# Patient Record
Sex: Female | Born: 1945
Health system: Southern US, Community
[De-identification: ages and names within clinical notes are randomized; demographics above are authoritative.]

## PROBLEM LIST (undated history)

## (undated) DIAGNOSIS — I5042 Chronic combined systolic (congestive) and diastolic (congestive) heart failure: Secondary | ICD-10-CM

## (undated) DIAGNOSIS — G629 Polyneuropathy, unspecified: Secondary | ICD-10-CM

## (undated) DIAGNOSIS — Z8674 Personal history of sudden cardiac arrest: Secondary | ICD-10-CM

## (undated) DIAGNOSIS — Z95810 Presence of automatic (implantable) cardiac defibrillator: Secondary | ICD-10-CM

## (undated) DIAGNOSIS — I10 Essential (primary) hypertension: Secondary | ICD-10-CM

## (undated) DIAGNOSIS — E7800 Pure hypercholesterolemia, unspecified: Secondary | ICD-10-CM

## (undated) DIAGNOSIS — M1990 Unspecified osteoarthritis, unspecified site: Secondary | ICD-10-CM

## (undated) DIAGNOSIS — Q249 Congenital malformation of heart, unspecified: Secondary | ICD-10-CM

## (undated) HISTORY — DX: Chronic combined systolic (congestive) and diastolic (congestive) heart failure: I50.42

---

## 1999-06-16 ENCOUNTER — Encounter: Admission: RE | Admit: 1999-06-16 | Discharge: 1999-06-16 | Payer: Self-pay | Admitting: Obstetrics and Gynecology

## 1999-06-16 ENCOUNTER — Encounter: Payer: Self-pay | Admitting: Obstetrics and Gynecology

## 2000-04-05 HISTORY — PX: CARDIOVASCULAR STRESS TEST: SHX262

## 2000-04-14 ENCOUNTER — Ambulatory Visit (HOSPITAL_COMMUNITY): Admission: RE | Admit: 2000-04-14 | Discharge: 2000-04-14 | Payer: Self-pay | Admitting: Cardiovascular Disease

## 2000-04-14 HISTORY — PX: CARDIAC CATHETERIZATION: SHX172

## 2000-06-23 ENCOUNTER — Encounter: Admission: RE | Admit: 2000-06-23 | Discharge: 2000-06-23 | Payer: Self-pay | Admitting: Obstetrics and Gynecology

## 2000-06-23 ENCOUNTER — Encounter: Payer: Self-pay | Admitting: Obstetrics and Gynecology

## 2000-12-09 ENCOUNTER — Other Ambulatory Visit: Admission: RE | Admit: 2000-12-09 | Discharge: 2000-12-09 | Payer: Self-pay | Admitting: Obstetrics and Gynecology

## 2001-05-13 ENCOUNTER — Encounter: Payer: Self-pay | Admitting: Internal Medicine

## 2001-05-13 ENCOUNTER — Ambulatory Visit (HOSPITAL_COMMUNITY): Admission: RE | Admit: 2001-05-13 | Discharge: 2001-05-13 | Payer: Self-pay | Admitting: Internal Medicine

## 2001-07-15 ENCOUNTER — Encounter: Admission: RE | Admit: 2001-07-15 | Discharge: 2001-07-15 | Payer: Self-pay | Admitting: Gynecology

## 2001-07-15 ENCOUNTER — Encounter: Payer: Self-pay | Admitting: Gynecology

## 2002-02-08 ENCOUNTER — Other Ambulatory Visit: Admission: RE | Admit: 2002-02-08 | Discharge: 2002-02-08 | Payer: Self-pay | Admitting: Gynecology

## 2002-08-18 ENCOUNTER — Encounter: Payer: Self-pay | Admitting: Gynecology

## 2002-08-18 ENCOUNTER — Encounter: Admission: RE | Admit: 2002-08-18 | Discharge: 2002-08-18 | Payer: Self-pay | Admitting: Gynecology

## 2002-10-11 ENCOUNTER — Encounter: Payer: Self-pay | Admitting: Family Medicine

## 2002-10-11 ENCOUNTER — Ambulatory Visit (HOSPITAL_COMMUNITY): Admission: RE | Admit: 2002-10-11 | Discharge: 2002-10-11 | Payer: Self-pay | Admitting: Family Medicine

## 2003-02-21 ENCOUNTER — Other Ambulatory Visit: Admission: RE | Admit: 2003-02-21 | Discharge: 2003-02-21 | Payer: Self-pay | Admitting: Gynecology

## 2003-06-21 ENCOUNTER — Inpatient Hospital Stay (HOSPITAL_COMMUNITY): Admission: EM | Admit: 2003-06-21 | Discharge: 2003-06-28 | Payer: Self-pay | Admitting: Emergency Medicine

## 2003-06-22 ENCOUNTER — Encounter (INDEPENDENT_AMBULATORY_CARE_PROVIDER_SITE_OTHER): Payer: Self-pay | Admitting: *Deleted

## 2003-06-22 HISTORY — PX: CARDIAC CATHETERIZATION: SHX172

## 2003-12-06 ENCOUNTER — Encounter: Admission: RE | Admit: 2003-12-06 | Discharge: 2003-12-06 | Payer: Self-pay | Admitting: Gynecology

## 2004-01-20 DIAGNOSIS — Z8674 Personal history of sudden cardiac arrest: Secondary | ICD-10-CM

## 2004-01-20 HISTORY — DX: Personal history of sudden cardiac arrest: Z86.74

## 2004-02-25 ENCOUNTER — Other Ambulatory Visit: Admission: RE | Admit: 2004-02-25 | Discharge: 2004-02-25 | Payer: Self-pay | Admitting: Gynecology

## 2004-08-24 ENCOUNTER — Emergency Department (HOSPITAL_COMMUNITY): Admission: EM | Admit: 2004-08-24 | Discharge: 2004-08-24 | Payer: Self-pay | Admitting: Emergency Medicine

## 2005-01-15 ENCOUNTER — Encounter: Admission: RE | Admit: 2005-01-15 | Discharge: 2005-01-15 | Payer: Self-pay | Admitting: Gynecology

## 2005-03-17 ENCOUNTER — Other Ambulatory Visit: Admission: RE | Admit: 2005-03-17 | Discharge: 2005-03-17 | Payer: Self-pay | Admitting: Gynecology

## 2006-01-18 ENCOUNTER — Encounter: Admission: RE | Admit: 2006-01-18 | Discharge: 2006-01-18 | Payer: Self-pay | Admitting: Gynecology

## 2006-11-29 ENCOUNTER — Ambulatory Visit (HOSPITAL_COMMUNITY): Admission: RE | Admit: 2006-11-29 | Discharge: 2006-11-29 | Payer: Self-pay | Admitting: Family Medicine

## 2007-02-01 ENCOUNTER — Encounter: Admission: RE | Admit: 2007-02-01 | Discharge: 2007-02-01 | Payer: Self-pay | Admitting: Gynecology

## 2008-02-02 ENCOUNTER — Encounter: Admission: RE | Admit: 2008-02-02 | Discharge: 2008-02-02 | Payer: Self-pay | Admitting: Gynecology

## 2008-07-27 ENCOUNTER — Ambulatory Visit (HOSPITAL_COMMUNITY): Admission: RE | Admit: 2008-07-27 | Discharge: 2008-07-27 | Payer: Self-pay | Admitting: Family Medicine

## 2008-08-20 ENCOUNTER — Encounter: Admission: RE | Admit: 2008-08-20 | Discharge: 2008-08-20 | Payer: Self-pay | Admitting: Orthopedic Surgery

## 2009-02-05 ENCOUNTER — Encounter: Admission: RE | Admit: 2009-02-05 | Discharge: 2009-02-05 | Payer: Self-pay | Admitting: Family Medicine

## 2009-06-03 ENCOUNTER — Ambulatory Visit (HOSPITAL_COMMUNITY): Admission: RE | Admit: 2009-06-03 | Discharge: 2009-06-03 | Payer: Self-pay | Admitting: Family Medicine

## 2010-02-11 ENCOUNTER — Encounter
Admission: RE | Admit: 2010-02-11 | Discharge: 2010-02-11 | Payer: Self-pay | Source: Home / Self Care | Attending: Family Medicine | Admitting: Family Medicine

## 2010-06-06 NOTE — Cardiovascular Report (Signed)
NAMEROSENDA, GEFFRARD NO.:  [id number]   MEDICAL RECORD NO.:  [id number]                   PATIENT TYPE:  INP   LOCATION:  2927                                 FACILITY:  [facility]   PHYSICIAN:  Madaline Savage, M.D.             DATE OF BIRTH:  1945-08-13   DATE OF PROCEDURE:  06/22/2003  DATE OF DISCHARGE:                              CARDIAC CATHETERIZATION   PROCEDURES PERFORMED:  1. Right heart catheterization.  2. Retrograde left heart catheterization.  3. Left ventricular angiography.  4. Thermal dilution cardiac output determinations.  5. Left ventricular angiography.   INTERVENTIONS:  None.   ENTRY SITE:  Right femoral.   DYE USED:  Omnipaque.   8 FRENCH SHEATH:  Venous.   6 FRENCH SHEATH:  Arterial.   PATIENT IDENTIFICATION:  The patient is a 65 year old married African-  American female who was in her usual state of health up until June 21, 2003,  when the patient was at work, developed a witnessed cardiac arrest at work  and was found to be in ventricular fibrillation, was defibrillated twice by  the emergency medical services and transported to the emergency room where  an EKG showed sinus rhythm with sinus tach, left anterior fascicular block  and QS patterns in V1 through V3 without ST segment elevation.  Initial  enzymes were negative, potassium was 2.6, glucose was 232.  The patient is  known to have diabetes, was known to have normal coronary arteries by  cardiac catheterization in 2002, and was said to have an ejection fraction  of 40%.  She was admitted and on June 22, 2003, prior to being transported to  the cardiac cath lab the patient was in her usual state of health without  complaints of shortness of breath, chest pain or ankle edema.  Her potassium  has since normalized and is now 4.2, her creatinine is normal.  This  procedure was performed on an inpatient basis electively without  complications.   RESULTS:   Pressures:  1. Left ventricular pressure 160/15, end-diastolic pressure 25-28, central     aortic pressure 160/70, mean of 110, right atrial pressure mean of 13, A-     wave 17, V-wave 14.  2. Right ventricular pressure 70/11, end-diastolic pressure 15, pulmonary     artery pressure 70/25, mean of 45.  Pulmonary capillary wedge mean     pressure 31, A-wave 32, V-wave 45.  3. FICK cardiac output 4.5, FICK cardiac index 2.0, thermal dilution cardiac     output 4.7, thermal cardiac index 2.1.   ANGIOGRAPHY RESULTS:  The patient's left main coronary artery was short and  normal.   The LAD was the dominant vessel in the circulation, giving rise to a large  bifurcating septal perforator branch, two small to medium-sized diagonal  branches arising from the mid third of the vessel.  After the apical LAD  wraps around the apex, it  gives rise to a posterior descending branch and  is, therefore, the dominant vessel with circulation.   The circumflex gives rise to a bifurcating intermediate ramus branch which  has a 40% stenosis proximally, the circumflex is normal.   The right coronary artery is nondominant and shows no evidence of any  lesions.  The left ventricle is dilated, it is globally hypokinetic, the  ejection fraction is 15%.  There is little to no mitral regurgitation.   FINAL DIAGNOSES:  1. Interval development of severe nonischemic cardiomyopathy since last     cardiac catheterization 2002.  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  2. Trivial single vessel coronary artery disease with a 40% stenosis in the     proximal intermediate ramus as the only lesion.  3. Moderate pulmonary hypertension.  4. Low cardiac output.   PLAN:  Diuresis, digitalization, Ace and beta blockade.  We will obtain an  EP consult for possibility of Bi-V ICD placement.                                               Madaline Savage, M.D.    WHG/MEDQ  D:  06/22/2003  T:  06/23/2003   Job:  [id number]   cc:   Nicki Guadalajara, M.D.  [phone] [address], [address]  [place], [place] [address]  Fax: [phone]   Cardiac Cath Lab   Patrica Duel, M.D.  [address], [address]  [place]  [place] [address]  Fax: [phone]

## 2010-06-06 NOTE — Discharge Summary (Signed)
NAME:  Diana Campbell, Diana Campbell NO.:  [id number]   MEDICAL RECORD NO.:  [id number]                   PATIENT TYPE:  INP   LOCATION:  2910                                 FACILITY:  [facility]   PHYSICIAN:  Diana Campbell, M.D.                  DATE OF BIRTH:  [birth year]   DATE OF ADMISSION:  06/21/2003  DATE OF DISCHARGE:  06/28/2003                                 DISCHARGE SUMMARY   ADMISSION DIAGNOSES:  1. Status post ventricular fibrillation cardiac arrest with subsequent CPR     and defibrillation to normal sinus rhythm.  2. History of cardiac catheterization on April 14, 2000, revealing     essentially normal coronary arteries and normal left ventricular     function.  3. Diabetes mellitus for approximately 20 years.  4. Hypertension.  5. History of hysterectomy.  6. History of cholecystectomy.  7. Obesity.  8. Hypokalemia.   DISCHARGE DIAGNOSES:  1. Status post ventricular fibrillation cardiac arrest with subsequent CPR     and defibrillation to normal sinus rhythm.  2. History of cardiac catheterization on April 14, 2000, revealing     essentially normal coronary arteries and normal left ventricular     function.  3. Diabetes mellitus for approximately 20 years.  4. Hypertension.  5. History of hysterectomy.  6. History of cholecystectomy.  7. Obesity.  8. Hypokalemia.  9. Status post cardiac catheterization on June 22, 2003, by Dr. Lavonne Campbell.     This revealed nonobstructive coronary artery disease. Ejection fraction     was 15% with global hypokinesis.  There is moderate pulmonary     hypertension and low cardiac output.  10.      Status post implantable cardioverter-defibrillator implantation by     Dr. Lewayne Campbell on June 25, 2003.  11.      Urinary tract infection.  12.      Hyperlipidemia.   HISTORY OF PRESENT ILLNESS:  Diana Campbell is a 65 year old African-American  female who presented to [facility] ER on June 21, 2003, after having a  ventricular fibrillation cardiac arrest and subsequent resuscitation with  CPR and defibrillation back to normal sinus rhythm.   She has a history of cardiac catheterization on April 14, 2000, which  revealed normal coronary arteries and normal LV function.  She states she  has been diabetic for approximately 10 years and has been treated for  hypertension for approximately 10 years.  She does not smoke but does use  snuff.  She checks her blood sugars regularly at home and does write them  down but could not remember how they run upon questioning in the emergency  room.   At the time in the emergency room, she could not remember much about the  events of the day. She remembers that she went to work that morning. 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  She  remembers going to work that  morning and remembers taking a break at which time she had some crackers and  a diet Sprite.  She thinks that she then went back to work after her break  but actually could not remember what type of work she was doing or any  interactions that she had since her break time.  She has absolutely no  memory of any episode.  She has no idea what happened to her between the  time she took her break at work until the time EMS brought her in to the  emergency room.  She does not remember feeling ill, feeling any chest pain,  palpitations, lightheadedness, etc.  She absolutely has no recollection of  what happened.  However, per EMS report, they state that the initial blood  sugar level was 233.  They cannot give a full report of exactly who found  her down or how long they thought she may have been down before they found  her.  Nonetheless, she was found by some other personnel at the school and  was found to be pulseless.  Apparently some firemen responded initially, and  CPR was started.  They reported that after CPR was started, she developed  some agonal breathing.  She then required defibrillation x 3 with  conversion  to sinus rhythm.  Sometime around there is when EMS actually arrived.  They  did bring in telemetry strips from where she had ventricular fibrillation  prior to the CPR and defibrillation.   On exam at the time in the ER, her heart rate was 76, blood pressure 167/79.  Lungs were clear.  Heart was in regular rhythm without murmur, rub, or  gallop.  She had no edema.  She had good peripheral pulses, no carotid  bruits.  At that time, she was alert and oriented x 3 but was slightly  confused and very confused about the incident, the episodes of the day and  what had taken place.   Telemetry strips reviewed from 1425 shows ventricular fibrillation.  The EKG  in the emergency room was showing sinus tachycardia at 107 beats per minute,  nonspecific changes, left anterior fascicular block.   Labs at that time included a hemoglobin of 11.8, white count 12.5, platelets  329.  INR 1.0.  Creatinine 0.9, potassium low at 2.6.  Oxygen saturation  95%.  Initial cardiac enzymes negative.   At this point, she was seen and evaluated by Diana Campbell.  He planned  to replete potassium.  We did not know whether her arrhythmia could have  been secondary to electrolyte imbalance or if her potassium was secondary to  defibrillation.  Nevertheless, we would now replete potassium level.  We  would start a low-dose beta blocker.  We would obtain a head CT to rule out  any kind of bleed or hemorrhage.  (We did not know if she fell at the time  of her event.)  As there was no indication of any bleed on CT, we would  start IV heparin.  We planned for elective cardiac catheterization the  following day and EP evaluation for questionable ICD.   CT of the brain reportedly showed stable small left density.  There was  probable old left posterior small infarct.  Reviewed with radiology and  agreed that heparin was safe.  Therefore, heparin was initiated.  HOSPITAL COURSE:  On June 22, 2003, she  remains stable.  EKG showed sinus  rhythm with no  further arrhythmias.  At that time she was stable.  We  planned for cardiac catheterization later that day.   On June 22, 2003, she underwent cardiac catheterization by Dr. Lavonne Campbell.  See the dictated report for details.  She was found to have nonobstructive  CAD.  There was 40% stenosis in the ramus, and otherwise the rest of the  vessel showed no stenosis.  Her EF was 15% with global hypokinesis.  As  well, there was some moderate pulmonary hypertension.  She tolerated  procedure well without complications.  Dr. Elsie Lincoln planned for diuresis,  digoxin, ACE inhibitor, beta blocker.  We would plan for EP consult for  possible ICD.   On June 22, 2003, she was seen in EP evaluation.  It was reviewed that she  had a ventricular fibrillation arrest.  As well, she had a borderline QT and  nonischemic cardiomyopathy with EF approximately 15%, decreased from 60% in  2002.  As well, she had a narrow QRS.  It was also reviewed that there was  no family history of sudden cardiac death.  Because she had a narrow QRS,  this precludes biventricular implant.  Therefore, they planned to proceed  with ICD implantation which would be scheduled for the upcoming Monday.   Over the next couple of days, Ms. Freyre remained stable.  We adjusted  medications for cardiomyopathy.  She had no further arrhythmias and remained  stable.   On June 25, 2003, she underwent ICD implantation by Dr. Lewayne Campbell.  See  his dictated report for detail.  This is a Medtronic device.   Upon followup interrogation, the ICD appeared to be functioning  appropriately, and all values were within normal limits.  Followup chest x-  ray showed no pneumothorax, and device appeared to be in normal position.   Over the next day she remained stable with no significant problems.   On June 28, 2003.  She is felt to be stable for discharge home.  At this  point, she is without complaint.   Her temperature is 97.8, pulse 60s to 70s,  blood pressure 120s/50.  Oxygen saturation 97% on room air.  Magnesium level  is at 2.2.  Potassium 4, creatinine 0.9.  Other labs are stable.  She is  maintaining normal sinus rhythm with no further ventricular arrhythmias.  She has had some PVCs but no runs of NSVT.  At this point, she is thought to  be stable for discharge home.  She has also been seen by Dr. Lewayne Campbell  who deems her stable for discharge home as well.   CONSULTATIONS:  Electrophysiology consultation.  Initially seen by Duke Salvia, M.D. but then later by Doylene Canning. Ladona Ridgel, M.D.  they were consulting  regarding ventricular fibrillation arrest.   PROCEDURES:  1. Cardiac catheterization on June 22, 2003, by Dr. Lavonne Campbell.  This     revealed an obstructive coronary artery disease, EF 15%, with global     hypokinesis.  2. ICD implantation on June 25, 2003, by Dr. Lewayne Campbell.  This is a     Medtronic device.  LABORATORY AND X-RAY DATA:  Head CT on June 21, 2003, shows two small low-  density areas, one of which appears to be stable on the left insula area and  another which again appears to be old but is located in the left posterior  parietal area in the periphery of the grey matter.  No hemorrhage or masses  seen.  Chest x-ray on June 2 shows mild cardiomegaly, generalized peribronchial  thickening, slightly more large on the right than the left.   Chest x-ray June 3 shows poor inspiration with no significant change,  cardiomegaly, pulmonary vascular congestion, and mild chronic interstitial  lung disease.   Chest x-ray June 7 shows 1) AICD in appropriate position.  No evidence of  pneumothorax.  2) No active disease.   Laboratories on admission, white count 12.5, hemoglobin 11.8, hematocrit  36.0, platelets 329.  At that time, INR 1.0, PTT 27, normal.  In the ER at  that time, sodium 139, potassium 2.6 which was repleted, BUN 9, creatinine  0.9.  Cardiac  markers negative.  Followup potassium June 3 at 0024 is 4.4.  Cardiac panel on June 3 shows CK 306, MB 8.1, troponin 0.30.  Next set shows  CK 329, MB 8.1, troponin 0.27.  BNP level 839.0.  TSH normal at 2.2.  Urinalysis shows moderate leukocytes.  She subsequently was started on  ciprofloxacin.  Urine cultures at time of discharge had shown no growth thus  far.  At time of discharge home on June 9, labs showed CBC with white count  11.2, hemoglobin 12.3, hematocrit 37.5, platelets 296.  Sodium 139,  potassium 4.4, glucose 115, BUN 12, creatinine 0.9.  Magnesium 2.2.   DISCHARGE MEDICATIONS:  1. Coreg 3.125 mg twice a day.  2. Captopril 50 mg twice a day.  3. Digoxin 0.25 mg once a day.  4. Lipitor 20 mg once a day.  5. Aldactone 25 mg each morning.  6. Novolin 70/30 as before.  7. Cipro 100 mg twice a day for 3 days.   DISCHARGE INSTRUCTIONS:  1. She was given an ICD instruction sheet by EP, and this was reviewed with     her prior to her discharge.  2. She is to have blood in one week to check a BMET as she will be on     increased doses of ACE and Aldactone.  3. We did call and make an appointment for her to see Dr. Lewayne Campbell back     in the office, and I did write that appointment on her pink piece of     paper, but I did not get it copied over to my copy.  She did have an     appointment made, and she does have his phone number to contact him in     case she has any problems with the ICD, diet, or questions about the ICD.  4. She has an appointment to see Dr. Tresa Endo in the [place] office on     Thursday, June 30, at 2:30 p.m.      Mary B. Easley, P.A.-C.                   Diana Campbell, M.D.    MBE/MEDQ  D:  06/28/2003  T:  06/29/2003  Job:  [id number]   cc:   Doylene Canning. Ladona Ridgel, M.D.   Patrica Duel, M.D.  [address], [address]  [place]  [place] [address]  Fax: [phone]

## 2010-06-06 NOTE — Op Note (Signed)
NAME:  AVILYN, VIRTUE NO.:  [id number]   MEDICAL RECORD NO.:  [id number]                   PATIENT TYPE:  INP   LOCATION:  2927                                 FACILITY:  [facility]   PHYSICIAN:  Doylene Canning. Ladona Ridgel, M.D.               DATE OF BIRTH:  11-01-1945   DATE OF PROCEDURE:  06/25/2003  DATE OF DISCHARGE:                                 OPERATIVE REPORT   PROCEDURE PERFORMED:  Insertion of a single chamber pacemaker.   INDICATIONS FOR PROCEDURE:  Resuscitated VF arrest in the setting of a  nonischemic cardiomyopathy, ejection fraction 15%.   INTRODUCTION:  The patient is a 65 year old woman with a history of a normal  left ventricular function, normal coronary arteries back in March of 2002.  She has a history of diabetes and hypertension.  The patient was admitted to  the hospital after she developed a ventricular fibrillation arrest at work.  She was resuscitated and admitted and underwent evaluation.  The patient  underwent repeat catheterization demonstrating nonobstructive coronary  disease and an ejection fraction of 15% with minimal mitral regurgitation.  She is now referred for implantable cardioverter-defibrillator insertion.  Note that the patient's QRS duration is 129 msec but her heart failure is  presently class 1.   DESCRIPTION OF PROCEDURE:  After informed consent was obtained, the patient  was taken to the diagnostic electrophysiology laboratory in a fasting state.  After the usual preparation and draping, intravenous fentanyl and Midazolam  were given for sedation.  A total of 30 mL of lidocaine was infiltrated into  the left infraclavicular region. A 9 cm incision was carried out over this  region and electrocautery utilized to dissect down to the fascial plane.  The subclavian vein was punctured times one and the Medtronic model 6949 65  cm active fixation defibrillation lead serial number [id number] was advanced  into the right  ventricle.  Mapping was carried out in the right ventricle  and at the final site the R-waves measured 10 mV with a pacing threshold of  0.4 V at 0.5 msec and a pacing impedance of 666 ohms.  10 V pacing did not  stimulate the diaphragm.  With the defibrillation lead in satisfactory  position, it was secured to the subpectoralis fascia with a figure-of-eight  silk suture.  In addition, a sewing sleeve was secured with silk suture.  Electrocautery was utilized to fashion a subcutaneous pocket.  The Medtronic  [place] model O152772, serial [id number] was connected to the defibrillation  lead and placed back in the subcutaneous pocket.  The generator was secured  with a silk suture.  Kanamycin irrigation was utilized to irrigate the  incision and defibrillation threshold testing carried out.  VF was induced  with a T-wave shock.  Initially a 15 joule shock was delivered, which  successfully terminated ventricular fibrillation and restored sinus rhythm.  Five minutes was allowed to elapse  and a second defibrillation threshold  testing carried out.  Again ventricular fibrillation was induced with a T-  wave shock and an 8 joule shock was delivered, terminating ventricular  fibrillation and restoring sinus rhythm.  At this point additional Kanamycin  was utilized to irrigate the incision and the incision then closed with a  layer of 2-0 Vicryl, followed by a layer of 3-0 Vicryl followed by a layer  of 4-0 Vicryl.  Benzoin was painted on the skin.  Steri-Strips were applied.  Pressure dressing placed and the patient returned to her room in  satisfactory condition.   COMPLICATIONS:  There were no immediate procedure complications.   RESULTS:  This demonstrates successful insertion of a Medtronic single  chamber pacemaker in a patient with a spontaneous ventricular fibrillation  arrest with successful resuscitation in the setting of a nonischemic  cardiomyopathy.                                                Doylene Canning. Ladona Ridgel, M.D.    GWT/MEDQ  D:  06/25/2003  T:  06/25/2003  Job:  [id number]   cc:   Patrica Duel, M.D.  [address], [address]  [place]  [place] [address]  Fax: [phone]   Nicki Guadalajara, M.D.  [address]  [place], [place] [address]  Fax: [phone]   Nanetta Batty, M.D.  Fax: [phone]

## 2010-06-06 NOTE — Cardiovascular Report (Signed)
[place]  Patient:    Diana Campbell, Diana Campbell                     MRN: [id number] Proc. Date: 04/14/00 Adm. Date:  [id number] Disc. Date: [id number] Attending:  Virgina Evener CC:         Avie Arenas, M.D.   Cardiac Catheterization  INDICATIONS:  [name] is a 65 year old, black female, with a history of tobacco abuse and has been dipping snuff since less than age 49. She had recently noticed chest discomfort.  She has a history of significant hypertension as well as diabetes mellitus.  A Cardiolite myocardial perfusion study showed mild LV dilation with a suggestion of mild global LV dysfunction with an EF of 41%, and suggestion of borderline infralateral ischemia.  The patient is now referred for definitive cardiac catheterization.  PROCEDURES PERFORMED:  Left heart catheterization, cine coronary angiography, and distal aortography.  ANGIOGRAPHIC DATA:  Left main coronary artery:  The left main coronary artery was angiographically normal and bifurcated into an LAD and large circumflex system.  Left anterior descending:  The LAD was a large vessel that wrapped around the apex.  There appeared to be a normal variant in that the PDA and PLA system seemed to rise from the apical portion of the LAD and seemed to extend to the acute margin.  There was no significant obstruction.  Circumflex artery:  The circumflex coronary artery gave rise to a very high marginal vessel that had a ramus intermediate like distribution.  The circumflex and its marginal vessel was angiographically normal.  Right coronary artery:  The right coronary artery was a nondominant vessel. This vessel gave rise to a small anterior RV marginal branch and then seemed to end in an acute marginal branch.  Several views did not seem to demonstrate any occlusion of the RCA, but there were several branches off the anterior marginal which seemed to  supple the lateral wall.  As noted above, the PDA and PLA system seemed to arise from the LAD and left coronary system.  Biplane cinearteriography revealed a mild global hypocontractility.  There was a suggestion of subtle apical contractility, calculated ejection fraction, however, measured at 69% which seemed increased from the visual appearance which was more in the 50 to less than 55% range.  There was normal contractility seen on the LAO ventriculogram.  Because of the patients significant hypertensive history, distal aortography was performed.  This did not demonstrate any evidence for significant renovascular or aortoiliac disease.  IMPRESSION: 1. Low-normal left ventricular function. 2. Normal coronary arteries with a variant in that the posterior descending    artery and posterolateral artery system seemed to rise from the apical    portion of the left anterior descending. 3. Nondominant right coronary artery. DD:  04/14/00 TD:  04/15/00 Job: [id number]

## 2010-07-20 DIAGNOSIS — Z95810 Presence of automatic (implantable) cardiac defibrillator: Secondary | ICD-10-CM

## 2010-07-20 HISTORY — DX: Presence of automatic (implantable) cardiac defibrillator: Z95.810

## 2010-07-21 ENCOUNTER — Ambulatory Visit (HOSPITAL_COMMUNITY)
Admission: RE | Admit: 2010-07-21 | Discharge: 2010-07-22 | Disposition: A | Discharge status: Home / Self Care | Payer: Medicare Other | Source: Ambulatory Visit | Attending: Cardiovascular Disease | Admitting: Cardiovascular Disease

## 2010-07-21 ENCOUNTER — Ambulatory Visit (HOSPITAL_COMMUNITY)
Admission: RE | Admit: 2010-07-21 | Discharge: 2010-07-21 | Disposition: A | Discharge status: Home / Self Care | Payer: Medicare Other | Source: Ambulatory Visit | Attending: Cardiovascular Disease | Admitting: Cardiovascular Disease

## 2010-07-21 ENCOUNTER — Other Ambulatory Visit (HOSPITAL_COMMUNITY): Payer: Self-pay | Admitting: Cardiovascular Disease

## 2010-07-21 DIAGNOSIS — I428 Other cardiomyopathies: Secondary | ICD-10-CM | POA: Insufficient documentation

## 2010-07-21 DIAGNOSIS — I499 Cardiac arrhythmia, unspecified: Secondary | ICD-10-CM

## 2010-07-21 DIAGNOSIS — I2510 Atherosclerotic heart disease of native coronary artery without angina pectoris: Secondary | ICD-10-CM | POA: Insufficient documentation

## 2010-07-21 DIAGNOSIS — Y831 Surgical operation with implant of artificial internal device as the cause of abnormal reaction of the patient, or of later complication, without mention of misadventure at the time of the procedure: Secondary | ICD-10-CM | POA: Insufficient documentation

## 2010-07-21 DIAGNOSIS — Z8674 Personal history of sudden cardiac arrest: Secondary | ICD-10-CM | POA: Insufficient documentation

## 2010-07-21 DIAGNOSIS — E785 Hyperlipidemia, unspecified: Secondary | ICD-10-CM | POA: Insufficient documentation

## 2010-07-21 DIAGNOSIS — T82198A Other mechanical complication of other cardiac electronic device, initial encounter: Secondary | ICD-10-CM | POA: Insufficient documentation

## 2010-07-21 DIAGNOSIS — I10 Essential (primary) hypertension: Secondary | ICD-10-CM | POA: Insufficient documentation

## 2010-07-21 DIAGNOSIS — Z794 Long term (current) use of insulin: Secondary | ICD-10-CM | POA: Insufficient documentation

## 2010-07-21 DIAGNOSIS — E119 Type 2 diabetes mellitus without complications: Secondary | ICD-10-CM | POA: Insufficient documentation

## 2010-07-21 HISTORY — PX: CARDIAC DEFIBRILLATOR PLACEMENT: SHX171

## 2010-07-21 LAB — BASIC METABOLIC PANEL
BUN: 22 mg/dL (ref 6–23)
Calcium: 9.8 mg/dL (ref 8.4–10.5)
Creatinine, Ser: 1.11 mg/dL — ABNORMAL HIGH (ref 0.50–1.10)
GFR calc Af Amer: 60 mL/min — ABNORMAL LOW (ref >60)
GFR calc non Af Amer: 49 mL/min — ABNORMAL LOW (ref >60)

## 2010-07-21 LAB — CBC
MCHC: 32.9 g/dL (ref 30.0–36.0)
Platelets: 310 10*3/uL (ref 150–400)
RDW: 14.1 % (ref 11.5–15.5)

## 2010-07-21 LAB — HEMOGLOBIN A1C
Hgb A1c MFr Bld: 8.2 % — ABNORMAL HIGH (ref <5.7)
Mean Plasma Glucose: 189 mg/dL — ABNORMAL HIGH (ref <117)

## 2010-07-21 LAB — GLUCOSE, CAPILLARY
Glucose-Capillary: 238 mg/dL — ABNORMAL HIGH (ref 70–99)
Glucose-Capillary: 202 mg/dL — ABNORMAL HIGH (ref 70–99)

## 2010-07-21 LAB — SURGICAL PCR SCREEN: MRSA, PCR: NEGATIVE

## 2010-07-22 ENCOUNTER — Ambulatory Visit (HOSPITAL_COMMUNITY): Payer: Medicare Other

## 2010-07-22 LAB — GLUCOSE, CAPILLARY: Glucose-Capillary: 171 mg/dL — ABNORMAL HIGH (ref 70–99)

## 2010-07-26 NOTE — Discharge Summary (Signed)
  Diana Campbell, SENEGAL NO.:  [id number]  MEDICAL RECORD NO.:  [id number]  LOCATION:                                 FACILITY:  PHYSICIAN:  Thurmon Fair, MD     DATE OF BIRTH:  [birth year]  DATE OF ADMISSION:  07/21/2010 DATE OF DISCHARGE:  07/22/2010                              DISCHARGE SUMMARY   DISCHARGE DIAGNOSES: 1. ICD change this admission. 2. Ventricular fibrillation arrest in 2005 treated with an ICD with     Sprint Fidelis lead. 3. Nonischemic cardiomyopathy in 2005 with an ejection fraction of     less than 20%, subsequent echocardiogram March 2011 showing normal     left ventricular function. 4. Mild coronary disease in 2005 with a 40% circumflex stenosis. 5. Type 2 insulin-dependent diabetes. 6. Treated hypertension. 7. Treated dyslipidemia.  HOSPITAL COURSE:  The patient is a 65 year old female who was seen in the office by Dr. Royann Shivers after she called saying that her ICD was beeping.  Dr. Royann Shivers evaluated her in the office.  She had a VF arrest in 2005.  She was found to have a nonischemic cardiomyopathy at that time.  She had an ICD implanted then with a Sprint Fidelis class I advisory [phone] lead.  She has not had any ICD shocks.  She has had no recurrent syncope.  Followup ICD checks have been normal.  Dr. Royann Shivers felt we should go ahead and change her ICD out.  She was admitted electively July 21, 2010.  She did undergo generator change with a Medtronic device as well as a lead change.  The original Sprint Fidelis lead was capped. Postprocedure she did well.  She had a normal device function.  Chest x- ray showed no pneumothorax.  She does have a small hematoma at the ICD site.  She will see Dr. Royann Shivers in a week in the office in followup.  LABORATORY DATA:  Sodium 138, potassium 4.6, BUN 22, creatinine 1.11, hemoglobin A1c is 8.2.  TSH 1.77, INR 1.03, white count 11.2, hemoglobin 11.3, hematocrit 34.3, platelets 310,000.  Chest  x-ray shows mild cardiomegaly, no pneumothorax.  Telemetry is paced.  DISPOSITION:  The patient is discharged in stable condition.  She will follow up with Dr. Royann Shivers in 1 week for a site check.  Please refer to med rec for complete discharge medications.  She knows to contact us if her hematoma expands or if she develops any drainage from pacer site.     Abelino Derrick, P.A.   ______________________________ Thurmon Fair, MD    LKK/MEDQ  D:  07/22/2010  T:  07/22/2010  Job:  [id number]  cc:   Thurmon Fair, MD [facility]  Electronically Signed by Corine Shelter P.A. on 07/24/2010 12:01:29 PM Electronically Signed by Thurmon Fair M.D. on 07/26/2010 08:19:03 AM

## 2010-07-26 NOTE — Op Note (Signed)
Diana Campbell, Diana Campbell NO.:  [id number]  MEDICAL RECORD NO.:  [id number]  LOCATION:  6529                         FACILITY:  [facility]  PHYSICIAN:  Thurmon Fair, MD     DATE OF BIRTH:  03/13/1945  DATE OF PROCEDURE: DATE OF DISCHARGE:                              OPERATIVE REPORT   PROCEDURES PERFORMED: 1. Removal of old defibrillator generator. 2. Capping of chronic right ventricular defibrillator lead. 3. Implantation of new dual-chamber defibrillator system (new atrial     and ventricular leads and new generator).  Initial lead and     generator testing and defibrillation threshold testing. 4. Moderate sedation. 5. Fluoroscopy. 6. Left upper extremity venogram.  REASON FOR PROCEDURE: 1. History of previous cardiac arrest (initial implantation of     defibrillator was for secondary prevention). 2. Nonischemic cardiomyopathy. 3. Fractured chronic defibrillator lead (Medtronic Sprint Fidelis     lead, subject to recall).  Diana Campbell is a 65 year old woman who survived sudden cardiac arrest and subsequently had implantation of a single-chamber defibrillator. She has a Insurance account manager Fidelis lead, subject to recall.  Low impedance alerts alarmed and when the patient presented to the office the impedance was greater than 3000 ohms.  Tachycardia detection and therapies were turned off and the patient was referred for implantation of a new defibrillator system.  Diana Campbell is now older and has developed significant bradycardia partly due to necessary medications for treatment of cardiomyopathy and hypertension.  She now meets criteria for implantation of a dual-chamber.  PROCEDURE PERFORMED:  By Thurmon Fair, MD  ASSISTANT:  Derrick Ravel.  MEDICATIONS ADMINISTERED:  Ancef 1 g intravenously, Versed 5 mg intravenously, fentanyl 200 mcg intravenously, lidocaine 1% 30 mL locally, and Romazicon 0.5 mg intravenously.  DEVICE DETAILS:  The chronic  explanted generator was a Medtronic Maximo VR, serial number [id number], implanted June 25, 2003.  The chronic abandoned right ventricular lead is a Medtronic Sprint Fidelis  [id number], serial number [id number] implanted on the same date.  The new defibrillator generator is a Medtronic Protecta [id number], serial number [id number].  The new atrial lead is a Medtronic 5076 - 52 cm, serial number [id number].  The new defibrillator lead is a Oncologist DXAC - DSP, model number 6947 - 65 cm, serial number [id number].  COMPLICATIONS:  None.  The patient did require partial reversal of sedation following defibrillation threshold testing.  ESTIMATED BLOOD LOSS:  Less than 10 mL.  The risks and benefits of the procedure were discussed, the patient provided informed consent and was brought to the cardiac cath lab in the fasting state.  She was prepped and draped in usual sterile fashion and local anesthesia 1% lidocaine was administered to the left prepectoral area in the area of the previous scar.  A 6-cm horizontal incision was made parallel with the inferior border of the left clavicle and using limited electrocautery and mostly blunt dissection, the pocket was opened and the chronic generator was explanted.  The defibrillator leads were capped.  The chronic defibrillator lead tips were capped.  Under fluoroscopic guidance using two separate venipunctures and the modified Seldinger technique, two  separate J-tipped guidewires were placed in left subclavian vein.  These were then exchanged for two separate SafeSheaths 8-French and 7-French respectively.  Under fluoroscopic guidance, the ventricular lead was advanced to the level of the mid to apical right ventricular septum.  The active fixation helix was deployed.  There was moderate to prominent current of injury.  Satisfactory sensing and pacing parameters were recorded. There was no evidence of diaphragmatic  stimulation at maximum device output.  The SafeSheath was peeled away and the lead was secured in place using 2-0 silk.  The atrial lead was advanced to the level of the right atrial appendage and the active fixation helix was deployed.  Prominent current of injury was seen.  There was no evidence of diaphragmatic stimulation at maximum device output.  Sensing and pacing threshold were satisfactory.  The SafeSheath was peeled away.  The lead was secured in place using 2-0 silk.  The pocket was flushed with copious amounts of antibiotic solution and reinspected for hemostasis which was found to be excellent.  The new leads were connected to the new generator.  Appropriate ventricular pacing and atrial paced/ventricular sensed rhythm were noted.  The generator was then placed in the pocket with care being taken that the entire system assume a comfortable position without tension on the incision margins.  The chronic defibrillator capped leads as well as the new leads were coiled behind the generator.  The pocket was then closed in layers using two layers of 2-0 silk and cutaneous staples.  A sterile dressing was applied.  Defibrillation threshold testing was then performed.  Ventricular fibrillation was successfully induced with a single 1.2 joules shock-on- T procedure.  The device showed appropriate detection without any dropouts.  The charge time was only 1.8 seconds.  A 10-joule shock was delivered and converted the rhythm immediately to a normal sinus rhythm. The total duration of fibrillation was 5 seconds.  The emergent rhythm was actually atrial paced ventricular sensed.  A small amount of Romazicon was administered due to excessive sedation following defibrillation testing.  The patient was immediately alert, oriented and never did show significant hypoxia.  At the end of the procedure, the following electronic parameters were encountered:  Right atrium sensed P-waves 2.1  mV, impedance 513 ohms, threshold 1 volt at 0.4 milliseconds pulse width.  Right ventricle sensed R-waves 9.6 mV, impedance 589 ohms, threshold 0.75 volts at 0.4 milliseconds pulse width.  High-voltage impedance was 47 ohms for the distal coil and 56 ohms for the proximal coil.     Thurmon Fair, MD     MC/MEDQ  D:  07/21/2010  T:  07/22/2010  Job:  [id number]  cc:   [facility]  Electronically Signed by Thurmon Fair M.D. on 07/26/2010 08:18:52 AM

## 2011-01-15 ENCOUNTER — Other Ambulatory Visit: Payer: Self-pay | Admitting: Family Medicine

## 2011-01-15 DIAGNOSIS — Z1231 Encounter for screening mammogram for malignant neoplasm of breast: Secondary | ICD-10-CM

## 2011-02-18 ENCOUNTER — Ambulatory Visit
Admission: RE | Admit: 2011-02-18 | Discharge: 2011-02-18 | Disposition: A | Discharge status: Home / Self Care | Payer: Medicare Other | Source: Ambulatory Visit | Attending: Family Medicine | Admitting: Family Medicine

## 2011-02-18 DIAGNOSIS — Z1231 Encounter for screening mammogram for malignant neoplasm of breast: Secondary | ICD-10-CM

## 2011-03-03 ENCOUNTER — Other Ambulatory Visit (HOSPITAL_COMMUNITY): Payer: Self-pay | Admitting: Family Medicine

## 2011-03-03 DIAGNOSIS — Z139 Encounter for screening, unspecified: Secondary | ICD-10-CM

## 2011-03-10 ENCOUNTER — Ambulatory Visit (HOSPITAL_COMMUNITY)
Admission: RE | Admit: 2011-03-10 | Discharge: 2011-03-10 | Disposition: A | Discharge status: Home / Self Care | Payer: Medicare Other | Source: Ambulatory Visit | Attending: Family Medicine | Admitting: Family Medicine

## 2011-03-10 DIAGNOSIS — M899 Disorder of bone, unspecified: Secondary | ICD-10-CM | POA: Insufficient documentation

## 2011-03-10 DIAGNOSIS — Z780 Asymptomatic menopausal state: Secondary | ICD-10-CM | POA: Insufficient documentation

## 2011-03-10 DIAGNOSIS — Z139 Encounter for screening, unspecified: Secondary | ICD-10-CM

## 2011-03-10 DIAGNOSIS — Z13820 Encounter for screening for osteoporosis: Secondary | ICD-10-CM | POA: Insufficient documentation

## 2011-03-12 DIAGNOSIS — E119 Type 2 diabetes mellitus without complications: Secondary | ICD-10-CM | POA: Insufficient documentation

## 2011-03-19 NOTE — H&P (Signed)
  NTS SOAP Note  Vital Signs:  Vitals as of: 03/19/2011: Systolic 113: Diastolic 57: Heart Rate 71: Temp 96.53F: Height 21ft 7.5in: Weight 213Lbs 0 Ounces: OFC 0in: Respiratory Rate 0: O2 Saturation 0: Pain Level 0: BMI 33  BMI : 32.87 kg/m2  Subjective: This 66 Years 9 Months old Female presents forscreening TCS.  Denies GI complaints.  No family h/o colon cancer.  Review of Symptoms:  Constitutional:unremarkable Head:unremarkable Eyes:unremarkable sinus Cardiovascular:unremarkable Respiratory:unremarkable Gastrointestinal:unremarkable Genitourinary:unremarkable Musculoskeletal:unremarkable Skin:unremarkable Hematolgic/Lymphatic:unremarkable Allergic/Immunologic:unremarkable   Past Medical History:Reviewed   Past Medical History  Surgical History: defibrillator placement, hysterectomy, cholecystectomy Medical Problems:  Diabetes, High Blood pressure, High cholesterol Allergies: nkda Medications: selsun, baby ASA, captopril, lasix, atorvastatin, januvia, coreg   Social History:Reviewed   Social History  Preferred Language: English ([place]) Race:  Black or African American Ethnicity: Not Hispanic / Latino Age: 66 Years 3 Months Marital Status:  M   Smoking Status: Unknown if ever smoked  Family History:Reviewed   Family History  Is there a family history of:no family h/o colon cancer    Objective Information: General:Well appearing, well nourished in no distress. Heart:RRR, no murmur or gallop.  Normal S1, S2.  No S3, S4.  Lungs:CTA bilaterally, no wheezes, rhonchi, rales.  Breathing unlabored. Abdomen:Soft, NT/ND, no HSM, no masses. deferred to procedure  Assessment:Need for screening TCS  Diagnosis &amp; Procedure: DiagnosisCode: V76.51, ProcedureCode: 16109,    Plan:Scheduled for TCS on 03/30/11.   Patient Education:Alternative treatments to surgery were discussed with  patient (and family).Risks and benefits  of procedure were fully explained to the patient (and family) who gave informed consent. Patient/family questions were addressed.  Follow-up:Pending Surgery

## 2011-03-23 ENCOUNTER — Other Ambulatory Visit: Payer: Self-pay

## 2011-03-23 ENCOUNTER — Emergency Department (HOSPITAL_COMMUNITY): Payer: Medicare Other

## 2011-03-23 ENCOUNTER — Emergency Department (HOSPITAL_COMMUNITY)
Admission: EM | Admit: 2011-03-23 | Discharge: 2011-03-23 | Disposition: A | Discharge status: Home / Self Care | Payer: Medicare Other | Attending: Emergency Medicine | Admitting: Emergency Medicine

## 2011-03-23 ENCOUNTER — Encounter (HOSPITAL_COMMUNITY): Payer: Self-pay | Admitting: Emergency Medicine

## 2011-03-23 DIAGNOSIS — M545 Low back pain, unspecified: Secondary | ICD-10-CM | POA: Insufficient documentation

## 2011-03-23 DIAGNOSIS — N289 Disorder of kidney and ureter, unspecified: Secondary | ICD-10-CM

## 2011-03-23 DIAGNOSIS — S301XXA Contusion of abdominal wall, initial encounter: Secondary | ICD-10-CM | POA: Insufficient documentation

## 2011-03-23 DIAGNOSIS — Y92410 Unspecified street and highway as the place of occurrence of the external cause: Secondary | ICD-10-CM | POA: Insufficient documentation

## 2011-03-23 DIAGNOSIS — R42 Dizziness and giddiness: Secondary | ICD-10-CM | POA: Insufficient documentation

## 2011-03-23 DIAGNOSIS — I2510 Atherosclerotic heart disease of native coronary artery without angina pectoris: Secondary | ICD-10-CM | POA: Insufficient documentation

## 2011-03-23 DIAGNOSIS — R079 Chest pain, unspecified: Secondary | ICD-10-CM | POA: Insufficient documentation

## 2011-03-23 DIAGNOSIS — S39012A Strain of muscle, fascia and tendon of lower back, initial encounter: Secondary | ICD-10-CM

## 2011-03-23 DIAGNOSIS — S20219A Contusion of unspecified front wall of thorax, initial encounter: Secondary | ICD-10-CM | POA: Insufficient documentation

## 2011-03-23 DIAGNOSIS — I517 Cardiomegaly: Secondary | ICD-10-CM | POA: Insufficient documentation

## 2011-03-23 DIAGNOSIS — R109 Unspecified abdominal pain: Secondary | ICD-10-CM | POA: Insufficient documentation

## 2011-03-23 DIAGNOSIS — S339XXA Sprain of unspecified parts of lumbar spine and pelvis, initial encounter: Secondary | ICD-10-CM | POA: Insufficient documentation

## 2011-03-23 DIAGNOSIS — K449 Diaphragmatic hernia without obstruction or gangrene: Secondary | ICD-10-CM | POA: Insufficient documentation

## 2011-03-23 HISTORY — DX: Essential (primary) hypertension: I10

## 2011-03-23 HISTORY — DX: Pure hypercholesterolemia, unspecified: E78.00

## 2011-03-23 HISTORY — DX: Congenital malformation of heart, unspecified: Q24.9

## 2011-03-23 LAB — URINALYSIS, MICROSCOPIC ONLY
Bilirubin Urine: NEGATIVE
Glucose, UA: NEGATIVE mg/dL
Hgb urine dipstick: NEGATIVE
Ketones, ur: NEGATIVE mg/dL
Protein, ur: NEGATIVE mg/dL

## 2011-03-23 LAB — COMPREHENSIVE METABOLIC PANEL
ALT: 11 U/L (ref 0–35)
AST: 17 U/L (ref 0–37)
Alkaline Phosphatase: 87 U/L (ref 39–117)
CO2: 28 mEq/L (ref 19–32)
Calcium: 11.3 mg/dL — ABNORMAL HIGH (ref 8.4–10.5)
Potassium: 4.4 mEq/L (ref 3.5–5.1)
Sodium: 138 mEq/L (ref 135–145)
Total Protein: 8.3 g/dL (ref 6.0–8.3)

## 2011-03-23 LAB — CBC
HCT: 36.3 % (ref 36.0–46.0)
Hemoglobin: 11.5 g/dL — ABNORMAL LOW (ref 12.0–15.0)
MCH: 24.5 pg — ABNORMAL LOW (ref 26.0–34.0)
MCHC: 31.7 g/dL (ref 30.0–36.0)
RDW: 14.4 % (ref 11.5–15.5)

## 2011-03-23 LAB — POCT I-STAT, CHEM 8
Calcium, Ion: 1.32 mmol/L (ref 1.12–1.32)
Creatinine, Ser: 1.4 mg/dL — ABNORMAL HIGH (ref 0.50–1.10)
Glucose, Bld: 105 mg/dL — ABNORMAL HIGH (ref 70–99)
Hemoglobin: 12.9 g/dL (ref 12.0–15.0)
Potassium: 4.4 mEq/L (ref 3.5–5.1)
TCO2: 28 mmol/L (ref 0–100)

## 2011-03-23 LAB — PROTIME-INR
INR: 1.05 (ref 0.00–1.49)
Prothrombin Time: 13.9 seconds (ref 11.6–15.2)

## 2011-03-23 LAB — LACTIC ACID, PLASMA: Lactic Acid, Venous: 1 mmol/L (ref 0.5–2.2)

## 2011-03-23 MED ORDER — IOHEXOL 300 MG/ML  SOLN
80.0000 mL | Freq: Once | INTRAMUSCULAR | Status: AC | PRN
  Administered 2011-03-23: 80 mL via INTRAVENOUS

## 2011-03-23 MED ORDER — SODIUM CHLORIDE 0.9 % IV SOLN
Freq: Once | INTRAVENOUS | Status: AC
  Administered 2011-03-23: 15:00:00 via INTRAVENOUS

## 2011-03-23 MED ORDER — MORPHINE SULFATE 4 MG/ML IJ SOLN
4.0000 mg | Freq: Once | INTRAMUSCULAR | Status: AC
  Administered 2011-03-23: 4 mg via INTRAVENOUS
  Filled 2011-03-23: qty 1

## 2011-03-23 MED ORDER — ONDANSETRON HCL 4 MG/2ML IJ SOLN
4.0000 mg | Freq: Once | INTRAMUSCULAR | Status: AC
  Administered 2011-03-23: 4 mg via INTRAVENOUS
  Filled 2011-03-23: qty 2

## 2011-03-23 MED ORDER — OXYCODONE-ACETAMINOPHEN 5-325 MG PO TABS: 1.0000 | ORAL_TABLET | ORAL | Status: AC | PRN

## 2011-03-23 NOTE — ED Notes (Signed)
Portable chest xray performed. 
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
## 2011-03-23 NOTE — ED Notes (Signed)
Pt sitting up eating crackers and drinking, waiting for family to bring clothes.  No complaints voiced.
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
## 2011-03-23 NOTE — ED Notes (Signed)
Family (granddaughter) at beside. Family given emotional support.

## 2011-03-23 NOTE — ED Notes (Signed)
Pt resting with no complaints st's pain is the same but she feels better,  Pt remains alert and oriented x's 3, family at bedside.  Pt requesting something to drink explained importance of waiting for CT and x-ray results.  Pt verbalizes understanding.

## 2011-03-27 MED ORDER — SODIUM CHLORIDE 0.45 % IV SOLN
Freq: Once | INTRAVENOUS | Status: AC
  Administered 2011-03-30: 07:00:00 via INTRAVENOUS

## 2011-03-30 ENCOUNTER — Ambulatory Visit (HOSPITAL_COMMUNITY)
Admission: RE | Admit: 2011-03-30 | Discharge: 2011-03-30 | Disposition: A | Discharge status: Home / Self Care | Payer: Medicare Other | Source: Ambulatory Visit | Attending: General Surgery | Admitting: General Surgery

## 2011-03-30 ENCOUNTER — Encounter (HOSPITAL_COMMUNITY)
Admission: RE | Disposition: A | Discharge status: Home / Self Care | Payer: Self-pay | Source: Ambulatory Visit | Attending: General Surgery

## 2011-03-30 DIAGNOSIS — I10 Essential (primary) hypertension: Secondary | ICD-10-CM | POA: Insufficient documentation

## 2011-03-30 DIAGNOSIS — Z1211 Encounter for screening for malignant neoplasm of colon: Secondary | ICD-10-CM | POA: Insufficient documentation

## 2011-03-30 DIAGNOSIS — Z7982 Long term (current) use of aspirin: Secondary | ICD-10-CM | POA: Insufficient documentation

## 2011-03-30 DIAGNOSIS — E119 Type 2 diabetes mellitus without complications: Secondary | ICD-10-CM | POA: Insufficient documentation

## 2011-03-30 DIAGNOSIS — Z79899 Other long term (current) drug therapy: Secondary | ICD-10-CM | POA: Insufficient documentation

## 2011-03-30 DIAGNOSIS — E7800 Pure hypercholesterolemia, unspecified: Secondary | ICD-10-CM | POA: Insufficient documentation

## 2011-03-30 HISTORY — PX: COLONOSCOPY: SHX5424

## 2011-03-30 SURGERY — COLONOSCOPY
Anesthesia: Moderate Sedation

## 2011-03-30 MED ORDER — MIDAZOLAM HCL 5 MG/5ML IJ SOLN
INTRAMUSCULAR | Status: DC | PRN
  Administered 2011-03-30: 2 mg via INTRAVENOUS

## 2011-03-30 MED ORDER — MEPERIDINE HCL 25 MG/ML IJ SOLN
INTRAMUSCULAR | Status: DC | PRN
  Administered 2011-03-30: 50 mg via INTRAVENOUS

## 2011-03-30 MED ORDER — STERILE WATER FOR IRRIGATION IR SOLN
Status: DC | PRN
  Administered 2011-03-30: 08:00:00

## 2011-03-30 MED ORDER — MEPERIDINE HCL 100 MG/ML IJ SOLN
INTRAMUSCULAR | Status: AC
  Filled 2011-03-30: qty 2

## 2011-03-30 MED ORDER — MIDAZOLAM HCL 5 MG/5ML IJ SOLN
INTRAMUSCULAR | Status: AC
  Filled 2011-03-30: qty 10

## 2011-03-30 NOTE — Discharge Instructions (Signed)
Colonoscopy Care After Read the instructions outlined below and refer to this sheet in the next few weeks. These discharge instructions provide you with general information on caring for yourself after you leave the hospital. Your doctor may also give you specific instructions. While your treatment has been planned according to the most current medical practices available, unavoidable complications occasionally occur. If you have any problems or questions after discharge, call your doctor. HOME CARE INSTRUCTIONS ACTIVITY:  You may resume your regular activity, but move at a slower pace for the next 24 hours.   Take frequent rest periods for the next 24 hours.   Walking will help get rid of the air and reduce the bloated feeling in your belly (abdomen).   No driving for 24 hours (because of the medicine (anesthesia) used during the test).   You may shower.   Do not sign any important legal documents or operate any machinery for 24 hours (because of the anesthesia used during the test).  NUTRITION:  Drink plenty of fluids.   You may resume your normal diet as instructed by your doctor.   Begin with a light meal and progress to your normal diet. Heavy or fried foods are harder to digest and may make you feel sick to your stomach (nauseated).   Avoid alcoholic beverages for 24 hours or as instructed.  MEDICATIONS:  You may resume your normal medications unless your doctor tells you otherwise.  WHAT TO EXPECT TODAY:  Some feelings of bloating in the abdomen.   Passage of more gas than usual.   Spotting of blood in your stool or on the toilet paper.  IF YOU HAD POLYPS REMOVED DURING THE COLONOSCOPY:  No aspirin products for 7 days or as instructed.   No alcohol for 7 days or as instructed.   Eat a soft diet for the next 24 hours.  FINDING OUT THE RESULTS OF YOUR TEST Not all test results are available during your visit. If your test results are not back during the visit, make an  appointment with your caregiver to find out the results. Do not assume everything is normal if you have not heard from your caregiver or the medical facility. It is important for you to follow up on all of your test results.  SEEK IMMEDIATE MEDICAL CARE IF:  You have more than a spotting of blood in your stool.   Your belly is swollen (abdominal distention).   You are nauseated or vomiting.   You have a fever.   You have abdominal pain or discomfort that is severe or gets worse throughout the day.  Document Released: 08/20/2003 Document Revised: 12/25/2010 Document Reviewed: 08/18/2007 [employer] Patient Information 2012 [employer]. 

## 2011-03-30 NOTE — Interval H&P Note (Signed)
History and Physical Interval Note:  03/30/2011 7:24 AM  Diana Campbell  has presented today for surgery, with the diagnosis of Special screening for malignant neoplasms, colon   The various methods of treatment have been discussed with the patient and family. After consideration of risks, benefits and other options for treatment, the patient has consented to  Procedure(s) (LRB): COLONOSCOPY (N/A) as a surgical intervention .  The patients' history has been reviewed, patient examined, no change in status, stable for surgery.  I have reviewed the patients' chart and labs.  Questions were answered to the patient's satisfaction.     Franky Macho A

## 2011-03-30 NOTE — Op Note (Signed)
[facility] [address] [place], [place]  [address]  COLONOSCOPY PROCEDURE REPORT  PATIENT:  Diana Campbell, Diana Campbell  MR#:  [id number] BIRTHDATE:  1945-11-09, 65 yrs. old  GENDER:  female ENDOSCOPIST:  Franky Macho, MD REF. BY:  Assunta Found, M.D. PROCEDURE DATE:  03/30/2011 PROCEDURE:  Average-risk screening colonoscopy G0121 ASA CLASS:  Class II INDICATIONS:  Screening MEDICATIONS:   Versed 3 mg IV, demerol 50 mg IV  DESCRIPTION OF PROCEDURE:   After the risks benefits and alternatives of the procedure were thoroughly explained, informed consent was obtained.  Digital rectal exam was performed and revealed small external hemorrhoids.   The [id number] ([id number]) endoscope was introduced through the anus and advanced to the cecum, which was identified by both the appendix and ileocecal valve, without limitations.  The quality of the prep was adequate..  The instrument was then slowly withdrawn as the colon was fully examined.  FINDINGS:  A normal appearing cecum, ileocecal valve, and appendiceal orifice were identified. The ascending, hepatic flexure, transverse, splenic flexure, descending, sigmoid colon, and rectum appeared unremarkable.   Retroflexed views in the rectum revealed it was not tolerated by the patient.     The scope was then withdrawn from the cecum and the procedure completed. COMPLICATIONS:  None ENDOSCOPIC IMPRESSION: 1) Normal colon  RECOMMENDATIONS:  REPEAT EXAM:  In 10 year(s) for Colonoscopy.  ______________________________ Franky Macho, MD  CC:  Assunta Found, MD  n. Rosalie DoctorFranky Macho at 03/30/2011 07:56 AM  Ilda Mori, [phone]

## 2011-04-02 ENCOUNTER — Encounter (HOSPITAL_COMMUNITY): Payer: Self-pay | Admitting: General Surgery

## 2011-06-12 ENCOUNTER — Encounter (HOSPITAL_COMMUNITY): Payer: Self-pay

## 2011-06-12 ENCOUNTER — Emergency Department (HOSPITAL_COMMUNITY): Payer: Medicare Other

## 2011-06-12 ENCOUNTER — Emergency Department (HOSPITAL_COMMUNITY)
Admission: EM | Admit: 2011-06-12 | Discharge: 2011-06-12 | Disposition: A | Discharge status: Home / Self Care | Payer: Medicare Other | Attending: Emergency Medicine | Admitting: Emergency Medicine

## 2011-06-12 DIAGNOSIS — I10 Essential (primary) hypertension: Secondary | ICD-10-CM | POA: Insufficient documentation

## 2011-06-12 DIAGNOSIS — R42 Dizziness and giddiness: Secondary | ICD-10-CM | POA: Insufficient documentation

## 2011-06-12 DIAGNOSIS — Z95810 Presence of automatic (implantable) cardiac defibrillator: Secondary | ICD-10-CM | POA: Insufficient documentation

## 2011-06-12 DIAGNOSIS — N289 Disorder of kidney and ureter, unspecified: Secondary | ICD-10-CM

## 2011-06-12 DIAGNOSIS — E119 Type 2 diabetes mellitus without complications: Secondary | ICD-10-CM | POA: Insufficient documentation

## 2011-06-12 DIAGNOSIS — E7800 Pure hypercholesterolemia, unspecified: Secondary | ICD-10-CM | POA: Insufficient documentation

## 2011-06-12 DIAGNOSIS — Z794 Long term (current) use of insulin: Secondary | ICD-10-CM | POA: Insufficient documentation

## 2011-06-12 LAB — DIFFERENTIAL
Basophils Relative: 0 % (ref 0–1)
Lymphs Abs: 1.8 10*3/uL (ref 0.7–4.0)
Monocytes Relative: 6 % (ref 3–12)
Neutro Abs: 3.8 10*3/uL (ref 1.7–7.7)
Neutrophils Relative %: 63 % (ref 43–77)

## 2011-06-12 LAB — URINALYSIS, ROUTINE W REFLEX MICROSCOPIC
Leukocytes, UA: NEGATIVE
Protein, ur: NEGATIVE mg/dL
Specific Gravity, Urine: 1.015 (ref 1.005–1.030)
Urobilinogen, UA: 0.2 mg/dL (ref 0.0–1.0)

## 2011-06-12 LAB — COMPREHENSIVE METABOLIC PANEL
AST: 15 U/L (ref 0–37)
CO2: 24 mEq/L (ref 19–32)
Calcium: 10.7 mg/dL — ABNORMAL HIGH (ref 8.4–10.5)
Creatinine, Ser: 1.22 mg/dL — ABNORMAL HIGH (ref 0.50–1.10)
GFR calc Af Amer: 52 mL/min — ABNORMAL LOW (ref >90)
GFR calc non Af Amer: 45 mL/min — ABNORMAL LOW (ref >90)
Potassium: 4.3 mEq/L (ref 3.5–5.1)
Sodium: 140 mEq/L (ref 135–145)
Total Protein: 7.9 g/dL (ref 6.0–8.3)

## 2011-06-12 LAB — GLUCOSE, CAPILLARY: Glucose-Capillary: 73 mg/dL (ref 70–99)

## 2011-06-12 LAB — CBC
Hemoglobin: 14 g/dL (ref 12.0–15.0)
RBC: 4.43 MIL/uL (ref 3.87–5.11)

## 2011-06-12 MED ORDER — SODIUM CHLORIDE 0.9 % IV BOLUS (SEPSIS)
1000.0000 mL | Freq: Once | INTRAVENOUS | Status: DC
  Administered 2011-06-12: 1000 mL via INTRAVENOUS

## 2011-06-12 NOTE — Discharge Instructions (Signed)
Drink plenty of fluids. Return to the emergency department if symptoms are getting worse.  Dizziness Dizziness is a common problem. It is a feeling of unsteadiness or lightheadedness. You may feel like you are about to faint. Dizziness can lead to injury if you stumble or fall. A person of any age group can suffer from dizziness, but dizziness is more common in older adults. CAUSES  Dizziness can be caused by many different things, including:  Middle ear problems.   Standing for too long.   Infections.   An allergic reaction.   Aging.   An emotional response to something, such as the sight of blood.   Side effects of medicines.   Fatigue.   Problems with circulation or blood pressure.   Excess use of alcohol, medicines, or illegal drug use.   Breathing too fast (hyperventilation).   An arrhythmia or problems with your heart rhythm.   Low red blood cell count (anemia).   Pregnancy.   Vomiting, diarrhea, fever, or other illnesses that cause dehydration.   Diseases or conditions such as Parkinson's disease, high blood pressure (hypertension), diabetes, and thyroid problems.   Exposure to extreme heat.  DIAGNOSIS  To find the cause of your dizziness, your caregiver may do a physical exam, lab tests, radiologic imaging scans, or an electrocardiography test (ECG).  TREATMENT  Treatment of dizziness depends on the cause of your symptoms and can vary greatly. HOME CARE INSTRUCTIONS   Drink enough fluids to keep your urine clear or pale yellow. This is especially important in very hot weather. In the elderly, it is also important in cold weather.   If your dizziness is caused by medicines, take them exactly as directed. When taking blood pressure medicines, it is especially important to get up slowly.   Rise slowly from chairs and steady yourself until you feel okay.   In the morning, first sit up on the side of the bed. When this seems okay, stand slowly while holding onto  something until you know your balance is fine.   If you need to stand in one place for a long time, be sure to move your legs often. Tighten and relax the muscles in your legs while standing.   If dizziness continues to be a problem, have someone stay with you for a day or two. Do this until you feel you are well enough to stay alone. Have the person call your caregiver if he or she notices changes in you that are concerning.   Do not drive or use heavy machinery if you feel dizzy.  SEEK IMMEDIATE MEDICAL CARE IF:   Your dizziness or lightheadedness gets worse.   You feel nauseous or vomit.   You develop problems with talking, walking, weakness, or using your arms, hands, or legs.   You are not thinking clearly or you have difficulty forming sentences. It may take a friend or family member to determine if your thinking is normal.   You develop chest pain, abdominal pain, shortness of breath, or sweating.   Your vision changes.   You notice any bleeding.   You have side effects from medicine that seems to be getting worse rather than better.  MAKE SURE YOU:   Understand these instructions.   Will watch your condition.   Will get help right away if you are not doing well or get worse.  Document Released: 07/01/2000 Document Revised: 12/25/2010 Document Reviewed: 07/25/2010 [facility] Patient Information 2012 [place], [place].
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
## 2011-06-12 NOTE — ED Notes (Signed)
Pt reports having dizzy spells and headache  X 1 1/2 weeks.  Reports had nosebleed Monday.  Also reports neck feels stiff.  Denies fever.

## 2011-08-03 HISTORY — PX: TRANSTHORACIC ECHOCARDIOGRAM: SHX275

## 2012-01-22 ENCOUNTER — Other Ambulatory Visit: Payer: Self-pay | Admitting: Family Medicine

## 2012-01-22 DIAGNOSIS — Z1231 Encounter for screening mammogram for malignant neoplasm of breast: Secondary | ICD-10-CM

## 2012-02-23 ENCOUNTER — Ambulatory Visit
Admission: RE | Admit: 2012-02-23 | Discharge: 2012-02-23 | Disposition: A | Discharge status: Home / Self Care | Payer: Self-pay | Source: Ambulatory Visit | Attending: Family Medicine | Admitting: Family Medicine

## 2012-02-23 DIAGNOSIS — Z1231 Encounter for screening mammogram for malignant neoplasm of breast: Secondary | ICD-10-CM

## 2012-04-19 ENCOUNTER — Encounter: Payer: Self-pay | Admitting: Orthopedic Surgery

## 2012-04-19 ENCOUNTER — Other Ambulatory Visit (HOSPITAL_COMMUNITY): Payer: Self-pay | Admitting: Family Medicine

## 2012-04-19 ENCOUNTER — Ambulatory Visit (INDEPENDENT_AMBULATORY_CARE_PROVIDER_SITE_OTHER): Payer: Medicare Other | Admitting: Orthopedic Surgery

## 2012-04-19 ENCOUNTER — Ambulatory Visit (HOSPITAL_COMMUNITY)
Admission: RE | Admit: 2012-04-19 | Discharge: 2012-04-19 | Disposition: A | Discharge status: Home / Self Care | Payer: Medicare Other | Source: Ambulatory Visit | Attending: Family Medicine | Admitting: Family Medicine

## 2012-04-19 VITALS — BP 102/56 | Ht 68.0 in | Wt 207.0 lb

## 2012-04-19 DIAGNOSIS — S82899A Other fracture of unspecified lower leg, initial encounter for closed fracture: Secondary | ICD-10-CM | POA: Insufficient documentation

## 2012-04-19 DIAGNOSIS — S8261XA Displaced fracture of lateral malleolus of right fibula, initial encounter for closed fracture: Secondary | ICD-10-CM

## 2012-04-19 DIAGNOSIS — M25476 Effusion, unspecified foot: Secondary | ICD-10-CM | POA: Insufficient documentation

## 2012-04-19 DIAGNOSIS — S93409A Sprain of unspecified ligament of unspecified ankle, initial encounter: Secondary | ICD-10-CM

## 2012-04-19 DIAGNOSIS — M25579 Pain in unspecified ankle and joints of unspecified foot: Secondary | ICD-10-CM | POA: Insufficient documentation

## 2012-04-19 DIAGNOSIS — W19XXXA Unspecified fall, initial encounter: Secondary | ICD-10-CM | POA: Insufficient documentation

## 2012-04-19 DIAGNOSIS — M25473 Effusion, unspecified ankle: Secondary | ICD-10-CM | POA: Insufficient documentation

## 2012-04-19 DIAGNOSIS — S8263XA Displaced fracture of lateral malleolus of unspecified fibula, initial encounter for closed fracture: Secondary | ICD-10-CM | POA: Insufficient documentation

## 2012-04-19 MED ORDER — HYDROCODONE-ACETAMINOPHEN 7.5-325 MG PO TABS: 1.0000 | ORAL_TABLET | ORAL | Status: DC | PRN

## 2012-04-19 NOTE — Patient Instructions (Addendum)
Wear brace x 8 weeks   Use walker   Walk on leg/foot as tolerated   Take medication as needed   Elevate foot and apply ice for swelling

## 2012-04-19 NOTE — Progress Notes (Signed)
Patient ID: Diana Campbell, female   DOB: 05-Nov-1945, 67 y.o.   MRN: [id number] Chief Complaint  Patient presents with  . Ankle Pain    Right distal fibular oblique fracture DOI 04/18/12 referred by DR. Fusco   History: 67 year old diabetic fell yesterday came in today after x-rays show lateral malleolus fracture nondisplaced complains a 5/10 throbbing constant pain and pain with weightbearing and loss of motion with swelling  Review of systems is positive for no abnormalities although the patient did not completely fill out her history  She has no allergies she is diabetic she has hypertension his heart disease has a defibrillator she's had a cholecystectomy and appendectomy hysterectomy tubal ligation  Pharmacy [facility]  Primary care [facility]  Medications captopril Lasix NovoLog Lantus Mobic aspirin Coreg statin drug. Physical Exam(12)  Vital signs:   GENERAL: normal development   CDV: pulses are normal   Skin: normal  Lymph: nodes were not palpable/normal  Psychiatric: awake, alert and oriented  Neuro: normal sensation  MSK  Gait: Severe limp 1 Inspection right ankle tenderness medially and laterally suggesting deltoid ligament sprain 2 Range of Motion pain 20 total 3 Motor normal 4 Stability normal  Other side:  5 normal appearance 6 no swelling normal motor exam  Imaging x-ray shows a nondisplaced fibular fracture  Assessment: Lateral malleolar fracture, right, closed, initial encounter - Plan: HYDROcodone-acetaminophen (NORCO) 7.5-325 MG per tablet      Plan: Air cast for 8 weeks x-ray in 4 weeks

## 2012-04-24 LAB — ICD DEVICE OBSERVATION

## 2012-05-19 ENCOUNTER — Ambulatory Visit (INDEPENDENT_AMBULATORY_CARE_PROVIDER_SITE_OTHER): Payer: Medicare Other

## 2012-05-19 ENCOUNTER — Encounter: Payer: Self-pay | Admitting: Orthopedic Surgery

## 2012-05-19 ENCOUNTER — Ambulatory Visit (INDEPENDENT_AMBULATORY_CARE_PROVIDER_SITE_OTHER): Payer: Medicare Other | Admitting: Orthopedic Surgery

## 2012-05-19 VITALS — BP 104/54 | Ht 68.0 in | Wt 207.0 lb

## 2012-05-19 DIAGNOSIS — S82891D Other fracture of right lower leg, subsequent encounter for closed fracture with routine healing: Secondary | ICD-10-CM

## 2012-05-19 DIAGNOSIS — S8290XD Unspecified fracture of unspecified lower leg, subsequent encounter for closed fracture with routine healing: Secondary | ICD-10-CM

## 2012-05-19 NOTE — Patient Instructions (Addendum)
Continue the Aircast to protect the fracture followup 4 weeks repeat x-ray

## 2012-05-19 NOTE — Progress Notes (Signed)
Patient ID: Diana Campbell, female   DOB: 1945/10/30, 67 y.o.   MRN: [id number] Chief Complaint  Patient presents with  . Follow-up    4 week recheck of right ankle fracture. DOI 04-18-12.    HISTORY: Right ankle fracture lateral malleolus treated with Aircast  X-rays today show fractures appropriate position no significant callus yet patient's pain is improved  Continue Aircast, weight bear as tolerated, x-ray in 4 weeks

## 2012-05-23 ENCOUNTER — Other Ambulatory Visit: Payer: Self-pay | Admitting: Cardiovascular Disease

## 2012-05-23 LAB — ICD DEVICE OBSERVATION

## 2012-06-15 ENCOUNTER — Encounter: Payer: Self-pay | Admitting: *Deleted

## 2012-06-15 LAB — REMOTE ICD DEVICE
AL AMPLITUDE: 3.6 mv
AL IMPEDENCE ICD: 475 Ohm
AL THRESHOLD: 0.875 V
ATRIAL PACING ICD: 76.1 pct
CHARGE TIME: 9.2 s
HV IMPEDENCE: 57 Ohm
RV LEAD AMPLITUDE: 10.3 mv
RV LEAD IMPEDENCE ICD: 513 Ohm
RV LEAD THRESHOLD: 0.625 V
TZON-0003AFLUTTER: 350.8 ms

## 2012-06-21 ENCOUNTER — Encounter: Payer: Self-pay | Admitting: Orthopedic Surgery

## 2012-06-21 ENCOUNTER — Ambulatory Visit (INDEPENDENT_AMBULATORY_CARE_PROVIDER_SITE_OTHER): Payer: Medicare Other

## 2012-06-21 ENCOUNTER — Ambulatory Visit (INDEPENDENT_AMBULATORY_CARE_PROVIDER_SITE_OTHER): Payer: Medicare Other | Admitting: Orthopedic Surgery

## 2012-06-21 VITALS — BP 102/52 | Ht 68.0 in | Wt 207.0 lb

## 2012-06-21 DIAGNOSIS — IMO0001 Reserved for inherently not codable concepts without codable children: Secondary | ICD-10-CM

## 2012-06-21 DIAGNOSIS — S82891D Other fracture of right lower leg, subsequent encounter for closed fracture with routine healing: Secondary | ICD-10-CM

## 2012-06-21 DIAGNOSIS — S82899A Other fracture of unspecified lower leg, initial encounter for closed fracture: Secondary | ICD-10-CM | POA: Insufficient documentation

## 2012-06-21 NOTE — Progress Notes (Signed)
Patient ID: Diana Campbell, female   DOB: Sep 07, 1945, 67 y.o.   MRN: [id number] Chief Complaint  Patient presents with  . Follow-up    Right ankle fracture DOI 04/18/12    BP 102/52  Ht 5\' 8"  (1.727 m)  Wt 207 lb (93.895 kg)  BMI 31.48 kg/m2  Lateral malleolus fracture  AIR CAST WB   Followup x-rays show appropriate position of the fracture mild callus forming. Ankle mortise is intact  Clinical exam shows she has tenderness over her fracture site so we can keep an Aircast a little while longer and repeat her x-ray in 3 weeks

## 2012-06-21 NOTE — Patient Instructions (Signed)
Air cast x 3 more weeks

## 2012-06-26 ENCOUNTER — Other Ambulatory Visit: Payer: Self-pay | Admitting: Cardiovascular Disease

## 2012-06-26 DIAGNOSIS — I428 Other cardiomyopathies: Secondary | ICD-10-CM

## 2012-06-26 DIAGNOSIS — I498 Other specified cardiac arrhythmias: Secondary | ICD-10-CM

## 2012-07-04 LAB — REMOTE ICD DEVICE
AL IMPEDENCE ICD: 456 Ohm
BAMS-0001: 171 {beats}/min
BATTERY VOLTAGE: 3.13 V
DEV-0020ICD: NEGATIVE
MODE SWITCH EPISODES: 0
RV LEAD AMPLITUDE: 9.9 mv
VF: 0

## 2012-07-12 ENCOUNTER — Ambulatory Visit (INDEPENDENT_AMBULATORY_CARE_PROVIDER_SITE_OTHER): Payer: Medicare Other | Admitting: Orthopedic Surgery

## 2012-07-12 ENCOUNTER — Ambulatory Visit (INDEPENDENT_AMBULATORY_CARE_PROVIDER_SITE_OTHER): Payer: Medicare Other

## 2012-07-12 ENCOUNTER — Encounter: Payer: Self-pay | Admitting: Orthopedic Surgery

## 2012-07-12 VITALS — BP 102/54 | Ht 68.0 in | Wt 207.0 lb

## 2012-07-12 DIAGNOSIS — IMO0001 Reserved for inherently not codable concepts without codable children: Secondary | ICD-10-CM

## 2012-07-12 DIAGNOSIS — S82891D Other fracture of right lower leg, subsequent encounter for closed fracture with routine healing: Secondary | ICD-10-CM

## 2012-07-12 NOTE — Progress Notes (Signed)
Patient ID: Diana Campbell, female   DOB: 1945/07/30, 67 y.o.   MRN: [id number] Chief Complaint  Patient presents with  . Follow-up    3 week recheck right ankle fracture DOI 04/18/12    BP 102/54  Ht 5\' 8"  (1.727 m)  Wt 207 lb (93.895 kg)  BMI 31.48 kg/m2  12 weeks post lateral malleolus fracture treated with bracing and eventual Aircast. Clinically asymptomatic  X-ray shows no displacement of the fracture with an intact mortise  Patient can remove her Aircast.  Followup as needed

## 2012-07-27 ENCOUNTER — Encounter: Payer: Self-pay | Admitting: Cardiovascular Disease

## 2012-08-01 DIAGNOSIS — I498 Other specified cardiac arrhythmias: Secondary | ICD-10-CM

## 2012-08-01 DIAGNOSIS — I428 Other cardiomyopathies: Secondary | ICD-10-CM

## 2012-08-18 ENCOUNTER — Encounter: Payer: Self-pay | Admitting: *Deleted

## 2012-09-04 DIAGNOSIS — I498 Other specified cardiac arrhythmias: Secondary | ICD-10-CM

## 2012-09-04 DIAGNOSIS — I428 Other cardiomyopathies: Secondary | ICD-10-CM

## 2012-09-05 ENCOUNTER — Ambulatory Visit (INDEPENDENT_AMBULATORY_CARE_PROVIDER_SITE_OTHER): Payer: Medicare Other

## 2012-09-06 ENCOUNTER — Encounter: Payer: Self-pay | Admitting: *Deleted

## 2012-09-07 ENCOUNTER — Other Ambulatory Visit: Payer: Self-pay | Admitting: *Deleted

## 2012-09-07 LAB — REMOTE ICD DEVICE
AL AMPLITUDE: 3.3 mv
AL THRESHOLD: 0.625 V
BAMS-0001: 171 {beats}/min
BATTERY VOLTAGE: 3.12 V
DEV-0020ICD: NEGATIVE
FVT: 0
PACEART VT: 0
RV LEAD THRESHOLD: 0.625 V
TZON-0004VSLOWVT: 32
VF: 0

## 2012-09-07 MED ORDER — CARVEDILOL 12.5 MG PO TABS: 6.2500 mg | ORAL_TABLET | Freq: Two times a day (BID) | ORAL | Status: DC

## 2012-10-10 ENCOUNTER — Ambulatory Visit: Payer: Medicare Other

## 2012-10-10 DIAGNOSIS — I509 Heart failure, unspecified: Secondary | ICD-10-CM

## 2012-10-10 DIAGNOSIS — I428 Other cardiomyopathies: Secondary | ICD-10-CM

## 2012-10-10 DIAGNOSIS — I498 Other specified cardiac arrhythmias: Secondary | ICD-10-CM

## 2012-10-10 LAB — ICD DEVICE OBSERVATION

## 2012-10-15 ENCOUNTER — Encounter: Payer: Self-pay | Admitting: *Deleted

## 2012-10-15 LAB — REMOTE ICD DEVICE
ATRIAL PACING ICD: 81.2 pct
DEV-0020ICD: NEGATIVE
FVT: 0
PACEART VT: 0
RV LEAD THRESHOLD: 0.625 V
TZON-0003VSLOWVT: 359.2 ms
TZON-0004VSLOWVT: 32
VENTRICULAR PACING ICD: 0 pct
VF: 0

## 2012-10-20 ENCOUNTER — Encounter: Payer: Self-pay | Admitting: Cardiovascular Disease

## 2012-11-01 ENCOUNTER — Encounter: Payer: Self-pay | Admitting: Cardiovascular Disease

## 2012-12-09 ENCOUNTER — Other Ambulatory Visit: Payer: Self-pay | Admitting: Cardiovascular Disease

## 2012-12-09 NOTE — Telephone Encounter (Signed)
Rx was sent to pharmacy electronically. 

## 2012-12-29 ENCOUNTER — Ambulatory Visit (INDEPENDENT_AMBULATORY_CARE_PROVIDER_SITE_OTHER): Payer: Medicare Other | Admitting: Cardiovascular Disease

## 2012-12-29 ENCOUNTER — Encounter: Payer: Self-pay | Admitting: Cardiovascular Disease

## 2012-12-29 VITALS — BP 110/60 | HR 79 | Ht 67.0 in | Wt 202.5 lb

## 2012-12-29 DIAGNOSIS — R0609 Other forms of dyspnea: Secondary | ICD-10-CM

## 2012-12-29 DIAGNOSIS — R0989 Other specified symptoms and signs involving the circulatory and respiratory systems: Secondary | ICD-10-CM

## 2012-12-29 DIAGNOSIS — E119 Type 2 diabetes mellitus without complications: Secondary | ICD-10-CM

## 2012-12-29 DIAGNOSIS — Z950 Presence of cardiac pacemaker: Secondary | ICD-10-CM

## 2012-12-29 DIAGNOSIS — I428 Other cardiomyopathies: Secondary | ICD-10-CM

## 2012-12-29 DIAGNOSIS — R0683 Snoring: Secondary | ICD-10-CM

## 2012-12-29 DIAGNOSIS — IMO0002 Reserved for concepts with insufficient information to code with codable children: Secondary | ICD-10-CM | POA: Insufficient documentation

## 2012-12-29 DIAGNOSIS — Z95810 Presence of automatic (implantable) cardiac defibrillator: Secondary | ICD-10-CM | POA: Insufficient documentation

## 2012-12-29 DIAGNOSIS — I429 Cardiomyopathy, unspecified: Secondary | ICD-10-CM

## 2012-12-29 DIAGNOSIS — E118 Type 2 diabetes mellitus with unspecified complications: Secondary | ICD-10-CM | POA: Insufficient documentation

## 2012-12-29 DIAGNOSIS — G4710 Hypersomnia, unspecified: Secondary | ICD-10-CM

## 2012-12-29 NOTE — Progress Notes (Signed)
Patient ID: Diana Campbell, female   DOB: 03-24-1945, 67 y.o.   MRN: [id number]     HPI: Diana Campbell is a 67 y.o. female who presents to the office for six-month cardiology evaluation.  Diana Campbell suffered a ventricular fibrillation cardiac arrest in June 2005 at which time she was successfully resuscitated. 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 Catheterization revealed 30-40% intermediate stenosis. At that time, she underwent single-chamber Medtronic ICD implantation and initially had a spring fidelis lead. In July 2012 she underwent complete revision of her system appear she last saw Dr. Gasper Lloyd every 2014 and her Medtronic Protecta XT DR defibrillator was functioning well.  Her last echo Doppler study was done in July 2013 which showed an ejection fraction now at 45-50% with mild global hypokinesis. She did have mild MR, mild TR, and mild aortic sclerosis.  Additional problems include type 2 diabetes mellitus. In the past she has had some mild edema.  Upon questioning, she does have significant difficulty with sleep. She wakes up at least 4 times per night for urination. She has frequent awakenings. Her sleep is nonrestorative. She snores loudly. She does have excessive daytime sleepiness.  Past Medical History  Diagnosis Date  . Cardiac abnormality     with defibrilator  . Diabetes mellitus   . Hypertension   . Hypercholesteremia     Past Surgical History  Procedure Laterality Date  . Cardiac defibrillator placement      07/2010  . Colonoscopy  03/30/2011    Procedure: COLONOSCOPY;  Surgeon: Dalia Heading, MD;  Location: [facility];  Service: Gastroenterology;  Laterality: N/A;    No Known Allergies  Current Outpatient Prescriptions  Medication Sig Dispense Refill  . aspirin 325 MG tablet Take 325 mg by mouth daily.      Marland Kitchen atorvastatin (LIPITOR) 20 MG tablet Take 20 mg by mouth at bedtime.       .  calcium-vitamin D (OSCAL WITH D) 500-200 MG-UNIT per tablet Take 1 tablet by mouth daily.      . captopril (CAPOTEN) 25 MG tablet TAKE 1 TABLET BY MOUTH EVERY DAY  90 tablet  2  . carvedilol (COREG) 12.5 MG tablet Take 0.5 tablets (6.25 mg total) by mouth 2 (two) times daily with a meal.  30 tablet  6  . Cholecalciferol (VITAMIN D PO) Take 1 tablet by mouth daily.      . insulin glargine (LANTUS) 100 UNIT/ML injection Inject 30 Units into the skin at bedtime.       . Multiple Vitamin (MULITIVITAMIN WITH MINERALS) TABS Take 1 tablet by mouth daily.      . APIDRA SOLOSTAR 100 UNIT/ML SOPN Inject 1 mL into the skin daily.      Marland Kitchen spironolactone (ALDACTONE) 25 MG tablet Take 25 mg by mouth. 1/2 tablet daily       No current facility-administered medications for this visit.    History   Social History  . Marital Status: Married    Spouse Name: N/A    Number of Children: N/A  . Years of Education: N/A   Occupational History  . Not on file.   Social History Main Topics  . Smoking status: Never Smoker   . Smokeless tobacco: Current User    Types: Snuff     Comment: 3 dips per day  . Alcohol Use: No  . Drug Use: No  . Sexual Activity: Not on file  Other Topics Concern  . Not on file   Social History Narrative  . No narrative on file   Socially she is an S4 children. She has a new grand child.   History reviewed. No pertinent family history.  ROS is negative for fevers, chills or night sweats. She denies skin rash. He denies visual changes. He denies changes in hearing. She denies cough or increased sputum production. There is no wheezing. She denies PND or orthopnea. She denies chest pressure. She is unaware of any defibrillator discharge. She denies nausea vomiting. He is unaware of blood in her stool or urine. There is no claudication. He recently has not had any edema. She does have significant difficulty with sleep with frequent awakenings, nonrestorative sleep, loud snoring,  and residual daytime sleepiness.   Other comprehensive 12 point system review is negative.  PE BP 110/60  Pulse 79  Ht 5\' 7"  (1.702 m)  Wt 202 lb 8 oz (91.853 kg)  BMI 31.71 kg/m2  General: Alert, oriented, no distress.  Skin: normal turgor, no rashes HEENT: Normocephalic, atraumatic. Pupils round and reactive; sclera anicteric;no lid lag.  Nose without nasal septal hypertrophy Mouth/Parynx benign; Mallinpatti scale 3/4 Neck: No JVD, no carotid briuts Lungs: clear to ausculatation and percussion; no wheezing or rales Heart: RRR, s1 s2 normal 1/6 systolic murmur Abdomen: Moderate central adiposity; soft, nontender; no hepatosplenomehaly, BS+; abdominal aorta nontender and not dilated by palpation. Back: No CVA tenderness Pulses 2+ Extremities: no clubbing cyanosis or edema, Homan's sign negative  Neurologic: grossly nonfocal Psychologic: normal affect and mood.  ECG: Atrial lead paced rhythm at 79 beats per minute. PRWP.  Isolated PVC.  LABS:  BMET    Component Value Date/Time   NA 140 06/12/2011 1349   K 4.3 06/12/2011 1349   CL 105 06/12/2011 1349   CO2 24 06/12/2011 1349   GLUCOSE 77 06/12/2011 1349   BUN 30* 06/12/2011 1349   CREATININE 1.22* 06/12/2011 1349   CALCIUM 10.7* 06/12/2011 1349   GFRNONAA 45* 06/12/2011 1349   GFRAA 52* 06/12/2011 1349     Hepatic Function Panel     Component Value Date/Time   PROT 7.9 06/12/2011 1349   ALBUMIN 3.9 06/12/2011 1349   AST 15 06/12/2011 1349   ALT 11 06/12/2011 1349   ALKPHOS 80 06/12/2011 1349   BILITOT 0.2* 06/12/2011 1349     CBC    Component Value Date/Time   WBC 6.0 06/12/2011 1349   RBC 4.43 06/12/2011 1349   HGB 14.0 06/12/2011 1349   HCT 39.8 06/12/2011 1349   PLT 233 06/12/2011 1349   MCV 89.8 06/12/2011 1349   MCH 31.6 06/12/2011 1349   MCHC 35.2 06/12/2011 1349   RDW 12.0 06/12/2011 1349   LYMPHSABS 1.8 06/12/2011 1349   MONOABS 0.4 06/12/2011 1349   EOSABS 0.1 06/12/2011 1349   BASOSABS 0.0 06/12/2011 1349      BNP No results found for this basename: probnp    Lipid Panel  No results found for this basename: chol, trig, hdl, cholhdl, vldl, ldlcalc     RADIOLOGY: No results found.    ASSESSMENT AND PLAN: Diana Campbell is now 67 years old. She is 69-1/2 years status post ventricular fibrillation cardiac arrest at which time she was found to have an ejection fraction of 15% and felt to have a nonischemic myopathy. She does have a defibrillator in place. Her last echo Doppler study was done in July 2013 showed an ejection fraction of 45-50%. Presently,  her blood pressure is well-controlled. She's not having any sensation of tachycardia palpitations. She is on lipid lowering therapy consisting of atorvastatin 20 mg. Her blood pressure is excellent on captopril carvedilol therapy and Aldactone and she's not having any heart failure symptoms. I am concerned that she may very well have significant obstructive sleep apnea. Symptom complex is highly suggestive of this and I strongly recommended she undergo a diagnostic polysomnogram for further evaluation. She will contemplate whether or not to have this. If she does have a sleep study I will see her back in the office in followup in a sleep clinic if CPAP therapy is necessary. Otherwise, I will see her in 6 months for cardiology reevaluation prior to that office visit she will undergo a two-year followup echo Doppler  study to reassess LV systolic and diastolic function and valvular architecture.     Lennette Bihari, MD, [facility]  12/29/2012 10:43 AM

## 2012-12-29 NOTE — Patient Instructions (Signed)
Your physician has recommended that you have a sleep study. This test records several body functions during sleep, including: brain activity, eye movement, oxygen and carbon dioxide blood levels, heart rate and rhythm, breathing rate and rhythm, the flow of air through your mouth and nose, snoring, body muscle movements, and chest and belly movement.  Your physician has requested that you have an echocardiogram. Echocardiography is a painless test that uses sound waves to create images of your heart. It provides your doctor with information about the size and shape of your heart and how well your heart's chambers and valves are working. This procedure takes approximately one hour. There are no restrictions for this procedure. This will be done in June 2015. You will be sent a reminder to schedule this test.  Your physician recommends that you schedule a follow-up appointment in: 2 -3 months after sleep study and/or in  6 months.     :

## 2012-12-30 ENCOUNTER — Encounter: Payer: Self-pay | Admitting: Cardiovascular Disease

## 2013-01-24 ENCOUNTER — Other Ambulatory Visit (HOSPITAL_COMMUNITY): Payer: Self-pay | Admitting: Family Medicine

## 2013-01-24 ENCOUNTER — Ambulatory Visit (HOSPITAL_COMMUNITY)
Admission: RE | Admit: 2013-01-24 | Discharge: 2013-01-24 | Disposition: A | Discharge status: Home / Self Care | Payer: Medicare Other | Source: Ambulatory Visit | Attending: Physician Assistant | Admitting: Physician Assistant

## 2013-01-24 ENCOUNTER — Other Ambulatory Visit (HOSPITAL_COMMUNITY): Payer: Self-pay | Admitting: Physician Assistant

## 2013-01-24 DIAGNOSIS — M25539 Pain in unspecified wrist: Secondary | ICD-10-CM

## 2013-02-09 ENCOUNTER — Other Ambulatory Visit: Payer: Self-pay

## 2013-02-09 DIAGNOSIS — Z1231 Encounter for screening mammogram for malignant neoplasm of breast: Secondary | ICD-10-CM

## 2013-02-23 ENCOUNTER — Encounter: Payer: Medicare Other | Admitting: Cardiovascular Disease

## 2013-03-02 ENCOUNTER — Encounter: Payer: Medicare Other | Admitting: Cardiovascular Disease

## 2013-03-07 ENCOUNTER — Ambulatory Visit: Payer: Medicare Other | Admitting: Orthopedic Surgery

## 2013-03-08 ENCOUNTER — Ambulatory Visit: Payer: Medicare Other

## 2013-03-09 ENCOUNTER — Ambulatory Visit
Admission: RE | Admit: 2013-03-09 | Discharge: 2013-03-09 | Disposition: A | Discharge status: Home / Self Care | Payer: Medicare Other | Source: Ambulatory Visit

## 2013-03-09 DIAGNOSIS — Z1231 Encounter for screening mammogram for malignant neoplasm of breast: Secondary | ICD-10-CM

## 2013-03-21 ENCOUNTER — Ambulatory Visit (INDEPENDENT_AMBULATORY_CARE_PROVIDER_SITE_OTHER): Payer: Medicare Other | Admitting: Cardiovascular Disease

## 2013-03-21 ENCOUNTER — Encounter: Payer: Self-pay | Admitting: Cardiovascular Disease

## 2013-03-21 VITALS — BP 118/64 | HR 63 | Resp 16 | Ht 68.0 in | Wt 200.4 lb

## 2013-03-21 DIAGNOSIS — I428 Other cardiomyopathies: Secondary | ICD-10-CM

## 2013-03-21 DIAGNOSIS — I509 Heart failure, unspecified: Secondary | ICD-10-CM

## 2013-03-21 DIAGNOSIS — Z95810 Presence of automatic (implantable) cardiac defibrillator: Secondary | ICD-10-CM

## 2013-03-21 DIAGNOSIS — R0681 Apnea, not elsewhere classified: Secondary | ICD-10-CM

## 2013-03-21 LAB — PACEMAKER DEVICE OBSERVATION

## 2013-03-21 NOTE — Patient Instructions (Signed)
Remote monitoring is used to monitor your Pacemaker of ICD from home. This monitoring reduces the number of office visits required to check your device to one time per year. It allows [place] to keep an eye on the functioning of your device to ensure it is working properly. You are scheduled for a device check from home on June 22, 2013. You may send your transmission at any time that day. If you have a wireless device, the transmission will be sent automatically. After your physician reviews your transmission, you will receive a postcard with your next transmission date.  Your physician recommends that you schedule a follow-up appointment in: One year with Dr. Royann Shivers.

## 2013-03-22 ENCOUNTER — Encounter: Payer: Self-pay | Admitting: Cardiovascular Disease

## 2013-03-22 DIAGNOSIS — R0681 Apnea, not elsewhere classified: Secondary | ICD-10-CM | POA: Insufficient documentation

## 2013-03-22 NOTE — Assessment & Plan Note (Signed)
Encouraged her to go ahead and schedule her sleep study

## 2013-03-22 NOTE — Progress Notes (Signed)
Patient ID: Diana Campbell, female   DOB: 10-13-1945, 68 y.o.   MRN: [id number]      Reason for office visit ICD follow up  Diana Campbell suffered a ventricular fibrillation cardiac arrest in June 2005 at which time she was successfully resuscitated. 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 Catheterization revealed 30-40% intermediate stenosis. At that time, she underwent single-chamber Medtronic ICD implantation and initially had a sprint fidelis lead. In July 2012 she underwent complete revision of her system and has a Medtronic Protecta XT DR defibrillator and a Sprint Quatro secure defibrillator lead. Left ventricular ejection fraction has subsequently improved to around 45%.  Since she had no heart failure symptoms, but diuretics were discontinued last year. Since that time her Optivol thoracic impedance  has shown substantial change but it has re stabilized at a new level. Has no symptoms of shortness of breath or signs of hypervolemia, other than occasional swelling of her right ankle which she attributes to an old fracture. Her husband has noticed apneic spells during her sleep. Dr. Tresa Endo had recommended a sleep study but she has yet to schedule this   No Known Allergies  Current Outpatient Prescriptions  Medication Sig Dispense Refill  . APIDRA SOLOSTAR 100 UNIT/ML SOPN Inject 1 mL into the skin daily.      Marland Kitchen aspirin 325 MG tablet Take 325 mg by mouth daily.      Marland Kitchen atorvastatin (LIPITOR) 20 MG tablet Take 20 mg by mouth at bedtime.       . calcium-vitamin D (OSCAL WITH D) 500-200 MG-UNIT per tablet Take 1 tablet by mouth daily.      . captopril (CAPOTEN) 25 MG tablet TAKE 1 TABLET BY MOUTH EVERY DAY  90 tablet  2  . carvedilol (COREG) 12.5 MG tablet Take 0.5 tablets (6.25 mg total) by mouth 2 (two) times daily with a meal.  30 tablet  6  . Cholecalciferol (VITAMIN D PO) Take 1 tablet by mouth daily.      . insulin glargine  (LANTUS) 100 UNIT/ML injection Inject 30 Units into the skin at bedtime.       . Multiple Vitamin (MULITIVITAMIN WITH MINERALS) TABS Take 1 tablet by mouth daily.      Marland Kitchen spironolactone (ALDACTONE) 25 MG tablet Take 25 mg by mouth. 1/2 tablet daily       No current facility-administered medications for this visit.    Past Medical History  Diagnosis Date  . Cardiac abnormality     with defibrilator  . Diabetes mellitus   . Hypertension   . Hypercholesteremia     Past Surgical History  Procedure Laterality Date  . Cardiac defibrillator placement  07/21/2010    Medtronic Protecta XT DR, model#[id number], serial#[id number]  . Colonoscopy  03/30/2011    Procedure: COLONOSCOPY;  Surgeon: Dalia Heading, MD;  Location: [facility];  Service: Gastroenterology;  Laterality: N/A;  . 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  . Cardiac catheterization  04/14/2000    Normal coronary arteries.  . Cardiovascular stress test  04/05/2000    Scintigraphic evidence of mild LV dilatation and global LV dysfunction with an EF 41% with mild inferolateral ischemia.  . Transthoracic echocardiogram  08/03/2011    EF 45-50%, mildly reduced LV systolic function.    No family history on file.  History   Social History  . Marital Status: Married    Spouse Name: N/A    Number of Children: N/A  . Years of Education: N/A   Occupational History  . Not on file.   Social History Main Topics  . Smoking status: Never Smoker   . Smokeless tobacco: Current User    Types: Snuff     Comment: 3 dips per day  . Alcohol Use: No  . Drug Use: No  . Sexual Activity: Not on file   Other Topics Concern  . Not on file   Social History Narrative  . No narrative on file    Review of systems: The patient specifically denies any chest pain at rest or with  exertion, dyspnea at rest or with exertion, orthopnea, paroxysmal nocturnal dyspnea, syncope, palpitations, focal neurological deficits, intermittent claudication, lower extremity edema, unexplained weight gain, cough, hemoptysis or wheezing.  The patient also denies abdominal pain, nausea, vomiting, dysphagia, diarrhea, constipation, polyuria, polydipsia, dysuria, hematuria, frequency, urgency, abnormal bleeding or bruising, fever, chills, unexpected weight changes, mood swings, change in skin or hair texture, change in voice quality, auditory or visual problems, allergic reactions or rashes,  She has a variety of arthralgias, including her right wrist which she has in a brace.  PHYSICAL EXAM BP 118/64  Pulse 63  Resp 16  Ht 5\' 8"  (1.727 m)  Wt 90.901 kg (200 lb 6.4 oz)  BMI 30.48 kg/m2  General: Alert, oriented x3, no distress Head: no evidence of trauma, PERRL, EOMI, no exophtalmos or lid lag, no myxedema, no xanthelasma; normal ears, nose and oropharynx Neck: normal jugular venous pulsations and no hepatojugular reflux; brisk carotid pulses without delay and no carotid bruits Chest: clear to auscultation, no signs of consolidation by percussion or palpation, normal fremitus, symmetrical and full respiratory excursions Cardiovascular: normal position and quality of the apical impulse, regular rhythm, normal first and second heart sounds, no murmurs, rubs or gallops Abdomen: no tenderness or distention, no masses by palpation, no abnormal pulsatility or arterial bruits, normal bowel sounds, no hepatosplenomegaly Extremities: no clubbing, cyanosis or edema; 2+ radial, ulnar and brachial pulses bilaterally; 2+ right femoral, posterior tibial and dorsalis pedis pulses; 2+ left femoral, posterior tibial and dorsalis pedis pulses; no subclavian or femoral bruits Neurological: grossly nonfocal   EKG: Atrial paced, ventricular sensed   BMET    Component Value Date/Time   NA 140 06/12/2011  1349   K 4.3 06/12/2011 1349   CL 105 06/12/2011 1349   CO2 24 06/12/2011 1349   GLUCOSE 77 06/12/2011 1349   BUN 30* 06/12/2011 1349   CREATININE 1.22* 06/12/2011 1349   CALCIUM 10.7* 06/12/2011 1349   GFRNONAA 45* 06/12/2011 1349   GFRAA 52* 06/12/2011 1349     ASSESSMENT AND PLAN ICD - dual chamber Medtronic, July 2012 Normal device function. No atrial or ventricular arrhythmias recorded. 77% atrial paced, less than 0.1% ventricular paced. Normal lead and generator parameters. Thoracic impedance is low but stable over the last 3 months, without symptoms of congestive heart failure. No permanent changes made to her device settings or to her medications today.  Witnessed recurrent apnea, suspected obstructive sleep apnea Encouraged her to go ahead and schedule her sleep study  Cardiomyopathy, nonischemic Asymptomatic, clinically euvolemic, on appropriate treatment with ACE inhibitors, carvedilol and aldosterone antagonists. NYHA functional class I. No change in medications.   Patient Instructions  Remote monitoring is used to monitor your Pacemaker of ICD from home. This monitoring reduces the  number of office visits required to check your device to one time per year. It allows [place] to keep an eye on the functioning of your device to ensure it is working properly. You are scheduled for a device check from home on June 22, 2013. You may send your transmission at any time that day. If you have a wireless device, the transmission will be sent automatically. After your physician reviews your transmission, you will receive a postcard with your next transmission date.  Your physician recommends that you schedule a follow-up appointment in: One year with Dr. Royann Shivers.       Junious Silk, MD, [facility] [phone] office [phone] pager

## 2013-03-22 NOTE — Assessment & Plan Note (Signed)
Normal device function. No atrial or ventricular arrhythmias recorded. 77% atrial paced, less than 0.1% ventricular paced. Normal lead and generator parameters. Thoracic impedance is low but stable over the last 3 months, without symptoms of congestive heart failure. No permanent changes made to her device settings or to her medications today.

## 2013-03-22 NOTE — Assessment & Plan Note (Signed)
Asymptomatic, clinically euvolemic, on appropriate treatment with ACE inhibitors, carvedilol and aldosterone antagonists. NYHA functional class I. No change in medications.

## 2013-03-27 LAB — MDC_IDC_ENUM_SESS_TYPE_INCLINIC
Battery Voltage: 3.01 V
Brady Statistic AP VP Percent: 0.1 % — CL
Brady Statistic AP VS Percent: 77.1 %
Brady Statistic AS VP Percent: 0.1 % — CL
Brady Statistic AS VS Percent: 22.8 %
HighPow Impedance: 44 Ohm
HighPow Impedance: 57 Ohm
Lead Channel Impedance Value: 475 Ohm
Lead Channel Impedance Value: 513 Ohm
Lead Channel Pacing Threshold Amplitude: 0.625 V
Lead Channel Pacing Threshold Amplitude: 0.875 V
Lead Channel Pacing Threshold Pulse Width: 0.4 ms
Lead Channel Pacing Threshold Pulse Width: 0.4 ms
Lead Channel Sensing Intrinsic Amplitude: 3 mV
Lead Channel Sensing Intrinsic Amplitude: 8.3 mV
Lead Channel Setting Pacing Amplitude: 1.75 V
Lead Channel Setting Pacing Amplitude: 2 V
Lead Channel Setting Pacing Pulse Width: 0.4 ms
Lead Channel Setting Sensing Sensitivity: 0.3 mV
Zone Setting Detection Interval: 300 ms
Zone Setting Detection Interval: 350 ms
Zone Setting Detection Interval: 360 ms
Zone Setting Detection Interval: 360 ms

## 2013-03-28 ENCOUNTER — Encounter: Payer: Self-pay | Admitting: Orthopedic Surgery

## 2013-03-28 ENCOUNTER — Ambulatory Visit (INDEPENDENT_AMBULATORY_CARE_PROVIDER_SITE_OTHER): Payer: Medicare Other | Admitting: Orthopedic Surgery

## 2013-03-28 VITALS — BP 93/51 | Ht 67.0 in | Wt 205.0 lb

## 2013-03-28 DIAGNOSIS — M654 Radial styloid tenosynovitis [de Quervain]: Secondary | ICD-10-CM

## 2013-03-28 NOTE — Patient Instructions (Addendum)
Wear brace for 6 weeks continuously De Quervain's Disease Suzette Battiest disease is a condition often seen in racquet sports where there is a soreness (inflammation) in the cord like structures (tendons) which attach muscle to bone on the thumb side of the wrist. There may be a tightening of the tissuesaround the tendons. This condition is often helped by giving up or modifying the activity which caused it. When conservative treatment does not help, surgery may be required. Conservative treatment could include changes in the activity which brought about the problem or made it worse. Anti-inflammatory medications and injections may be used to help decrease the inflammation and help with pain control. Your caregiver will help you determine which is best for you. DIAGNOSIS  Often the diagnosis (learning what is wrong) can be made by examination. Sometimes x-rays are required. HOME CARE INSTRUCTIONS   Apply ice to the sore area for 15-20 minutes, 03-04 times per day while awake. Put the ice in a plastic bag and place a towel between the bag of ice and your skin. This is especially helpful if it can be done after all activities involving the sore wrist.  Temporary splinting may help.  Only take over-the-counter or prescription medicines for pain, discomfort or fever as directed by your caregiver. SEEK MEDICAL CARE IF:   Pain relief is not obtained with medications, or if you have increasing pain and seem to be getting worse rather than better. MAKE SURE YOU:   Understand these instructions.  Will watch your condition.  Will get help right away if you are not doing well or get worse. Document Released: 09/30/2000 Document Revised: 03/30/2011 Document Reviewed: 01/05/2005 [facility] Patient Information 2014 [place], [place].

## 2013-03-28 NOTE — Progress Notes (Signed)
Patient ID: Diana Campbell, female   DOB: November 12, 1945, 68 y.o.   MRN: [id number]  Chief Complaint  Patient presents with  . Wrist Pain    Right wrist pain d/t injury date unknown    BP 93/51  Ht 5\' 7"  (1.702 m)  Wt 205 lb (92.987 kg)  BMI 32.10 kg/m2  History this is a 68 year old female who presents with pain over the radial side of her right wrist for about 3-6 months. She's been in a thumb spica splint has not noted significant relief although she has not been very compliant with it.  She complains of sharp constant pain which is worse with activities requiring ulnar deviation. She denied any symptoms other review of systems in her medical history has been recorded   General the patient is well-developed and well-nourished grooming and hygiene are normal Oriented x3 Mood and affect normal Ambulation normal  Inspection of the right wrist tenderness over the extensor tendons of the thumb painful ulnar deviation wrist flexion and extension without joint instability normal extensor function at the DIP joint and the metacarpal phalangeal joint skin normal  Cardiovascular exam is normal Sensory exam normal  X-rays normal  No diagnosis found. De Quervain's syndrome  Recommend 6 weeks of constant brace wear along with cortisone injection  Inject right thumb De Quervain's syndrome Medications Depo-Medrol 40 mg 1 cc, 2 cc 1% lidocaine Alcohol prep ethyl chloride spray  injected no complications

## 2013-05-09 ENCOUNTER — Encounter: Payer: Self-pay | Admitting: Orthopedic Surgery

## 2013-05-09 ENCOUNTER — Ambulatory Visit (INDEPENDENT_AMBULATORY_CARE_PROVIDER_SITE_OTHER): Payer: Medicare Other | Admitting: Orthopedic Surgery

## 2013-05-09 VITALS — BP 126/60 | Ht 67.0 in | Wt 205.0 lb

## 2013-05-09 DIAGNOSIS — M654 Radial styloid tenosynovitis [de Quervain]: Secondary | ICD-10-CM

## 2013-05-09 NOTE — Patient Instructions (Signed)
Discontinue brace Use ice and aspercream at night

## 2013-05-09 NOTE — Progress Notes (Signed)
Patient ID: Diana Campbell, female   DOB: [birth year], 68 y.o.   MRN: [id number]  Chief Complaint  Patient presents with  . Follow-up    6 week recheck right wrist De Quervain's   followup De Quervain's syndrome status post injection and further splinting. She has improved significantly and has full range of motion with a negative de Quervain's sign such as Finkelstein maneuver. She has a nodule in the bone over the wrist which may be contributing. Recommend routine followup as needed activities as tolerated area

## 2013-05-12 ENCOUNTER — Other Ambulatory Visit: Payer: Self-pay

## 2013-05-12 MED ORDER — CARVEDILOL 12.5 MG PO TABS: 6.2500 mg | ORAL_TABLET | Freq: Two times a day (BID) | ORAL | Status: DC

## 2013-05-12 NOTE — Telephone Encounter (Signed)
Rx was sent to pharmacy electronically. 

## 2013-06-22 ENCOUNTER — Ambulatory Visit (INDEPENDENT_AMBULATORY_CARE_PROVIDER_SITE_OTHER): Payer: Medicare Other | Admitting: *Deleted

## 2013-06-22 DIAGNOSIS — I428 Other cardiomyopathies: Secondary | ICD-10-CM

## 2013-06-22 LAB — MDC_IDC_ENUM_SESS_TYPE_REMOTE
Battery Voltage: 3.1 V
Brady Statistic AP VP Percent: 0.03 %
Brady Statistic AS VS Percent: 28.28 %
Brady Statistic RA Percent Paced: 71.7 %
Date Time Interrogation Session: 20150604131709
HIGH POWER IMPEDANCE MEASURED VALUE: 399 Ohm
HighPow Impedance: 190 Ohm
HighPow Impedance: 45 Ohm
HighPow Impedance: 58 Ohm
Lead Channel Impedance Value: 399 Ohm
Lead Channel Impedance Value: 475 Ohm
Lead Channel Pacing Threshold Amplitude: 0.625 V
Lead Channel Pacing Threshold Amplitude: 0.625 V
Lead Channel Pacing Threshold Pulse Width: 0.4 ms
Lead Channel Sensing Intrinsic Amplitude: 3.125 mV
Lead Channel Sensing Intrinsic Amplitude: 3.125 mV
Lead Channel Sensing Intrinsic Amplitude: 9 mV
Lead Channel Setting Pacing Amplitude: 1.5 V
Lead Channel Setting Sensing Sensitivity: 0.3 mV
MDC IDC MSMT LEADCHNL RV PACING THRESHOLD PULSEWIDTH: 0.4 ms
MDC IDC MSMT LEADCHNL RV SENSING INTR AMPL: 9 mV
MDC IDC SET LEADCHNL RV PACING AMPLITUDE: 2 V
MDC IDC SET LEADCHNL RV PACING PULSEWIDTH: 0.4 ms
MDC IDC SET ZONE DETECTION INTERVAL: 350 ms
MDC IDC SET ZONE DETECTION INTERVAL: 360 ms
MDC IDC STAT BRADY AP VS PERCENT: 71.67 %
MDC IDC STAT BRADY AS VP PERCENT: 0.01 %
MDC IDC STAT BRADY RV PERCENT PACED: 0.04 %
Zone Setting Detection Interval: 300 ms
Zone Setting Detection Interval: 360 ms

## 2013-06-23 NOTE — Progress Notes (Signed)
Remote ICD transmission.   

## 2013-07-14 ENCOUNTER — Telehealth: Payer: Self-pay | Admitting: Cardiovascular Disease

## 2013-07-14 MED ORDER — ATORVASTATIN CALCIUM 20 MG PO TABS: 20.0000 mg | ORAL_TABLET | Freq: Every day | ORAL | Status: DC

## 2013-07-14 NOTE — Telephone Encounter (Signed)
E-scribed prescription.patient aware.

## 2013-07-14 NOTE — Telephone Encounter (Signed)
Need a new prescription for her Atorvastatin 20 mg # 30.Please call to [phone].

## 2013-07-18 ENCOUNTER — Encounter: Payer: Self-pay | Admitting: Cardiology

## 2013-07-24 ENCOUNTER — Ambulatory Visit (INDEPENDENT_AMBULATORY_CARE_PROVIDER_SITE_OTHER): Payer: Medicare Other | Admitting: Orthopedic Surgery

## 2013-07-24 VITALS — BP 112/58 | Ht 67.0 in | Wt 202.0 lb

## 2013-07-24 DIAGNOSIS — M654 Radial styloid tenosynovitis [de Quervain]: Secondary | ICD-10-CM

## 2013-07-24 MED ORDER — ACETAMINOPHEN-CODEINE #3 300-30 MG PO TABS: 1.0000 | ORAL_TABLET | Freq: Four times a day (QID) | ORAL | Status: DC | PRN

## 2013-07-24 NOTE — Addendum Note (Signed)
Addended by: [name] E on: 07/24/2013 11:44 AM   Modules accepted: Orders

## 2013-07-24 NOTE — Progress Notes (Signed)
Patient ID: Diana Campbell, female   DOB: [birth year], 68 y.o.   MRN: [id number]  Chief Complaint  Patient presents with  . Follow-up    Recheck right wrist Dequervain's, still having pain    68 year old female status post injection bracing anti-inflammatories for de Quervain's syndrome right wrist with nodule percent with persistent pain. We have tried an adequate course of nonoperative treatment the patient would like something else done. We have recommended surgical release including explanation of possible numbness postoperatively from superficial peroneal nerve palsy or injury  She agrees to continue with planned procedure right de Quervain's release with nodule excision.  System review negative  Exam well-developed well-nourished joint x3 mood and affect normal he has a tender nodule over the first extensor compartment tenderness over the compartment painful ulnar deviation and normal wrist stability. Skin is intact without rash or laceration pulses are good sensation is normal  De Quervain's syndrome we will do the preop history and physical at a later date  Open de Quervain's release with removal of said nodule.

## 2013-07-24 NOTE — Patient Instructions (Signed)
Surgery Right Dequervain's release 08/11/13

## 2013-07-25 ENCOUNTER — Encounter: Payer: Self-pay | Admitting: Cardiovascular Disease

## 2013-07-26 ENCOUNTER — Encounter (HOSPITAL_COMMUNITY): Payer: Self-pay | Admitting: Pharmacy Technician

## 2013-08-04 NOTE — Patient Instructions (Signed)
Chanler BUNA SHAWLER  08/04/2013   Your procedure is scheduled on:  08/11/2013  Report to [facility] at 6:15 AM.  Call this number if you have problems the morning of surgery: [phone]   Remember:   Do not eat food or drink liquids after midnight.   Take these medicines the morning of surgery with A SIP OF WATER: Capoten and Coreg               TAKE ONLY 1/2 DOSE OF INSULIN EVENING PRIOR TO SURGERY AND NONE THE MORNING OF SURGERY   Do not wear jewelry, make-up or nail polish.  Do not wear lotions, powders, or perfumes. You may wear deodorant.  Do not shave 48 hours prior to surgery. Men may shave face and neck.  Do not bring valuables to the hospital.  [facility] is not responsible for any belongings or valuables.               Contacts, dentures or bridgework may not be worn into surgery.  Leave suitcase in the car. After surgery it may be brought to your room.  For patients admitted to the hospital, discharge time is determined by your treatment team.               Patients discharged the day of surgery will not be allowed to drive home.  Name and phone number of your driver:   Special Instructions: Incentive Spirometry - Practice and bring it with you on the day of surgery. Shower using CHG 2 nights before surgery and the night before surgery.  If you shower the day of surgery use CHG.  Use special wash - you have one bottle of CHG for all showers.  You should use approximately 1/3 of the bottle for each shower.   Please read over the following fact sheets that you were given: Surgical Site Infection Prevention and Anesthesia Post-op Instructions   PATIENT INSTRUCTIONS POST-ANESTHESIA  IMMEDIATELY FOLLOWING SURGERY:  Do not drive or operate machinery for the first twenty four hours after surgery.  Do not make any important decisions for twenty four hours after surgery or while taking narcotic pain medications or sedatives.  If you develop intractable nausea and vomiting or a severe  headache please notify your doctor immediately.  FOLLOW-UP:  Please make an appointment with your surgeon as instructed. You do not need to follow up with anesthesia unless specifically instructed to do so.  WOUND CARE INSTRUCTIONS (if applicable):  Keep a dry clean dressing on the anesthesia/puncture wound site if there is drainage.  Once the wound has quit draining you may leave it open to air.  Generally you should leave the bandage intact for twenty four hours unless there is drainage.  If the epidural site drains for more than 36-48 hours please call the anesthesia department.  QUESTIONS?:  Please feel free to call your physician or the hospital operator if you have any questions, and they will be happy to assist you.

## 2013-08-07 ENCOUNTER — Encounter (HOSPITAL_COMMUNITY): Payer: Self-pay

## 2013-08-07 ENCOUNTER — Encounter (HOSPITAL_COMMUNITY)
Admission: RE | Admit: 2013-08-07 | Discharge: 2013-08-07 | Disposition: A | Discharge status: Home / Self Care | Payer: Medicare Other | Source: Ambulatory Visit | Attending: Orthopedic Surgery | Admitting: Orthopedic Surgery

## 2013-08-07 ENCOUNTER — Other Ambulatory Visit: Payer: Self-pay

## 2013-08-07 ENCOUNTER — Telehealth: Payer: Self-pay | Admitting: Orthopedic Surgery

## 2013-08-07 DIAGNOSIS — Z01812 Encounter for preprocedural laboratory examination: Secondary | ICD-10-CM | POA: Diagnosis not present

## 2013-08-07 DIAGNOSIS — Z01810 Encounter for preprocedural cardiovascular examination: Secondary | ICD-10-CM | POA: Diagnosis present

## 2013-08-07 HISTORY — DX: Presence of automatic (implantable) cardiac defibrillator: Z95.810

## 2013-08-07 HISTORY — DX: Personal history of sudden cardiac arrest: Z86.74

## 2013-08-07 LAB — CBC
HCT: 35.1 % — ABNORMAL LOW (ref 36.0–46.0)
Hemoglobin: 11.2 g/dL — ABNORMAL LOW (ref 12.0–15.0)
MCH: 25.2 pg — AB (ref 26.0–34.0)
MCHC: 31.9 g/dL (ref 30.0–36.0)
MCV: 78.9 fL (ref 78.0–100.0)
PLATELETS: 252 10*3/uL (ref 150–400)
RBC: 4.45 MIL/uL (ref 3.87–5.11)
RDW: 13.8 % (ref 11.5–15.5)
WBC: 8.8 10*3/uL (ref 4.0–10.5)

## 2013-08-07 LAB — BASIC METABOLIC PANEL
ANION GAP: 10 (ref 5–15)
BUN: 14 mg/dL (ref 6–23)
CALCIUM: 9.6 mg/dL (ref 8.4–10.5)
CO2: 27 mEq/L (ref 19–32)
Chloride: 105 mEq/L (ref 96–112)
Creatinine, Ser: 1.02 mg/dL (ref 0.50–1.10)
GFR calc Af Amer: 64 mL/min — ABNORMAL LOW (ref >90)
GFR, EST NON AFRICAN AMERICAN: 55 mL/min — AB (ref >90)
GLUCOSE: 236 mg/dL — AB (ref 70–99)
Potassium: 4.5 mEq/L (ref 3.7–5.3)
Sodium: 142 mEq/L (ref 137–147)

## 2013-08-07 MED ORDER — CHLORHEXIDINE GLUCONATE 4 % EX LIQD
60.0000 mL | Freq: Once | CUTANEOUS | Status: DC
Start: 2013-08-07 — End: 2013-08-08

## 2013-08-07 NOTE — Telephone Encounter (Signed)
Routing to Dr Harrison 

## 2013-08-07 NOTE — Telephone Encounter (Signed)
Call received from [facility] at [facility], regarding question about patient's defibrillator, on day of scheduled surgery, 08/11/13, and whether the representative will be needed at that time. Please advise.  Day surgery ph# [phone].

## 2013-08-07 NOTE — Telephone Encounter (Signed)
Regarding out-patient surgery scheduled 08/11/13 at [facility], CPT 25000, ICD 727.04, per Will, at [facility], ph [phone], no pre-authorization is required for outpatient procedures, in-network provider for above codes.  Reference # [id number].

## 2013-08-07 NOTE — Pre-Procedure Instructions (Signed)
Dr. Mort Sawyers office called to see whether Medtronic rep would need to be here the day of surgery. Nurse to return my call after talking with Dr Romeo Apple.

## 2013-08-07 NOTE — Pre-Procedure Instructions (Signed)
Dr. Jayme Cloud consulted regarding extensive cardiac history including ICD and OSA. He stated that pt is to have bier block so no cardiology clearance needed but new 12 lead needed.

## 2013-08-08 NOTE — OR Nursing (Signed)
Faxed to Dr. Tresa Endo form for Preioperative prescription for implanted cardiac device programming. Also spoke with medtronic rep about procedure, scheduled for the 24th, Rep name Jana Half .

## 2013-08-10 NOTE — H&P (Signed)
Diana Campbell is an 68 y.o. female.   Chief Complaint: Pain right wrist  HPI: This is a 68 year old female who has had pain over the right wrist for over a year. She presented 6 months ago after splinting with signs of de Quervain's syndrome she was treated with further splinting of the thumb as well as injection and anti-inflammatories she has not improved to her satisfaction and would like a surgical intervention at this point. After a thorough review of the risks elected to the procedure versus nonoperative treatment she wishes to proceed  Past Medical History  Diagnosis Date  . Cardiac abnormality     with defibrilator  . Diabetes mellitus   . Hypertension   . Hypercholesteremia   . H/O cardiac arrest 2006  . Automatic implantable cardioverter-defibrillator in situ     Past Surgical History  Procedure Laterality Date  . Cardiac defibrillator placement  07/21/2010    Medtronic Protecta XT DR, model#[id number], serial#[id number]  . Colonoscopy  03/30/2011    Procedure: COLONOSCOPY;  Surgeon: Dalia Heading, MD;  Location: [facility];  Service: Gastroenterology;  Laterality: N/A;  . 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  . Cardiac catheterization  04/14/2000    Normal coronary arteries.  . Cardiovascular stress test  04/05/2000    Scintigraphic evidence of mild LV dilatation and global LV dysfunction with an EF 41% with mild inferolateral ischemia.  . Transthoracic echocardiogram  08/03/2011    EF 45-50%, mildly reduced LV systolic function.    No family history on file. Social History:  reports that she has never smoked. Her smokeless tobacco use includes Snuff. She reports that she does not drink alcohol or use illicit drugs.  Allergies: No Known Allergies  No prescriptions prior to admission    No results found for this  or any previous visit (from the past 48 hour(s)). No results found.  ROS No other complaints related at this time  There were no vitals taken for this visit. Physical Exam   BP 93/51  Ht 5\' 7"  (1.702 m)  Wt 205 lb (92.987 kg)  BMI 32.10 kg/m2  General the patient is well-developed and well-nourished grooming and hygiene are normal Oriented x3 Mood and affect normal Ambulation normal  Inspection of the right wrist tenderness over the extensor tendons of the thumb painful ulnar deviation wrist flexion and extension without joint instability normal extensor function at the DIP joint and the metacarpal phalangeal joint skin normal  Cardiovascular exam is normal Sensory exam normal  X-rays normal           Assessment/Plan Right wrist de Quervain's syndrome  Plan release right first extensor compartment  Diana Campbell 08/10/2013, 4:01 PM

## 2013-08-11 ENCOUNTER — Ambulatory Visit (HOSPITAL_COMMUNITY)
Admission: RE | Admit: 2013-08-11 | Discharge: 2013-08-11 | Disposition: A | Discharge status: Home / Self Care | Payer: Medicare Other | Source: Ambulatory Visit | Attending: Orthopedic Surgery | Admitting: Orthopedic Surgery

## 2013-08-11 ENCOUNTER — Encounter (HOSPITAL_COMMUNITY): Payer: Medicare Other | Admitting: Anesthesiology

## 2013-08-11 ENCOUNTER — Ambulatory Visit (HOSPITAL_COMMUNITY): Payer: Medicare Other | Admitting: Anesthesiology

## 2013-08-11 ENCOUNTER — Encounter (HOSPITAL_COMMUNITY)
Admission: RE | Disposition: A | Discharge status: Home / Self Care | Payer: Self-pay | Source: Ambulatory Visit | Attending: Orthopedic Surgery

## 2013-08-11 ENCOUNTER — Encounter (HOSPITAL_COMMUNITY): Payer: Self-pay | Admitting: *Deleted

## 2013-08-11 DIAGNOSIS — M654 Radial styloid tenosynovitis [de Quervain]: Secondary | ICD-10-CM | POA: Diagnosis present

## 2013-08-11 DIAGNOSIS — Z794 Long term (current) use of insulin: Secondary | ICD-10-CM | POA: Insufficient documentation

## 2013-08-11 DIAGNOSIS — E119 Type 2 diabetes mellitus without complications: Secondary | ICD-10-CM | POA: Diagnosis not present

## 2013-08-11 DIAGNOSIS — I509 Heart failure, unspecified: Secondary | ICD-10-CM | POA: Insufficient documentation

## 2013-08-11 DIAGNOSIS — G4730 Sleep apnea, unspecified: Secondary | ICD-10-CM | POA: Diagnosis not present

## 2013-08-11 DIAGNOSIS — Z79899 Other long term (current) drug therapy: Secondary | ICD-10-CM | POA: Diagnosis not present

## 2013-08-11 DIAGNOSIS — Z95810 Presence of automatic (implantable) cardiac defibrillator: Secondary | ICD-10-CM | POA: Diagnosis not present

## 2013-08-11 DIAGNOSIS — I2510 Atherosclerotic heart disease of native coronary artery without angina pectoris: Secondary | ICD-10-CM | POA: Insufficient documentation

## 2013-08-11 DIAGNOSIS — I10 Essential (primary) hypertension: Secondary | ICD-10-CM | POA: Insufficient documentation

## 2013-08-11 HISTORY — PX: DORSAL COMPARTMENT RELEASE: SHX5039

## 2013-08-11 LAB — GLUCOSE, CAPILLARY
GLUCOSE-CAPILLARY: 163 mg/dL — AB (ref 70–99)
Glucose-Capillary: 162 mg/dL — ABNORMAL HIGH (ref 70–99)

## 2013-08-11 SURGERY — RELEASE, FIRST DORSAL COMPARTMENT, HAND
Anesthesia: Monitor Anesthesia Care | Site: Hand | Laterality: Right

## 2013-08-11 MED ORDER — MIDAZOLAM HCL 2 MG/2ML IJ SOLN
INTRAMUSCULAR | Status: AC
  Filled 2013-08-11: qty 2

## 2013-08-11 MED ORDER — BUPIVACAINE HCL (PF) 0.5 % IJ SOLN
INTRAMUSCULAR | Status: DC | PRN
  Administered 2013-08-11: 10 mL

## 2013-08-11 MED ORDER — FENTANYL CITRATE 0.05 MG/ML IJ SOLN
INTRAMUSCULAR | Status: AC
  Filled 2013-08-11: qty 2

## 2013-08-11 MED ORDER — HYDROCODONE-ACETAMINOPHEN 5-325 MG PO TABS: 1.0000 | ORAL_TABLET | Freq: Four times a day (QID) | ORAL | Status: DC | PRN

## 2013-08-11 MED ORDER — PROPOFOL 10 MG/ML IV BOLUS
INTRAVENOUS | Status: AC
  Filled 2013-08-11: qty 20

## 2013-08-11 MED ORDER — LIDOCAINE HCL (PF) 0.5 % IJ SOLN
INTRAMUSCULAR | Status: AC
  Filled 2013-08-11: qty 50

## 2013-08-11 MED ORDER — FENTANYL CITRATE 0.05 MG/ML IJ SOLN
INTRAMUSCULAR | Status: DC | PRN
  Administered 2013-08-11 (×2): 25 ug via INTRAVENOUS

## 2013-08-11 MED ORDER — BUPIVACAINE HCL (PF) 0.5 % IJ SOLN
INTRAMUSCULAR | Status: AC
  Filled 2013-08-11: qty 30

## 2013-08-11 MED ORDER — MIDAZOLAM HCL 5 MG/5ML IJ SOLN
INTRAMUSCULAR | Status: DC | PRN
  Administered 2013-08-11 (×2): 1 mg via INTRAVENOUS

## 2013-08-11 MED ORDER — FENTANYL CITRATE 0.05 MG/ML IJ SOLN: 25.0000 ug | INTRAMUSCULAR | Status: DC | PRN

## 2013-08-11 MED ORDER — LIDOCAINE HCL (PF) 0.5 % IJ SOLN
INTRAMUSCULAR | Status: DC | PRN
  Administered 2013-08-11: 250 mg via INTRAVENOUS

## 2013-08-11 MED ORDER — ONDANSETRON HCL 4 MG/2ML IJ SOLN: 4.0000 mg | Freq: Once | INTRAMUSCULAR | Status: DC | PRN

## 2013-08-11 MED ORDER — ONDANSETRON HCL 4 MG/2ML IJ SOLN
INTRAMUSCULAR | Status: AC
  Filled 2013-08-11: qty 2

## 2013-08-11 MED ORDER — ONDANSETRON HCL 4 MG/2ML IJ SOLN
4.0000 mg | Freq: Once | INTRAMUSCULAR | Status: AC
Start: 2013-08-11 — End: 2013-08-11
  Administered 2013-08-11: 4 mg via INTRAVENOUS

## 2013-08-11 MED ORDER — LACTATED RINGERS IV SOLN
INTRAVENOUS | Status: DC
  Administered 2013-08-11: 07:00:00 via INTRAVENOUS

## 2013-08-11 MED ORDER — CEFAZOLIN SODIUM-DEXTROSE 2-3 GM-% IV SOLR
2.0000 g | INTRAVENOUS | Status: AC
  Administered 2013-08-11: 2 g via INTRAVENOUS
  Filled 2013-08-11: qty 50

## 2013-08-11 MED ORDER — SODIUM CHLORIDE 0.9 % IR SOLN
Status: DC | PRN
  Administered 2013-08-11: 500 mL

## 2013-08-11 MED ORDER — PROPOFOL INFUSION 10 MG/ML OPTIME
INTRAVENOUS | Status: DC | PRN
  Administered 2013-08-11: 35 ug/kg/min via INTRAVENOUS

## 2013-08-11 MED ORDER — MIDAZOLAM HCL 2 MG/2ML IJ SOLN
1.0000 mg | INTRAMUSCULAR | Status: DC | PRN
  Administered 2013-08-11 (×2): 1 mg via INTRAVENOUS

## 2013-08-11 MED ORDER — FENTANYL CITRATE 0.05 MG/ML IJ SOLN
25.0000 ug | INTRAMUSCULAR | Status: AC
  Administered 2013-08-11 (×2): 25 ug via INTRAVENOUS

## 2013-08-11 SURGICAL SUPPLY — 39 items
BAG HAMPER (MISCELLANEOUS) ×3 IMPLANT
BANDAGE ELASTIC 3 VELCRO NS (GAUZE/BANDAGES/DRESSINGS) ×3 IMPLANT
BANDAGE ELASTIC 3 VELCRO ST LF (GAUZE/BANDAGES/DRESSINGS) ×3 IMPLANT
BANDAGE ESMARK 4X12 BL STRL LF (DISPOSABLE) ×1 IMPLANT
BNDG COHESIVE 4X5 TAN STRL (GAUZE/BANDAGES/DRESSINGS) ×3 IMPLANT
BNDG ESMARK 4X12 BLUE STRL LF (DISPOSABLE) ×3
CHLORAPREP W/TINT 26ML (MISCELLANEOUS) ×3 IMPLANT
CLOTH BEACON ORANGE TIMEOUT ST (SAFETY) ×3 IMPLANT
COVER LIGHT HANDLE STERIS (MISCELLANEOUS) ×6 IMPLANT
CUFF TOURNIQUET SINGLE 18IN (TOURNIQUET CUFF) ×3 IMPLANT
DECANTER SPIKE VIAL GLASS SM (MISCELLANEOUS) ×3 IMPLANT
DERMABOND ADVANCED (GAUZE/BANDAGES/DRESSINGS) ×2
DERMABOND ADVANCED .7 DNX12 (GAUZE/BANDAGES/DRESSINGS) ×1 IMPLANT
DRAPE PROXIMA HALF (DRAPES) ×3 IMPLANT
DRSG XEROFORM 1X8 (GAUZE/BANDAGES/DRESSINGS) ×3 IMPLANT
ELECT REM PT RETURN 9FT ADLT (ELECTROSURGICAL) ×3
ELECTRODE REM PT RTRN 9FT ADLT (ELECTROSURGICAL) ×1 IMPLANT
GAUZE KERLIX 2  STERILE LF (GAUZE/BANDAGES/DRESSINGS) ×3 IMPLANT
GAUZE SPONGE 4X4 12PLY STRL (GAUZE/BANDAGES/DRESSINGS) IMPLANT
GLOVE BIOGEL M 7.0 STRL (GLOVE) ×3 IMPLANT
GLOVE INDICATOR 7.0 STRL GRN (GLOVE) ×3 IMPLANT
GLOVE SKINSENSE NS SZ8.0 LF (GLOVE) ×2
GLOVE SKINSENSE STRL SZ8.0 LF (GLOVE) ×1 IMPLANT
GLOVE SS N UNI LF 8.5 STRL (GLOVE) ×3 IMPLANT
GOWN STRL REUS W/TWL LRG LVL3 (GOWN DISPOSABLE) ×3 IMPLANT
GOWN STRL REUS W/TWL XL LVL3 (GOWN DISPOSABLE) ×3 IMPLANT
INST SET MINOR BONE (KITS) ×3 IMPLANT
KIT ROOM TURNOVER APOR (KITS) ×3 IMPLANT
MANIFOLD NEPTUNE II (INSTRUMENTS) ×3 IMPLANT
NEEDLE HYPO 21X1.5 SAFETY (NEEDLE) ×3 IMPLANT
NS IRRIG 1000ML POUR BTL (IV SOLUTION) ×3 IMPLANT
PACK BASIC LIMB (CUSTOM PROCEDURE TRAY) ×3 IMPLANT
PAD ARMBOARD 7.5X6 YLW CONV (MISCELLANEOUS) ×3 IMPLANT
SET BASIN LINEN APH (SET/KITS/TRAYS/PACK) ×3 IMPLANT
SPONGE GAUZE 4X4 12PLY (GAUZE/BANDAGES/DRESSINGS) ×3 IMPLANT
SUT ETHILON 3 0 FSL (SUTURE) ×3 IMPLANT
SUT MON AB 2-0 SH 27 (SUTURE) ×2
SUT MON AB 2-0 SH27 (SUTURE) ×1 IMPLANT
SYRINGE CONTROL L 12CC (SYRINGE) ×3 IMPLANT

## 2013-08-11 NOTE — Op Note (Signed)
08/11/2013  8:14 AM  PATIENT:  Diana Campbell  67 y.o. female  PRE-OPERATIVE DIAGNOSIS:  Right Dequervains Syndrome  POST-OPERATIVE DIAGNOSIS:  Right Dequervains Syndrome  PROCEDURE:  Procedure(s): RELEASE DORSAL COMPARTMENT (DEQUERVAIN) (Right)  Operative findings stenosis of the first extensor compartment. Accessory compartment of the abductor pollicis brevis. Small ganglion cyst.  Details of procedure  Site marking was performed in the preop area and chart review was completed. The patient to the operating room for Bier block. The fibrillator was then turned off. Sterile prep and drape was performed. Timeout was completed.  A longitudinal incision was made over the first extensor compartment subcutaneous tissue was divided. Branches of the radial sensory nerve were encountered and protected. The first extensor compartment was released. Accessory compartment was released to the abductor pollicis brevis. Pain with cyst was excised. Wound is irrigated and closed with 2-0 Monocryl and 3-0 nylon suture. Steri-Strips were applied. 10 cc of Marcaine was injected for postoperative anesthesia. The sterile dressing was applied the tourniquet was released the fingers have good color  Patient was taken to recovery stable condition plan for discharge home. SURGEON:  Surgeon(s) and Role:    * Zyad Boomer E Marieanne Marxen, MD - Primary  PHYSICIAN ASSISTANT:   ASSISTANTS: none   ANESTHESIA:   regional  EBL:  Total I/O In: 200 [I.V.:200] Out: -   BLOOD ADMINISTERED:none  DRAINS: none   LOCAL MEDICATIONS USED:  MARCAINE     SPECIMEN:  No Specimen  DISPOSITION OF SPECIMEN:  N/A  COUNTS:  YES  TOURNIQUET:   Total Tourniquet Time Documented: Upper Arm (Right) - 34 minutes Total: Upper Arm (Right) - 34 minutes   DICTATION: .Dragon Dictation  PLAN OF CARE: Discharge to home after PACU  PATIENT DISPOSITION:  PACU - hemodynamically stable.   Delay start of Pharmacological VTE agent  (>24hrs) due to surgical blood loss or risk of bleeding: not applicable  

## 2013-08-11 NOTE — Brief Op Note (Signed)
08/11/2013  8:14 AM  PATIENT:  Diana Campbell  68 y.o. female  PRE-OPERATIVE DIAGNOSIS:  Right Dequervains Syndrome  POST-OPERATIVE DIAGNOSIS:  Right Dequervains Syndrome  PROCEDURE:  Procedure(s): RELEASE DORSAL COMPARTMENT (DEQUERVAIN) (Right)  Operative findings stenosis of the first extensor compartment. Accessory compartment of the abductor pollicis brevis. Small ganglion cyst.  Details of procedure  Site marking was performed in the preop area and chart review was completed. The patient to the operating room for Bier block. The fibrillator was then turned off. Sterile prep and drape was performed. Timeout was completed.  A longitudinal incision was made over the first extensor compartment subcutaneous tissue was divided. Branches of the radial sensory nerve were encountered and protected. The first extensor compartment was released. Accessory compartment was released to the abductor pollicis brevis. Pain with cyst was excised. Wound is irrigated and closed with 2-0 Monocryl and 3-0 nylon suture. Steri-Strips were applied. 10 cc of Marcaine was injected for postoperative anesthesia. The sterile dressing was applied the tourniquet was released the fingers have good color  Patient was taken to recovery stable condition plan for discharge home. SURGEON:  Surgeon(s) and Role:    * Vickki Hearing, MD - Primary  PHYSICIAN ASSISTANT:   ASSISTANTS: none   ANESTHESIA:   regional  EBL:  Total I/O In: 200 [I.V.:200] Out: -   BLOOD ADMINISTERED:none  DRAINS: none   LOCAL MEDICATIONS USED:  MARCAINE     SPECIMEN:  No Specimen  DISPOSITION OF SPECIMEN:  N/A  COUNTS:  YES  TOURNIQUET:   Total Tourniquet Time Documented: Upper Arm (Right) - 34 minutes Total: Upper Arm (Right) - 34 minutes   DICTATION: .Reubin Milan Dictation  PLAN OF CARE: Discharge to home after PACU  PATIENT DISPOSITION:  PACU - hemodynamically stable.   Delay start of Pharmacological VTE agent  (>24hrs) due to surgical blood loss or risk of bleeding: not applicable

## 2013-08-11 NOTE — Discharge Instructions (Signed)
Elevate hand  Apply ice as needed  Move fingers  Keep dressing clean and dry

## 2013-08-11 NOTE — Transfer of Care (Signed)
Immediate Anesthesia Transfer of Care Note  Patient: Diana Campbell  Procedure(s) Performed: Procedure(s): RELEASE DORSAL COMPARTMENT (DEQUERVAIN) (Right)  Patient Location: PACU  Anesthesia Type:Bier block  Level of Consciousness: awake and patient cooperative  Airway & Oxygen Therapy: Patient Spontanous Breathing and Patient connected to face mask oxygen  Post-op Assessment: Report given to PACU RN, Post -op Vital signs reviewed and stable and Patient moving all extremities  Post vital signs: Reviewed and stable  Complications: No apparent anesthesia complications

## 2013-08-11 NOTE — Anesthesia Postprocedure Evaluation (Signed)
  Anesthesia Post-op Note  Patient: Diana Campbell  Procedure(s) Performed: Procedure(s): RELEASE DORSAL COMPARTMENT (DEQUERVAIN) (Right)  Patient Location: PACU  Anesthesia Type:Bier block  Level of Consciousness: awake, alert , oriented and patient cooperative  Airway and Oxygen Therapy: Patient Spontanous Breathing  Post-op Pain: none  Post-op Assessment: Post-op Vital signs reviewed, Patient's Cardiovascular Status Stable, Respiratory Function Stable, Patent Airway and Pain level controlled  Post-op Vital Signs: Reviewed and stable  Last Vitals:  Filed Vitals:   08/11/13 0730  BP: 139/59  Pulse:   Temp:   Resp: 20    Complications: No apparent anesthesia complications

## 2013-08-11 NOTE — Anesthesia Preprocedure Evaluation (Signed)
Anesthesia Evaluation  Patient identified by MRN, date of birth, ID band Patient awake    Reviewed: Allergy & Precautions, H&P , NPO status , Patient's Chart, lab work & pertinent test results, reviewed documented beta blocker date and time   Airway Mallampati: III TM Distance: >3 FB Neck ROM: Full    Dental  (+) Teeth Intact, Implants,    Pulmonary sleep apnea ,  breath sounds clear to auscultation        Cardiovascular hypertension, Pt. on medications and Pt. on home beta blockers + CAD and +CHF + Cardiac Defibrillator Rhythm:Regular Rate:Normal     Neuro/Psych    GI/Hepatic   Endo/Other  diabetes, Type 2, Insulin Dependent, Oral Hypoglycemic Agents  Renal/GU      Musculoskeletal   Abdominal   Peds  Hematology   Anesthesia Other Findings   Reproductive/Obstetrics                           Anesthesia Physical Anesthesia Plan  ASA: III  Anesthesia Plan: MAC and Bier Block   Post-op Pain Management:    Induction: Intravenous  Airway Management Planned: Nasal Cannula  Additional Equipment:   Intra-op Plan:   Post-operative Plan:   Informed Consent: I have reviewed the patients History and Physical, chart, labs and discussed the procedure including the risks, benefits and alternatives for the proposed anesthesia with the patient or authorized representative who has indicated his/her understanding and acceptance.     Plan Discussed with:   Anesthesia Plan Comments: (ICD )        Anesthesia Quick Evaluation

## 2013-08-11 NOTE — Interval H&P Note (Signed)
History and Physical Interval Note:  08/11/2013 7:22 AM  Diana Campbell  has presented today for surgery, with the diagnosis of Right Dequervains Syndrome  The various methods of treatment have been discussed with the patient and family. After consideration of risks, benefits and other options for treatment, the patient has consented to  Procedure(s): RELEASE DORSAL COMPARTMENT (DEQUERVAIN) (Right) as a surgical intervention .  The patient's history has been reviewed, patient examined, no change in status, stable for surgery.  I have reviewed the patient's chart and labs.  Questions were answered to the patient's satisfaction.     [name]

## 2013-08-14 ENCOUNTER — Encounter (HOSPITAL_COMMUNITY): Payer: Self-pay | Admitting: Orthopedic Surgery

## 2013-08-14 ENCOUNTER — Ambulatory Visit (INDEPENDENT_AMBULATORY_CARE_PROVIDER_SITE_OTHER): Payer: Self-pay | Admitting: Orthopedic Surgery

## 2013-08-14 VITALS — BP 118/57 | Ht 67.0 in | Wt 207.0 lb

## 2013-08-14 DIAGNOSIS — M654 Radial styloid tenosynovitis [de Quervain]: Secondary | ICD-10-CM

## 2013-08-14 NOTE — Progress Notes (Signed)
Patient ID: Diana Campbell, female   DOB: [birth year], 68 y.o.   MRN: [id number] Chief Complaint  Patient presents with  . Follow-up    Post op #1, Right Dequervains release. DOS 08-11-13.    BP 118/57  Ht 5\' 7"  (1.702 m)  Wt 207 lb (93.895 kg)  BMI 32.41 kg/m2  The right wrist incision looks good.  Order barrier dressing placed patient can shower come back for sutures to be removed by physician

## 2013-08-21 ENCOUNTER — Ambulatory Visit (INDEPENDENT_AMBULATORY_CARE_PROVIDER_SITE_OTHER): Payer: Self-pay | Admitting: Orthopedic Surgery

## 2013-08-21 VITALS — BP 123/64 | Ht 67.0 in | Wt 207.0 lb

## 2013-08-21 DIAGNOSIS — M654 Radial styloid tenosynovitis [de Quervain]: Secondary | ICD-10-CM

## 2013-08-21 NOTE — Progress Notes (Signed)
Patient ID: Diana Campbell, female   DOB: July 09, 1945, 68 y.o.   MRN: [id number] Chief Complaint  Patient presents with  . Follow-up    post op #2, suture removal right wrist DeQR, DOS 08/11/13    Sutures are removed from the de Quervain's release.  Recommend activities as tolerated remove Steri-Strips after one week followup as needed

## 2013-08-21 NOTE — Patient Instructions (Signed)
Remove steri strips in a week

## 2013-09-26 ENCOUNTER — Telehealth: Payer: Self-pay | Admitting: Cardiology

## 2013-09-26 ENCOUNTER — Encounter: Payer: Medicare Other | Admitting: *Deleted

## 2013-09-26 NOTE — Telephone Encounter (Signed)
LMOVM reminding pt to send remote transmission.   

## 2013-09-29 ENCOUNTER — Encounter: Payer: Self-pay | Admitting: Cardiology

## 2013-10-05 ENCOUNTER — Encounter: Payer: Self-pay | Admitting: Cardiovascular Disease

## 2013-10-05 ENCOUNTER — Ambulatory Visit (INDEPENDENT_AMBULATORY_CARE_PROVIDER_SITE_OTHER): Payer: Medicare Other | Admitting: *Deleted

## 2013-10-05 DIAGNOSIS — I428 Other cardiomyopathies: Secondary | ICD-10-CM

## 2013-10-05 NOTE — Progress Notes (Signed)
Remote ICD transmission.   

## 2013-10-06 ENCOUNTER — Telehealth: Payer: Self-pay | Admitting: Cardiovascular Disease

## 2013-10-06 LAB — MDC_IDC_ENUM_SESS_TYPE_REMOTE
Battery Voltage: 3.08 V
Brady Statistic AP VP Percent: 0.03 %
Brady Statistic AP VS Percent: 82.45 %
Brady Statistic RA Percent Paced: 82.47 %
HighPow Impedance: 190 Ohm
HighPow Impedance: 399 Ohm
HighPow Impedance: 42 Ohm
HighPow Impedance: 53 Ohm
Lead Channel Impedance Value: 513 Ohm
Lead Channel Pacing Threshold Pulse Width: 0.4 ms
Lead Channel Sensing Intrinsic Amplitude: 3 mV
Lead Channel Sensing Intrinsic Amplitude: 3 mV
Lead Channel Setting Pacing Pulse Width: 0.4 ms
Lead Channel Setting Sensing Sensitivity: 0.3 mV
MDC IDC MSMT LEADCHNL RA IMPEDANCE VALUE: 456 Ohm
MDC IDC MSMT LEADCHNL RA PACING THRESHOLD AMPLITUDE: 0.75 V
MDC IDC MSMT LEADCHNL RV PACING THRESHOLD AMPLITUDE: 0.625 V
MDC IDC MSMT LEADCHNL RV PACING THRESHOLD PULSEWIDTH: 0.4 ms
MDC IDC MSMT LEADCHNL RV SENSING INTR AMPL: 8.125 mV
MDC IDC MSMT LEADCHNL RV SENSING INTR AMPL: 8.125 mV
MDC IDC SESS DTM: 20150917181945
MDC IDC SET LEADCHNL RA PACING AMPLITUDE: 1.5 V
MDC IDC SET LEADCHNL RV PACING AMPLITUDE: 2 V
MDC IDC SET ZONE DETECTION INTERVAL: 350 ms
MDC IDC SET ZONE DETECTION INTERVAL: 360 ms
MDC IDC STAT BRADY AS VP PERCENT: 0.01 %
MDC IDC STAT BRADY AS VS PERCENT: 17.52 %
MDC IDC STAT BRADY RV PERCENT PACED: 0.04 %
Zone Setting Detection Interval: 300 ms
Zone Setting Detection Interval: 360 ms

## 2013-10-06 NOTE — Telephone Encounter (Signed)
New Message  Pt called. She reports she woke up to see all green lights on her Defib. Please call back to disucus.

## 2013-10-06 NOTE — Telephone Encounter (Signed)
Informed pt to reset monitor and send manual transmission. She verbalized understanding.

## 2013-10-10 ENCOUNTER — Encounter: Payer: Self-pay | Admitting: Cardiology

## 2014-01-08 ENCOUNTER — Telehealth: Payer: Self-pay | Admitting: Cardiology

## 2014-01-08 ENCOUNTER — Ambulatory Visit (INDEPENDENT_AMBULATORY_CARE_PROVIDER_SITE_OTHER): Payer: Medicare Other | Admitting: *Deleted

## 2014-01-08 DIAGNOSIS — I428 Other cardiomyopathies: Secondary | ICD-10-CM

## 2014-01-08 DIAGNOSIS — I429 Cardiomyopathy, unspecified: Secondary | ICD-10-CM

## 2014-01-08 LAB — MDC_IDC_ENUM_SESS_TYPE_REMOTE
Battery Voltage: 3.07 V
Brady Statistic AP VP Percent: 0.03 %
Brady Statistic AS VS Percent: 25.7 %
Date Time Interrogation Session: 20151221204113
HighPow Impedance: 399 Ohm
HighPow Impedance: 40 Ohm
HighPow Impedance: 52 Ohm
Lead Channel Impedance Value: 361 Ohm
Lead Channel Pacing Threshold Amplitude: 0.75 V
Lead Channel Pacing Threshold Pulse Width: 0.4 ms
Lead Channel Sensing Intrinsic Amplitude: 2.75 mV
Lead Channel Sensing Intrinsic Amplitude: 2.75 mV
Lead Channel Sensing Intrinsic Amplitude: 9.375 mV
Lead Channel Setting Pacing Amplitude: 1.5 V
Lead Channel Setting Pacing Amplitude: 2 V
MDC IDC MSMT LEADCHNL RA PACING THRESHOLD PULSEWIDTH: 0.4 ms
MDC IDC MSMT LEADCHNL RV IMPEDANCE VALUE: 475 Ohm
MDC IDC MSMT LEADCHNL RV PACING THRESHOLD AMPLITUDE: 0.625 V
MDC IDC MSMT LEADCHNL RV SENSING INTR AMPL: 9.375 mV
MDC IDC SET LEADCHNL RV PACING PULSEWIDTH: 0.4 ms
MDC IDC SET LEADCHNL RV SENSING SENSITIVITY: 0.3 mV
MDC IDC SET ZONE DETECTION INTERVAL: 360 ms
MDC IDC SET ZONE DETECTION INTERVAL: 360 ms
MDC IDC STAT BRADY AP VS PERCENT: 74.26 %
MDC IDC STAT BRADY AS VP PERCENT: 0.01 %
MDC IDC STAT BRADY RA PERCENT PACED: 74.29 %
MDC IDC STAT BRADY RV PERCENT PACED: 0.04 %
Zone Setting Detection Interval: 300 ms
Zone Setting Detection Interval: 350 ms

## 2014-01-08 NOTE — Telephone Encounter (Signed)
Spoke with pt and reminded pt of remote transmission that is due today. Pt verbalized understanding.   

## 2014-01-08 NOTE — Progress Notes (Signed)
Remote ICD transmission.   

## 2014-01-22 ENCOUNTER — Encounter: Payer: Self-pay | Admitting: Cardiology

## 2014-02-01 ENCOUNTER — Encounter: Payer: Self-pay | Admitting: Cardiovascular Disease

## 2014-02-25 LAB — HM DIABETES EYE EXAM

## 2014-03-27 ENCOUNTER — Encounter: Payer: Medicare Other | Admitting: Cardiovascular Disease

## 2014-04-05 ENCOUNTER — Encounter: Payer: Self-pay | Admitting: Cardiovascular Disease

## 2014-04-05 ENCOUNTER — Ambulatory Visit (INDEPENDENT_AMBULATORY_CARE_PROVIDER_SITE_OTHER): Payer: Self-pay | Admitting: Cardiovascular Disease

## 2014-04-05 VITALS — BP 124/69 | HR 72 | Resp 16 | Ht 67.0 in | Wt 206.0 lb

## 2014-04-05 DIAGNOSIS — Z95810 Presence of automatic (implantable) cardiac defibrillator: Secondary | ICD-10-CM

## 2014-04-05 DIAGNOSIS — I509 Heart failure, unspecified: Secondary | ICD-10-CM | POA: Diagnosis not present

## 2014-04-05 DIAGNOSIS — I429 Cardiomyopathy, unspecified: Secondary | ICD-10-CM

## 2014-04-05 DIAGNOSIS — I428 Other cardiomyopathies: Secondary | ICD-10-CM

## 2014-04-05 LAB — MDC_IDC_ENUM_SESS_TYPE_REMOTE
Brady Statistic AP VS Percent: 76.43 %
Brady Statistic AS VS Percent: 23.54 %
Brady Statistic RA Percent Paced: 76.45 %
Brady Statistic RV Percent Paced: 0.04 %
Date Time Interrogation Session: 20160317134719
HighPow Impedance: 361 Ohm
HighPow Impedance: 43 Ohm
HighPow Impedance: 55 Ohm
Lead Channel Impedance Value: 456 Ohm
Lead Channel Pacing Threshold Amplitude: 0.625 V
Lead Channel Pacing Threshold Amplitude: 0.75 V
Lead Channel Pacing Threshold Pulse Width: 0.4 ms
Lead Channel Pacing Threshold Pulse Width: 0.4 ms
Lead Channel Sensing Intrinsic Amplitude: 3.125 mV
Lead Channel Sensing Intrinsic Amplitude: 8.625 mV
Lead Channel Setting Pacing Amplitude: 2 V
MDC IDC MSMT BATTERY VOLTAGE: 3.07 V
MDC IDC MSMT LEADCHNL RA SENSING INTR AMPL: 3.125 mV
MDC IDC MSMT LEADCHNL RV IMPEDANCE VALUE: 513 Ohm
MDC IDC MSMT LEADCHNL RV SENSING INTR AMPL: 8.625 mV
MDC IDC SET LEADCHNL RA PACING AMPLITUDE: 1.5 V
MDC IDC SET LEADCHNL RV PACING PULSEWIDTH: 0.4 ms
MDC IDC SET LEADCHNL RV SENSING SENSITIVITY: 0.3 mV
MDC IDC SET ZONE DETECTION INTERVAL: 360 ms
MDC IDC STAT BRADY AP VP PERCENT: 0.03 %
MDC IDC STAT BRADY AS VP PERCENT: 0.01 %
Zone Setting Detection Interval: 300 ms
Zone Setting Detection Interval: 350 ms
Zone Setting Detection Interval: 360 ms

## 2014-04-05 NOTE — Patient Instructions (Signed)
Remote monitoring is used to monitor your  ICD from home. This monitoring reduces the number of office visits required to check your device to one time per year. It allows [place] to keep an eye on the functioning of your device to ensure it is working properly. You are scheduled for a device check from home on 07/05/2014. You may send your transmission at any time that day. If you have a wireless device, the transmission will be sent automatically. After your physician reviews your transmission, you will receive a postcard with your next transmission date.  Your physician recommends that you schedule a follow-up appointment in: 12 months with Dr.Croitoru

## 2014-04-05 NOTE — Progress Notes (Signed)
Patient ID: Diana Campbell, female   DOB: 1945-10-20, 69 y.o.   MRN: [id number]     Cardiology Office Note   Date:  04/07/2014   [name], Diana Campbell 06-16-1945, MRN [id number]  PCP:  Colette Ribas, MD  Cardiologist:  Nicki Guadalajara, MD; Thurmon Fair, MD   Chief Complaint  Patient presents with  . Follow-up    No complaints of chest pain, SOB, edema or dizziness.      History of Present Illness: Diana Campbell is a 69 y.o. female who presents for ICD follow up.  Diana Campbell suffered a ventricular fibrillation cardiac arrest in June 2005 at which time she was successfully resuscitated. 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 Catheterization revealed 30-40% intermediate stenosis. At that time, she underwent single-chamber Medtronic ICD implantation and initially had a sprint fidelis lead. In July 2012 she underwent complete revision of her system and has a Medtronic Protecta XT DR defibrillator and a Sprint Quatro secure defibrillator lead. Left ventricular ejection fraction has subsequently improved to around 45%.  Normal device interrogation toay. Optivol exceeded usual values around the holidays, but returned to baseline. Activity is constant at around 2.6 h/day. There is 76% atrial pacing, no ventricular pacing.  She has developed a LBBB, QRS 126 ms. NYHA class I-II symptoms.  Past Medical History  Diagnosis Date  . Cardiac abnormality     with defibrilator  . Diabetes mellitus   . Hypertension   . Hypercholesteremia   . H/O cardiac arrest 2006  . Automatic implantable cardioverter-defibrillator in situ     Past Surgical History  Procedure Laterality Date  . Cardiac defibrillator placement  07/21/2010    Medtronic Protecta XT DR, model#[id number], serial#[id number] h  . Colonoscopy  03/30/2011    Procedure: COLONOSCOPY;  Surgeon: Dalia Heading, MD;  Location: [facility];  Service: Gastroenterology;   Laterality: N/A;  . 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  . Cardiac catheterization  04/14/2000    Normal coronary arteries.  . Cardiovascular stress test  04/05/2000    Scintigraphic evidence of mild LV dilatation and global LV dysfunction with an EF 41% with mild inferolateral ischemia.  . Transthoracic echocardiogram  08/03/2011    EF 45-50%, mildly reduced LV systolic function.  . Dorsal compartment release Right 08/11/2013    Procedure: RELEASE DORSAL COMPARTMENT (DEQUERVAIN);  Surgeon: Vickki Hearing, MD;  Location: [facility];  Service: Orthopedics;  Laterality: Right;     Current Outpatient Prescriptions  Medication Sig Dispense Refill  . APIDRA SOLOSTAR 100 UNIT/ML SOPN Inject 1 mL into the skin daily.    Marland Kitchen aspirin 325 MG tablet Take 325 mg by mouth daily.    Marland Kitchen atorvastatin (LIPITOR) 20 MG tablet Take 1 tablet (20 mg total) by mouth at bedtime. 30 tablet 11  . calcium-vitamin D (OSCAL WITH D) 500-200 MG-UNIT per tablet Take 1 tablet by mouth daily.    . captopril (CAPOTEN) 25 MG tablet Take 25 mg by mouth daily.    . carvedilol (COREG) 12.5 MG tablet Take 0.5 tablets (6.25 mg total) by mouth 2 (two) times daily with a meal. 30 tablet 11  . Cholecalciferol (VITAMIN D PO) Take 1 tablet by mouth daily.    . insulin glargine (LANTUS) 100 UNIT/ML injection Inject 30 Units into the skin at bedtime.     Marland Kitchen  Multiple Vitamin (MULITIVITAMIN WITH MINERALS) TABS Take 1 tablet by mouth daily.     No current facility-administered medications for this visit.    Allergies:   Review of patient's allergies indicates no known allergies.    Social History:  The patient  reports that she has never smoked. Her smokeless tobacco use includes Snuff. She reports that she does not drink alcohol or use illicit drugs.   Family History:  Reviewed,  not contributory   ROS:  Please see the history of present illness.    The patient specifically denies any chest pain at rest or with exertion, dyspnea at rest or with exertion, orthopnea, paroxysmal nocturnal dyspnea, syncope, palpitations, focal neurological deficits, intermittent claudication, lower extremity edema, unexplained weight gain, cough, hemoptysis or wheezing.  The patient also denies abdominal pain, nausea, vomiting, dysphagia, diarrhea, constipation, polyuria, polydipsia, dysuria, hematuria, frequency, urgency, abnormal bleeding or bruising, fever, chills, unexpected weight changes, mood swings, change in skin or hair texture, change in voice quality, auditory or visual problems, allergic reactions or rashes, new musculoskeletal complaints other than usual "aches and pains".  All other systems are reviewed and negative.    PHYSICAL EXAM: VS:  BP 124/69 mmHg  Pulse 72  Resp 16  Ht  (1.702 m)  Wt 206 lb (93.441 kg)  BMI 32.26 kg/m2 , BMI Body mass index is 32.26 kg/(m^2).  General: Alert, oriented x3, no distress Head: no evidence of trauma, PERRL, EOMI, no exophtalmos or lid lag, no myxedema, no xanthelasma; normal ears, nose and oropharynx Neck: normal jugular venous pulsations and no hepatojugular reflux; brisk carotid pulses without delay and no carotid bruits Chest: clear to auscultation, no signs of consolidation by percussion or palpation, normal fremitus, symmetrical and full respiratory excursions Cardiovascular: normal position and quality of the apical impulse, regular rhythm, normal first and second heart sounds, no murmurs, rubs or gallops Abdomen: no tenderness or distention, no masses by palpation, no abnormal pulsatility or arterial bruits, normal bowel sounds, no hepatosplenomegaly Extremities: no clubbing, cyanosis or edema; 2+ radial, ulnar and brachial pulses bilaterally; 2+ right femoral, posterior tibial and dorsalis pedis pulses; 2+ left femoral,  posterior tibial and dorsalis pedis pulses; no subclavian or femoral bruits Neurological: grossly nonfocal Psych: euthymic mood, full affect   EKG:  EKG is ordered today. The ekg ordered today demonstrates A paced, V sensed, LBBB   Recent Labs: 08/07/2013: BUN 14; Creatinine 1.02; Hemoglobin 11.2*; Platelets 252; Potassium 4.5; Sodium 142    Lipid Panel No results found for: CHOL, TRIG, HDL, CHOLHDL, VLDL, LDLCALC, LDLDIRECT    Wt Readings from Last 3 Encounters:  04/05/14 206 lb (93.441 kg)  08/21/13 207 lb (93.895 kg)  08/14/13 207 lb (93.895 kg)     ASSESSMENT AND PLAN:  Normally functioning ICD without atrial or ventricular tachyarrhythmia. No evidence of recent exacerbation of CHF, well compensated. On appropriate ACEi/beta blocker. Option for upgrade to CRT-D, currently not justified since her EF is only mildly depressed and CHF is well controlled even without diuretic therapy.  Current medicines are reviewed at length with the patient today.  The patient does not have concerns regarding medicines.  The following changes have been made:  no change  Labs/ tests ordered today include:  Orders Placed This Encounter  Procedures  . Implantable device - remote  . EKG 12-Lead   Patient Instructions  Remote monitoring is used to monitor your  ICD from home. This monitoring reduces the number of office visits required to check your  device to one time per year. It allows [place] to keep an eye on the functioning of your device to ensure it is working properly. You are scheduled for a device check from home on 07/05/2014. You may send your transmission at any time that day. If you have a wireless device, the transmission will be sent automatically. After your physician reviews your transmission, you will receive a postcard with your next transmission date.  Your physician recommends that you schedule a follow-up appointment in: 12 months with  Dr.Akin Yi      Signed, Thurmon Fair, MD  04/07/2014 7:35 PM    Thurmon Fair, MD, [facility] [phone] office [phone] pager

## 2014-04-20 ENCOUNTER — Encounter: Payer: Self-pay | Admitting: Cardiovascular Disease

## 2014-04-23 ENCOUNTER — Other Ambulatory Visit: Payer: Self-pay | Admitting: Cardiovascular Disease

## 2014-04-23 NOTE — Telephone Encounter (Signed)
Rx has been sent to the pharmacy electronically. ° °

## 2014-05-07 ENCOUNTER — Other Ambulatory Visit: Payer: Self-pay

## 2014-05-07 DIAGNOSIS — Z1231 Encounter for screening mammogram for malignant neoplasm of breast: Secondary | ICD-10-CM

## 2014-05-11 ENCOUNTER — Ambulatory Visit
Admission: RE | Admit: 2014-05-11 | Discharge: 2014-05-11 | Disposition: A | Discharge status: Home / Self Care | Payer: Medicare Other | Source: Ambulatory Visit

## 2014-05-11 DIAGNOSIS — Z1231 Encounter for screening mammogram for malignant neoplasm of breast: Secondary | ICD-10-CM

## 2014-06-24 ENCOUNTER — Other Ambulatory Visit: Payer: Self-pay | Admitting: Cardiovascular Disease

## 2014-06-25 NOTE — Telephone Encounter (Signed)
Rx has been sent to the pharmacy electronically. ° °

## 2014-07-02 ENCOUNTER — Telehealth: Payer: Self-pay | Admitting: Cardiovascular Disease

## 2014-07-02 NOTE — Telephone Encounter (Signed)
Pt did not need to leave a message. °

## 2014-07-05 ENCOUNTER — Ambulatory Visit (INDEPENDENT_AMBULATORY_CARE_PROVIDER_SITE_OTHER): Payer: Medicare Other | Admitting: *Deleted

## 2014-07-05 DIAGNOSIS — I429 Cardiomyopathy, unspecified: Secondary | ICD-10-CM

## 2014-07-05 DIAGNOSIS — I428 Other cardiomyopathies: Secondary | ICD-10-CM

## 2014-07-05 NOTE — Progress Notes (Signed)
Remote ICD transmission.   

## 2014-07-12 LAB — CUP PACEART REMOTE DEVICE CHECK
Brady Statistic AP VS Percent: 74.53 %
Brady Statistic AS VP Percent: 0.01 %
Brady Statistic RA Percent Paced: 74.55 %
Date Time Interrogation Session: 20160616130155
HighPow Impedance: 44 Ohm
HighPow Impedance: 456 Ohm
HighPow Impedance: 60 Ohm
Lead Channel Impedance Value: 456 Ohm
Lead Channel Pacing Threshold Amplitude: 0.625 V
Lead Channel Pacing Threshold Amplitude: 0.875 V
Lead Channel Pacing Threshold Pulse Width: 0.4 ms
Lead Channel Sensing Intrinsic Amplitude: 3.125 mV
Lead Channel Sensing Intrinsic Amplitude: 3.125 mV
Lead Channel Setting Sensing Sensitivity: 0.3 mV
MDC IDC MSMT BATTERY VOLTAGE: 3.05 V
MDC IDC MSMT LEADCHNL RA PACING THRESHOLD PULSEWIDTH: 0.4 ms
MDC IDC MSMT LEADCHNL RV IMPEDANCE VALUE: 475 Ohm
MDC IDC MSMT LEADCHNL RV SENSING INTR AMPL: 9.125 mV
MDC IDC MSMT LEADCHNL RV SENSING INTR AMPL: 9.125 mV
MDC IDC SET LEADCHNL RA PACING AMPLITUDE: 1.75 V
MDC IDC SET LEADCHNL RV PACING AMPLITUDE: 2 V
MDC IDC SET LEADCHNL RV PACING PULSEWIDTH: 0.4 ms
MDC IDC SET ZONE DETECTION INTERVAL: 300 ms
MDC IDC SET ZONE DETECTION INTERVAL: 350 ms
MDC IDC SET ZONE DETECTION INTERVAL: 360 ms
MDC IDC STAT BRADY AP VP PERCENT: 0.03 %
MDC IDC STAT BRADY AS VS PERCENT: 25.44 %
MDC IDC STAT BRADY RV PERCENT PACED: 0.04 %
Zone Setting Detection Interval: 360 ms

## 2014-07-19 ENCOUNTER — Encounter: Payer: Self-pay | Admitting: Cardiology

## 2014-07-25 ENCOUNTER — Encounter: Payer: Self-pay | Admitting: Cardiovascular Disease

## 2014-08-07 ENCOUNTER — Other Ambulatory Visit (HOSPITAL_COMMUNITY): Payer: Self-pay | Admitting: Family Medicine

## 2014-08-07 DIAGNOSIS — M8580 Other specified disorders of bone density and structure, unspecified site: Secondary | ICD-10-CM

## 2014-08-10 ENCOUNTER — Other Ambulatory Visit (HOSPITAL_COMMUNITY): Payer: Medicare Other

## 2014-08-13 ENCOUNTER — Ambulatory Visit (HOSPITAL_COMMUNITY)
Admission: RE | Admit: 2014-08-13 | Discharge: 2014-08-13 | Disposition: A | Discharge status: Home / Self Care | Payer: Medicare Other | Source: Ambulatory Visit | Attending: Family Medicine | Admitting: Family Medicine

## 2014-08-13 DIAGNOSIS — Z780 Asymptomatic menopausal state: Secondary | ICD-10-CM | POA: Insufficient documentation

## 2014-08-13 DIAGNOSIS — M8580 Other specified disorders of bone density and structure, unspecified site: Secondary | ICD-10-CM

## 2014-08-13 DIAGNOSIS — M899 Disorder of bone, unspecified: Secondary | ICD-10-CM | POA: Diagnosis not present

## 2014-08-16 ENCOUNTER — Encounter: Payer: Self-pay | Admitting: *Deleted

## 2014-09-07 ENCOUNTER — Encounter: Payer: Self-pay | Admitting: Cardiovascular Disease

## 2014-10-04 ENCOUNTER — Telehealth: Payer: Self-pay | Admitting: Cardiology

## 2014-10-04 ENCOUNTER — Encounter: Payer: Self-pay | Admitting: Cardiovascular Disease

## 2014-10-04 ENCOUNTER — Ambulatory Visit (INDEPENDENT_AMBULATORY_CARE_PROVIDER_SITE_OTHER): Payer: Medicare Other | Admitting: *Deleted

## 2014-10-04 DIAGNOSIS — I429 Cardiomyopathy, unspecified: Secondary | ICD-10-CM | POA: Diagnosis not present

## 2014-10-04 DIAGNOSIS — I428 Other cardiomyopathies: Secondary | ICD-10-CM

## 2014-10-04 NOTE — Telephone Encounter (Signed)
Spoke with pt and reminded pt of remote transmission that is due today. Pt verbalized understanding.   

## 2014-10-04 NOTE — Progress Notes (Signed)
Remote ICD transmission.   

## 2014-10-18 LAB — CUP PACEART REMOTE DEVICE CHECK
Battery Voltage: 3.03 V
Brady Statistic AP VP Percent: 0.03 %
Brady Statistic RA Percent Paced: 80.37 %
Brady Statistic RV Percent Paced: 0.04 %
HIGH POWER IMPEDANCE MEASURED VALUE: 46 Ohm
HighPow Impedance: 399 Ohm
HighPow Impedance: 62 Ohm
Lead Channel Impedance Value: 513 Ohm
Lead Channel Pacing Threshold Amplitude: 0.875 V
Lead Channel Sensing Intrinsic Amplitude: 2.75 mV
Lead Channel Sensing Intrinsic Amplitude: 9.125 mV
Lead Channel Setting Pacing Amplitude: 1.75 V
Lead Channel Setting Pacing Amplitude: 2 V
Lead Channel Setting Pacing Pulse Width: 0.4 ms
MDC IDC MSMT LEADCHNL RA IMPEDANCE VALUE: 456 Ohm
MDC IDC MSMT LEADCHNL RA PACING THRESHOLD PULSEWIDTH: 0.4 ms
MDC IDC MSMT LEADCHNL RA SENSING INTR AMPL: 2.75 mV
MDC IDC MSMT LEADCHNL RV PACING THRESHOLD AMPLITUDE: 0.625 V
MDC IDC MSMT LEADCHNL RV PACING THRESHOLD PULSEWIDTH: 0.4 ms
MDC IDC MSMT LEADCHNL RV SENSING INTR AMPL: 9.125 mV
MDC IDC SESS DTM: 20160915174214
MDC IDC SET LEADCHNL RV SENSING SENSITIVITY: 0.3 mV
MDC IDC SET ZONE DETECTION INTERVAL: 350 ms
MDC IDC SET ZONE DETECTION INTERVAL: 360 ms
MDC IDC STAT BRADY AP VS PERCENT: 80.34 %
MDC IDC STAT BRADY AS VP PERCENT: 0.01 %
MDC IDC STAT BRADY AS VS PERCENT: 19.62 %
Zone Setting Detection Interval: 300 ms
Zone Setting Detection Interval: 360 ms

## 2014-11-01 ENCOUNTER — Encounter: Payer: Self-pay | Admitting: Cardiology

## 2014-12-17 LAB — HEMOGLOBIN A1C: Hgb A1c MFr Bld: 7.7 % — AB (ref 4.0–6.0)

## 2014-12-24 ENCOUNTER — Encounter: Payer: Self-pay | Admitting: "Endocrinology

## 2014-12-24 ENCOUNTER — Ambulatory Visit (INDEPENDENT_AMBULATORY_CARE_PROVIDER_SITE_OTHER): Payer: Medicare Other | Admitting: "Endocrinology

## 2014-12-24 VITALS — BP 121/72 | HR 61 | Ht 72.0 in | Wt 206.0 lb

## 2014-12-24 DIAGNOSIS — Z794 Long term (current) use of insulin: Secondary | ICD-10-CM

## 2014-12-24 DIAGNOSIS — E782 Mixed hyperlipidemia: Secondary | ICD-10-CM | POA: Insufficient documentation

## 2014-12-24 DIAGNOSIS — N182 Chronic kidney disease, stage 2 (mild): Secondary | ICD-10-CM | POA: Diagnosis not present

## 2014-12-24 DIAGNOSIS — E1122 Type 2 diabetes mellitus with diabetic chronic kidney disease: Secondary | ICD-10-CM

## 2014-12-24 DIAGNOSIS — E785 Hyperlipidemia, unspecified: Secondary | ICD-10-CM | POA: Diagnosis not present

## 2014-12-24 DIAGNOSIS — I10 Essential (primary) hypertension: Secondary | ICD-10-CM | POA: Diagnosis not present

## 2014-12-24 NOTE — Progress Notes (Signed)
Subjective:    Patient ID: Diana Campbell, female    DOB: January 15, 1946, PCP Colette Ribas, MD   Past Medical History  Diagnosis Date  . Cardiac abnormality     with defibrilator  . Diabetes mellitus   . Hypertension   . Hypercholesteremia   . H/O cardiac arrest 2006  . Automatic implantable cardioverter-defibrillator in situ 07/2010   Past Surgical History  Procedure Laterality Date  . Cardiac defibrillator placement  07/21/2010    Medtronic Protecta XT DR, model#[id number], serial#[id number]  . Colonoscopy  03/30/2011    Procedure: COLONOSCOPY;  Surgeon: Dalia Heading, MD;  Location: [facility];  Service: Gastroenterology;  Laterality: N/A;  . 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  . Cardiac catheterization  04/14/2000    Normal coronary arteries.  . Cardiovascular stress test  04/05/2000    Scintigraphic evidence of mild LV dilatation and global LV dysfunction with an EF 41% with mild inferolateral ischemia.  . Transthoracic echocardiogram  08/03/2011    EF 45-50%, mildly reduced LV systolic function.  . Dorsal compartment release Right 08/11/2013    Procedure: RELEASE DORSAL COMPARTMENT (DEQUERVAIN);  Surgeon: Vickki Hearing, MD;  Location: [facility];  Service: Orthopedics;  Laterality: Right;   Social History   Social History  . Marital Status: Married    Spouse Name: N/A  . Number of Children: N/A  . Years of Education: N/A   Social History Main Topics  . Smoking status: Never Smoker   . Smokeless tobacco: Current User    Types: Snuff     Comment: 3 dips per day  . Alcohol Use: No  . Drug Use: No  . Sexual Activity: Not Asked   Other Topics Concern  . None   Social History Narrative   Outpatient Encounter Prescriptions as of 12/24/2014  Medication Sig  . APIDRA SOLOSTAR 100 UNIT/ML SOPN Inject  5-11 mLs into the skin 3 (three) times daily with meals.  Marland Kitchen aspirin 325 MG tablet Take 325 mg by mouth daily.  Marland Kitchen atorvastatin (LIPITOR) 20 MG tablet TAKE 1 TABLET AT BEDTIME  . calcium-vitamin D (OSCAL WITH D) 500-200 MG-UNIT per tablet Take 1 tablet by mouth daily.  . captopril (CAPOTEN) 25 MG tablet Take 25 mg by mouth daily.  . carvedilol (COREG) 12.5 MG tablet TAKE 1/2 TABLET BY MOUTH TWICE A DAY WITH A MEAL  . Cholecalciferol (VITAMIN D PO) Take 1 tablet by mouth daily.  . insulin glargine (LANTUS) 100 UNIT/ML injection Inject 30 Units into the skin at bedtime.  . Multiple Vitamin (MULITIVITAMIN WITH MINERALS) TABS Take 1 tablet by mouth daily.   No facility-administered encounter medications on file as of 12/24/2014.   ALLERGIES: No Known Allergies VACCINATION STATUS:  There is no immunization history on file for this patient.  Diabetes She presents for her follow-up diabetic visit. She has type 2 diabetes mellitus. Onset time: She was diagnosed at approximate age of 26 years. Her disease course has been stable. There are no hypoglycemic associated symptoms. Pertinent negatives for hypoglycemia include no confusion, headaches, pallor or seizures. There are no diabetic associated symptoms. Pertinent negatives for diabetes include no chest pain, no polydipsia, no polyphagia and no polyuria. There are no hypoglycemic complications. Symptoms are stable. Diabetic complications include nephropathy. Risk factors for coronary artery disease include diabetes mellitus, dyslipidemia, hypertension, sedentary lifestyle and tobacco  exposure. Current diabetic treatment includes intensive insulin program. She is compliant with treatment most of the time. Her weight is stable. She is following a generally unhealthy diet. She has not had a previous visit with a dietitian (She declined a referral to CDE.). Her breakfast blood glucose range is generally 180-200 mg/dl. Her lunch blood glucose range is generally  180-200 mg/dl. Her dinner blood glucose range is generally 180-200 mg/dl. Her highest blood glucose is 180-200 mg/dl. Her overall blood glucose range is 180-200 mg/dl. An ACE inhibitor/angiotensin II receptor blocker is being taken. Eye exam is current.  Hyperlipidemia This is a chronic problem. The current episode started more than 1 year ago. Pertinent negatives include no chest pain, myalgias or shortness of breath. Current antihyperlipidemic treatment includes statins. Risk factors for coronary artery disease include dyslipidemia, diabetes mellitus and a sedentary lifestyle.  Hypertension This is a chronic problem. The current episode started more than 1 year ago. The problem is controlled. Pertinent negatives include no chest pain, headaches, palpitations or shortness of breath. Past treatments include ACE inhibitors. Hypertensive end-organ damage includes kidney disease.     Review of Systems  Constitutional: Negative for unexpected weight change.  HENT: Negative for trouble swallowing and voice change.   Eyes: Negative for visual disturbance.  Respiratory: Negative for cough, shortness of breath and wheezing.   Cardiovascular: Negative for chest pain, palpitations and leg swelling.  Gastrointestinal: Negative for nausea, vomiting and diarrhea.  Endocrine: Negative for cold intolerance, heat intolerance, polydipsia, polyphagia and polyuria.  Musculoskeletal: Negative for myalgias and arthralgias.  Skin: Negative for color change, pallor, rash and wound.  Neurological: Negative for seizures and headaches.  Psychiatric/Behavioral: Negative for suicidal ideas and confusion.    Objective:    BP 121/72 mmHg  Pulse 61  Ht 6' (1.829 m)  Wt 206 lb (93.441 kg)  BMI 27.93 kg/m2  SpO2 96%  Wt Readings from Last 3 Encounters:  12/24/14 206 lb (93.441 kg)  04/05/14 206 lb (93.441 kg)  08/21/13 207 lb (93.895 kg)    Physical Exam  Constitutional: She is oriented to person, place, and  time. She appears well-developed.  HENT:  Head: Normocephalic and atraumatic.  Eyes: EOM are normal.  Neck: Normal range of motion. Neck supple. No tracheal deviation present. No thyromegaly present.  Cardiovascular: Normal rate and regular rhythm.   Pulmonary/Chest: Effort normal and breath sounds normal.  Abdominal: Soft. Bowel sounds are normal. There is no tenderness. There is no guarding.  Musculoskeletal: Normal range of motion. She exhibits no edema.  Neurological: She is alert and oriented to person, place, and time. She has normal reflexes. No cranial nerve deficit. Coordination normal.  Skin: Skin is warm and dry. No rash noted. No erythema. No pallor.  Psychiatric: She has a normal mood and affect. Judgment normal.    Results for orders placed or performed in visit on 12/24/14  Hemoglobin A1c  Result Value Ref Range   Hgb A1c MFr Bld 7.7 (A) 4.0 - 6.0 %   Complete Blood Count (Most recent): Lab Results  Component Value Date   WBC 8.8 08/07/2013   HGB 11.2* 08/07/2013   HCT 35.1* 08/07/2013   MCV 78.9 08/07/2013   PLT 252 08/07/2013   Chemistry (most recent): Lab Results  Component Value Date   NA 142 08/07/2013   K 4.5 08/07/2013   CL 105 08/07/2013   CO2 27 08/07/2013   BUN 14 08/07/2013   CREATININE 1.02 08/07/2013   Diabetic Labs (most recent):  Lab Results  Component Value Date   HGBA1C 7.7* 12/17/2014   HGBA1C 8.2* 07/21/2010   Lipid profile (most recent): No results found for: TRIG, CHOL       Assessment & Plan:   1. Type 2 diabetes mellitus with stage 2 chronic kidney disease, with long-term current use of insulin (HCC)  - her diabetes is  complicated by stable CK D and patient remains at a high risk for more acute and chronic complications of diabetes which include CAD, CVA, CKD, retinopathy, and neuropathy. These are all discussed in detail with the patient.  Patient came with improved and consistent glucose profile, and  recent A1c of 7.7 %.   Glucose logs and insulin administration records pertaining to this visit,  to be scanned into patient's records.  Recent labs reviewed.   - I have re-counseled the patient on diet management and weight loss  by adopting a carbohydrate restricted / protein rich  Diet.  - Suggestion is made for patient to avoid simple carbohydrates   from their diet including Cakes , Desserts, Ice Cream,  Soda (  diet and regular) , Sweet Tea , Candies,  Chips, Cookies, Artificial Sweeteners,   and "Sugar-free" Products .  This will help patient to have stable blood glucose profile and potentially avoid unintended  Weight gain.  - Patient is advised to stick to a routine mealtimes to eat 3 meals  a day and avoid unnecessary snacks ( to snack only to correct hypoglycemia).  - The patient  declined referral to a  CDE for individualized DM education.  - I have approached patient with the following individualized plan to manage diabetes and patient agrees.  - I will continue Lantus 30 units qhs, Apidra  5 units 3 times a day before meals plus correction associated with monitoring of BG AC and HS.  -Adjustment parameters for hypo and hyperglycemia were given in a written document to patient. -Patient is encouraged to call clinic for blood glucose levels less than 70 or above 300 mg /dl. -She could not afford Invokana.  - Patient specific target  for A1c; LDL, HDL, Triglycerides, and  Waist Circumference were discussed in detail.  2) BP/HTN: Controlled. Continue current medications including ACEI/ARB. 3) Lipids/HPL:  continue statins. 4)  Weight/Diet: CDE consult in progress, exercise, and carbohydrates information provided.  5) Chronic Care/Health Maintenance:  -Patient is on ACEI/ARB and Statin medications and encouraged to continue to follow up with Ophthalmology, Podiatrist at least yearly or according to recommendations, and advised to  stay away from smoking. I have recommended yearly flu vaccine and  pneumonia vaccination at least every 5 years; moderate intensity exercise for up to 150 minutes weekly; and  sleep for at least 7 hours a day.  - 25 minutes of time was spent on the care of this patient , 50% of which was applied for counseling on diabetes complications and their preventions.  - I advised patient to maintain close follow up with Colette Ribas, MD for primary care needs.  Patient is asked to bring meter and  blood glucose logs during their next visit.   Follow up plan: -Return in about 3 months (around 03/24/2015) for diabetes, high blood pressure, high cholesterol, follow up with pre-visit labs, meter, and logs.  Marquis Lunch, MD Phone: [phone]  Fax: [phone]   12/24/2014, 1:42 PM

## 2014-12-24 NOTE — Patient Instructions (Signed)

## 2014-12-25 ENCOUNTER — Other Ambulatory Visit: Payer: Self-pay | Admitting: "Endocrinology

## 2014-12-29 ENCOUNTER — Other Ambulatory Visit: Payer: Self-pay | Admitting: "Endocrinology

## 2015-01-07 ENCOUNTER — Telehealth: Payer: Self-pay | Admitting: Cardiovascular Disease

## 2015-01-07 ENCOUNTER — Telehealth: Payer: Self-pay | Admitting: Cardiology

## 2015-01-07 ENCOUNTER — Ambulatory Visit (INDEPENDENT_AMBULATORY_CARE_PROVIDER_SITE_OTHER): Payer: Medicare Other | Admitting: *Deleted

## 2015-01-07 DIAGNOSIS — I429 Cardiomyopathy, unspecified: Secondary | ICD-10-CM | POA: Diagnosis not present

## 2015-01-07 DIAGNOSIS — I428 Other cardiomyopathies: Secondary | ICD-10-CM

## 2015-01-07 NOTE — Telephone Encounter (Signed)
New MEssage  Pt requested to speak w/ RN concerning remote transmission- pt stated the monitor is blinking. Please call back and discuss.

## 2015-01-07 NOTE — Telephone Encounter (Signed)
Spoke with pt and reminded pt of remote transmission that is due today. Pt verbalized understanding.   

## 2015-01-07 NOTE — Telephone Encounter (Signed)
Instructed pt to call tech services to receive help trouble shooting home monitor. Pt verbalized understanding.  

## 2015-01-08 NOTE — Progress Notes (Signed)
Remote ICD transmission.   

## 2015-01-09 LAB — CUP PACEART REMOTE DEVICE CHECK
Battery Voltage: 3.02 V
Brady Statistic AP VP Percent: 0.02 %
Brady Statistic AS VP Percent: 0.01 %
Brady Statistic RA Percent Paced: 82.81 %
Brady Statistic RV Percent Paced: 0.03 %
Date Time Interrogation Session: 20161219221859
HIGH POWER IMPEDANCE MEASURED VALUE: 399 Ohm
HIGH POWER IMPEDANCE MEASURED VALUE: 56 Ohm
HighPow Impedance: 44 Ohm
Implantable Lead Implant Date: 20120702
Implantable Lead Location: 753859
Implantable Lead Location: 753860
Implantable Lead Model: 5076
Implantable Lead Model: 6947
Lead Channel Impedance Value: 456 Ohm
Lead Channel Pacing Threshold Amplitude: 0.625 V
Lead Channel Pacing Threshold Pulse Width: 0.4 ms
Lead Channel Pacing Threshold Pulse Width: 0.4 ms
Lead Channel Sensing Intrinsic Amplitude: 2.75 mV
Lead Channel Sensing Intrinsic Amplitude: 9.875 mV
Lead Channel Setting Pacing Amplitude: 2 V
Lead Channel Setting Pacing Pulse Width: 0.4 ms
Lead Channel Setting Sensing Sensitivity: 0.3 mV
MDC IDC LEAD IMPLANT DT: 20120702
MDC IDC MSMT LEADCHNL RA IMPEDANCE VALUE: 475 Ohm
MDC IDC MSMT LEADCHNL RA PACING THRESHOLD AMPLITUDE: 1 V
MDC IDC MSMT LEADCHNL RA SENSING INTR AMPL: 2.75 mV
MDC IDC MSMT LEADCHNL RV SENSING INTR AMPL: 9.875 mV
MDC IDC SET LEADCHNL RA PACING AMPLITUDE: 2 V
MDC IDC STAT BRADY AP VS PERCENT: 82.79 %
MDC IDC STAT BRADY AS VS PERCENT: 17.18 %

## 2015-01-10 ENCOUNTER — Encounter: Payer: Self-pay | Admitting: Cardiology

## 2015-03-14 ENCOUNTER — Other Ambulatory Visit: Payer: Self-pay | Admitting: "Endocrinology

## 2015-03-15 ENCOUNTER — Other Ambulatory Visit: Payer: Self-pay

## 2015-03-15 MED ORDER — INSULIN LISPRO 100 UNIT/ML (KWIKPEN): 5.0000 [IU] | PEN_INJECTOR | Freq: Three times a day (TID) | SUBCUTANEOUS | Status: DC

## 2015-03-18 ENCOUNTER — Other Ambulatory Visit: Payer: Self-pay | Admitting: "Endocrinology

## 2015-03-18 LAB — BASIC METABOLIC PANEL
BUN: 11 mg/dL (ref 7–25)
CALCIUM: 9.1 mg/dL (ref 8.6–10.4)
CO2: 27 mmol/L (ref 20–31)
CREATININE: 0.85 mg/dL (ref 0.60–0.93)
Chloride: 106 mmol/L (ref 98–110)
GLUCOSE: 219 mg/dL — AB (ref 65–99)
Potassium: 4.3 mmol/L (ref 3.5–5.3)
Sodium: 143 mmol/L (ref 135–146)

## 2015-03-19 LAB — HEMOGLOBIN A1C
Hgb A1c MFr Bld: 8.9 % — ABNORMAL HIGH (ref <5.7)
Mean Plasma Glucose: 209 mg/dL — ABNORMAL HIGH (ref <117)

## 2015-03-25 ENCOUNTER — Encounter: Payer: Self-pay | Admitting: "Endocrinology

## 2015-03-25 ENCOUNTER — Ambulatory Visit (INDEPENDENT_AMBULATORY_CARE_PROVIDER_SITE_OTHER): Payer: Medicare Other | Admitting: "Endocrinology

## 2015-03-25 VITALS — BP 125/68 | HR 77 | Ht 67.0 in | Wt 204.0 lb

## 2015-03-25 DIAGNOSIS — Z794 Long term (current) use of insulin: Secondary | ICD-10-CM

## 2015-03-25 DIAGNOSIS — E1122 Type 2 diabetes mellitus with diabetic chronic kidney disease: Secondary | ICD-10-CM

## 2015-03-25 DIAGNOSIS — E785 Hyperlipidemia, unspecified: Secondary | ICD-10-CM

## 2015-03-25 DIAGNOSIS — I10 Essential (primary) hypertension: Secondary | ICD-10-CM

## 2015-03-25 MED ORDER — INSULIN ASPART PROT & ASPART (70-30 MIX) 100 UNIT/ML PEN: 15.0000 [IU] | PEN_INJECTOR | Freq: Two times a day (BID) | SUBCUTANEOUS | Status: DC

## 2015-03-25 NOTE — Progress Notes (Signed)
Subjective:    Patient ID: Diana Campbell, female    DOB: May 03, 1945, PCP Colette Ribas, MD   Past Medical History  Diagnosis Date  . Cardiac abnormality     with defibrilator  . Diabetes mellitus   . Hypertension   . Hypercholesteremia   . H/O cardiac arrest 2006  . Automatic implantable cardioverter-defibrillator in situ 07/2010   Past Surgical History  Procedure Laterality Date  . Cardiac defibrillator placement  07/21/2010    Medtronic Protecta XT DR, model#[id number], serial#[id number]  . Colonoscopy  03/30/2011    Procedure: COLONOSCOPY;  Surgeon: Dalia Heading, MD;  Location: [facility];  Service: Gastroenterology;  Laterality: N/A;  . 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  . Cardiac catheterization  04/14/2000    Normal coronary arteries.  . Cardiovascular stress test  04/05/2000    Scintigraphic evidence of mild LV dilatation and global LV dysfunction with an EF 41% with mild inferolateral ischemia.  . Transthoracic echocardiogram  08/03/2011    EF 45-50%, mildly reduced LV systolic function.  . Dorsal compartment release Right 08/11/2013    Procedure: RELEASE DORSAL COMPARTMENT (DEQUERVAIN);  Surgeon: Vickki Hearing, MD;  Location: [facility];  Service: Orthopedics;  Laterality: Right;   Social History   Social History  . Marital Status: Married    Spouse Name: N/A  . Number of Children: N/A  . Years of Education: N/A   Social History Main Topics  . Smoking status: Never Smoker   . Smokeless tobacco: Current User    Types: Snuff     Comment: 3 dips per day  . Alcohol Use: No  . Drug Use: No  . Sexual Activity: Not Asked   Other Topics Concern  . None   Social History Narrative   Outpatient Encounter Prescriptions as of 03/25/2015  Medication Sig  . aspirin 325 MG tablet Take 325 mg by mouth  daily.  . calcium-vitamin D (OSCAL WITH D) 500-200 MG-UNIT per tablet Take 1 tablet by mouth daily.  . captopril (CAPOTEN) 25 MG tablet Take 25 mg by mouth daily.  . carvedilol (COREG) 12.5 MG tablet TAKE 1/2 TABLET BY MOUTH TWICE A DAY WITH A MEAL  . Cholecalciferol (VITAMIN D PO) Take 1 tablet by mouth daily.  . Multiple Vitamin (MULITIVITAMIN WITH MINERALS) TABS Take 1 tablet by mouth daily.  . [DISCONTINUED] insulin lispro (HUMALOG KWIKPEN) 100 UNIT/ML KiwkPen Inject 0.05-0.11 mLs (5-11 Units total) into the skin 3 (three) times daily.  Marland Kitchen ACCU-CHEK AVIVA PLUS test strip TEST BLOOD SUGAR 4 TIMES A DAY  . atorvastatin (LIPITOR) 20 MG tablet TAKE 1 TABLET AT BEDTIME  . insulin aspart protamine - aspart (NOVOLOG MIX 70/30 FLEXPEN) (70-30) 100 UNIT/ML FlexPen Inject 0.15 mLs (15 Units total) into the skin 2 (two) times daily with a meal.  . [DISCONTINUED] APIDRA SOLOSTAR 100 UNIT/ML Solostar Pen INJECT UP TO 11 UNITS SUBCUTANEOUSLY 3 TIMES A DAY BEFORE MEALS  . [DISCONTINUED] LANTUS SOLOSTAR 100 UNIT/ML Solostar Pen INJECT 30 UNITS SUBCUTANEOUSLY AT BEDTIME (Patient not taking: Reported on 03/25/2015)   No facility-administered encounter medications on file as of 03/25/2015.   ALLERGIES: No Known Allergies VACCINATION STATUS:  There is no immunization history on file for this patient.  Diabetes She presents for her follow-up diabetic visit. She has type 2 diabetes mellitus. Onset time: She was diagnosed at approximate  age of 50 years. Her disease course has been worsening. There are no hypoglycemic associated symptoms. Pertinent negatives for hypoglycemia include no confusion, headaches, pallor or seizures. There are no diabetic associated symptoms. Pertinent negatives for diabetes include no chest pain, no polydipsia, no polyphagia and no polyuria. There are no hypoglycemic complications. Symptoms are worsening. Diabetic complications include nephropathy. Risk factors for coronary artery disease  include diabetes mellitus, dyslipidemia, hypertension, sedentary lifestyle and tobacco exposure. Current diabetic treatment includes intensive insulin program (For unclear reasons, she stayed off of her Lantus.). She is compliant with treatment some of the time. Her weight is stable. She is following a generally unhealthy diet. She has not had a previous visit with a dietitian (She declined a referral to CDE.). Her breakfast blood glucose range is generally 180-200 mg/dl. Her lunch blood glucose range is generally 180-200 mg/dl. Her dinner blood glucose range is generally 180-200 mg/dl. Her highest blood glucose is 180-200 mg/dl. Her overall blood glucose range is 180-200 mg/dl. An ACE inhibitor/angiotensin II receptor blocker is being taken. Eye exam is current.  Hyperlipidemia This is a chronic problem. The current episode started more than 1 year ago. Pertinent negatives include no chest pain, myalgias or shortness of breath. Current antihyperlipidemic treatment includes statins. Risk factors for coronary artery disease include dyslipidemia, diabetes mellitus and a sedentary lifestyle.  Hypertension This is a chronic problem. The current episode started more than 1 year ago. The problem is controlled. Pertinent negatives include no chest pain, headaches, palpitations or shortness of breath. Past treatments include ACE inhibitors. Hypertensive end-organ damage includes kidney disease.     Review of Systems  Constitutional: Negative for unexpected weight change.  HENT: Negative for trouble swallowing and voice change.   Eyes: Negative for visual disturbance.  Respiratory: Negative for cough, shortness of breath and wheezing.   Cardiovascular: Negative for chest pain, palpitations and leg swelling.  Gastrointestinal: Negative for nausea, vomiting and diarrhea.  Endocrine: Negative for cold intolerance, heat intolerance, polydipsia, polyphagia and polyuria.  Musculoskeletal: Negative for myalgias and  arthralgias.  Skin: Negative for color change, pallor, rash and wound.  Neurological: Negative for seizures and headaches.  Psychiatric/Behavioral: Negative for suicidal ideas and confusion.    Objective:    BP 125/68 mmHg  Pulse 77  Ht 5\' 7"  (1.702 m)  Wt 204 lb (92.534 kg)  BMI 31.94 kg/m2  SpO2 97%  Wt Readings from Last 3 Encounters:  03/25/15 204 lb (92.534 kg)  12/24/14 206 lb (93.441 kg)  04/05/14 206 lb (93.441 kg)    Physical Exam  Constitutional: She is oriented to person, place, and time. She appears well-developed.  HENT:  Head: Normocephalic and atraumatic.  Eyes: EOM are normal.  Neck: Normal range of motion. Neck supple. No tracheal deviation present. No thyromegaly present.  Cardiovascular: Normal rate and regular rhythm.   Pulmonary/Chest: Effort normal and breath sounds normal.  Abdominal: Soft. Bowel sounds are normal. There is no tenderness. There is no guarding.  Musculoskeletal: Normal range of motion. She exhibits no edema.  Neurological: She is alert and oriented to person, place, and time. She has normal reflexes. No cranial nerve deficit. Coordination normal.  Skin: Skin is warm and dry. No rash noted. No erythema. No pallor.  Psychiatric: She has a normal mood and affect. Judgment normal.    Results for orders placed or performed in visit on 03/18/15  Basic metabolic panel  Result Value Ref Range   Sodium 143 135 - 146 mmol/L   Potassium  4.3 3.5 - 5.3 mmol/L   Chloride 106 98 - 110 mmol/L   CO2 27 20 - 31 mmol/L   Glucose, Bld 219 (H) 65 - 99 mg/dL   BUN 11 7 - 25 mg/dL   Creat 1.61 0.96 - 0.45 mg/dL   Calcium 9.1 8.6 - 40.9 mg/dL  Hemoglobin W1X  Result Value Ref Range   Hgb A1c MFr Bld 8.9 (H) <5.7 %   Mean Plasma Glucose 209 (H) <117 mg/dL  Diabetic Labs (most recent): Lab Results  Component Value Date   HGBA1C 8.9* 03/18/2015   HGBA1C 7.7* 12/17/2014   HGBA1C 8.2* 07/21/2010     Assessment & Plan:   1. Type 2 diabetes mellitus  with stage 2 chronic kidney disease, with long-term current use of insulin (HCC)  - her diabetes is  complicated by stable CK D and patient remains at a high risk for more acute and chronic complications of diabetes which include CAD, CVA, CKD, retinopathy, and neuropathy. These are all discussed in detail with the patient.  Patient came with improved and consistent glucose profile, and  recent A1c  8.9% worsening from  7.7 %.  Glucose logs and insulin administration records pertaining to this visit,  to be scanned into patient's records.  Recent labs reviewed.   - I have re-counseled the patient on diet management and weight loss  by adopting a carbohydrate restricted / protein rich  Diet.  - Suggestion is made for patient to avoid simple carbohydrates   from their diet including Cakes , Desserts, Ice Cream,  Soda (  diet and regular) , Sweet Tea , Candies,  Chips, Cookies, Artificial Sweeteners,   and "Sugar-free" Products .  This will help patient to have stable blood glucose profile and potentially avoid unintended  Weight gain.  - Patient is advised to stick to a routine mealtimes to eat 3 meals  a day and avoid unnecessary snacks ( to snack only to correct hypoglycemia).  - The patient  declined referral to a  CDE for individualized DM education.  - I have approached patient with the following individualized plan to manage diabetes and patient agrees.  - She does not want to resume Lantus. I discussed her other options. I will switch to NovoLog 70/3015 units with breakfast and 15 units with supper associated with associated with monitoring of BG AC and HS.  -I advised her to discontinue Humalog, and Lantus.  -Adjustment parameters for hypo and hyperglycemia were given in a written document to patient. -Patient is encouraged to call clinic for blood glucose levels less than 70 or above 300 mg /dl. -She could not afford Invokana.  - Patient specific target  for A1c; LDL, HDL,  Triglycerides, and  Waist Circumference were discussed in detail.  2) BP/HTN: Controlled. Continue current medications including ACEI/ARB. 3) Lipids/HPL:  continue statins. 4)  Weight/Diet: CDE consult in progress, exercise, and carbohydrates information provided.  5) Chronic Care/Health Maintenance:  -Patient is on ACEI/ARB and Statin medications and encouraged to continue to follow up with Ophthalmology, Podiatrist at least yearly or according to recommendations, and advised to  stay away from smoking. I have recommended yearly flu vaccine and pneumonia vaccination at least every 5 years; moderate intensity exercise for up to 150 minutes weekly; and  sleep for at least 7 hours a day.  - 25 minutes of time was spent on the care of this patient , 50% of which was applied for counseling on diabetes complications and their preventions.  -  I advised patient to maintain close follow up with Colette Ribas, MD for primary care needs.  Patient is asked to bring meter and  blood glucose logs during their next visit.   Follow up plan: -Return in about 2 weeks (around 04/08/2015) for diabetes, high blood pressure, high cholesterol, follow up with meter and logs- no labs.  Marquis Lunch, MD Phone: [phone]  Fax: [phone]   03/25/2015, 11:51 AM

## 2015-03-25 NOTE — Patient Instructions (Signed)

## 2015-03-26 ENCOUNTER — Other Ambulatory Visit: Payer: Self-pay

## 2015-03-26 MED ORDER — INSULIN LISPRO PROT & LISPRO (75-25 MIX) 100 UNIT/ML KWIKPEN: 15.0000 [IU] | PEN_INJECTOR | Freq: Two times a day (BID) | SUBCUTANEOUS | Status: DC

## 2015-03-29 ENCOUNTER — Encounter: Payer: Self-pay | Admitting: Cardiovascular Disease

## 2015-03-29 ENCOUNTER — Ambulatory Visit (INDEPENDENT_AMBULATORY_CARE_PROVIDER_SITE_OTHER): Payer: Medicare Other | Admitting: Cardiovascular Disease

## 2015-03-29 VITALS — BP 112/63 | HR 65 | Ht 68.0 in | Wt 201.9 lb

## 2015-03-29 DIAGNOSIS — E1122 Type 2 diabetes mellitus with diabetic chronic kidney disease: Secondary | ICD-10-CM

## 2015-03-29 DIAGNOSIS — I429 Cardiomyopathy, unspecified: Secondary | ICD-10-CM

## 2015-03-29 DIAGNOSIS — I509 Heart failure, unspecified: Secondary | ICD-10-CM

## 2015-03-29 DIAGNOSIS — I10 Essential (primary) hypertension: Secondary | ICD-10-CM

## 2015-03-29 DIAGNOSIS — Z95810 Presence of automatic (implantable) cardiac defibrillator: Secondary | ICD-10-CM

## 2015-03-29 DIAGNOSIS — N182 Chronic kidney disease, stage 2 (mild): Secondary | ICD-10-CM

## 2015-03-29 DIAGNOSIS — I428 Other cardiomyopathies: Secondary | ICD-10-CM

## 2015-03-29 DIAGNOSIS — Z794 Long term (current) use of insulin: Secondary | ICD-10-CM

## 2015-03-29 MED ORDER — FUROSEMIDE 40 MG PO TABS: 40.0000 mg | ORAL_TABLET | Freq: Every day | ORAL | Status: DC

## 2015-03-29 MED ORDER — POTASSIUM CHLORIDE ER 10 MEQ PO TBCR: 10.0000 meq | EXTENDED_RELEASE_TABLET | Freq: Every day | ORAL | Status: DC

## 2015-03-29 NOTE — Progress Notes (Signed)
Patient ID: Diana Campbell, female   DOB: Sep 13, 1945, 70 y.o.   MRN: [id number]    Cardiology Office Note    Date:  03/31/2015   ID:  Diana, Campbell [birth year], MRN [id number]  PCP:  Colette Ribas, MD  Cardiologist:  Nicki Guadalajara, M.D. Thurmon Fair, MD   Chief Complaint  Patient presents with  . Pacemaker Check    Annual visit  pt c/o right ankle swelling occasionally--fractured 3 years ago    History of Present Illness:  Diana Campbell is a 71 y.o. female a remote history of ventricular fibrillation arrest in June 2005, resuscitated, in the setting of nonischemic cardiomyopathy with left ventricular ejection fraction that has improved from 15% at diagnosis to around 45% by most recent evaluation. She has minor coronary atherosclerosis by previous angiography. She had a  Medtronic defibrillator/Sprint Fidelis lead,subsequently had a complete revision of her system with placement of a new right ventricular lead and dual-chamber generator in July 2012.  Additional problems include obstructive sleep apnea, diabetes mellitus on insulin, hypertension and hyperlipidemia.  Shee complains of feeling "tired" when she walks out to the trash cans to throw a garbage. I think she is describing dyspnea on exertion. She reports some swelling in her right ankle which she attributes to a previous fracture, but no persistent leg edema. I believe she has edema in both ankles. She denies fibrillator discharges, angina, palpitations, syncope, focal neurological events or intermittent claudication.  Related interrogation shows normal function, normal lead parameters, no ventricular tachycardia or episodes of atrial mode switch, atrial pacing occurs 80% of the time and there is no ventricular pacing. Generator voltage is 3.0 V, RRT 2.63 V. Activities fairly constant 2.2 hours per day with a favorable heart rate histogram distribution.  The most important finding is a fairly significant drop in  thoracic impedance with optivol exceeding the threshold since late January.  Her husband reports that she is very noncompliant with sodium restriction and add salt to all her foods and likes to eat fast food. He is also concerned that she is not active enough. She reports that her inactivity is due to a pinched nerve and hip pain  Past Medical History  Diagnosis Date  . Cardiac abnormality     with defibrilator  . Diabetes mellitus   . Hypertension   . Hypercholesteremia   . H/O cardiac arrest 2006  . Automatic implantable cardioverter-defibrillator in situ 07/2010    Past Surgical History  Procedure Laterality Date  . Cardiac defibrillator placement  07/21/2010    Medtronic Protecta XT DR, model#[id number], serial#[id number]  . Colonoscopy  03/30/2011    Procedure: COLONOSCOPY;  Surgeon: Dalia Heading, MD;  Location: [facility];  Service: Gastroenterology;  Laterality: N/A;  . 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  . Cardiac catheterization  04/14/2000    Normal coronary arteries.  . Cardiovascular stress test  04/05/2000    Scintigraphic evidence of mild LV dilatation and global LV dysfunction with an EF 41% with mild inferolateral ischemia.  . Transthoracic echocardiogram  08/03/2011    EF 45-50%, mildly reduced LV systolic function.  . Dorsal compartment release Right 08/11/2013    Procedure: RELEASE DORSAL COMPARTMENT (DEQUERVAIN);  Surgeon: Vickki Hearing, MD;  Location: [facility];  Service: Orthopedics;  Laterality: Right;    Outpatient Prescriptions Prior to Visit  Medication Sig Dispense  Refill  . ACCU-CHEK AVIVA PLUS test strip TEST BLOOD SUGAR 4 TIMES A DAY 150 each 5  . aspirin 325 MG tablet Take 325 mg by mouth daily.    Marland Kitchen atorvastatin (LIPITOR) 20 MG tablet TAKE 1 TABLET AT BEDTIME 30 tablet 9  . calcium-vitamin D  (OSCAL WITH D) 500-200 MG-UNIT per tablet Take 1 tablet by mouth daily.    . captopril (CAPOTEN) 25 MG tablet Take 25 mg by mouth daily.    . carvedilol (COREG) 12.5 MG tablet TAKE 1/2 TABLET BY MOUTH TWICE A DAY WITH A MEAL 30 tablet 11  . Cholecalciferol (VITAMIN D PO) Take 1 tablet by mouth daily.    . Insulin Lispro Prot & Lispro (HUMALOG MIX 75/25 KWIKPEN) (75-25) 100 UNIT/ML Kwikpen Inject 15 Units into the skin 2 (two) times daily. 15 mL 2  . Multiple Vitamin (MULITIVITAMIN WITH MINERALS) TABS Take 1 tablet by mouth daily.    . insulin aspart protamine - aspart (NOVOLOG MIX 70/30 FLEXPEN) (70-30) 100 UNIT/ML FlexPen Inject 0.15 mLs (15 Units total) into the skin 2 (two) times daily with a meal. 15 mL 2   No facility-administered medications prior to visit.     Allergies:   Review of patient's allergies indicates no known allergies.   Social History   Social History  . Marital Status: Married    Spouse Name: N/A  . Number of Children: N/A  . Years of Education: N/A   Social History Main Topics  . Smoking status: Never Smoker   . Smokeless tobacco: Current User    Types: Snuff     Comment: 3 dips per day  . Alcohol Use: No  . Drug Use: No  . Sexual Activity: Not Asked   Other Topics Concern  . None   Social History Narrative     ROS:   Please see the history of present illness.    ROS All other systems reviewed and are negative.   PHYSICAL EXAM:   VS:  BP 112/63 mmHg  Pulse 65  Ht  (1.727 m)  Wt 91.581 kg (201 lb 14.4 oz)  BMI 30.71 kg/m2   GEN: Well nourished, well developed, in no acute distress HEENT: normal Neck: 6-8 cm elevation of JVD, carotid bruits, or masses Cardiac: RRR; no murmurs, rubs, or gallops, 2+ bilateral ankle edema  Respiratory:  clear to auscultation bilaterally, normal work of breathing GI: soft, nontender, nondistended, + BS MS: no deformity or atrophy Skin: warm and dry, no rash Neuro:  Alert and Oriented x 3, Strength and  sensation are intact Psych: euthymic mood, full affect  Wt Readings from Last 3 Encounters:  03/29/15 91.581 kg (201 lb 14.4 oz)  03/25/15 92.534 kg (204 lb)  12/24/14 93.441 kg (206 lb)      Studies/Labs Reviewed:   EKG:  EKG is not ordered today  Recent Labs: 03/18/2015: BUN 11; Creat 0.85; Potassium 4.3; Sodium 143   Lipid Panel No results found for: CHOL, TRIG, HDL, CHOLHDL, VLDL, LDLCALC, LDLDIRECT   ASSESSMENT:    1. Cardiomyopathy, nonischemic (HCC)   2. Essential hypertension, benign   3. ICD - dual chamber Medtronic, July 2012   4. Type 2 diabetes mellitus with stage 2 chronic kidney disease, with long-term current use of insulin (HCC)      PLAN:  In order of problems listed above:   1.CMP/CHF: Appears to have mild decompensation which probably explains her fatigue/dyspnea and is confirmed by a decrease in thoracic impedance by  device measurement. Reviewed the signs and symptoms of heart failure decompensation. Discussed the fact that she is to be much more compliant with sodium restriction, daily weight monitoring. We discussed a way to adjust her diuretic dose based on her weight. Have selected an initial target weight of less than 195 pounds.  2. HTN: Excellent blood pressure control  3. ICD: Normal device function. Continue [facility] every 3 months and yearly office visits  4. CKD: Cautious adjustment of diuretics with monitoring of renal function due to the presence of diabetic nephropathy and chronic kidney disease    Medication Adjustments/Labs and Tests Ordered: Current medicines are reviewed at length with the patient today.  Concerns regarding medicines are outlined above.  Medication changes, Labs and Tests ordered today are listed in the Patient Instructions below. Patient Instructions  Your physician has recommended you make the following change in your medication:   START FUROSEMIDE 40 MG ONE A DAY IF YOUR WEIGHT IS GREATER THAN 195 LBS.  TAKE THE  POTASSIUM 10 MEQ DAILY ONLY WHEN YOU TAKE THE FUROSEMIDE   Your physician recommends that you schedule a follow-up appointment in: NEXT AVAILABLE WITH DR. Tresa Endo  Dr. Royann Shivers recommends that you schedule a follow-up appointment in: 3 MONTHS WITH PACEMAKER CHECK (MEDTRONIC-BLUE).     Your physician recommends that you weigh, daily, at the same time every day, and in the same amount of clothing. Please record your daily weights on the handout provided and bring it to your next appointment.  Daily Weight Record It is important to weigh yourself daily. Keep this daily weight chart near your scale. Weigh yourself each morning at the same time. Weigh yourself without shoes, and wear the same amount of clothing each day. Compare today's weight to yesterday's weight. Bring this form with you to your follow-up appointments. Call your health care provider if you have concerns about your weight, including rapid weight gain or rapid weight loss. Date: ________ Weight: ____________________ Date: ________ Weight: ____________________ Date: ________ Weight: ____________________ Date: ________ Weight: ____________________ Date: ________ Weight: ____________________ Date: ________ Weight: ____________________ Date: ________ Weight: ____________________ Date: ________ Weight: ____________________ Date: ________ Weight: ____________________ Date: ________ Weight: ____________________ Date: ________ Weight: ____________________ Date: ________ Weight: ____________________ Date: ________ Weight: ____________________ Date: ________ Weight: ____________________ Date: ________ Weight: ____________________ Date: ________ Weight: ____________________ Date: ________ Weight: ____________________ Date: ________ Weight: ____________________ Date: ________ Weight: ____________________ Date: ________ Weight: ____________________ Date: ________ Weight: ____________________ Date: ________ Weight: ____________________ Date:  ________ Weight: ____________________ Date: ________ Weight: ____________________ Date: ________ Weight: ____________________ Date: ________ Weight: ____________________ Date: ________ Weight: ____________________ Date: ________ Weight: ____________________ Date: ________ Weight: ____________________ Date: ________ Weight: ____________________ Date: ________ Weight: ____________________ Date: ________ Weight: ____________________ Date: ________ Weight: ____________________ Date: ________ Weight: ____________________ Date: ________ Weight: ____________________ Date: ________ Weight: ____________________ Date: ________ Weight: ____________________ Date: ________ Weight: ____________________ Date: ________ Weight: ____________________ Date: ________ Weight: ____________________ Date: ________ Weight: ____________________ Date: ________ Weight: ____________________ Date: ________ Weight: ____________________ Date: ________ Weight: ____________________ Date: ________ Weight: ____________________ Date: ________ Weight: ____________________ Date: ________ Weight: ____________________ Date: ________ Weight: ____________________ Date: ________ Weight: ____________________ Date: ________ Weight: ____________________   This information is not intended to replace advice given to you by your health care provider. Make sure you discuss any questions you have with your health care provider.   Document Released: 03/19/2006 Document Revised: 01/26/2014 Document Reviewed: 08/04/2013 Elsevier Interactive Patient Education 2016 [employer]    Low-Sodium Eating Plan Sodium raises blood pressure and causes water to be held in the  body. Getting less sodium from food will help lower your blood pressure, reduce any swelling, and protect your heart, liver, and kidneys. We get sodium by adding salt (sodium chloride) to food. Most of our sodium comes from canned, boxed, and frozen foods. Restaurant foods, fast foods,  and pizza are also very high in sodium. Even if you take medicine to lower your blood pressure or to reduce fluid in your body, getting less sodium from your food is important. WHAT IS MY PLAN? Most people should limit their sodium intake to 2,300 mg a day. Your health care provider recommends that you limit your sodium intake to ___2000_______ a day.  WHAT DO I NEED TO KNOW ABOUT THIS EATING PLAN? For the low-sodium eating plan, you will follow these general guidelines:  Choose foods with a % Daily Value for sodium of less than 5% (as listed on the food label).   Use salt-free seasonings or herbs instead of table salt or sea salt.   Check with your health care provider or pharmacist before using salt substitutes.   Eat fresh foods.  Eat more vegetables and fruits.  Limit canned vegetables. If you do use them, rinse them well to decrease the sodium.   Limit cheese to 1 oz (28 g) per day.   Eat lower-sodium products, often labeled as "lower sodium" or "no salt added."  Avoid foods that contain monosodium glutamate (MSG). MSG is sometimes added to [place] food and some canned foods.  Check food labels (Nutrition Facts labels) on foods to learn how much sodium is in one serving.  Eat more home-cooked food and less restaurant, buffet, and fast food.  When eating at a restaurant, ask that your food be prepared with less salt, or no salt if possible.  HOW DO I READ FOOD LABELS FOR SODIUM INFORMATION? The Nutrition Facts label lists the amount of sodium in one serving of the food. If you eat more than one serving, you must multiply the listed amount of sodium by the number of servings. Food labels may also identify foods as:  Sodium free--Less than 5 mg in a serving.  Very low sodium--35 mg or less in a serving.  Low sodium--140 mg or less in a serving.  Light in sodium--50% less sodium in a serving. For example, if a food that usually has 300 mg of sodium is changed to  become light in sodium, it will have 150 mg of sodium.  Reduced sodium--25% less sodium in a serving. For example, if a food that usually has 400 mg of sodium is changed to reduced sodium, it will have 300 mg of sodium. WHAT FOODS CAN I EAT? Grains Low-sodium cereals, including oats, puffed wheat and rice, and shredded wheat cereals. Low-sodium crackers. Unsalted rice and pasta. Lower-sodium bread.  Vegetables Frozen or fresh vegetables. Low-sodium or reduced-sodium canned vegetables. Low-sodium or reduced-sodium tomato sauce and paste. Low-sodium or reduced-sodium tomato and vegetable juices.  Fruits Fresh, frozen, and canned fruit. Fruit juice.  Meat and Other Protein Products Low-sodium canned tuna and salmon. Fresh or frozen meat, poultry, seafood, and fish. Lamb. Unsalted nuts. Dried beans, peas, and lentils without added salt. Unsalted canned beans. Homemade soups without salt. Eggs.  Dairy Milk. Soy milk. Ricotta cheese. Low-sodium or reduced-sodium cheeses. Yogurt.  Condiments Fresh and dried herbs and spices. Salt-free seasonings. Onion and garlic powders. Low-sodium varieties of mustard and ketchup. Fresh or refrigerated horseradish. Lemon juice.  Fats and Oils Reduced-sodium salad dressings. Unsalted butter.  Other Unsalted popcorn  and pretzels.  The items listed above may not be a complete list of recommended foods or beverages. Contact your dietitian for more options. WHAT FOODS ARE NOT RECOMMENDED? Grains Instant hot cereals. Bread stuffing, pancake, and biscuit mixes. Croutons. Seasoned rice or pasta mixes. Noodle soup cups. Boxed or frozen macaroni and cheese. Self-rising flour. Regular salted crackers. Vegetables Regular canned vegetables. Regular canned tomato sauce and paste. Regular tomato and vegetable juices. Frozen vegetables in sauces. Salted [place] fries. Olives. Rosita Fire. Relishes. Sauerkraut. Salsa. Meat and Other Protein Products Salted, canned,  smoked, spiced, or pickled meats, seafood, or fish. Bacon, ham, sausage, hot dogs, corned beef, chipped beef, and packaged luncheon meats. Salt pork. Jerky. Pickled herring. Anchovies, regular canned tuna, and sardines. Salted nuts. Dairy Processed cheese and cheese spreads. Cheese curds. Blue cheese and cottage cheese. Buttermilk.  Condiments Onion and garlic salt, seasoned salt, table salt, and sea salt. Canned and packaged gravies. Worcestershire sauce. Tartar sauce. Barbecue sauce. Teriyaki sauce. Soy sauce, including reduced sodium. Steak sauce. Fish sauce. Oyster sauce. Cocktail sauce. Horseradish that you find on the shelf. Regular ketchup and mustard. Meat flavorings and tenderizers. Bouillon cubes. Hot sauce. Tabasco sauce. Marinades. Taco seasonings. Relishes. Fats and Oils Regular salad dressings. Salted butter. Margarine. Ghee. Bacon fat.  Other Potato and tortilla chips. Corn chips and puffs. Salted popcorn and pretzels. Canned or dried soups. Pizza. Frozen entrees and pot pies.  The items listed above may not be a complete list of foods and beverages to avoid. Contact your dietitian for more information.   This information is not intended to replace advice given to you by your health care provider. Make sure you discuss any questions you have with your health care provider.   Document Released: 06/27/2001 Document Revised: 01/26/2014 Document Reviewed: 11/09/2012 Elsevier Interactive Patient Education [address]          Joie Bimler, MD  03/31/2015 1:37 PM    [facility] [address] [place], [place], [place]  [address] Phone: [phone]; Fax: [phone]

## 2015-03-29 NOTE — Patient Instructions (Addendum)
Your physician has recommended you make the following change in your medication:   START FUROSEMIDE 40 MG ONE A DAY IF YOUR WEIGHT IS GREATER THAN 195 LBS.  TAKE THE POTASSIUM 10 MEQ DAILY ONLY WHEN YOU TAKE THE FUROSEMIDE   Your physician recommends that you schedule a follow-up appointment in: NEXT AVAILABLE WITH DR. Tresa Endo  Dr. Royann Shivers recommends that you schedule a follow-up appointment in: 3 MONTHS WITH PACEMAKER CHECK (MEDTRONIC-BLUE).     Your physician recommends that you weigh, daily, at the same time every day, and in the same amount of clothing. Please record your daily weights on the handout provided and bring it to your next appointment.  Daily Weight Record It is important to weigh yourself daily. Keep this daily weight chart near your scale. Weigh yourself each morning at the same time. Weigh yourself without shoes, and wear the same amount of clothing each day. Compare today's weight to yesterday's weight. Bring this form with you to your follow-up appointments. Call your health care provider if you have concerns about your weight, including rapid weight gain or rapid weight loss. Date: ________ Weight: ____________________ Date: ________ Weight: ____________________ Date: ________ Weight: ____________________ Date: ________ Weight: ____________________ Date: ________ Weight: ____________________ Date: ________ Weight: ____________________ Date: ________ Weight: ____________________ Date: ________ Weight: ____________________ Date: ________ Weight: ____________________ Date: ________ Weight: ____________________ Date: ________ Weight: ____________________ Date: ________ Weight: ____________________ Date: ________ Weight: ____________________ Date: ________ Weight: ____________________ Date: ________ Weight: ____________________ Date: ________ Weight: ____________________ Date: ________ Weight: ____________________ Date: ________ Weight: ____________________ Date: ________  Weight: ____________________ Date: ________ Weight: ____________________ Date: ________ Weight: ____________________ Date: ________ Weight: ____________________ Date: ________ Weight: ____________________ Date: ________ Weight: ____________________ Date: ________ Weight: ____________________ Date: ________ Weight: ____________________ Date: ________ Weight: ____________________ Date: ________ Weight: ____________________ Date: ________ Weight: ____________________ Date: ________ Weight: ____________________ Date: ________ Weight: ____________________ Date: ________ Weight: ____________________ Date: ________ Weight: ____________________ Date: ________ Weight: ____________________ Date: ________ Weight: ____________________ Date: ________ Weight: ____________________ Date: ________ Weight: ____________________ Date: ________ Weight: ____________________ Date: ________ Weight: ____________________ Date: ________ Weight: ____________________ Date: ________ Weight: ____________________ Date: ________ Weight: ____________________ Date: ________ Weight: ____________________ Date: ________ Weight: ____________________ Date: ________ Weight: ____________________ Date: ________ Weight: ____________________ Date: ________ Weight: ____________________ Date: ________ Weight: ____________________ Date: ________ Weight: ____________________ Date: ________ Weight: ____________________   This information is not intended to replace advice given to you by your health care provider. Make sure you discuss any questions you have with your health care provider.   Document Released: 03/19/2006 Document Revised: 01/26/2014 Document Reviewed: 08/04/2013 Elsevier Interactive Patient Education 2016 Else[employer] Inc.    Low-Sodium Eating Plan Sodium raises blood pressure and causes water to be held in the body. Getting less sodium from food will help lower your blood pressure, reduce any swelling, and protect your heart,  liver, and kidneys. We get sodium by adding salt (sodium chloride) to food. Most of our sodium comes from canned, boxed, and frozen foods. Restaurant foods, fast foods, and pizza are also very high in sodium. Even if you take medicine to lower your blood pressure or to reduce fluid in your body, getting less sodium from your food is important. WHAT IS MY PLAN? Most people should limit their sodium intake to 2,300 mg a day. Your health care provider recommends that you limit your sodium intake to ___2000_______ a day.  WHAT DO I NEED TO KNOW ABOUT THIS EATING PLAN? For the low-sodium eating plan, you will follow these general guidelines:  Choose foods with a % Daily  Value for sodium of less than 5% (as listed on the food label).   Use salt-free seasonings or herbs instead of table salt or sea salt.   Check with your health care provider or pharmacist before using salt substitutes.   Eat fresh foods.  Eat more vegetables and fruits.  Limit canned vegetables. If you do use them, rinse them well to decrease the sodium.   Limit cheese to 1 oz (28 g) per day.   Eat lower-sodium products, often labeled as "lower sodium" or "no salt added."  Avoid foods that contain monosodium glutamate (MSG). MSG is sometimes added to [place] food and some canned foods.  Check food labels (Nutrition Facts labels) on foods to learn how much sodium is in one serving.  Eat more home-cooked food and less restaurant, buffet, and fast food.  When eating at a restaurant, ask that your food be prepared with less salt, or no salt if possible.  HOW DO I READ FOOD LABELS FOR SODIUM INFORMATION? The Nutrition Facts label lists the amount of sodium in one serving of the food. If you eat more than one serving, you must multiply the listed amount of sodium by the number of servings. Food labels may also identify foods as:  Sodium free--Less than 5 mg in a serving.  Very low sodium--35 mg or less in a  serving.  Low sodium--140 mg or less in a serving.  Light in sodium--50% less sodium in a serving. For example, if a food that usually has 300 mg of sodium is changed to become light in sodium, it will have 150 mg of sodium.  Reduced sodium--25% less sodium in a serving. For example, if a food that usually has 400 mg of sodium is changed to reduced sodium, it will have 300 mg of sodium. WHAT FOODS CAN I EAT? Grains Low-sodium cereals, including oats, puffed wheat and rice, and shredded wheat cereals. Low-sodium crackers. Unsalted rice and pasta. Lower-sodium bread.  Vegetables Frozen or fresh vegetables. Low-sodium or reduced-sodium canned vegetables. Low-sodium or reduced-sodium tomato sauce and paste. Low-sodium or reduced-sodium tomato and vegetable juices.  Fruits Fresh, frozen, and canned fruit. Fruit juice.  Meat and Other Protein Products Low-sodium canned tuna and salmon. Fresh or frozen meat, poultry, seafood, and fish. Lamb. Unsalted nuts. Dried beans, peas, and lentils without added salt. Unsalted canned beans. Homemade soups without salt. Eggs.  Dairy Milk. Soy milk. Ricotta cheese. Low-sodium or reduced-sodium cheeses. Yogurt.  Condiments Fresh and dried herbs and spices. Salt-free seasonings. Onion and garlic powders. Low-sodium varieties of mustard and ketchup. Fresh or refrigerated horseradish. Lemon juice.  Fats and Oils Reduced-sodium salad dressings. Unsalted butter.  Other Unsalted popcorn and pretzels.  The items listed above may not be a complete list of recommended foods or beverages. Contact your dietitian for more options. WHAT FOODS ARE NOT RECOMMENDED? Grains Instant hot cereals. Bread stuffing, pancake, and biscuit mixes. Croutons. Seasoned rice or pasta mixes. Noodle soup cups. Boxed or frozen macaroni and cheese. Self-rising flour. Regular salted crackers. Vegetables Regular canned vegetables. Regular canned tomato sauce and paste. Regular tomato  and vegetable juices. Frozen vegetables in sauces. Salted [place] fries. Olives. Rosita Fire. Relishes. Sauerkraut. Salsa. Meat and Other Protein Products Salted, canned, smoked, spiced, or pickled meats, seafood, or fish. Bacon, ham, sausage, hot dogs, corned beef, chipped beef, and packaged luncheon meats. Salt pork. Jerky. Pickled herring. Anchovies, regular canned tuna, and sardines. Salted nuts. Dairy Processed cheese and cheese spreads. Cheese curds. Blue cheese and cottage cheese.  Buttermilk.  Condiments Onion and garlic salt, seasoned salt, table salt, and sea salt. Canned and packaged gravies. Worcestershire sauce. Tartar sauce. Barbecue sauce. Teriyaki sauce. Soy sauce, including reduced sodium. Steak sauce. Fish sauce. Oyster sauce. Cocktail sauce. Horseradish that you find on the shelf. Regular ketchup and mustard. Meat flavorings and tenderizers. Bouillon cubes. Hot sauce. Tabasco sauce. Marinades. Taco seasonings. Relishes. Fats and Oils Regular salad dressings. Salted butter. Margarine. Ghee. Bacon fat.  Other Potato and tortilla chips. Corn chips and puffs. Salted popcorn and pretzels. Canned or dried soups. Pizza. Frozen entrees and pot pies.  The items listed above may not be a complete list of foods and beverages to avoid. Contact your dietitian for more information.   This information is not intended to replace advice given to you by your health care provider. Make sure you discuss any questions you have with your health care provider.   Document Released: 06/27/2001 Document Revised: 01/26/2014 Document Reviewed: 11/09/2012 Elsevier Interactive Patient Education [employer]

## 2015-04-04 ENCOUNTER — Ambulatory Visit (INDEPENDENT_AMBULATORY_CARE_PROVIDER_SITE_OTHER): Payer: Medicare Other | Admitting: Cardiovascular Disease

## 2015-04-04 ENCOUNTER — Encounter: Payer: Self-pay | Admitting: Cardiovascular Disease

## 2015-04-04 VITALS — BP 110/58 | HR 80 | Ht 68.0 in | Wt 198.0 lb

## 2015-04-04 DIAGNOSIS — Z794 Long term (current) use of insulin: Secondary | ICD-10-CM

## 2015-04-04 DIAGNOSIS — R0681 Apnea, not elsewhere classified: Secondary | ICD-10-CM | POA: Diagnosis not present

## 2015-04-04 DIAGNOSIS — E119 Type 2 diabetes mellitus without complications: Secondary | ICD-10-CM | POA: Diagnosis not present

## 2015-04-04 DIAGNOSIS — I10 Essential (primary) hypertension: Secondary | ICD-10-CM | POA: Diagnosis not present

## 2015-04-04 DIAGNOSIS — I428 Other cardiomyopathies: Secondary | ICD-10-CM

## 2015-04-04 DIAGNOSIS — E785 Hyperlipidemia, unspecified: Secondary | ICD-10-CM

## 2015-04-04 DIAGNOSIS — N182 Chronic kidney disease, stage 2 (mild): Secondary | ICD-10-CM

## 2015-04-04 DIAGNOSIS — Z95810 Presence of automatic (implantable) cardiac defibrillator: Secondary | ICD-10-CM

## 2015-04-04 DIAGNOSIS — E1122 Type 2 diabetes mellitus with diabetic chronic kidney disease: Secondary | ICD-10-CM

## 2015-04-04 DIAGNOSIS — I429 Cardiomyopathy, unspecified: Secondary | ICD-10-CM

## 2015-04-04 MED ORDER — ASPIRIN EC 81 MG PO TBEC: 81.0000 mg | DELAYED_RELEASE_TABLET | Freq: Every day | ORAL | Status: DC

## 2015-04-04 NOTE — Patient Instructions (Signed)
Your physician recommends that you return for lab work fasting.   Your physician wants you to follow-up in: 6 months or sooner if needed. You will receive a reminder letter in the mail two months in advance. If you don't receive a letter, please call our office to schedule the follow-up appointment.  If you need a refill on your cardiac medications before your next appointment, please call your pharmacy.   

## 2015-04-06 ENCOUNTER — Encounter: Payer: Self-pay | Admitting: Cardiovascular Disease

## 2015-04-06 NOTE — Progress Notes (Signed)
Patient ID: Diana Campbell, female   DOB: 11-01-1945, 70 y.o.   MRN: [id number]     HPI: Diana Campbell is a 70 y.o. female who presents to the office for a cardiology evaluation.  I have not seen her since December 2014.  [name] suffered a ventricular fibrillation cardiac arrest in June 2005 at which time she was successfully resuscitated. She was found to have a nonischemic cardiomyopathy;  initial ejection fraction 15%. Catheterization revealed 30-40% intermediate stenosis. At that time, she underwent single-chamber Medtronic ICD implantation and initially had a spring fidelis lead. In July 2012 she underwent complete revision of her system.  An echo Doppler study in July 2013 showed an ejection fraction now at 45-50% with mild global hypokinesis. She did have mild MR, mild TR, and mild aortic sclerosis.  Additional problems include type 2 diabetes mellitus on insulin, hypertension, hyperlipidemia, and probable untreated obstructive sleep apnea.  In the past she has had some mild edema.  She has been followed by Dr. Sallyanne Kuster for her defibrillator.  He had seen her one week ago.  Interrogation of her device revealed normal lead parameters without recurrent episodes of VT or episodes of atrial mode switch.  She was atrially pacing 80% of the time and there was no ventricular pacing.  She did have a drop and thoracic impedance with optimal exceeding the threshold since January 2017.  She admits to being not very compliant with reference to sodium restriction.  She is enrolled in [facility] every 3 months for ICD interrogation.  She denies chest pain.  She is unaware of any presyncope or syncope.  She admits to fatigue.  When I last saw her, I recommended a sleep evaluation for obstructive sleep apnea.  It appears she never had this done.   Past Medical History  Diagnosis Date  . Cardiac abnormality     with defibrilator  . Diabetes mellitus   . Hypertension   . Hypercholesteremia   .  H/O cardiac arrest 2006  . Automatic implantable cardioverter-defibrillator in situ 07/2010    Past Surgical History  Procedure Laterality Date  . Cardiac defibrillator placement  07/21/2010    Medtronic Protecta XT DR, model#[id number], serial#[id number]  . Colonoscopy  03/30/2011    Procedure: COLONOSCOPY;  Surgeon: Jamesetta So, MD;  Location: [facility];  Service: Gastroenterology;  Laterality: N/A;  . 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  . Cardiac catheterization  04/14/2000    Normal coronary arteries.  . Cardiovascular stress test  04/05/2000    Scintigraphic evidence of mild LV dilatation and global LV dysfunction with an EF 41% with mild inferolateral ischemia.  . Transthoracic echocardiogram  08/03/2011    EF 45-50%, mildly reduced LV systolic function.  . Dorsal compartment release Right 08/11/2013    Procedure: RELEASE DORSAL COMPARTMENT (DEQUERVAIN);  Surgeon: Carole Civil, MD;  Location: [facility];  Service: Orthopedics;  Laterality: Right;    No Known Allergies  Current Outpatient Prescriptions  Medication Sig Dispense Refill  . ACCU-CHEK AVIVA PLUS test strip TEST BLOOD SUGAR 4 TIMES A DAY 150 each 5  . atorvastatin (LIPITOR) 20 MG tablet TAKE 1 TABLET AT BEDTIME 30 tablet 9  . calcium-vitamin D (OSCAL WITH D) 500-200 MG-UNIT per tablet Take 1 tablet by mouth daily.    . captopril (CAPOTEN) 25 MG tablet Take 25 mg by mouth daily.    Marland Kitchen  carvedilol (COREG) 12.5 MG tablet TAKE 1/2 TABLET BY MOUTH TWICE A DAY WITH A MEAL 30 tablet 11  . Cholecalciferol (VITAMIN D PO) Take 1 tablet by mouth daily.    . furosemide (LASIX) 40 MG tablet Take 1 tablet (40 mg total) by mouth daily. IF YOUR WEIGHT IS GREATER THAN 195 LBS 30 tablet 1  . Insulin Lispro Prot & Lispro (HUMALOG MIX 75/25 KWIKPEN) (75-25) 100 UNIT/ML Kwikpen  Inject 15 Units into the skin 2 (two) times daily. 15 mL 2  . Multiple Vitamin (MULITIVITAMIN WITH MINERALS) TABS Take 1 tablet by mouth daily.    . potassium chloride (K-DUR) 10 MEQ tablet Take 1 tablet (10 mEq total) by mouth daily. TAKE ONLY WHEN YOU TAKE THE FUROSEMIDE 30 tablet 1  . aspirin EC 81 MG tablet Take 1 tablet (81 mg total) by mouth daily. 90 tablet 3   No current facility-administered medications for this visit.    Social History   Social History  . Marital Status: Married    Spouse Name: N/A  . Number of Children: N/A  . Years of Education: N/A   Occupational History  . Not on file.   Social History Main Topics  . Smoking status: Never Smoker   . Smokeless tobacco: Current User    Types: Snuff     Comment: 3 dips per day  . Alcohol Use: No  . Drug Use: No  . Sexual Activity: Not on file   Other Topics Concern  . Not on file   Social History Narrative   Socially she is an S4 children. She has a new grand child.   No family history on file.   ROS General: Negative; No fevers, chills, or night sweats; positive for fatigue HEENT: Negative; No changes in vision or hearing, sinus congestion, difficulty swallowing Pulmonary: Negative; No cough, wheezing, shortness of breath, hemoptysis Cardiovascular: Negative; No chest pain, presyncope, syncope, palpitations GI: Negative; No nausea, vomiting, diarrhea, or abdominal pain GU: Negative; No dysuria, hematuria, or difficulty voiding Musculoskeletal: Negative; no myalgias, joint pain, or weakness Hematologic/Oncology: Negative; no easy bruising, bleeding Endocrine: Positive for diabetes mellitus Neuro: Negative; no changes in balance, headaches Skin: Negative; No rashes or skin lesions Psychiatric: Negative; No behavioral problems, depression Sleep: Negative; probable untreated sleep apnea with history of snoring, fatigue, frequent awakenings.  She did not undergo her recommended sleep study. Other  comprehensive 14 point system review is negative.   PE BP 110/58 mmHg  Pulse 80  Ht 5' 8"  (1.727 m)  Wt 198 lb (89.812 kg)  BMI 30.11 kg/m2   Wt Readings from Last 3 Encounters:  04/04/15 198 lb (89.812 kg)  03/29/15 201 lb 14.4 oz (91.581 kg)  03/25/15 204 lb (92.534 kg)   General: Alert, oriented, no distress.  Skin: normal turgor, no rashes HEENT: Normocephalic, atraumatic. Pupils round and reactive; sclera anicteric;no lid lag.  Nose without nasal septal hypertrophy Mouth/Parynx benign; Mallinpatti scale 3/4 Neck: No JVD, no carotid briuts Lungs: clear to ausculatation and percussion; no wheezing or rales Chest wall: Nontender to palpation Heart: RRR, s1 s2 normal 1/6 systolic murmur; no diastolic murmur.  No rubs thrills or heaves. Abdomen: Moderate central adiposity; soft, nontender; no hepatosplenomehaly, BS+; abdominal aorta nontender and not dilated by palpation. Back: No CVA tenderness Pulses 2+ Extremities: no clubbing cyanosis or edema, Homan's sign negative  Neurologic: grossly nonfocal Psychologic: normal affect and mood.  ECG (independently read by me): Atrially paced rhythm at 61 bpm.  December 2014 ECG:  Atrial lead paced rhythm at 79 beats per minute. PRWP.  Isolated PVC.  LABS:  BMP Latest Ref Rng 03/18/2015 08/07/2013 06/12/2011  Glucose 65 - 99 mg/dL 219(H) 236(H) 77  BUN 7 - 25 mg/dL 11 14 30(H)  Creatinine 0.60 - 0.93 mg/dL 0.85 1.02 1.22(H)  Sodium 135 - 146 mmol/L 143 142 140  Potassium 3.5 - 5.3 mmol/L 4.3 4.5 4.3  Chloride 98 - 110 mmol/L 106 105 105  CO2 20 - 31 mmol/L 27 27 24   Calcium 8.6 - 10.4 mg/dL 9.1 9.6 10.7(H)   Hepatic Function Latest Ref Rng 06/12/2011 03/23/2011  Total Protein 6.0 - 8.3 g/dL 7.9 8.3  Albumin 3.5 - 5.2 g/dL 3.9 4.1  AST 0 - 37 U/L 15 17  ALT 0 - 35 U/L 11 11  Alk Phosphatase 39 - 117 U/L 80 87  Total Bilirubin 0.3 - 1.2 mg/dL 0.2(L) 0.3   CBC Latest Ref Rng 08/07/2013 06/12/2011 03/23/2011  WBC 4.0 - 10.5 K/uL 8.8  6.0 -  Hemoglobin 12.0 - 15.0 g/dL 11.2(L) 14.0 12.9  Hematocrit 36.0 - 46.0 % 35.1(L) 39.8 38.0  Platelets 150 - 400 K/uL 252 233 -   Lab Results  Component Value Date   MCV 78.9 08/07/2013   MCV 89.8 06/12/2011   MCV 77.4* 03/23/2011   Lab Results  Component Value Date   TSH 1.177 07/21/2010   Lab Results  Component Value Date   HGBA1C 8.9* 03/18/2015   Lipid Panel  No results found for: CHOL, TRIG, HDL, CHOLHDL, VLDL, LDLCALC, LDLDIRECT   RADIOLOGY: No results found.    ASSESSMENT AND PLAN: [name] is a 19 -year-old African-American female who is 12 years status post a ventricular fibrillation cardiac arrest at which time she was found to have an ejection fraction of 15% and felt to have a nonischemic cardiomyopathy. She has a Nutritional therapist XT DR defibrillator in place. Her last echo Doppler study was done in July 2013 showed an ejection fraction of 45-50%. Presently, her blood pressure is well controlled on a medical regimen consisting of carvedilol 6.25 mg twice a day, furosemide 40 mg daily, and she is taking captopril for ACE in addition.  She has not had recent laboratory.  She is on atorvastatin 20 mg for hyperlipidemia.  She has had issues with dietary compliance both with reference to her diabetes, as well as sodium intake.  I have recommended she reduce her aspirin from 325 mg to 81 mg daily.  Fasting laboratory will be obtained.  When I had seen her in 2014  I was concerned about potential obstructive sleep apnea.  She opted against having a sleep study done.  Her diabetes is now being followed by Dr. Dorris Fetch.  She had some laboratory with him and  him, and also Dr. Hilma Favors.  We discussed sodium restriction.  In the past, she also was recommended to have a follow-up echo Doppler data , but she never followed up with this as well.  I will contact her regarding her laboratory and if adjustments to her medical regimen are needed.  She is undergoing every 3 month  defibrillator checks with [facility].  I will see her in 6 months for cardiology reevaluation.  Time spent: 25 minutes   Troy Sine, MD, [facility]  04/06/2015 10:53 AM

## 2015-04-08 ENCOUNTER — Encounter: Payer: Self-pay | Admitting: "Endocrinology

## 2015-04-08 ENCOUNTER — Ambulatory Visit (INDEPENDENT_AMBULATORY_CARE_PROVIDER_SITE_OTHER): Payer: Medicare Other | Admitting: "Endocrinology

## 2015-04-08 VITALS — BP 108/57 | HR 74 | Ht 68.0 in | Wt 198.0 lb

## 2015-04-08 DIAGNOSIS — Z794 Long term (current) use of insulin: Secondary | ICD-10-CM

## 2015-04-08 DIAGNOSIS — E785 Hyperlipidemia, unspecified: Secondary | ICD-10-CM

## 2015-04-08 DIAGNOSIS — N182 Chronic kidney disease, stage 2 (mild): Secondary | ICD-10-CM | POA: Diagnosis not present

## 2015-04-08 DIAGNOSIS — I10 Essential (primary) hypertension: Secondary | ICD-10-CM | POA: Diagnosis not present

## 2015-04-08 DIAGNOSIS — E1122 Type 2 diabetes mellitus with diabetic chronic kidney disease: Secondary | ICD-10-CM

## 2015-04-08 NOTE — Patient Instructions (Signed)

## 2015-04-08 NOTE — Progress Notes (Signed)
Subjective:    Patient ID: Diana Campbell, female    DOB: 08-18-1945, PCP Colette Ribas, MD   Past Medical History  Diagnosis Date  . Cardiac abnormality     with defibrilator  . Diabetes mellitus   . Hypertension   . Hypercholesteremia   . H/O cardiac arrest 2006  . Automatic implantable cardioverter-defibrillator in situ 07/2010   Past Surgical History  Procedure Laterality Date  . Cardiac defibrillator placement  07/21/2010    Medtronic Protecta XT DR, model#[id number], serial#[id number]  . Colonoscopy  03/30/2011    Procedure: COLONOSCOPY;  Surgeon: Dalia Heading, MD;  Location: [facility];  Service: Gastroenterology;  Laterality: N/A;  . 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  . Cardiac catheterization  04/14/2000    Normal coronary arteries.  . Cardiovascular stress test  04/05/2000    Scintigraphic evidence of mild LV dilatation and global LV dysfunction with an EF 41% with mild inferolateral ischemia.  . Transthoracic echocardiogram  08/03/2011    EF 45-50%, mildly reduced LV systolic function.  . Dorsal compartment release Right 08/11/2013    Procedure: RELEASE DORSAL COMPARTMENT (DEQUERVAIN);  Surgeon: Vickki Hearing, MD;  Location: [facility];  Service: Orthopedics;  Laterality: Right;   Social History   Social History  . Marital Status: Married    Spouse Name: N/A  . Number of Children: N/A  . Years of Education: N/A   Social History Main Topics  . Smoking status: Never Smoker   . Smokeless tobacco: Current User    Types: Snuff     Comment: 3 dips per day  . Alcohol Use: No  . Drug Use: No  . Sexual Activity: Not Asked   Other Topics Concern  . None   Social History Narrative   Outpatient Encounter Prescriptions as of 04/08/2015  Medication Sig  . aspirin EC 81 MG tablet Take 1 tablet (81  mg total) by mouth daily.  Marland Kitchen atorvastatin (LIPITOR) 20 MG tablet TAKE 1 TABLET AT BEDTIME  . calcium-vitamin D (OSCAL WITH D) 500-200 MG-UNIT per tablet Take 1 tablet by mouth daily.  . captopril (CAPOTEN) 25 MG tablet Take 25 mg by mouth daily.  . carvedilol (COREG) 12.5 MG tablet TAKE 1/2 TABLET BY MOUTH TWICE A DAY WITH A MEAL  . Cholecalciferol (VITAMIN D PO) Take 1 tablet by mouth daily.  . furosemide (LASIX) 40 MG tablet Take 1 tablet (40 mg total) by mouth daily. IF YOUR WEIGHT IS GREATER THAN 195 LBS  . Insulin Lispro Prot & Lispro (HUMALOG MIX 75/25 KWIKPEN [place]) Inject 22 Units into the skin 2 (two) times daily before a meal.  . Multiple Vitamin (MULITIVITAMIN WITH MINERALS) TABS Take 1 tablet by mouth daily.  . potassium chloride (K-DUR) 10 MEQ tablet Take 1 tablet (10 mEq total) by mouth daily. TAKE ONLY WHEN YOU TAKE THE FUROSEMIDE  . [DISCONTINUED] Insulin Lispro Prot & Lispro (HUMALOG MIX 75/25 KWIKPEN) (75-25) 100 UNIT/ML Kwikpen Inject 15 Units into the skin 2 (two) times daily.  Marland Kitchen ACCU-CHEK AVIVA PLUS test strip TEST BLOOD SUGAR 4 TIMES A DAY   No facility-administered encounter medications on file as of 04/08/2015.   ALLERGIES: No Known Allergies VACCINATION STATUS:  There is no immunization history on file for this patient.  Diabetes She presents for her follow-up diabetic visit. She has type 2 diabetes mellitus.  Onset time: She was diagnosed at approximate age of 65 years. Her disease course has been worsening. There are no hypoglycemic associated symptoms. Pertinent negatives for hypoglycemia include no confusion, headaches, pallor or seizures. There are no diabetic associated symptoms. Pertinent negatives for diabetes include no chest pain, no polydipsia, no polyphagia and no polyuria. There are no hypoglycemic complications. Symptoms are worsening. Diabetic complications include nephropathy. Risk factors for coronary artery disease include diabetes mellitus, dyslipidemia,  hypertension, sedentary lifestyle and tobacco exposure. Current diabetic treatment includes intensive insulin program (For unclear reasons, she stayed off of her Lantus.). She is compliant with treatment some of the time. Her weight is stable. She is following a generally unhealthy diet. She has not had a previous visit with a dietitian (She declined a referral to CDE.). Her breakfast blood glucose range is generally >200 mg/dl. Her lunch blood glucose range is generally >200 mg/dl. Her dinner blood glucose range is generally >200 mg/dl. Her highest blood glucose is 180-200 mg/dl. Her overall blood glucose range is >200 mg/dl. An ACE inhibitor/angiotensin II receptor blocker is being taken. Eye exam is current.  Hyperlipidemia This is a chronic problem. The current episode started more than 1 year ago. Pertinent negatives include no chest pain, myalgias or shortness of breath. Current antihyperlipidemic treatment includes statins. Risk factors for coronary artery disease include dyslipidemia, diabetes mellitus and a sedentary lifestyle.  Hypertension This is a chronic problem. The current episode started more than 1 year ago. The problem is controlled. Pertinent negatives include no chest pain, headaches, palpitations or shortness of breath. Past treatments include ACE inhibitors. Hypertensive end-organ damage includes kidney disease.     Review of Systems  Constitutional: Negative for unexpected weight change.  HENT: Negative for trouble swallowing and voice change.   Eyes: Negative for visual disturbance.  Respiratory: Negative for cough, shortness of breath and wheezing.   Cardiovascular: Negative for chest pain, palpitations and leg swelling.  Gastrointestinal: Negative for nausea, vomiting and diarrhea.  Endocrine: Negative for cold intolerance, heat intolerance, polydipsia, polyphagia and polyuria.  Musculoskeletal: Negative for myalgias and arthralgias.  Skin: Negative for color change,  pallor, rash and wound.  Neurological: Negative for seizures and headaches.  Psychiatric/Behavioral: Negative for suicidal ideas and confusion.    Objective:    BP 108/57 mmHg  Pulse 74  Ht 5\' 8"  (1.727 m)  Wt 198 lb (89.812 kg)  BMI 30.11 kg/m2  SpO2 98%  Wt Readings from Last 3 Encounters:  04/08/15 198 lb (89.812 kg)  04/04/15 198 lb (89.812 kg)  03/29/15 201 lb 14.4 oz (91.581 kg)    Physical Exam  Constitutional: She is oriented to person, place, and time. She appears well-developed.  HENT:  Head: Normocephalic and atraumatic.  Eyes: EOM are normal.  Neck: Normal range of motion. Neck supple. No tracheal deviation present. No thyromegaly present.  Cardiovascular: Normal rate and regular rhythm.   Pulmonary/Chest: Effort normal and breath sounds normal.  Abdominal: Soft. Bowel sounds are normal. There is no tenderness. There is no guarding.  Musculoskeletal: Normal range of motion. She exhibits no edema.  Neurological: She is alert and oriented to person, place, and time. She has normal reflexes. No cranial nerve deficit. Coordination normal.  Skin: Skin is warm and dry. No rash noted. No erythema. No pallor.  Psychiatric: She has a normal mood and affect. Judgment normal.    Results for orders placed or performed in visit on 03/18/15  Basic metabolic panel  Result Value Ref Range  Sodium 143 135 - 146 mmol/L   Potassium 4.3 3.5 - 5.3 mmol/L   Chloride 106 98 - 110 mmol/L   CO2 27 20 - 31 mmol/L   Glucose, Bld 219 (H) 65 - 99 mg/dL   BUN 11 7 - 25 mg/dL   Creat 1.61 0.96 - 0.45 mg/dL   Calcium 9.1 8.6 - 40.9 mg/dL  Hemoglobin W1X  Result Value Ref Range   Hgb A1c MFr Bld 8.9 (H) <5.7 %   Mean Plasma Glucose 209 (H) <117 mg/dL  Diabetic Labs (most recent): Lab Results  Component Value Date   HGBA1C 8.9* 03/18/2015   HGBA1C 7.7* 12/17/2014   HGBA1C 8.2* 07/21/2010     Assessment & Plan:   1. Type 2 diabetes mellitus with stage 2 chronic kidney disease,  with long-term current use of insulin (HCC)  - her diabetes is  complicated by stable CK D and patient remains at a high risk for more acute and chronic complications of diabetes which include CAD, CVA, CKD, retinopathy, and neuropathy. These are all discussed in detail with the patient.  Patient came with improved and consistent glucose profile, and  recent A1c  8.9% worsening from  7.7 %.  Glucose logs and insulin administration records pertaining to this visit,  to be scanned into patient's records.  Recent labs reviewed.   - I have re-counseled the patient on diet management and weight loss  by adopting a carbohydrate restricted / protein rich  Diet.  - Suggestion is made for patient to avoid simple carbohydrates   from their diet including Cakes , Desserts, Ice Cream,  Soda (  diet and regular) , Sweet Tea , Candies,  Chips, Cookies, Artificial Sweeteners,   and "Sugar-free" Products .  This will help patient to have stable blood glucose profile and potentially avoid unintended  Weight gain.  - Patient is advised to stick to a routine mealtimes to eat 3 meals  a day and avoid unnecessary snacks ( to snack only to correct hypoglycemia).  - The patient  declined referral to a  CDE for individualized DM education.  - I have approached patient with the following individualized plan to manage diabetes and patient agrees.  - She does not want to resume Lantus. I discussed her other options. I will continue Humalog 75/25 22 units with breakfast and 22 units with supper associated with associated with monitoring of BG AC and HS.  -I advised her to discontinue Humalog, and Lantus.  -Adjustment parameters for hypo and hyperglycemia were given in a written document to patient. -Patient is encouraged to call clinic for blood glucose levels less than 70 or above 200 mg /dl. -She could not afford Invokana.  - Patient specific target  for A1c; LDL, HDL, Triglycerides, and  Waist Circumference were  discussed in detail.  2) BP/HTN: Controlled. Continue current medications including ACEI/ARB. 3) Lipids/HPL:  continue statins. 4)  Weight/Diet: CDE consult in progress, exercise, and carbohydrates information provided.  5) Chronic Care/Health Maintenance:  -Patient is on ACEI/ARB and Statin medications and encouraged to continue to follow up with Ophthalmology, Podiatrist at least yearly or according to recommendations, and advised to  stay away from smoking. I have recommended yearly flu vaccine and pneumonia vaccination at least every 5 years; moderate intensity exercise for up to 150 minutes weekly; and  sleep for at least 7 hours a day.  - 25 minutes of time was spent on the care of this patient , 50% of which was  applied for counseling on diabetes complications and their preventions.  - I advised patient to maintain close follow up with Colette Ribas, MD for primary care needs.  Patient is asked to bring meter and  blood glucose logs during their next visit.   Follow up plan: -Return in about 10 weeks (around 06/17/2015) for diabetes, high blood pressure, high cholesterol, follow up with pre-visit labs, meter, and logs.  Marquis Lunch, MD Phone: [phone]  Fax: [phone]   04/08/2015, 9:30 AM

## 2015-04-09 ENCOUNTER — Encounter: Payer: Medicare Other | Admitting: Cardiovascular Disease

## 2015-04-11 ENCOUNTER — Encounter: Payer: Medicare Other | Admitting: Cardiovascular Disease

## 2015-04-11 LAB — CUP PACEART INCLINIC DEVICE CHECK
Brady Statistic AS VP Percent: 0.01 %
Brady Statistic AS VS Percent: 19.51 %
Date Time Interrogation Session: 20170310151308
HIGH POWER IMPEDANCE MEASURED VALUE: 60 Ohm
HighPow Impedance: 418 Ohm
HighPow Impedance: 47 Ohm
Implantable Lead Implant Date: 20120702
Implantable Lead Location: 753859
Lead Channel Impedance Value: 475 Ohm
Lead Channel Pacing Threshold Amplitude: 0.625 V
Lead Channel Pacing Threshold Pulse Width: 0.4 ms
Lead Channel Sensing Intrinsic Amplitude: 3.375 mV
Lead Channel Setting Pacing Pulse Width: 0.4 ms
MDC IDC LEAD IMPLANT DT: 20120702
MDC IDC LEAD LOCATION: 753860
MDC IDC MSMT BATTERY VOLTAGE: 3 V
MDC IDC MSMT LEADCHNL RA PACING THRESHOLD AMPLITUDE: 0.875 V
MDC IDC MSMT LEADCHNL RA PACING THRESHOLD PULSEWIDTH: 0.4 ms
MDC IDC MSMT LEADCHNL RA SENSING INTR AMPL: 3.375 mV
MDC IDC MSMT LEADCHNL RV IMPEDANCE VALUE: 513 Ohm
MDC IDC MSMT LEADCHNL RV SENSING INTR AMPL: 9.875 mV
MDC IDC MSMT LEADCHNL RV SENSING INTR AMPL: 9.875 mV
MDC IDC SET LEADCHNL RA PACING AMPLITUDE: 2 V
MDC IDC SET LEADCHNL RV PACING AMPLITUDE: 2 V
MDC IDC SET LEADCHNL RV SENSING SENSITIVITY: 0.3 mV
MDC IDC STAT BRADY AP VP PERCENT: 0.03 %
MDC IDC STAT BRADY AP VS PERCENT: 80.45 %
MDC IDC STAT BRADY RA PERCENT PACED: 80.48 %
MDC IDC STAT BRADY RV PERCENT PACED: 0.04 %

## 2015-04-21 ENCOUNTER — Other Ambulatory Visit: Payer: Self-pay | Admitting: Cardiovascular Disease

## 2015-04-22 NOTE — Telephone Encounter (Signed)
Rx(s) sent to pharmacy electronically.  

## 2015-05-06 LAB — CBC
HCT: 34.7 % — ABNORMAL LOW (ref 35.0–45.0)
HEMOGLOBIN: 11 g/dL — AB (ref 11.7–15.5)
MCH: 25.3 pg — ABNORMAL LOW (ref 27.0–33.0)
MCHC: 31.7 g/dL — ABNORMAL LOW (ref 32.0–36.0)
MCV: 79.8 fL — AB (ref 80.0–100.0)
MPV: 8.9 fL (ref 7.5–12.5)
PLATELETS: 245 10*3/uL (ref 140–400)
RBC: 4.35 MIL/uL (ref 3.80–5.10)
RDW: 14.2 % (ref 11.0–15.0)
WBC: 8.5 10*3/uL (ref 3.8–10.8)

## 2015-05-07 LAB — LIPID PANEL
CHOLESTEROL: 129 mg/dL (ref 125–200)
HDL: 53 mg/dL (ref >=46)
LDL Cholesterol: 64 mg/dL (ref <130)
TRIGLYCERIDES: 59 mg/dL (ref <150)
Total CHOL/HDL Ratio: 2.4 Ratio (ref <=5.0)
VLDL: 12 mg/dL (ref <30)

## 2015-05-07 LAB — COMPREHENSIVE METABOLIC PANEL
ALT: 9 U/L (ref 6–29)
AST: 11 U/L (ref 10–35)
Albumin: 3.6 g/dL (ref 3.6–5.1)
Alkaline Phosphatase: 78 U/L (ref 33–130)
BUN: 13 mg/dL (ref 7–25)
CHLORIDE: 105 mmol/L (ref 98–110)
CO2: 30 mmol/L (ref 20–31)
Calcium: 8.9 mg/dL (ref 8.6–10.4)
Creat: 0.89 mg/dL (ref 0.60–0.93)
GLUCOSE: 232 mg/dL — AB (ref 65–99)
POTASSIUM: 4.1 mmol/L (ref 3.5–5.3)
SODIUM: 142 mmol/L (ref 135–146)
Total Bilirubin: 0.4 mg/dL (ref 0.2–1.2)
Total Protein: 6.1 g/dL (ref 6.1–8.1)

## 2015-05-07 LAB — TSH: TSH: 1.19 mIU/L

## 2015-05-20 ENCOUNTER — Encounter: Payer: Self-pay | Admitting: *Deleted

## 2015-06-11 ENCOUNTER — Other Ambulatory Visit: Payer: Self-pay | Admitting: "Endocrinology

## 2015-06-11 LAB — HEMOGLOBIN A1C
Hgb A1c MFr Bld: 10 % — ABNORMAL HIGH (ref <5.7)
Mean Plasma Glucose: 240 mg/dL

## 2015-06-11 LAB — BASIC METABOLIC PANEL
BUN: 12 mg/dL (ref 7–25)
CHLORIDE: 106 mmol/L (ref 98–110)
CO2: 27 mmol/L (ref 20–31)
Calcium: 9.4 mg/dL (ref 8.6–10.4)
Creat: 0.84 mg/dL (ref 0.60–0.93)
Glucose, Bld: 218 mg/dL — ABNORMAL HIGH (ref 65–99)
POTASSIUM: 4.3 mmol/L (ref 3.5–5.3)
SODIUM: 141 mmol/L (ref 135–146)

## 2015-06-20 ENCOUNTER — Ambulatory Visit: Payer: Medicare Other | Admitting: "Endocrinology

## 2015-06-20 ENCOUNTER — Ambulatory Visit (INDEPENDENT_AMBULATORY_CARE_PROVIDER_SITE_OTHER): Payer: Medicare Other | Admitting: "Endocrinology

## 2015-06-20 ENCOUNTER — Encounter: Payer: Self-pay | Admitting: "Endocrinology

## 2015-06-20 VITALS — BP 136/65 | HR 78 | Ht 68.0 in | Wt 194.0 lb

## 2015-06-20 DIAGNOSIS — E785 Hyperlipidemia, unspecified: Secondary | ICD-10-CM

## 2015-06-20 DIAGNOSIS — E1122 Type 2 diabetes mellitus with diabetic chronic kidney disease: Secondary | ICD-10-CM

## 2015-06-20 DIAGNOSIS — Z794 Long term (current) use of insulin: Secondary | ICD-10-CM

## 2015-06-20 DIAGNOSIS — I10 Essential (primary) hypertension: Secondary | ICD-10-CM | POA: Diagnosis not present

## 2015-06-20 DIAGNOSIS — N182 Chronic kidney disease, stage 2 (mild): Secondary | ICD-10-CM

## 2015-06-20 NOTE — Progress Notes (Signed)
Subjective:    Patient ID: Diana Campbell, female    DOB: 02/06/1945, PCP Colette Ribas, MD   Past Medical History  Diagnosis Date  . Cardiac abnormality     with defibrilator  . Diabetes mellitus   . Hypertension   . Hypercholesteremia   . H/O cardiac arrest 2006  . Automatic implantable cardioverter-defibrillator in situ 07/2010   Past Surgical History  Procedure Laterality Date  . Cardiac defibrillator placement  07/21/2010    Medtronic Protecta XT DR, model#[id number], serial#[id number]  . Colonoscopy  03/30/2011    Procedure: COLONOSCOPY;  Surgeon: Dalia Heading, MD;  Location: [facility];  Service: Gastroenterology;  Laterality: N/A;  . 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  . Cardiac catheterization  04/14/2000    Normal coronary arteries.  . Cardiovascular stress test  04/05/2000    Scintigraphic evidence of mild LV dilatation and global LV dysfunction with an EF 41% with mild inferolateral ischemia.  . Transthoracic echocardiogram  08/03/2011    EF 45-50%, mildly reduced LV systolic function.  . Dorsal compartment release Right 08/11/2013    Procedure: RELEASE DORSAL COMPARTMENT (DEQUERVAIN);  Surgeon: Vickki Hearing, MD;  Location: [facility];  Service: Orthopedics;  Laterality: Right;   Social History   Social History  . Marital Status: Married    Spouse Name: N/A  . Number of Children: N/A  . Years of Education: N/A   Social History Main Topics  . Smoking status: Never Smoker   . Smokeless tobacco: Current User    Types: Snuff     Comment: 3 dips per day  . Alcohol Use: No  . Drug Use: No  . Sexual Activity: Not Asked   Other Topics Concern  . None   Social History Narrative   Outpatient Encounter Prescriptions as of 06/20/2015  Medication Sig  . ACCU-CHEK AVIVA PLUS test strip TEST BLOOD  SUGAR 4 TIMES A DAY  . aspirin EC 81 MG tablet Take 1 tablet (81 mg total) by mouth daily.  Marland Kitchen atorvastatin (LIPITOR) 20 MG tablet Take 1 tablet (20 mg total) by mouth at bedtime.  . calcium-vitamin D (OSCAL WITH D) 500-200 MG-UNIT per tablet Take 1 tablet by mouth daily.  . captopril (CAPOTEN) 25 MG tablet Take 25 mg by mouth daily.  . carvedilol (COREG) 12.5 MG tablet Take 0.5 tablets (6.25 mg total) by mouth 2 (two) times daily with a meal.  . Cholecalciferol (VITAMIN D PO) Take 1 tablet by mouth daily.  . furosemide (LASIX) 40 MG tablet Take 1 tablet (40 mg total) by mouth daily. IF YOUR WEIGHT IS GREATER THAN 195 LBS  . Insulin Lispro Prot & Lispro (HUMALOG MIX 75/25 KWIKPEN [place]) Inject 26 Units into the skin 2 (two) times daily before a meal.  . Multiple Vitamin (MULITIVITAMIN WITH MINERALS) TABS Take 1 tablet by mouth daily.  . potassium chloride (K-DUR) 10 MEQ tablet Take 1 tablet (10 mEq total) by mouth daily. TAKE ONLY WHEN YOU TAKE THE FUROSEMIDE   No facility-administered encounter medications on file as of 06/20/2015.   ALLERGIES: No Known Allergies VACCINATION STATUS:  There is no immunization history on file for this patient.  Diabetes She presents for her follow-up diabetic visit. She has type 2 diabetes mellitus. Onset time: She was diagnosed at approximate age of 32 years. Her disease course has been worsening.  There are no hypoglycemic associated symptoms. Pertinent negatives for hypoglycemia include no confusion, headaches, pallor or seizures. There are no diabetic associated symptoms. Pertinent negatives for diabetes include no chest pain, no polydipsia, no polyphagia and no polyuria. There are no hypoglycemic complications. Symptoms are worsening. Diabetic complications include nephropathy. Risk factors for coronary artery disease include diabetes mellitus, dyslipidemia, hypertension, sedentary lifestyle and tobacco exposure. Current diabetic treatment includes intensive  insulin program (For unclear reasons, she stayed off of her Lantus.). She is compliant with treatment some of the time. Her weight is stable. She is following a generally unhealthy diet. She has not had a previous visit with a dietitian (She declined a referral to CDE.). Her breakfast blood glucose range is generally >200 mg/dl. Her lunch blood glucose range is generally >200 mg/dl. Her dinner blood glucose range is generally >200 mg/dl. Her overall blood glucose range is >200 mg/dl. An ACE inhibitor/angiotensin II receptor blocker is being taken. Eye exam is current.  Hyperlipidemia This is a chronic problem. The current episode started more than 1 year ago. Pertinent negatives include no chest pain, myalgias or shortness of breath. Current antihyperlipidemic treatment includes statins. Risk factors for coronary artery disease include dyslipidemia, diabetes mellitus and a sedentary lifestyle.  Hypertension This is a chronic problem. The current episode started more than 1 year ago. The problem is controlled. Pertinent negatives include no chest pain, headaches, palpitations or shortness of breath. Past treatments include ACE inhibitors. Hypertensive end-organ damage includes kidney disease.     Review of Systems  Constitutional: Negative for unexpected weight change.  HENT: Negative for trouble swallowing and voice change.   Eyes: Negative for visual disturbance.  Respiratory: Negative for cough, shortness of breath and wheezing.   Cardiovascular: Negative for chest pain, palpitations and leg swelling.  Gastrointestinal: Negative for nausea, vomiting and diarrhea.  Endocrine: Negative for cold intolerance, heat intolerance, polydipsia, polyphagia and polyuria.  Musculoskeletal: Negative for myalgias and arthralgias.  Skin: Negative for color change, pallor, rash and wound.  Neurological: Negative for seizures and headaches.  Psychiatric/Behavioral: Negative for suicidal ideas and confusion.     Objective:    BP 136/65 mmHg  Pulse 78  Ht  (1.727 m)  Wt 194 lb (87.998 kg)  BMI 29.50 kg/m2  SpO2 98%  Wt Readings from Last 3 Encounters:  06/20/15 194 lb (87.998 kg)  04/08/15 198 lb (89.812 kg)  04/04/15 198 lb (89.812 kg)    Physical Exam  Constitutional: She is oriented to person, place, and time. She appears well-developed.  HENT:  Head: Normocephalic and atraumatic.  Eyes: EOM are normal.  Neck: Normal range of motion. Neck supple. No tracheal deviation present. No thyromegaly present.  Cardiovascular: Normal rate and regular rhythm.   Pulmonary/Chest: Effort normal and breath sounds normal.  Abdominal: Soft. Bowel sounds are normal. There is no tenderness. There is no guarding.  Musculoskeletal: Normal range of motion. She exhibits no edema.  Neurological: She is alert and oriented to person, place, and time. She has normal reflexes. No cranial nerve deficit. Coordination normal.  Skin: Skin is warm and dry. No rash noted. No erythema. No pallor.  Psychiatric: She has a normal mood and affect. Judgment normal.    Results for orders placed or performed in visit on 06/11/15  Basic metabolic panel  Result Value Ref Range   Sodium 141 135 - 146 mmol/L   Potassium 4.3 3.5 - 5.3 mmol/L   Chloride 106 98 - 110 mmol/L   CO2 27  20 - 31 mmol/L   Glucose, Bld 218 (H) 65 - 99 mg/dL   BUN 12 7 - 25 mg/dL   Creat 4.97 5.30 - 0.51 mg/dL   Calcium 9.4 8.6 - 10.2 mg/dL  Hemoglobin T1Z  Result Value Ref Range   Hgb A1c MFr Bld 10.0 (H) <5.7 %   Mean Plasma Glucose 240 mg/dL  Diabetic Labs (most recent): Lab Results  Component Value Date   HGBA1C 10.0* 06/11/2015   HGBA1C 8.9* 03/18/2015   HGBA1C 7.7* 12/17/2014     Assessment & Plan:   1. Type 2 diabetes mellitus with stage 2 chronic kidney disease, with long-term current use of insulin (HCC)  - her diabetes is  complicated by stable CKD ( which is improving) and patient remains at a high risk for more acute  and chronic complications of diabetes which include CAD, CVA, CKD, retinopathy, and neuropathy. These are all discussed in detail with the patient.  Patient came with above target glucose profile, and  recent A1c 10% increasing from  8.9% . - Glucose logs and insulin administration records pertaining to this visit,  to be scanned into patient's records.  Recent labs reviewed.   - I have re-counseled the patient on diet management and weight loss  by adopting a carbohydrate restricted / protein rich  Diet.  - Suggestion is made for patient to avoid simple carbohydrates   from their diet including Cakes , Desserts, Ice Cream,  Soda (  diet and regular) , Sweet Tea , Candies,  Chips, Cookies, Artificial Sweeteners,   and "Sugar-free" Products .  This will help patient to have stable blood glucose profile and potentially avoid unintended  Weight gain.  - Patient is advised to stick to a routine mealtimes to eat 3 meals  a day and avoid unnecessary snacks ( to snack only to correct hypoglycemia).  - The patient  declined referral to a  CDE for individualized DM education.  - I have approached patient with the following individualized plan to manage diabetes and patient agrees.  - She does not want to resume Lantus/Levemir. - . I will increase Humalog 75/25  To 26 units with breakfast and 26 units with supper associated with associated with monitoring of BG AC and HS.   -Adjustment parameters for hypo and hyperglycemia were given in a written document to patient. -Patient is encouraged to call clinic for blood glucose levels less than 70 or above 200 mg /dl. -She could not afford Invokana.  - Patient specific target  for A1c; LDL, HDL, Triglycerides, and  Waist Circumference were discussed in detail.  2) BP/HTN: Controlled. Continue current medications including ACEI/ARB. 3) Lipids/HPL:  continue statins. 4)  Weight/Diet: CDE consult in progress, exercise, and carbohydrates information  provided.  5) Chronic Care/Health Maintenance:  -Patient is on ACEI/ARB and Statin medications and encouraged to continue to follow up with Ophthalmology, Podiatrist at least yearly or according to recommendations, and advised to  stay away from smoking. I have recommended yearly flu vaccine and pneumonia vaccination at least every 5 years; moderate intensity exercise for up to 150 minutes weekly; and  sleep for at least 7 hours a day.  - 25 minutes of time was spent on the care of this patient , 50% of which was applied for counseling on diabetes complications and their preventions.  - I advised patient to maintain close follow up with Colette Ribas, MD for primary care needs.  Patient is asked to bring meter and  blood glucose logs during their next visit.   Follow up plan: -Return in about 3 months (around 09/20/2015) for diabetes, high blood pressure, high cholesterol, follow up with pre-visit labs, meter, and logs.  Marquis Lunch, MD Phone: [phone]  Fax: [phone]   06/20/2015, 10:11 AM

## 2015-06-20 NOTE — Patient Instructions (Signed)

## 2015-06-25 ENCOUNTER — Other Ambulatory Visit: Payer: Self-pay | Admitting: "Endocrinology

## 2015-07-02 ENCOUNTER — Encounter: Payer: Medicare Other | Admitting: Cardiovascular Disease

## 2015-07-26 ENCOUNTER — Other Ambulatory Visit: Payer: Self-pay | Admitting: "Endocrinology

## 2015-08-14 ENCOUNTER — Other Ambulatory Visit: Payer: Self-pay | Admitting: Family Medicine

## 2015-08-14 DIAGNOSIS — Z1231 Encounter for screening mammogram for malignant neoplasm of breast: Secondary | ICD-10-CM

## 2015-08-26 ENCOUNTER — Ambulatory Visit
Admission: RE | Admit: 2015-08-26 | Discharge: 2015-08-26 | Disposition: A | Discharge status: Home / Self Care | Payer: Medicare Other | Source: Ambulatory Visit | Attending: Family Medicine | Admitting: Family Medicine

## 2015-08-26 DIAGNOSIS — Z1231 Encounter for screening mammogram for malignant neoplasm of breast: Secondary | ICD-10-CM

## 2015-09-03 ENCOUNTER — Ambulatory Visit (INDEPENDENT_AMBULATORY_CARE_PROVIDER_SITE_OTHER): Payer: Medicare Other | Admitting: Cardiovascular Disease

## 2015-09-03 ENCOUNTER — Encounter: Payer: Self-pay | Admitting: Cardiovascular Disease

## 2015-09-03 ENCOUNTER — Encounter (INDEPENDENT_AMBULATORY_CARE_PROVIDER_SITE_OTHER): Payer: Self-pay

## 2015-09-03 VITALS — BP 100/54 | HR 60 | Ht 68.0 in | Wt 195.0 lb

## 2015-09-03 DIAGNOSIS — E785 Hyperlipidemia, unspecified: Secondary | ICD-10-CM | POA: Diagnosis not present

## 2015-09-03 DIAGNOSIS — E1122 Type 2 diabetes mellitus with diabetic chronic kidney disease: Secondary | ICD-10-CM

## 2015-09-03 DIAGNOSIS — I480 Paroxysmal atrial fibrillation: Secondary | ICD-10-CM

## 2015-09-03 DIAGNOSIS — I10 Essential (primary) hypertension: Secondary | ICD-10-CM

## 2015-09-03 DIAGNOSIS — Z79899 Other long term (current) drug therapy: Secondary | ICD-10-CM | POA: Diagnosis not present

## 2015-09-03 DIAGNOSIS — I429 Cardiomyopathy, unspecified: Secondary | ICD-10-CM

## 2015-09-03 DIAGNOSIS — I5042 Chronic combined systolic (congestive) and diastolic (congestive) heart failure: Secondary | ICD-10-CM | POA: Diagnosis not present

## 2015-09-03 DIAGNOSIS — N182 Chronic kidney disease, stage 2 (mild): Secondary | ICD-10-CM

## 2015-09-03 DIAGNOSIS — Z95810 Presence of automatic (implantable) cardiac defibrillator: Secondary | ICD-10-CM

## 2015-09-03 DIAGNOSIS — Z794 Long term (current) use of insulin: Secondary | ICD-10-CM

## 2015-09-03 LAB — CUP PACEART INCLINIC DEVICE CHECK
Brady Statistic AS VS Percent: 15 %
Implantable Lead Implant Date: 20120702
Lead Channel Impedance Value: 456 Ohm
Lead Channel Impedance Value: 475 Ohm
Lead Channel Pacing Threshold Amplitude: 0.625 V
Lead Channel Pacing Threshold Amplitude: 0.875 V
Lead Channel Pacing Threshold Pulse Width: 0.4 ms
Lead Channel Pacing Threshold Pulse Width: 0.4 ms
Lead Channel Setting Pacing Amplitude: 2 V
Lead Channel Setting Sensing Sensitivity: 0.3 mV
MDC IDC LEAD IMPLANT DT: 20120702
MDC IDC LEAD LOCATION: 753859
MDC IDC LEAD LOCATION: 753860
MDC IDC MSMT BATTERY VOLTAGE: 2.95 V
MDC IDC MSMT LEADCHNL RA SENSING INTR AMPL: 2.8 mV
MDC IDC MSMT LEADCHNL RV SENSING INTR AMPL: 9.5 mV
MDC IDC SESS DTM: 20170815102415
MDC IDC SET LEADCHNL RV PACING AMPLITUDE: 2 V
MDC IDC SET LEADCHNL RV PACING PULSEWIDTH: 0.4 ms
MDC IDC STAT BRADY AP VP PERCENT: 0.1 % — AB
MDC IDC STAT BRADY AP VS PERCENT: 84.9 %
MDC IDC STAT BRADY AS VP PERCENT: 0.1 % — AB

## 2015-09-03 MED ORDER — LISINOPRIL 5 MG PO TABS: 5.0000 mg | ORAL_TABLET | Freq: Every day | ORAL | 11 refills | Status: DC

## 2015-09-03 NOTE — Progress Notes (Signed)
Patient ID: Diana Campbell, female   DOB: 03/12/1945, 70 y.o.   MRN: [id number]    Cardiology Office Note    Date:  09/04/2015   [name], DOB 06/08/1945, MRN [id number]  PCP:  Colette RibasGOLDING, JOHN CABOT, MD  Cardiologist:  Nicki Guadalajarahomas Kelly, M.D. Thurmon FairMihai Cameryn Schum, MD   Chief Complaint  Patient presents with  . Follow-up    pacer check, has pain in legs, no complaints      History of Present Illness:  Diana Campbell is a 70 y.o. female a remote history of ventricular fibrillation arrest in June 2005, resuscitated, in the setting of nonischemic cardiomyopathy with left ventricular ejection fraction that has improved from 15% at diagnosis to around 45% by most recent evaluation. She has minor coronary atherosclerosis by previous angiography. She had a  Medtronic defibrillator/Sprint Fidelis lead,subsequently had a complete revision of her system with placement of a new right ventricular lead and dual-chamber generator in July 2012.  Additional problems include obstructive sleep apnea, diabetes mellitus on insulin, hypertension and hyperlipidemia.  Today her major complaint is discomfort in the left shoulder. She really cannot lift her left arm above the horizontal level. The discomfort is clearly positional. She has to sometimes lift her left arm with her right arm. It sounds like a rotator cuff tear to me.  She denies exertional dyspnea and does not have chest pain. She has minimal ankle swelling. She denies defibrillator discharges, angina, palpitations, syncope, focal neurological events or intermittent claudication. It seems she has been more compliant with sodium restriction. She has only needed to boost her dose of furosemide twice in the last 6 months. Surprisingly, she is taking captopril as a once daily medication. Taking it more than once daily caused dizziness.  ICD interrogation shows normal function, normal lead parameters, no ventricular tachycardia. He did have a 45 second  episode of atrial mode switch which is convincingly atrial fibrillation. This has not been recorded in the past. Arial pacing occurs 85% of the time and there is no ventricular pacing. Generator voltage is 3.0 V, RRT 2.63 V. Activities fairly constant 2.4 hours per day with a favorable heart rate histogram distribution.  Her thoracic impedance has been fairly stable. No evidence of recent fluid overload.   Past Medical History:  Diagnosis Date  . Automatic implantable cardioverter-defibrillator in situ 07/2010  . Cardiac abnormality    with defibrilator  . Diabetes mellitus   . H/O cardiac arrest 2006  . Hypercholesteremia   . Hypertension     Past Surgical History:  Procedure Laterality Date  . 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  Marland Kitchen. CARDIAC CATHETERIZATION  04/14/2000   Normal coronary arteries.  Marland Kitchen. CARDIAC DEFIBRILLATOR PLACEMENT  07/21/2010   Medtronic Protecta XT DR, model#[id number], serial#[id number]  . CARDIOVASCULAR STRESS TEST  04/05/2000   Scintigraphic evidence of mild LV dilatation and global LV dysfunction with an EF 41% with mild inferolateral ischemia.  . COLONOSCOPY  03/30/2011   Procedure: COLONOSCOPY;  Surgeon: Dalia HeadingMark A Jenkins, MD;  Location: [facility];  Service: Gastroenterology;  Laterality: N/A;  . DORSAL COMPARTMENT RELEASE Right 08/11/2013   Procedure: RELEASE DORSAL COMPARTMENT (DEQUERVAIN);  Surgeon: Vickki HearingStanley E Harrison, MD;  Location: [facility];  Service: Orthopedics;  Laterality: Right;  . TRANSTHORACIC ECHOCARDIOGRAM  08/03/2011   EF 45-50%, mildly reduced LV systolic function.    Outpatient Medications  Prior to Visit  Medication Sig Dispense Refill  . ACCU-CHEK AVIVA PLUS test strip TEST BLOOD SUGAR 4 TIMES A DAY (DX:E11.65) 150 each 5  . aspirin EC 81 MG tablet Take 1 tablet (81 mg total) by mouth daily.  90 tablet 3  . calcium-vitamin D (OSCAL WITH D) 500-200 MG-UNIT per tablet Take 1 tablet by mouth daily.    . carvedilol (COREG) 12.5 MG tablet Take 0.5 tablets (6.25 mg total) by mouth 2 (two) times daily with a meal. 30 tablet 11  . Cholecalciferol (VITAMIN D PO) Take 1 tablet by mouth daily.    . Insulin Lispro Prot & Lispro (HUMALOG MIX 75/25 KWIKPEN) (75-25) 100 UNIT/ML Kwikpen Inject 26 Units into the skin 2 (two) times daily. 15 mL 2  . Multiple Vitamin (MULITIVITAMIN WITH MINERALS) TABS Take 1 tablet by mouth daily.    . captopril (CAPOTEN) 25 MG tablet Take 25 mg by mouth daily.    Marland Kitchen atorvastatin (LIPITOR) 20 MG tablet Take 1 tablet (20 mg total) by mouth at bedtime. (Patient not taking: Reported on 09/03/2015) 30 tablet 11  . furosemide (LASIX) 40 MG tablet Take 1 tablet (40 mg total) by mouth daily. IF YOUR WEIGHT IS GREATER THAN 195 LBS (Patient not taking: Reported on 09/03/2015) 30 tablet 1  . potassium chloride (K-DUR) 10 MEQ tablet Take 1 tablet (10 mEq total) by mouth daily. TAKE ONLY WHEN YOU TAKE THE FUROSEMIDE (Patient not taking: Reported on 09/03/2015) 30 tablet 1   No facility-administered medications prior to visit.      Allergies:   Review of patient's allergies indicates no known allergies.   Social History   Social History  . Marital status: Married    Spouse name: N/A  . Number of children: N/A  . Years of education: N/A   Social History Main Topics  . Smoking status: Never Smoker  . Smokeless tobacco: Current User    Types: Snuff     Comment: 3 dips per day  . Alcohol use No  . Drug use: No  . Sexual activity: Not Asked   Other Topics Concern  . None   Social History Narrative  . None     ROS:   Please see the history of present illness.    ROS All other systems reviewed and are negative.   PHYSICAL EXAM:   VS:  BP (!) 100/54 (BP Location: Left Arm, Patient Position: Sitting, Cuff Size: Normal)   Pulse 60   Ht 5\' 8"  (1.727 m)   Wt 195 lb  (88.5 kg)   BMI 29.65 kg/m    GEN: Well nourished, well developed, in no acute distress  HEENT: normal  Neck: 6-8 cm elevation of JVD, carotid bruits, or masses Cardiac: RRR; no murmurs, rubs, or gallops, trivial symmetrical bilateral ankle edema  Respiratory:  clear to auscultation bilaterally, normal work of breathing GI: soft, nontender, nondistended, + BS MS: no deformity or atrophy  Skin: warm and dry, no rash Neuro:  Alert and Oriented x 3, Strength and sensation are intact Psych: euthymic mood, full affect  Wt Readings from Last 3 Encounters:  09/03/15 195 lb (88.5 kg)  06/20/15 194 lb (88 kg)  04/08/15 198 lb (89.8 kg)      Studies/Labs Reviewed:   EKG:  EKG is not ordered today  Recent Labs: 05/06/2015: ALT 9; Hemoglobin 11.0; Platelets 245; TSH 1.19 06/11/2015: BUN 12; Creat 0.84; Potassium 4.3; Sodium 141   Lipid Panel    Component Value Date/Time  CHOL 129 05/06/2015 0837   TRIG 59 05/06/2015 0837   HDL 53 05/06/2015 0837   CHOLHDL 2.4 05/06/2015 0837   VLDL 12 05/06/2015 0837   LDLCALC 64 05/06/2015 0837     ASSESSMENT:    1. Chronic combined systolic and diastolic CHF (congestive heart failure) (HCC)   2. Essential hypertension, benign   3. ICD - dual chamber Medtronic, July 2012   4. Type 2 diabetes mellitus with stage 2 chronic kidney disease, with long-term current use of insulin (HCC)   5. Paroxysmal atrial fibrillation (HCC)   6. Hyperlipidemia   7. Medication management      PLAN:  In order of problems listed above:  1.CMP/CHF: Appears to have compensated heart failure without clinical evidence of hypervolemia and with normal/stable thoracic impedance by device measurement. Reviewed the signs and symptoms of heart failure decompensation. Again review the importance of sodium restriction, daily weight monitoring. We discussed a way to adjust her diuretic dose based on her weight. Currently at target "dry weight" of 195 pounds. 2. HTN:  Excellent blood pressure control. She does not have any symptoms of hypotension. I am confident that once daily captopril is not a good choice and was switched to a longer acting agent and a lower dose. Start lisinopril 5 mg daily instead. 3. ICD: Normal device function. Continue remote downloads every 3 months and yearly office visits 4. CKD: Cautious adjustment of diuretics with monitoring of renal function due to the presence of diabetic nephropathy and chronic kidney disease 5. AFib: The reported episode of paroxysmal atrial fibrillation is very brief and I do not believe anticoagulation is justified at this point. Will continue monitoring via her device. 6. HLP: Recheck lipid profile    Medication Adjustments/Labs and Tests Ordered: Current medicines are reviewed at length with the patient today.  Concerns regarding medicines are outlined above.  Medication changes, Labs and Tests ordered today are listed in the Patient Instructions below. Patient Instructions  Medication Instructions: Dr Royann Shivers has recommended making the following medication changes: 1. STOP Captopril 2. START Lisinopril 5 mg - take 1 tablet by mouth daily  Labwork: Your physician recommends that you return for lab work at your convenience - FASTING.  Testing/Procedures: 1. Remote Transmission - Remote monitoring is used to monitor your Pacemaker of ICD from home. This monitoring reduces the number of office visits required to check your device to one time per year. It allows [place] to keep an eye on the functioning of your device to ensure it is working properly. You are scheduled for a device check from home on Tuesday, November 14th, 2017. You may send your transmission at any time that day. If you have a wireless device, the transmission will be sent automatically. After your physician reviews your transmission, you will receive a postcard with your next transmission date.  Follow-up: Dr Royann Shivers recommends that you  schedule a follow-up appointment in 12 months with a device check. You will receive a reminder letter in the mail two months in advance. If you don't receive a letter, please call our office to schedule the follow-up appointment.  If you need a refill on your cardiac medications before your next appointment, please call your pharmacy.      Signed, Thurmon Fair, MD  09/04/2015 5:37 PM    [facility] [address] [place], [place], [place]  [address] Phone: [phone]; Fax: [phone]

## 2015-09-03 NOTE — Patient Instructions (Signed)
Medication Instructions: Dr Royann Shivers has recommended making the following medication changes: 1. STOP Captopril 2. START Lisinopril 5 mg - take 1 tablet by mouth daily  Labwork: Your physician recommends that you return for lab work at your convenience - FASTING.  Testing/Procedures: 1. Remote Transmission - Remote monitoring is used to monitor your Pacemaker of ICD from home. This monitoring reduces the number of office visits required to check your device to one time per year. It allows [place] to keep an eye on the functioning of your device to ensure it is working properly. You are scheduled for a device check from home on Tuesday, November 14th, 2017. You may send your transmission at any time that day. If you have a wireless device, the transmission will be sent automatically. After your physician reviews your transmission, you will receive a postcard with your next transmission date.  Follow-up: Dr Royann Shivers recommends that you schedule a follow-up appointment in 12 months with a device check. You will receive a reminder letter in the mail two months in advance. If you don't receive a letter, please call our office to schedule the follow-up appointment.  If you need a refill on your cardiac medications before your next appointment, please call your pharmacy.

## 2015-09-04 ENCOUNTER — Encounter: Payer: Self-pay | Admitting: Cardiovascular Disease

## 2015-09-04 DIAGNOSIS — I5042 Chronic combined systolic (congestive) and diastolic (congestive) heart failure: Secondary | ICD-10-CM

## 2015-09-04 DIAGNOSIS — I480 Paroxysmal atrial fibrillation: Secondary | ICD-10-CM

## 2015-09-04 HISTORY — DX: Chronic combined systolic (congestive) and diastolic (congestive) heart failure: I50.42

## 2015-09-06 ENCOUNTER — Encounter: Payer: Self-pay | Admitting: Cardiovascular Disease

## 2015-09-21 ENCOUNTER — Other Ambulatory Visit: Payer: Self-pay | Admitting: "Endocrinology

## 2015-09-26 ENCOUNTER — Other Ambulatory Visit: Payer: Self-pay | Admitting: "Endocrinology

## 2015-09-26 LAB — COMPLETE METABOLIC PANEL WITH GFR
ALBUMIN: 3.9 g/dL (ref 3.6–5.1)
ALK PHOS: 76 U/L (ref 33–130)
ALT: 10 U/L (ref 6–29)
AST: 14 U/L (ref 10–35)
BILIRUBIN TOTAL: 0.5 mg/dL (ref 0.2–1.2)
BUN: 14 mg/dL (ref 7–25)
CALCIUM: 9.5 mg/dL (ref 8.6–10.4)
CO2: 27 mmol/L (ref 20–31)
CREATININE: 0.83 mg/dL (ref 0.60–0.93)
Chloride: 107 mmol/L (ref 98–110)
GFR, Est African American: 82 mL/min (ref >=60)
GFR, Est Non African American: 71 mL/min (ref >=60)
GLUCOSE: 161 mg/dL — AB (ref 65–99)
Potassium: 4.3 mmol/L (ref 3.5–5.3)
SODIUM: 141 mmol/L (ref 135–146)
TOTAL PROTEIN: 6.8 g/dL (ref 6.1–8.1)

## 2015-09-27 ENCOUNTER — Telehealth: Payer: Self-pay | Admitting: Cardiovascular Disease

## 2015-09-27 LAB — COMPREHENSIVE METABOLIC PANEL
ALK PHOS: 70 U/L (ref 33–130)
ALT: 9 U/L (ref 6–29)
AST: 15 U/L (ref 10–35)
Albumin: 3.7 g/dL (ref 3.6–5.1)
BILIRUBIN TOTAL: 0.5 mg/dL (ref 0.2–1.2)
BUN: 14 mg/dL (ref 7–25)
CALCIUM: 9.6 mg/dL (ref 8.6–10.4)
CO2: 24 mmol/L (ref 20–31)
Chloride: 104 mmol/L (ref 98–110)
Creat: 0.93 mg/dL (ref 0.60–0.93)
GLUCOSE: 156 mg/dL — AB (ref 65–99)
Potassium: 4.3 mmol/L (ref 3.5–5.3)
Sodium: 140 mmol/L (ref 135–146)
TOTAL PROTEIN: 6.8 g/dL (ref 6.1–8.1)

## 2015-09-27 LAB — LIPID PANEL
CHOLESTEROL: 159 mg/dL (ref 125–200)
HDL: 68 mg/dL (ref >=46)
LDL Cholesterol: 77 mg/dL (ref <130)
Total CHOL/HDL Ratio: 2.3 Ratio (ref <=5.0)
Triglycerides: 71 mg/dL (ref <150)
VLDL: 14 mg/dL (ref <30)

## 2015-09-27 LAB — HEMOGLOBIN A1C
Hgb A1c MFr Bld: 8.4 % — ABNORMAL HIGH (ref <5.7)
Mean Plasma Glucose: 194 mg/dL

## 2015-09-27 NOTE — Telephone Encounter (Signed)
Called patient with results. Patient verbalized understanding. 

## 2015-09-27 NOTE — Telephone Encounter (Signed)
New Message  Pt call stating she received a from RN.Marland Kitchen Pt did not get a name, but states she completed labs. Please call back to discuss.

## 2015-09-27 NOTE — Telephone Encounter (Signed)
Routed to [facility]

## 2015-10-02 ENCOUNTER — Encounter: Payer: Self-pay | Admitting: "Endocrinology

## 2015-10-02 ENCOUNTER — Ambulatory Visit (INDEPENDENT_AMBULATORY_CARE_PROVIDER_SITE_OTHER): Payer: Medicare Other | Admitting: "Endocrinology

## 2015-10-02 VITALS — BP 120/60 | HR 68 | Resp 18 | Ht 68.0 in | Wt 195.2 lb

## 2015-10-02 DIAGNOSIS — I10 Essential (primary) hypertension: Secondary | ICD-10-CM | POA: Diagnosis not present

## 2015-10-02 DIAGNOSIS — Z794 Long term (current) use of insulin: Secondary | ICD-10-CM

## 2015-10-02 DIAGNOSIS — E1122 Type 2 diabetes mellitus with diabetic chronic kidney disease: Secondary | ICD-10-CM | POA: Diagnosis not present

## 2015-10-02 DIAGNOSIS — E785 Hyperlipidemia, unspecified: Secondary | ICD-10-CM | POA: Diagnosis not present

## 2015-10-02 DIAGNOSIS — N182 Chronic kidney disease, stage 2 (mild): Secondary | ICD-10-CM | POA: Diagnosis not present

## 2015-10-02 NOTE — Progress Notes (Signed)
Subjective:    Patient ID: Diana Campbell, female    DOB: 07/06/1945, PCP Colette RibasGOLDING, JOHN CABOT, MD   Past Medical History:  Diagnosis Date  . Automatic implantable cardioverter-defibrillator in situ 07/2010  . Cardiac abnormality    with defibrilator  . Chronic combined systolic and diastolic CHF (congestive heart failure) (HCC) 09/04/2015  . Diabetes mellitus   . H/O cardiac arrest 2006  . Hypercholesteremia   . Hypertension    Past Surgical History:  Procedure Laterality Date  . 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  Marland Kitchen. CARDIAC CATHETERIZATION  04/14/2000   Normal coronary arteries.  Marland Kitchen. CARDIAC DEFIBRILLATOR PLACEMENT  07/21/2010   Medtronic Protecta XT DR, model#[id number], serial#[id number]  . CARDIOVASCULAR STRESS TEST  04/05/2000   Scintigraphic evidence of mild LV dilatation and global LV dysfunction with an EF 41% with mild inferolateral ischemia.  . COLONOSCOPY  03/30/2011   Procedure: COLONOSCOPY;  Surgeon: Dalia HeadingMark A Jenkins, MD;  Location: [facility];  Service: Gastroenterology;  Laterality: N/A;  . DORSAL COMPARTMENT RELEASE Right 08/11/2013   Procedure: RELEASE DORSAL COMPARTMENT (DEQUERVAIN);  Surgeon: Vickki HearingStanley E Harrison, MD;  Location: [facility];  Service: Orthopedics;  Laterality: Right;  . TRANSTHORACIC ECHOCARDIOGRAM  08/03/2011   EF 45-50%, mildly reduced LV systolic function.   Social History   Social History  . Marital status: Married    Spouse name: N/A  . Number of children: N/A  . Years of education: N/A   Social History Main Topics  . Smoking status: Never Smoker  . Smokeless tobacco: Current User    Types: Snuff     Comment: 3 dips per day  . Alcohol use No  . Drug use: No  . Sexual activity: Not Asked   Other Topics Concern  . None   Social History Narrative  . None   Outpatient Encounter  Prescriptions as of 10/02/2015  Medication Sig  . ACCU-CHEK AVIVA PLUS test strip TEST BLOOD SUGAR 4 TIMES A DAY (DX:E11.65)  . aspirin EC 81 MG tablet Take 1 tablet (81 mg total) by mouth daily.  Marland Kitchen. atorvastatin (LIPITOR) 10 MG tablet Take 1 tablet by mouth daily.  . calcium-vitamin D (OSCAL WITH D) 500-200 MG-UNIT per tablet Take 1 tablet by mouth daily.  . carvedilol (COREG) 12.5 MG tablet Take 0.5 tablets (6.25 mg total) by mouth 2 (two) times daily with a meal.  . Cholecalciferol (VITAMIN D PO) Take 1 tablet by mouth daily.  . furosemide (LASIX) 40 MG tablet Take 40 mg by mouth. Take 1 tab by mouth daily if weight is over 195lbs.  . gabapentin (NEURONTIN) 100 MG capsule Take 100 mg by mouth 3 (three) times daily.  Marland Kitchen. HUMALOG MIX 75/25 KWIKPEN (75-25) 100 UNIT/ML Kwikpen INJECT 26 UNITS INTO THE SKIN 2 TIMES DAILY.  Marland Kitchen. lisinopril (PRINIVIL,ZESTRIL) 5 MG tablet Take 1 tablet (5 mg total) by mouth daily.  . Multiple Vitamin (MULITIVITAMIN WITH MINERALS) TABS Take 1 tablet by mouth daily.  . potassium chloride (K-DUR) 10 MEQ tablet Take 10 mEq by mouth. Take only when taking furosemide   No facility-administered encounter medications on file as of 10/02/2015.    ALLERGIES: No Known Allergies VACCINATION STATUS:  There is no immunization history on file for this patient.  Diabetes She presents for her follow-up diabetic visit. She has type 2 diabetes mellitus. Onset time: She was diagnosed at  approximate age of 47 years. Her disease course has been worsening. There are no hypoglycemic associated symptoms. Pertinent negatives for hypoglycemia include no confusion, headaches, pallor or seizures. There are no diabetic associated symptoms. Pertinent negatives for diabetes include no chest pain, no polydipsia, no polyphagia and no polyuria. There are no hypoglycemic complications. Symptoms are worsening. Diabetic complications include nephropathy. Risk factors for coronary artery disease include  diabetes mellitus, dyslipidemia, hypertension, sedentary lifestyle and tobacco exposure. Current diabetic treatment includes intensive insulin program (For unclear reasons, she stayed off of her Lantus.). She is compliant with treatment some of the time. Her weight is stable. She is following a generally unhealthy diet. She has not had a previous visit with a dietitian (She declined a referral to CDE.). Her breakfast blood glucose range is generally >200 mg/dl. Her lunch blood glucose range is generally >200 mg/dl. Her dinner blood glucose range is generally >200 mg/dl. Her overall blood glucose range is >200 mg/dl. An ACE inhibitor/angiotensin II receptor blocker is being taken. Eye exam is current.  Hyperlipidemia This is a chronic problem. The current episode started more than 1 year ago. Pertinent negatives include no chest pain, myalgias or shortness of breath. Current antihyperlipidemic treatment includes statins. Risk factors for coronary artery disease include dyslipidemia, diabetes mellitus and a sedentary lifestyle.  Hypertension This is a chronic problem. The current episode started more than 1 year ago. The problem is controlled. Pertinent negatives include no chest pain, headaches, palpitations or shortness of breath. Past treatments include ACE inhibitors. Hypertensive end-organ damage includes kidney disease.     Review of Systems  Constitutional: Negative for unexpected weight change.  HENT: Negative for trouble swallowing and voice change.   Eyes: Negative for visual disturbance.  Respiratory: Negative for cough, shortness of breath and wheezing.   Cardiovascular: Negative for chest pain, palpitations and leg swelling.  Gastrointestinal: Negative for nausea, vomiting and diarrhea.  Endocrine: Negative for cold intolerance, heat intolerance, polydipsia, polyphagia and polyuria.  Musculoskeletal: Negative for myalgias and arthralgias.  Skin: Negative for color change, pallor, rash and  wound.  Neurological: Negative for seizures and headaches.  Psychiatric/Behavioral: Negative for suicidal ideas and confusion.    Objective:    BP 120/60   Pulse 68   Resp 18   Ht 5\' 8"  (1.727 m)   Wt 195 lb 3.2 oz (88.5 kg)   SpO2 98%   BMI 29.68 kg/m   Wt Readings from Last 3 Encounters:  10/02/15 195 lb 3.2 oz (88.5 kg)  09/03/15 195 lb (88.5 kg)  06/20/15 194 lb (88 kg)    Physical Exam  Constitutional: She is oriented to person, place, and time. She appears well-developed.  HENT:  Head: Normocephalic and atraumatic.  Eyes: EOM are normal.  Neck: Normal range of motion. Neck supple. No tracheal deviation present. No thyromegaly present.  Cardiovascular: Normal rate and regular rhythm.   Pulmonary/Chest: Effort normal and breath sounds normal.  Abdominal: Soft. Bowel sounds are normal. There is no tenderness. There is no guarding.  Musculoskeletal: Normal range of motion. She exhibits no edema.  Neurological: She is alert and oriented to person, place, and time. She has normal reflexes. No cranial nerve deficit. Coordination normal.  Skin: Skin is warm and dry. No rash noted. No erythema. No pallor.  Psychiatric: She has a normal mood and affect. Judgment normal.    Results for orders placed or performed in visit on 09/26/15  COMPLETE METABOLIC PANEL WITH GFR  Result Value Ref Range  Sodium 141 135 - 146 mmol/L   Potassium 4.3 3.5 - 5.3 mmol/L   Chloride 107 98 - 110 mmol/L   CO2 27 20 - 31 mmol/L   Glucose, Bld 161 (H) 65 - 99 mg/dL   BUN 14 7 - 25 mg/dL   Creat 5.67 0.14 - 1.03 mg/dL   Total Bilirubin 0.5 0.2 - 1.2 mg/dL   Alkaline Phosphatase 76 33 - 130 U/L   AST 14 10 - 35 U/L   ALT 10 6 - 29 U/L   Total Protein 6.8 6.1 - 8.1 g/dL   Albumin 3.9 3.6 - 5.1 g/dL   Calcium 9.5 8.6 - 01.3 mg/dL   GFR, Est African American 82 >=60 mL/min   GFR, Est Non African American 71 >=60 mL/min  Hemoglobin A1c  Result Value Ref Range   Hgb A1c MFr Bld 8.4 (H) <5.7 %    Mean Plasma Glucose 194 mg/dL  Diabetic Labs (most recent): Lab Results  Component Value Date   HGBA1C CANCELED 09/26/2015   HGBA1C 8.4 (H) 09/26/2015   HGBA1C 10.0 (H) 06/11/2015     Assessment & Plan:   1. Type 2 diabetes mellitus with stage 2 chronic kidney disease, with long-term current use of insulin (HCC)  - her diabetes is  complicated by stable CKD ( which is improving) and patient remains at a high risk for more acute and chronic complications of diabetes which include CAD, CVA, CKD, retinopathy, and neuropathy. These are all discussed in detail with the patient.  Patient came with Improved glucose profile, and  recent A1c 8.4% improving from 10% . - Glucose logs and insulin administration records pertaining to this visit,  to be scanned into patient's records.  Recent labs reviewed.   - I have re-counseled the patient on diet management and weight loss  by adopting a carbohydrate restricted / protein rich  Diet.  - Suggestion is made for patient to avoid simple carbohydrates   from their diet including Cakes , Desserts, Ice Cream,  Soda (  diet and regular) , Sweet Tea , Candies,  Chips, Cookies, Artificial Sweeteners,   and "Sugar-free" Products .  This will help patient to have stable blood glucose profile and potentially avoid unintended  Weight gain.  - Patient is advised to stick to a routine mealtimes to eat 3 meals  a day and avoid unnecessary snacks ( to snack only to correct hypoglycemia).  - The patient  declined referral to a  CDE for individualized DM education.  - I have approached patient with the following individualized plan to manage diabetes and patient agrees.   -  I will continue Humalog 75/25   26 units with breakfast and 26 units with supper associated with associated with monitoring of BG AC and HS.   -Adjustment parameters for hypo and hyperglycemia were given in a written document to patient. -Patient is encouraged to call clinic for blood glucose  levels less than 70 or above 200 mg /dl. -She could not afford Invokana. -Her labs show her renal function is improving. She would have benefited from metformin however she reports intolerance to metformin.   - Patient specific target  for A1c; LDL, HDL, Triglycerides, and  Waist Circumference were discussed in detail.  2) BP/HTN: Controlled. Continue current medications including ACEI/ARB. 3) Lipids/HPL:  continue statins. 4)  Weight/Diet: CDE consult in progress, exercise, and carbohydrates information provided.  5) Chronic Care/Health Maintenance:  -Patient is on ACEI/ARB and Statin medications and encouraged to  continue to follow up with Ophthalmology, Podiatrist at least yearly or according to recommendations, and advised to  stay away from smoking. I have recommended yearly flu vaccine and pneumonia vaccination at least every 5 years; moderate intensity exercise for up to 150 minutes weekly; and  sleep for at least 7 hours a day.  - 25 minutes of time was spent on the care of this patient , 50% of which was applied for counseling on diabetes complications and their preventions.  - I advised patient to maintain close follow up with Colette Ribas, MD for primary care needs.  Patient is asked to bring meter and  blood glucose logs during their next visit.   Follow up plan: -Return in about 3 months (around 01/01/2016) for follow up with pre-visit labs, meter, and logs.  Marquis Lunch, MD Phone: [phone]  Fax: [phone]   10/02/2015, 8:39 AM

## 2015-10-02 NOTE — Patient Instructions (Signed)

## 2015-10-22 ENCOUNTER — Ambulatory Visit (INDEPENDENT_AMBULATORY_CARE_PROVIDER_SITE_OTHER): Payer: Medicare Other | Admitting: Orthopedic Surgery

## 2015-10-22 VITALS — BP 126/68 | HR 80 | Ht 67.0 in | Wt 192.0 lb

## 2015-10-22 DIAGNOSIS — M75101 Unspecified rotator cuff tear or rupture of right shoulder, not specified as traumatic: Secondary | ICD-10-CM | POA: Diagnosis not present

## 2015-10-22 DIAGNOSIS — G5601 Carpal tunnel syndrome, right upper limb: Secondary | ICD-10-CM

## 2015-10-22 NOTE — Patient Instructions (Signed)

## 2015-10-22 NOTE — Progress Notes (Signed)
Patient ID: Diana Campbell, female   DOB: Apr 03, 1945, 70 y.o.   MRN: [id number]  Chief Complaint  Patient presents with  . Hand Pain    Right hand pain, no injury.    HPI Diana Campbell is a 70 y.o. female.  Presents for evaluation of her right hand and right shoulder  Right hand started 1 month ago numbness and tingling thumb index long ring finger. She is on gabapentin for diabetic related neuropathy no improvement but no other treatment  2 weeks ago her right shoulder started hurting she lost motion no trauma chest painful forward elevation which is limited.  Denies neck pain  Review of Systems Review of Systems 1. Denies neck pain 2. Reports no fever   Past Medical History:  Diagnosis Date  . Automatic implantable cardioverter-defibrillator in situ 07/2010  . Cardiac abnormality    with defibrilator  . Chronic combined systolic and diastolic CHF (congestive heart failure) (HCC) 09/04/2015  . Diabetes mellitus   . H/O cardiac arrest 2006  . Hypercholesteremia   . Hypertension     Past Surgical History:  Procedure Laterality Date  . 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  Marland Kitchen CARDIAC CATHETERIZATION  04/14/2000   Normal coronary arteries.  Marland Kitchen CARDIAC DEFIBRILLATOR PLACEMENT  07/21/2010   Medtronic Protecta XT DR, model#[id number], serial#[id number]  . CARDIOVASCULAR STRESS TEST  04/05/2000   Scintigraphic evidence of mild LV dilatation and global LV dysfunction with an EF 41% with mild inferolateral ischemia.  . COLONOSCOPY  03/30/2011   Procedure: COLONOSCOPY;  Surgeon: Dalia Heading, MD;  Location: [facility];  Service: Gastroenterology;  Laterality: N/A;  . DORSAL COMPARTMENT RELEASE Right 08/11/2013   Procedure: RELEASE DORSAL COMPARTMENT (DEQUERVAIN);  Surgeon: Vickki Hearing, MD;  Location: [facility];  Service:  Orthopedics;  Laterality: Right;  . TRANSTHORACIC ECHOCARDIOGRAM  08/03/2011   EF 45-50%, mildly reduced LV systolic function.    Social History Social History  Substance Use Topics  . Smoking status: Never Smoker  . Smokeless tobacco: Current User    Types: Snuff     Comment: 3 dips per day  . Alcohol use No    No Known Allergies  Current Meds  Medication Sig  . atorvastatin (LIPITOR) 10 MG tablet Take 1 tablet by mouth daily.  . carvedilol (COREG) 12.5 MG tablet Take 0.5 tablets (6.25 mg total) by mouth 2 (two) times daily with a meal.  . gabapentin (NEURONTIN) 100 MG capsule Take 100 mg by mouth 3 (three) times daily.  Marland Kitchen lisinopril (PRINIVIL,ZESTRIL) 5 MG tablet Take 1 tablet (5 mg total) by mouth daily.  . potassium chloride (K-DUR) 10 MEQ tablet Take 10 mEq by mouth. Take only when taking furosemide      Physical Exam Physical Exam BP 126/68   Pulse 80   Ht 5\' 7"  (1.702 m)   Wt 192 lb (87.1 kg)   BMI 30.07 kg/m   Gen. appearance. The patient is well-developed and well-nourished, grooming and hygiene are normal. There are no gross congenital abnormalities  The patient is alert and oriented to person place and time  Mood and affect are normal  Ambulation No abnormalities  Examination reveals the following: On inspection we find right shoulder with painful active flexion only 90 passive range of motion is painful at 120 tenderness in the rotator interval. Stable in abduction external rotation. External rotation  and internal rotation strength grade 5 supraspinatus not tested because of motion deficit  She has sensory loss in the thumb index long and ring finger  Skin we find no rash ulceration or erythema  Impression vascular system shows no peripheral edema  Left shoulder full range of motion normal strength  Data Reviewed No additional data  Assessment    Carpal tunnel syndrome on the right  Rotator cuff syndrome on the right      Plan     Carpal tunnel splint continue gabapentin  Inject right shoulder  Return 6 weeks       Fuller CanadaStanley Rikayla Demmon 10/22/2015, 10:11 AM

## 2015-12-03 ENCOUNTER — Ambulatory Visit (INDEPENDENT_AMBULATORY_CARE_PROVIDER_SITE_OTHER): Payer: Medicare Other | Admitting: *Deleted

## 2015-12-03 DIAGNOSIS — I5042 Chronic combined systolic (congestive) and diastolic (congestive) heart failure: Secondary | ICD-10-CM

## 2015-12-03 DIAGNOSIS — I428 Other cardiomyopathies: Secondary | ICD-10-CM

## 2015-12-03 NOTE — Progress Notes (Signed)
Remote ICD transmission.   

## 2015-12-04 ENCOUNTER — Encounter: Payer: Self-pay | Admitting: Orthopedic Surgery

## 2015-12-04 ENCOUNTER — Ambulatory Visit (INDEPENDENT_AMBULATORY_CARE_PROVIDER_SITE_OTHER): Payer: Medicare Other | Admitting: Orthopedic Surgery

## 2015-12-04 DIAGNOSIS — G5601 Carpal tunnel syndrome, right upper limb: Secondary | ICD-10-CM | POA: Diagnosis not present

## 2015-12-04 DIAGNOSIS — M75121 Complete rotator cuff tear or rupture of right shoulder, not specified as traumatic: Secondary | ICD-10-CM

## 2015-12-04 NOTE — Progress Notes (Deleted)
Patient ID: Diana Campbell, female   DOB: 11/22/1945, 70 y.o.   MRN: [id number]  Chief Complaint  Patient presents with  . Follow-up    RT CARPAL TUNNEL SYNDROME, RT ROTATOR CUFF SYNDROME    HPI Diana Campbell is a 70 y.o. female.  ***  Review of Systems Review of Systems 1. *** 2. ***   Past Medical History:  Diagnosis Date  . Automatic implantable cardioverter-defibrillator in situ 07/2010  . Cardiac abnormality    with defibrilator  . Chronic combined systolic and diastolic CHF (congestive heart failure) (HCC) 09/04/2015  . Diabetes mellitus   . H/O cardiac arrest 2006  . Hypercholesteremia   . Hypertension     Past Surgical History:  Procedure Laterality Date  . 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  Marland Kitchen. CARDIAC CATHETERIZATION  04/14/2000   Normal coronary arteries.  Marland Kitchen. CARDIAC DEFIBRILLATOR PLACEMENT  07/21/2010   Medtronic Protecta XT DR, model#[id number], serial#[id number]  . CARDIOVASCULAR STRESS TEST  04/05/2000   Scintigraphic evidence of mild LV dilatation and global LV dysfunction with an EF 41% with mild inferolateral ischemia.  . COLONOSCOPY  03/30/2011   Procedure: COLONOSCOPY;  Surgeon: Dalia HeadingMark A Jenkins, MD;  Location: [facility];  Service: Gastroenterology;  Laterality: N/A;  . DORSAL COMPARTMENT RELEASE Right 08/11/2013   Procedure: RELEASE DORSAL COMPARTMENT (DEQUERVAIN);  Surgeon: Vickki HearingStanley E Emmagene Ortner, MD;  Location: [facility];  Service: Orthopedics;  Laterality: Right;  . TRANSTHORACIC ECHOCARDIOGRAM  08/03/2011   EF 45-50%, mildly reduced LV systolic function.    Social History Social History  Substance Use Topics  . Smoking status: Never Smoker  . Smokeless tobacco: Current User    Types: Snuff     Comment: 3 dips per day  . Alcohol use No    No Known Allergies  Current Meds  Medication Sig  .  ACCU-CHEK AVIVA PLUS test strip TEST BLOOD SUGAR 4 TIMES A DAY (DX:E11.65)  . aspirin EC 81 MG tablet Take 1 tablet (81 mg total) by mouth daily.  Marland Kitchen. atorvastatin (LIPITOR) 10 MG tablet Take 1 tablet by mouth daily.  . calcium-vitamin D (OSCAL WITH D) 500-200 MG-UNIT per tablet Take 1 tablet by mouth daily.  . carvedilol (COREG) 12.5 MG tablet Take 0.5 tablets (6.25 mg total) by mouth 2 (two) times daily with a meal.  . Cholecalciferol (VITAMIN D PO) Take 1 tablet by mouth daily.  . furosemide (LASIX) 40 MG tablet Take 40 mg by mouth. Take 1 tab by mouth daily if weight is over 195lbs.  . gabapentin (NEURONTIN) 100 MG capsule Take 100 mg by mouth 3 (three) times daily.  Marland Kitchen. HUMALOG MIX 75/25 KWIKPEN (75-25) 100 UNIT/ML Kwikpen INJECT 26 UNITS INTO THE SKIN 2 TIMES DAILY.  Marland Kitchen. lisinopril (PRINIVIL,ZESTRIL) 5 MG tablet Take 1 tablet (5 mg total) by mouth daily.  . Multiple Vitamin (MULITIVITAMIN WITH MINERALS) TABS Take 1 tablet by mouth daily.  . potassium chloride (K-DUR) 10 MEQ tablet Take 10 mEq by mouth. Take only when taking furosemide      Physical Exam Physical Exam There were no vitals taken for this visit.  Gen. appearance. The patient is well-developed and well-nourished, grooming and hygiene are normal. There are no gross congenital abnormalities  The patient is alert and oriented to person place and time  Mood and affect are normal  Ambulation ***  Examination reveals the following:  On inspection we find ***  With the range of motion of  ***  Stability tests were normal  ***  Strength tests revealed grade 5 motor strength  Skin we find no rash ulceration or erythema  Sensation remains intact  Impression vascular system shows no peripheral edema  Data Reviewed ***  Assessment    ***    Plan    ***       [name] 12/04/2015, 10:06 AM

## 2015-12-04 NOTE — Patient Instructions (Signed)
Shoulder arthrogram

## 2015-12-04 NOTE — Progress Notes (Signed)
Patient ID: Diana Campbell, female   DOB: May 05, 1945, 71 y.o.   MRN: [id number]  Chief Complaint  Patient presents with  . Follow-up    RT CARPAL TUNNEL SYNDROME, RT ROTATOR CUFF SYNDROME    HPI Diana Campbell is a 70 y.o. female.   HPI Follow-up for carpal tunnel syndrome right wrist which is still symptomatic despite treatment with gabapentin splinting  She is also continued to have right shoulder pain and weakness  She has a defibrillator so we will have to use a right shoulder arthrogram for diagnosis rotator cuff tear  Discussed further treatment on the carpal tunnel on the right and that will require release  Review of Systems Review of Systems No shortness of breath or chest pain reported at this time  Past Medical History:  Diagnosis Date  . Automatic implantable cardioverter-defibrillator in situ 07/2010  . Cardiac abnormality    with defibrilator  . Chronic combined systolic and diastolic CHF (congestive heart failure) (HCC) 09/04/2015  . Diabetes mellitus   . H/O cardiac arrest 2006  . Hypercholesteremia   . Hypertension     Examination There were no vitals taken for this visit.  Gen. appearance the patient's appearance is normal with normal grooming and  hygiene The patient is oriented to person place and time Mood and affect are normal   Ortho Exam Gait is remarkable for normal ambulation   Right shoulder is tender she has a Popeye deformity her range of motion is limited to 90 of flexion the shoulder is stable she has weakness in the rotator cuff  The skin is otherwise normal  Left shoulder full range of motion strength and stability are normal no Popeye deformity is noted and no weakness is noted. The shoulder remain stable  Right wrist tenderness over the carpal tunnel positive compression test decreased sensation median nerve distribution range of motion normal  Normal perfusion   Medical decision-making Diagnosis, Data, Plan  (risk)  Encounter Diagnoses  Name Primary?  . Carpal tunnel syndrome of right wrist Yes  . Complete tear of right rotator cuff      [name], MD 12/04/2015 12:09 PM

## 2015-12-04 NOTE — Addendum Note (Signed)
Addended by: Adella Hare B on: 12/04/2015 03:11 PM   Modules accepted: Orders, SmartSet

## 2015-12-10 ENCOUNTER — Ambulatory Visit (HOSPITAL_COMMUNITY)
Admission: RE | Admit: 2015-12-10 | Discharge: 2015-12-10 | Disposition: A | Discharge status: Home / Self Care | Payer: Medicare Other | Source: Ambulatory Visit | Attending: Orthopedic Surgery | Admitting: Orthopedic Surgery

## 2015-12-10 DIAGNOSIS — M75121 Complete rotator cuff tear or rupture of right shoulder, not specified as traumatic: Secondary | ICD-10-CM | POA: Insufficient documentation

## 2015-12-10 MED ORDER — POVIDONE-IODINE 10 % EX SOLN
CUTANEOUS | Status: AC
  Filled 2015-12-10: qty 15

## 2015-12-10 MED ORDER — LIDOCAINE-EPINEPHRINE (PF) 1 %-1:200000 IJ SOLN
INTRAMUSCULAR | Status: AC
  Filled 2015-12-10: qty 30

## 2015-12-10 MED ORDER — LIDOCAINE HCL (PF) 2 % IJ SOLN
INTRAMUSCULAR | Status: AC
  Filled 2015-12-10: qty 10

## 2015-12-10 MED ORDER — IOPAMIDOL (ISOVUE-300) INJECTION 61%
INTRAVENOUS | Status: AC
  Administered 2015-12-10: 18 mL
  Filled 2015-12-10: qty 50

## 2015-12-10 MED ORDER — IOPAMIDOL (ISOVUE-300) INJECTION 61%: 50.0000 mL | Freq: Once | INTRAVENOUS | Status: DC | PRN

## 2015-12-10 MED ORDER — LIDOCAINE HCL (PF) 1 % IJ SOLN
INTRAMUSCULAR | Status: AC
  Filled 2015-12-10: qty 5

## 2015-12-13 LAB — CUP PACEART REMOTE DEVICE CHECK
Battery Voltage: 2.92 V
Brady Statistic AP VP Percent: 0.03 %
Brady Statistic AS VS Percent: 11.55 %
Brady Statistic RA Percent Paced: 88.44 %
HighPow Impedance: 45 Ohm
HighPow Impedance: 475 Ohm
HighPow Impedance: 56 Ohm
Implantable Lead Implant Date: 20120702
Implantable Lead Implant Date: 20120702
Implantable Lead Location: 753860
Implantable Lead Model: 6947
Implantable Pulse Generator Implant Date: 20120702
Lead Channel Impedance Value: 418 Ohm
Lead Channel Impedance Value: 551 Ohm
Lead Channel Pacing Threshold Amplitude: 0.75 V
Lead Channel Pacing Threshold Pulse Width: 0.4 ms
Lead Channel Sensing Intrinsic Amplitude: 2.5 mV
Lead Channel Setting Pacing Amplitude: 1.75 V
MDC IDC LEAD LOCATION: 753859
MDC IDC MSMT LEADCHNL RA SENSING INTR AMPL: 2.5 mV
MDC IDC MSMT LEADCHNL RV PACING THRESHOLD AMPLITUDE: 0.625 V
MDC IDC MSMT LEADCHNL RV PACING THRESHOLD PULSEWIDTH: 0.4 ms
MDC IDC MSMT LEADCHNL RV SENSING INTR AMPL: 9.375 mV
MDC IDC MSMT LEADCHNL RV SENSING INTR AMPL: 9.375 mV
MDC IDC SESS DTM: 20171114140722
MDC IDC SET LEADCHNL RV PACING AMPLITUDE: 2 V
MDC IDC SET LEADCHNL RV PACING PULSEWIDTH: 0.4 ms
MDC IDC SET LEADCHNL RV SENSING SENSITIVITY: 0.3 mV
MDC IDC STAT BRADY AP VS PERCENT: 88.41 %
MDC IDC STAT BRADY AS VP PERCENT: 0.01 %
MDC IDC STAT BRADY RV PERCENT PACED: 0.04 %

## 2015-12-16 ENCOUNTER — Ambulatory Visit (INDEPENDENT_AMBULATORY_CARE_PROVIDER_SITE_OTHER): Payer: Medicare Other | Admitting: Orthopedic Surgery

## 2015-12-16 ENCOUNTER — Encounter: Payer: Self-pay | Admitting: Orthopedic Surgery

## 2015-12-16 DIAGNOSIS — M75101 Unspecified rotator cuff tear or rupture of right shoulder, not specified as traumatic: Secondary | ICD-10-CM | POA: Diagnosis not present

## 2015-12-16 DIAGNOSIS — G5601 Carpal tunnel syndrome, right upper limb: Secondary | ICD-10-CM

## 2015-12-16 NOTE — Progress Notes (Signed)
Patient ID: Diana Campbell, female   DOB: [birth year], 70 y.o.   MRN: [id number]  Chief Complaint  Patient presents with  . Follow-up    right shoulder arthrogram review    HPI Diana Campbell is a 70 y.o. female.   HPI  Right shoulder pain improved after injection  Review of Systems Review of Systems  Carpal tunnel symptoms numbness and tingling right hand Physical Exam There were no vitals taken for this visit.   Physical Exam Report of arthrogram FINDINGS: Patient performed active and passive range of motion to distribute contrast throughout RIGHT shoulder joint.   Diffuse osseous demineralization.   AC joint alignment normal.   Mild glenohumeral joint space narrowing.   Normal distribution of contrast throughout RIGHT shoulder joint.   Small focal area of articular cartilage thinning/irregularity at the RIGHT humeral head.   No extravasation of contrast material into subacromial/subdeltoid bursa to suggest rotator cuff tear.   IMPRESSION: Technically successful RIGHT shoulder arthrography.   No evidence of rotator cuff tear.   Osseous demineralization with mild degenerative changes of the RIGHT glenohumeral joint.     Electronically Signed   By: Ulyses Southward M.D.   On: 12/10/2015 12:45   After reviewing this report I agree that there is no rotator cuff tear  The patient will proceed with carpal tunnel release of the right hand

## 2015-12-20 ENCOUNTER — Other Ambulatory Visit: Payer: Self-pay | Admitting: "Endocrinology

## 2015-12-30 ENCOUNTER — Other Ambulatory Visit: Payer: Self-pay | Admitting: "Endocrinology

## 2015-12-30 LAB — COMPREHENSIVE METABOLIC PANEL
ALT: 9 U/L (ref 6–29)
AST: 14 U/L (ref 10–35)
Albumin: 3.7 g/dL (ref 3.6–5.1)
Alkaline Phosphatase: 66 U/L (ref 33–130)
BILIRUBIN TOTAL: 0.5 mg/dL (ref 0.2–1.2)
BUN: 13 mg/dL (ref 7–25)
CHLORIDE: 106 mmol/L (ref 98–110)
CO2: 30 mmol/L (ref 20–31)
CREATININE: 0.89 mg/dL (ref 0.60–0.93)
Calcium: 9.3 mg/dL (ref 8.6–10.4)
GLUCOSE: 180 mg/dL — AB (ref 65–99)
Potassium: 4.4 mmol/L (ref 3.5–5.3)
SODIUM: 141 mmol/L (ref 135–146)
Total Protein: 6.2 g/dL (ref 6.1–8.1)

## 2015-12-30 LAB — HEMOGLOBIN A1C
Hgb A1c MFr Bld: 8.5 % — ABNORMAL HIGH (ref <5.7)
Mean Plasma Glucose: 197 mg/dL

## 2016-01-06 ENCOUNTER — Encounter: Payer: Self-pay | Admitting: "Endocrinology

## 2016-01-06 ENCOUNTER — Ambulatory Visit (INDEPENDENT_AMBULATORY_CARE_PROVIDER_SITE_OTHER): Payer: Medicare Other | Admitting: "Endocrinology

## 2016-01-06 VITALS — BP 106/64 | HR 85 | Ht 67.0 in | Wt 194.0 lb

## 2016-01-06 DIAGNOSIS — Z794 Long term (current) use of insulin: Secondary | ICD-10-CM | POA: Diagnosis not present

## 2016-01-06 DIAGNOSIS — E782 Mixed hyperlipidemia: Secondary | ICD-10-CM

## 2016-01-06 DIAGNOSIS — N182 Chronic kidney disease, stage 2 (mild): Secondary | ICD-10-CM | POA: Diagnosis not present

## 2016-01-06 DIAGNOSIS — I10 Essential (primary) hypertension: Secondary | ICD-10-CM | POA: Diagnosis not present

## 2016-01-06 DIAGNOSIS — E1122 Type 2 diabetes mellitus with diabetic chronic kidney disease: Secondary | ICD-10-CM | POA: Diagnosis not present

## 2016-01-06 NOTE — Patient Instructions (Signed)

## 2016-01-06 NOTE — Progress Notes (Signed)
Subjective:    Patient ID: Diana Campbell, female    DOB: [birth year], PCP Colette Ribas, MD   Past Medical History:  Diagnosis Date  . Automatic implantable cardioverter-defibrillator in situ 07/2010  . Cardiac abnormality    with defibrilator  . Chronic combined systolic and diastolic CHF (congestive heart failure) (HCC) 09/04/2015  . Diabetes mellitus   . H/O cardiac arrest 2006  . Hypercholesteremia   . Hypertension    Past Surgical History:  Procedure Laterality Date  . 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  Marland Kitchen CARDIAC CATHETERIZATION  04/14/2000   Normal coronary arteries.  Marland Kitchen CARDIAC DEFIBRILLATOR PLACEMENT  07/21/2010   Medtronic Protecta XT DR, model#[id number], serial#[id number]  . CARDIOVASCULAR STRESS TEST  04/05/2000   Scintigraphic evidence of mild LV dilatation and global LV dysfunction with an EF 41% with mild inferolateral ischemia.  . COLONOSCOPY  03/30/2011   Procedure: COLONOSCOPY;  Surgeon: Dalia Heading, MD;  Location: [facility];  Service: Gastroenterology;  Laterality: N/A;  . DORSAL COMPARTMENT RELEASE Right 08/11/2013   Procedure: RELEASE DORSAL COMPARTMENT (DEQUERVAIN);  Surgeon: Vickki Hearing, MD;  Location: [facility];  Service: Orthopedics;  Laterality: Right;  . TRANSTHORACIC ECHOCARDIOGRAM  08/03/2011   EF 45-50%, mildly reduced LV systolic function.   Social History   Social History  . Marital status: Married    Spouse name: N/A  . Number of children: N/A  . Years of education: N/A   Social History Main Topics  . Smoking status: Never Smoker  . Smokeless tobacco: Current User    Types: Snuff     Comment: 3 dips per day  . Alcohol use No  . Drug use: No  . Sexual activity: Not Asked   Other Topics Concern  . None   Social History Narrative  . None   Outpatient Encounter  Prescriptions as of 01/06/2016  Medication Sig  . ACCU-CHEK AVIVA PLUS test strip TEST BLOOD SUGAR 4 TIMES A DAY (DX:E11.65)  . aspirin EC 81 MG tablet Take 1 tablet (81 mg total) by mouth daily.  Marland Kitchen atorvastatin (LIPITOR) 10 MG tablet Take 1 tablet by mouth daily.  . calcium-vitamin D (OSCAL WITH D) 500-200 MG-UNIT per tablet Take 1 tablet by mouth daily.  . carvedilol (COREG) 12.5 MG tablet Take 0.5 tablets (6.25 mg total) by mouth 2 (two) times daily with a meal.  . Cholecalciferol (VITAMIN D PO) Take 1 tablet by mouth daily.  . furosemide (LASIX) 40 MG tablet Take 40 mg by mouth. Take 1 tab by mouth daily if weight is over 195lbs.  . gabapentin (NEURONTIN) 100 MG capsule Take 100 mg by mouth 3 (three) times daily.  Marland Kitchen HUMALOG MIX 75/25 KWIKPEN (75-25) 100 UNIT/ML Kwikpen INJECT 26 UNITS INTO THE SKIN 2 TIMES DAILY.  Marland Kitchen lisinopril (PRINIVIL,ZESTRIL) 5 MG tablet Take 1 tablet (5 mg total) by mouth daily.  . Multiple Vitamin (MULITIVITAMIN WITH MINERALS) TABS Take 1 tablet by mouth daily.  . potassium chloride (K-DUR) 10 MEQ tablet Take 10 mEq by mouth. Take only when taking furosemide   No facility-administered encounter medications on file as of 01/06/2016.    ALLERGIES: No Known Allergies VACCINATION STATUS:  There is no immunization history on file for this patient.  Diabetes She presents for her follow-up diabetic visit. She has type 2 diabetes mellitus. Onset time: She was diagnosed at  approximate age of 67 years. Her disease course has been worsening. There are no hypoglycemic associated symptoms. Pertinent negatives for hypoglycemia include no confusion, headaches, pallor or seizures. There are no diabetic associated symptoms. Pertinent negatives for diabetes include no chest pain, no polydipsia, no polyphagia and no polyuria. There are no hypoglycemic complications. Symptoms are worsening. Diabetic complications include nephropathy. Risk factors for coronary artery disease include  diabetes mellitus, dyslipidemia, hypertension, sedentary lifestyle and tobacco exposure. Current diabetic treatment includes intensive insulin program (For unclear reasons, she stayed off of her Lantus.). She is compliant with treatment some of the time. Her weight is stable. She is following a generally unhealthy diet. She has not had a previous visit with a dietitian (She declined a referral to CDE.)  Her overall blood glucose range is 180-200 mg/dl. An ACE inhibitor/angiotensin II receptor blocker is being taken. Eye exam is current.  Hyperlipidemia This is a chronic problem. The current episode started more than 1 year ago. Pertinent negatives include no chest pain, myalgias or shortness of breath. Current antihyperlipidemic treatment includes statins. Risk factors for coronary artery disease include dyslipidemia, diabetes mellitus and a sedentary lifestyle.  Hypertension This is a chronic problem. The current episode started more than 1 year ago. The problem is controlled. Pertinent negatives include no chest pain, headaches, palpitations or shortness of breath. Past treatments include ACE inhibitors. Hypertensive end-organ damage includes kidney disease.     Review of Systems  Constitutional: Negative for unexpected weight change.  HENT: Negative for trouble swallowing and voice change.   Eyes: Negative for visual disturbance.  Respiratory: Negative for cough, shortness of breath and wheezing.   Cardiovascular: Negative for chest pain, palpitations and leg swelling.  Gastrointestinal: Negative for nausea, vomiting and diarrhea.  Endocrine: Negative for cold intolerance, heat intolerance, polydipsia, polyphagia and polyuria.  Musculoskeletal: Negative for myalgias and arthralgias.  Skin: Negative for color change, pallor, rash and wound.  Neurological: Negative for seizures and headaches.  Psychiatric/Behavioral: Negative for suicidal ideas and confusion.    Objective:    BP 106/64    Pulse 85   Ht 5\' 7"  (1.702 m)   Wt 194 lb (88 kg)   BMI 30.38 kg/m   Wt Readings from Last 3 Encounters:  01/06/16 194 lb (88 kg)  10/22/15 192 lb (87.1 kg)  10/02/15 195 lb 3.2 oz (88.5 kg)    Physical Exam  Constitutional: She is oriented to person, place, and time. She appears well-developed.  HENT:  Head: Normocephalic and atraumatic.  Eyes: EOM are normal.  Neck: Normal range of motion. Neck supple. No tracheal deviation present. No thyromegaly present.  Cardiovascular: Normal rate and regular rhythm.   Pulmonary/Chest: Effort normal and breath sounds normal.  Abdominal: Soft. Bowel sounds are normal. There is no tenderness. There is no guarding.  Musculoskeletal: Normal range of motion. She exhibits no edema.  Neurological: She is alert and oriented to person, place, and time. She has normal reflexes. No cranial nerve deficit. Coordination normal.  Skin: Skin is warm and dry. No rash noted. No erythema. No pallor.  Psychiatric: She has a normal mood and affect. Judgment normal.    Results for orders placed or performed in visit on 12/30/15  Comprehensive metabolic panel  Result Value Ref Range   Sodium 141 135 - 146 mmol/L   Potassium 4.4 3.5 - 5.3 mmol/L   Chloride 106 98 - 110 mmol/L   CO2 30 20 - 31 mmol/L   Glucose, Bld 180 (H) 65 -  99 mg/dL   BUN 13 7 - 25 mg/dL   Creat 4.090.89 8.110.60 - 9.140.93 mg/dL   Total Bilirubin 0.5 0.2 - 1.2 mg/dL   Alkaline Phosphatase 66 33 - 130 U/L   AST 14 10 - 35 U/L   ALT 9 6 - 29 U/L   Total Protein 6.2 6.1 - 8.1 g/dL   Albumin 3.7 3.6 - 5.1 g/dL   Calcium 9.3 8.6 - 78.210.4 mg/dL  Hemoglobin N5AA1c  Result Value Ref Range   Hgb A1c MFr Bld 8.5 (H) <5.7 %   Mean Plasma Glucose 197 mg/dL  Diabetic Labs (most recent): Lab Results  Component Value Date   HGBA1C 8.5 (H) 12/30/2015   HGBA1C CANCELED 09/26/2015   HGBA1C 8.4 (H) 09/26/2015     Assessment & Plan:   1. Type 2 diabetes mellitus with stage 2 chronic kidney disease, with  long-term current use of insulin (HCC)  - her diabetes is  complicated by stable CKD ( which is improving) and patient remains at a high risk for more acute and chronic complications of diabetes which include CAD, CVA, CKD, retinopathy, and neuropathy. These are all discussed in detail with the patient.  Patient came with above target glucose profile, and  recent A1c 8.5%  Remains stable from last visit, generally improving from 10% . - Glucose logs and insulin administration records pertaining to this visit,  to be scanned into patient's records.  Recent labs reviewed.   - I have re-counseled the patient on diet management and weight loss  by adopting a carbohydrate restricted / protein rich  Diet.  - Suggestion is made for patient to avoid simple carbohydrates   from their diet including Cakes , Desserts, Ice Cream,  Soda (  diet and regular) , Sweet Tea , Candies,  Chips, Cookies, Artificial Sweeteners,   and "Sugar-free" Products .  This will help patient to have stable blood glucose profile and potentially avoid unintended  Weight gain.  - Patient is advised to stick to a routine mealtimes to eat 3 meals  a day and avoid unnecessary snacks ( to snack only to correct hypoglycemia).  - The patient  declined referral to a  CDE for individualized DM education.  - I have approached patient with the following individualized plan to manage diabetes and patient agrees.   -  I will increase Humalog 75/25   30 units with breakfast and 26 units with supper associated with associated with monitoring of BG AC and HS.   -Adjustment parameters for hypo and hyperglycemia were given in a written document to patient. -Patient is encouraged to call clinic for blood glucose levels less than 70 or above 200 mg /dl. -She could not afford Invokana. -Her labs show her renal function is improving. She would have benefited from metformin however she reports intolerance to metformin.   - Patient specific target   for A1c; LDL, HDL, Triglycerides, and  Waist Circumference were discussed in detail.  2) BP/HTN: Controlled. Continue current medications including ACEI/ARB. 3) Lipids/HPL:  continue statins. 4)  Weight/Diet: CDE consult in progress, exercise, and carbohydrates information provided.  5) Chronic Care/Health Maintenance:  -Patient is on ACEI/ARB and Statin medications and encouraged to continue to follow up with Ophthalmology, Podiatrist at least yearly or according to recommendations, and advised to  stay away from smoking. I have recommended yearly flu vaccine and pneumonia vaccination at least every 5 years; moderate intensity exercise for up to 150 minutes weekly; and  sleep for  at least 7 hours a day.  - 25 minutes of time was spent on the care of this patient , 50% of which was applied for counseling on diabetes complications and their preventions.  - I advised patient to maintain close follow up with Colette Ribas, MD for primary care needs.  Patient is asked to bring meter and  blood glucose logs during their next visit.   Follow up plan: -Return in about 3 months (around 04/05/2016) for follow up with pre-visit labs, meter, and logs.  Marquis Lunch, MD Phone: [phone]  Fax: [phone]   01/06/2016, 9:20 AM

## 2016-01-16 ENCOUNTER — Encounter: Payer: Self-pay | Admitting: *Deleted

## 2016-01-16 NOTE — Progress Notes (Signed)
SURGICAL PRE AUTHORIZATION FOR CPT [phone] NOT REQUIRED PER [employer] RE[facility], 01/16/16 3:21PM

## 2016-01-16 NOTE — Patient Instructions (Signed)
Diana Campbell  01/16/2016     @PREFPERIOPPHARMACY @   Your procedure is scheduled on  01/23/2016   Report to [facility] at  840  A.M.  Call this number if you have problems the morning of surgery:  [phone]   Remember:  Do not eat food or drink liquids after midnight.  Take these medicines the morning of surgery with A SIP OF WATER  Coreg, lisinopril. Take 1/2 of your usual insulin dosage the night before your surgery. DO NOT take any medications for diabetes the morning of surgery.   Do not wear jewelry, make-up or nail polish.  Do not wear lotions, powders, or perfumes, or deoderant.  Do not shave 48 hours prior to surgery.  Men may shave face and neck.  Do not bring valuables to the hospital.  [facility] is not responsible for any belongings or valuables.  Contacts, dentures or bridgework may not be worn into surgery.  Leave your suitcase in the car.  After surgery it may be brought to your room.  For patients admitted to the hospital, discharge time will be determined by your treatment team.  Patients discharged the day of surgery will not be allowed to drive home.   Name and phone number of your driver:   family Special instructions:  none  Please read over the following fact sheets that you were given. Anesthesia Post-op Instructions and Care and Recovery After Surgery       Carpal Tunnel Release Carpal tunnel release is a surgical procedure to relieve numbness and pain in your hand that are caused by carpal tunnel syndrome. Your carpal tunnel is a narrow, hollow space in your wrist. It passes between your wrist bones and a band of connective tissue (transverse carpal ligament). The nerve that supplies most of your hand (median nerve) passes through this space, and so do the connections between your fingers and the muscles of your arm (tendons). Carpal tunnel syndrome makes this space swell and become narrow, and this causes pain and numbness. In  carpal tunnel release surgery, a surgeon cuts through the transverse carpal ligament to make more room in the carpal tunnel space. You may have this surgery if other types of treatment have not worked. Tell a health care provider about:  Any allergies you have.  All medicines you are taking, including vitamins, herbs, eye drops, creams, and over-the-counter medicines.  Any problems you or family members have had with anesthetic medicines.  Any blood disorders you have.  Any surgeries you have had.  Any medical conditions you have. What are the risks? Generally, this is a safe procedure. However, problems may occur, including:  Bleeding.  Infection.  Injury to the median nerve.  Need for additional surgery. What happens before the procedure?  Ask your health care provider about:  Changing or stopping your regular medicines. This is especially important if you are taking diabetes medicines or blood thinners.  Taking medicines such as aspirin and ibuprofen. These medicines can thin your blood. Do not take these medicines before your procedure if your health care provider instructs you not to.  Do not eat or drink anything after midnight on the night before the procedure or as directed by your health care provider.  Plan to have someone take you home after the procedure. What happens during the procedure?  An IV tube may be inserted into a vein.  You will be given one of the following:  A medicine that numbs the wrist area (local anesthetic). You may also be given a medicine to make you relax (sedative).  A medicine that makes you go to sleep (general anesthetic).  Your arm, hand, and wrist will be cleaned with a germ-killing solution (antiseptic).  Your surgeon will make a surgical cut (incision) over the palm side of your wrist. The surgeon will pull aside the skin of your wrist to expose the carpal tunnel space.  The surgeon will cut the transverse carpal  ligament.  The edges of the incision will be closed with stitches (sutures) or staples.  A bandage (dressing) will be placed over your wrist and wrapped around your hand and wrist. What happens after the procedure?  You may spend some time in a recovery area.  Your blood pressure, heart rate, breathing rate, and blood oxygen level will be monitored often until the medicines you were given have worn off.  You will likely have some pain. You will be given pain medicine.  You may need to wear a splint or a wrist brace over your dressing. This information is not intended to replace advice given to you by your health care provider. Make sure you discuss any questions you have with your health care provider. Document Released: 03/28/2003 Document Revised: 06/13/2015 Document Reviewed: 08/23/2013 Elsevier Interactive Patient Education  2017 [employer]. Carpal Tunnel Release, Care After Refer to this sheet in the next few weeks. These instructions provide you with information about caring for yourself after your procedure. Your health care provider may also give you more specific instructions. Your treatment has been planned according to current medical practices, but problems sometimes occur. Call your health care provider if you have any problems or questions after your procedure. What can I expect after the procedure? After your procedure, it is typical to have the following:  Pain.  Numbness.  Tingling.  Swelling.  Stiffness.  Bruising. Follow these instructions at home:  Take medicines only as directed by your health care provider.  There are many different ways to close and cover an incision, including stitches (sutures), skin glue, and adhesive strips. Follow your health care provider's instructions about:  Incision care.  Bandage (dressing) changes and removal.  Incision closure removal.  Wear a splint or a brace as directed by your surgeon. You may need to do this for  2-3 weeks.  Keep your hand raised (elevated) above the level of your heart while you are resting. Move your fingers often.  Avoid activities that cause hand pain.  Ask your surgeon when you can start to do all of your usual activities again, such as:  Driving.  Returning to work.  Bathing and swimming.  Keep all follow-up visits as directed by your health care provider. This is important. You may need physical therapy for several months to speed healing and regain movement. Contact a health care provider if:  You have drainage, redness, swelling, or pain at your incision site.  You have a fever.  You have chills.  Your pain medicine is not working.  Your symptoms do not go away after 2 months.  Your symptoms go away and then return. Get help right away if:  You have pain or numbness that is getting worse.  Your fingers change color.  You are not able to move your fingers. This information is not intended to replace advice given to you by your health care provider. Make sure you discuss any questions you have with your health care  provider. Document Released: 07/25/2004 Document Revised: 06/13/2015 Document Reviewed: 08/23/2013 Elsevier Interactive Patient Education  2017 Elsevier Inc. PATIENT INSTRUCTIONS POST-ANESTHESIA  IMMEDIATELY FOLLOWING SURGERY:  Do not drive or operate machinery for the first twenty four hours after surgery.  Do not make any important decisions for twenty four hours after surgery or while taking narcotic pain medications or sedatives.  If you develop intractable nausea and vomiting or a severe headache please notify your doctor immediately.  FOLLOW-UP:  Please make an appointment with your surgeon as instructed. You do not need to follow up with anesthesia unless specifically instructed to do so.  WOUND CARE INSTRUCTIONS (if applicable):  Keep a dry clean dressing on the anesthesia/puncture wound site if there is drainage.  Once the wound has  quit draining you may leave it open to air.  Generally you should leave the bandage intact for twenty four hours unless there is drainage.  If the epidural site drains for more than 36-48 hours please call the anesthesia department.  QUESTIONS?:  Please feel free to call your physician or the hospital operator if you have any questions, and they will be happy to assist you.

## 2016-01-21 ENCOUNTER — Encounter (HOSPITAL_COMMUNITY): Payer: Self-pay

## 2016-01-21 ENCOUNTER — Encounter (HOSPITAL_COMMUNITY)
Admission: RE | Admit: 2016-01-21 | Discharge: 2016-01-21 | Disposition: A | Discharge status: Home / Self Care | Payer: Medicare Other | Source: Ambulatory Visit | Attending: Orthopedic Surgery | Admitting: Orthopedic Surgery

## 2016-01-21 DIAGNOSIS — I10 Essential (primary) hypertension: Secondary | ICD-10-CM | POA: Diagnosis not present

## 2016-01-21 DIAGNOSIS — I2510 Atherosclerotic heart disease of native coronary artery without angina pectoris: Secondary | ICD-10-CM | POA: Diagnosis not present

## 2016-01-21 DIAGNOSIS — E119 Type 2 diabetes mellitus without complications: Secondary | ICD-10-CM | POA: Diagnosis not present

## 2016-01-21 DIAGNOSIS — G5601 Carpal tunnel syndrome, right upper limb: Secondary | ICD-10-CM | POA: Diagnosis not present

## 2016-01-21 DIAGNOSIS — G4730 Sleep apnea, unspecified: Secondary | ICD-10-CM | POA: Diagnosis not present

## 2016-01-21 DIAGNOSIS — Z794 Long term (current) use of insulin: Secondary | ICD-10-CM | POA: Diagnosis not present

## 2016-01-21 DIAGNOSIS — F17290 Nicotine dependence, other tobacco product, uncomplicated: Secondary | ICD-10-CM | POA: Diagnosis not present

## 2016-01-21 DIAGNOSIS — Z95810 Presence of automatic (implantable) cardiac defibrillator: Secondary | ICD-10-CM | POA: Diagnosis not present

## 2016-01-21 DIAGNOSIS — M25531 Pain in right wrist: Secondary | ICD-10-CM | POA: Diagnosis present

## 2016-01-21 HISTORY — DX: Polyneuropathy, unspecified: G62.9

## 2016-01-21 HISTORY — DX: Unspecified osteoarthritis, unspecified site: M19.90

## 2016-01-21 NOTE — Pre-Procedure Instructions (Signed)
Patient has defibilator. Contacted Cecile Sheerer, OR Specialty coordinator and she feels device needs to be turn off because cautery is used with this procedure at times. Called [facility] and spoke with Aram Beecham who states to fax form with attention to Dr Loney Hering and she will have him fill out form and return before patient's surgery on 01/23/2016. Called medtronic rep Leta Jungling and let her know date and time of procedure.

## 2016-01-21 NOTE — Progress Notes (Signed)
   01/21/16 1108  OBSTRUCTIVE SLEEP APNEA  Have you ever been diagnosed with sleep apnea through a sleep study? No  Do you snore loudly (loud enough to be heard through closed doors)?  1  Do you often feel tired, fatigued, or sleepy during the daytime (such as falling asleep during driving or talking to someone)? 0  Has anyone observed you stop breathing during your sleep? 1  Do you have, or are you being treated for high blood pressure? 1  BMI more than 35 kg/m2? 0  Age > 50 (1-yes) 1  Neck circumference greater than:Female 16 inches or larger, Female 17inches or larger? 0  Female Gender (Yes=1) 0  Obstructive Sleep Apnea Score 4  Score 5 or greater  Results sent to PCP

## 2016-01-22 NOTE — Pre-Procedure Instructions (Signed)
Patient called to update time of arrival to 0815 since we need to redraw labs. She verbalized understanding.

## 2016-01-22 NOTE — H&P (Signed)
Diana Campbell is an 71 y.o. female.   Chief Complaint: Right upper extremity pain paresthesias numbness and tingling HPI: This 71 year old female has been treated for carpal tunnel syndrome of the right upper extremity with appropriate splinting and gabapentin which did not relates her symptoms of numbness tingling pain and paresthesias especially at night and she presents for carpal tunnel release of the right wrist  Past Medical History:  Diagnosis Date  . Arthritis   . Automatic implantable cardioverter-defibrillator in situ 07/2010  . Cardiac abnormality    with defibrilator  . Chronic combined systolic and diastolic CHF (congestive heart failure) (HCC) 09/04/2015  . Diabetes mellitus   . H/O cardiac arrest 2006  . Hypercholesteremia   . Hypertension   . Neuropathy [facility])     Past Surgical History:  Procedure Laterality Date  . 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  Marland Kitchen CARDIAC CATHETERIZATION  04/14/2000   Normal coronary arteries.  Marland Kitchen CARDIAC DEFIBRILLATOR PLACEMENT  07/21/2010   Medtronic Protecta XT DR, model#D314DRG, serial#[id number]  . CARDIOVASCULAR STRESS TEST  04/05/2000   Scintigraphic evidence of mild LV dilatation and global LV dysfunction with an EF 41% with mild inferolateral ischemia.  . COLONOSCOPY  03/30/2011   Procedure: COLONOSCOPY;  Surgeon: Dalia Heading, MD;  Location: [facility];  Service: Gastroenterology;  Laterality: N/A;  . DORSAL COMPARTMENT RELEASE Right 08/11/2013   Procedure: RELEASE DORSAL COMPARTMENT (DEQUERVAIN);  Surgeon: Vickki Hearing, MD;  Location: [facility] ORS;  Service: Orthopedics;  Laterality: Right;  . TRANSTHORACIC ECHOCARDIOGRAM  08/03/2011   EF 45-50%, mildly reduced LV systolic function.    Family history did not contribute any information such as heart disease diabetes anesthesia  wrist   Social History:  reports that she has never smoked. Her smokeless tobacco use includes Snuff. She reports that she does not drink alcohol or use drugs.  Allergies: No Known Allergies  No prescriptions prior to admission.    No results found for this or any previous visit (from the past 48 hour(s)). No results found.  Review of Systems  Constitutional: Negative for fever.  HENT: Negative for hearing loss.   Eyes: Negative for blurred vision.  Respiratory: Negative for cough and shortness of breath.   Cardiovascular: Negative for chest pain.  Gastrointestinal: Negative for heartburn.  Genitourinary: Negative for dysuria.  Musculoskeletal: Negative for myalgias.  Skin: Negative for rash.  Neurological: Negative for dizziness.  Endo/Heme/Allergies: Does not bruise/bleed easily.  Psychiatric/Behavioral: Negative for depression.    There were no vitals taken for this visit. Physical Exam  Constitutional: She is oriented to person, place, and time. She appears well-nourished.  Eyes: Right eye exhibits no discharge. Left eye exhibits no discharge. No scleral icterus.  Neck: Neck supple. No JVD present. No tracheal deviation present.  Cardiovascular: Intact distal pulses.   Respiratory: Effort normal. No stridor.  GI: Soft. She exhibits no distension.  Neurological: She is alert and oriented to person, place, and time. She has normal reflexes. She exhibits normal muscle tone. Coordination normal.  Skin: Skin is warm and dry. No rash noted. No erythema. No pallor.  Psychiatric: She has a normal mood and affect. Her behavior is normal. Thought content normal.    Evaluation of ts tenderness over the carpal tunnel full range of motion of the hand and wrist. The hand and wrist are stable in all joints. She has  slight weakened grip strength when compared to the left. Skin is intact she has sensory loss in the median nerve distribution ulnar nerve normal radial nerve  normal  Provocative carpal tunnel testing reveals normal Tinel's buta positive compression test and wrist flexion test  Assessment/Plan Right carpal tunnel syndrome  Right carpal tunnel release  [name], MD 01/22/2016, 12:59 PM

## 2016-01-23 ENCOUNTER — Ambulatory Visit (HOSPITAL_COMMUNITY): Payer: Medicare Other | Admitting: Anesthesiology

## 2016-01-23 ENCOUNTER — Encounter (HOSPITAL_COMMUNITY)
Admission: RE | Disposition: A | Discharge status: Home / Self Care | Payer: Self-pay | Source: Ambulatory Visit | Attending: Orthopedic Surgery

## 2016-01-23 ENCOUNTER — Ambulatory Visit (HOSPITAL_COMMUNITY)
Admission: RE | Admit: 2016-01-23 | Discharge: 2016-01-23 | Disposition: A | Discharge status: Home / Self Care | Payer: Medicare Other | Source: Ambulatory Visit | Attending: Orthopedic Surgery | Admitting: Orthopedic Surgery

## 2016-01-23 DIAGNOSIS — Z794 Long term (current) use of insulin: Secondary | ICD-10-CM | POA: Insufficient documentation

## 2016-01-23 DIAGNOSIS — F17290 Nicotine dependence, other tobacco product, uncomplicated: Secondary | ICD-10-CM | POA: Insufficient documentation

## 2016-01-23 DIAGNOSIS — E119 Type 2 diabetes mellitus without complications: Secondary | ICD-10-CM | POA: Insufficient documentation

## 2016-01-23 DIAGNOSIS — G5601 Carpal tunnel syndrome, right upper limb: Secondary | ICD-10-CM | POA: Insufficient documentation

## 2016-01-23 DIAGNOSIS — I10 Essential (primary) hypertension: Secondary | ICD-10-CM | POA: Diagnosis not present

## 2016-01-23 DIAGNOSIS — Z95810 Presence of automatic (implantable) cardiac defibrillator: Secondary | ICD-10-CM | POA: Insufficient documentation

## 2016-01-23 DIAGNOSIS — G4730 Sleep apnea, unspecified: Secondary | ICD-10-CM | POA: Insufficient documentation

## 2016-01-23 DIAGNOSIS — I2510 Atherosclerotic heart disease of native coronary artery without angina pectoris: Secondary | ICD-10-CM | POA: Insufficient documentation

## 2016-01-23 HISTORY — PX: CARPAL TUNNEL RELEASE: SHX101

## 2016-01-23 LAB — CBC WITH DIFFERENTIAL/PLATELET
BASOS ABS: 0.1 10*3/uL (ref 0.0–0.1)
Basophils Relative: 1 %
EOS PCT: 2 %
Eosinophils Absolute: 0.2 10*3/uL (ref 0.0–0.7)
HCT: 38.6 % (ref 36.0–46.0)
Hemoglobin: 12.3 g/dL (ref 12.0–15.0)
LYMPHS PCT: 38 %
Lymphs Abs: 3.8 10*3/uL (ref 0.7–4.0)
MCH: 25.5 pg — AB (ref 26.0–34.0)
MCHC: 31.9 g/dL (ref 30.0–36.0)
MCV: 80.1 fL (ref 78.0–100.0)
MONO ABS: 0.9 10*3/uL (ref 0.1–1.0)
MONOS PCT: 8 %
Neutro Abs: 5.2 10*3/uL (ref 1.7–7.7)
Neutrophils Relative %: 51 %
Platelets: 261 10*3/uL (ref 150–400)
RBC: 4.82 MIL/uL (ref 3.87–5.11)
RDW: 14.6 % (ref 11.5–15.5)
WBC: 10.2 10*3/uL (ref 4.0–10.5)

## 2016-01-23 LAB — BASIC METABOLIC PANEL
ANION GAP: 6 (ref 5–15)
BUN: 19 mg/dL (ref 6–20)
CALCIUM: 9.5 mg/dL (ref 8.9–10.3)
CO2: 28 mmol/L (ref 22–32)
CREATININE: 0.87 mg/dL (ref 0.44–1.00)
Chloride: 105 mmol/L (ref 101–111)
GFR calc Af Amer: >60 mL/min (ref >60)
GLUCOSE: 158 mg/dL — AB (ref 65–99)
Potassium: 4 mmol/L (ref 3.5–5.1)
Sodium: 139 mmol/L (ref 135–145)

## 2016-01-23 LAB — GLUCOSE, CAPILLARY: Glucose-Capillary: 153 mg/dL — ABNORMAL HIGH (ref 65–99)

## 2016-01-23 SURGERY — CARPAL TUNNEL RELEASE
Anesthesia: Monitor Anesthesia Care | Site: Hand | Laterality: Right

## 2016-01-23 MED ORDER — MIDAZOLAM HCL 2 MG/2ML IJ SOLN
1.0000 mg | INTRAMUSCULAR | Status: DC | PRN
  Administered 2016-01-23: 2 mg via INTRAVENOUS
  Filled 2016-01-23: qty 2

## 2016-01-23 MED ORDER — MIDAZOLAM HCL 5 MG/5ML IJ SOLN
INTRAMUSCULAR | Status: DC | PRN
  Administered 2016-01-23: 1 mg via INTRAVENOUS

## 2016-01-23 MED ORDER — MIDAZOLAM HCL 2 MG/2ML IJ SOLN
INTRAMUSCULAR | Status: AC
  Filled 2016-01-23: qty 2

## 2016-01-23 MED ORDER — FENTANYL CITRATE (PF) 100 MCG/2ML IJ SOLN
INTRAMUSCULAR | Status: DC | PRN
  Administered 2016-01-23: 25 ug via INTRAVENOUS

## 2016-01-23 MED ORDER — FENTANYL CITRATE (PF) 100 MCG/2ML IJ SOLN: 25.0000 ug | INTRAMUSCULAR | Status: DC | PRN

## 2016-01-23 MED ORDER — PROPOFOL 500 MG/50ML IV EMUL
INTRAVENOUS | Status: DC | PRN
  Administered 2016-01-23: 25 ug/kg/min via INTRAVENOUS

## 2016-01-23 MED ORDER — CHLORHEXIDINE GLUCONATE 4 % EX LIQD: 60.0000 mL | Freq: Once | CUTANEOUS | Status: DC

## 2016-01-23 MED ORDER — BUPIVACAINE HCL (PF) 0.5 % IJ SOLN
INTRAMUSCULAR | Status: DC | PRN
  Administered 2016-01-23: 20 mL

## 2016-01-23 MED ORDER — ACETAMINOPHEN-CODEINE #3 300-30 MG PO TABS: 1.0000 | ORAL_TABLET | Freq: Four times a day (QID) | ORAL | 0 refills | Status: DC | PRN

## 2016-01-23 MED ORDER — PROPOFOL 10 MG/ML IV BOLUS
INTRAVENOUS | Status: AC
  Filled 2016-01-23: qty 40

## 2016-01-23 MED ORDER — LIDOCAINE HCL (PF) 0.5 % IJ SOLN
INTRAMUSCULAR | Status: DC | PRN
  Administered 2016-01-23: 250 mg via INTRAVENOUS

## 2016-01-23 MED ORDER — FENTANYL CITRATE (PF) 100 MCG/2ML IJ SOLN
INTRAMUSCULAR | Status: AC
  Filled 2016-01-23: qty 2

## 2016-01-23 MED ORDER — BUPIVACAINE HCL (PF) 0.5 % IJ SOLN
INTRAMUSCULAR | Status: AC
  Filled 2016-01-23: qty 30

## 2016-01-23 MED ORDER — 0.9 % SODIUM CHLORIDE (POUR BTL) OPTIME
TOPICAL | Status: DC | PRN
  Administered 2016-01-23: 1000 mL

## 2016-01-23 MED ORDER — LACTATED RINGERS IV SOLN
INTRAVENOUS | Status: DC
  Administered 2016-01-23: 1000 mL via INTRAVENOUS

## 2016-01-23 MED ORDER — FENTANYL CITRATE (PF) 100 MCG/2ML IJ SOLN
25.0000 ug | INTRAMUSCULAR | Status: DC | PRN
  Administered 2016-01-23: 25 ug via INTRAVENOUS
  Filled 2016-01-23: qty 2

## 2016-01-23 MED ORDER — CEFAZOLIN SODIUM-DEXTROSE 2-4 GM/100ML-% IV SOLN
2.0000 g | INTRAVENOUS | Status: AC
  Administered 2016-01-23: 2 g via INTRAVENOUS
  Filled 2016-01-23: qty 100

## 2016-01-23 SURGICAL SUPPLY — 37 items
BAG HAMPER (MISCELLANEOUS) ×3 IMPLANT
BANDAGE ELASTIC 3 LF NS (GAUZE/BANDAGES/DRESSINGS) ×3 IMPLANT
BANDAGE ESMARK 4X12 BL STRL LF (DISPOSABLE) ×1 IMPLANT
BLADE SURG 15 STRL LF DISP TIS (BLADE) ×1 IMPLANT
BLADE SURG 15 STRL SS (BLADE) ×2
BNDG COHESIVE 4X5 TAN STRL (GAUZE/BANDAGES/DRESSINGS) ×3 IMPLANT
BNDG ESMARK 4X12 BLUE STRL LF (DISPOSABLE) ×3
BNDG GAUZE ELAST 4 BULKY (GAUZE/BANDAGES/DRESSINGS) ×3 IMPLANT
CHLORAPREP W/TINT 26ML (MISCELLANEOUS) ×3 IMPLANT
CLOTH BEACON ORANGE TIMEOUT ST (SAFETY) ×3 IMPLANT
COVER LIGHT HANDLE STERIS (MISCELLANEOUS) ×6 IMPLANT
CUFF TOURNIQUET SINGLE 18IN (TOURNIQUET CUFF) ×3 IMPLANT
DECANTER SPIKE VIAL GLASS SM (MISCELLANEOUS) ×3 IMPLANT
DRAPE PROXIMA HALF (DRAPES) ×3 IMPLANT
DRSG XEROFORM 1X8 (GAUZE/BANDAGES/DRESSINGS) ×3 IMPLANT
ELECT NEEDLE TIP 2.8 STRL (NEEDLE) ×3 IMPLANT
ELECT REM PT RETURN 9FT ADLT (ELECTROSURGICAL) ×3
ELECTRODE REM PT RTRN 9FT ADLT (ELECTROSURGICAL) ×1 IMPLANT
GAUZE SPONGE 4X4 12PLY STRL (GAUZE/BANDAGES/DRESSINGS) ×3 IMPLANT
GLOVE BIOGEL PI IND STRL 7.0 (GLOVE) ×1 IMPLANT
GLOVE BIOGEL PI INDICATOR 7.0 (GLOVE) ×2
GLOVE SKINSENSE NS SZ8.0 LF (GLOVE) ×2
GLOVE SKINSENSE STRL SZ8.0 LF (GLOVE) ×1 IMPLANT
GLOVE SS N UNI LF 8.5 STRL (GLOVE) ×3 IMPLANT
GOWN STRL REUS W/ TWL LRG LVL3 (GOWN DISPOSABLE) ×1 IMPLANT
GOWN STRL REUS W/TWL LRG LVL3 (GOWN DISPOSABLE) ×2
GOWN STRL REUS W/TWL XL LVL3 (GOWN DISPOSABLE) ×3 IMPLANT
HAND ALUMI XLG (SOFTGOODS) ×3 IMPLANT
KIT ROOM TURNOVER APOR (KITS) ×3 IMPLANT
MANIFOLD NEPTUNE II (INSTRUMENTS) ×3 IMPLANT
NEEDLE HYPO 21X1.5 SAFETY (NEEDLE) ×3 IMPLANT
NS IRRIG 1000ML POUR BTL (IV SOLUTION) ×3 IMPLANT
PACK BASIC LIMB (CUSTOM PROCEDURE TRAY) ×3 IMPLANT
PAD ARMBOARD 7.5X6 YLW CONV (MISCELLANEOUS) ×3 IMPLANT
SET BASIN LINEN APH (SET/KITS/TRAYS/PACK) ×3 IMPLANT
SUT ETHILON 3 0 FSL (SUTURE) ×3 IMPLANT
SYR CONTROL 10ML LL (SYRINGE) ×3 IMPLANT

## 2016-01-23 NOTE — Discharge Instructions (Signed)
Carpal Tunnel Release, Care After Refer to this sheet in the next few weeks. These instructions provide you with information about caring for yourself after your procedure. Your health care provider may also give you more specific instructions. Your treatment has been planned according to current medical practices, but problems sometimes occur. Call your health care provider if you have any problems or questions after your procedure. What can I expect after the procedure? After your procedure, it is typical to have the following:  Pain.  Numbness.  Tingling.  Swelling.  Stiffness.  Bruising. Follow these instructions at home:  Take medicines only as directed by your health care provider.  There are many different ways to close and cover an incision, including stitches (sutures), skin glue, and adhesive strips. Follow your health care provider's instructions about:  Incision care.  Bandage (dressing) changes and removal.  Incision closure removal.  Wear a splint or a brace as directed by your surgeon. You may need to do this for 2-3 weeks.  Keep your hand raised (elevated) above the level of your heart while you are resting. Move your fingers often.  Avoid activities that cause hand pain.  Ask your surgeon when you can start to do all of your usual activities again, such as:  Driving.  Returning to work.  Bathing and swimming.  Keep all follow-up visits as directed by your health care provider. This is important. You may need physical therapy for several months to speed healing and regain movement. Contact a health care provider if:  You have drainage, redness, swelling, or pain at your incision site.  You have a fever.  You have chills.  Your pain medicine is not working.  Your symptoms do not go away after 2 months.  Your symptoms go away and then return. Get help right away if:  You have pain or numbness that is getting worse.  Your fingers change  color.  You are not able to move your fingers. This information is not intended to replace advice given to you by your health care provider. Make sure you discuss any questions you have with your health care provider. Document Released: 07/25/2004 Document Revised: 06/13/2015 Document Reviewed: 08/23/2013 Elsevier Interactive Patient Education  2017 Elsevier Inc.  Anesthesia, Adult, Care After These instructions provide you with information about caring for yourself after your procedure. Your health care provider may also give you more specific instructions. Your treatment has been planned according to current medical practices, but problems sometimes occur. Call your health care provider if you have any problems or questions after your procedure. What can I expect after the procedure? After the procedure, it is common to have:  Vomiting.  A sore throat.  Mental slowness. It is common to feel:  Nauseous.  Cold or shivery.  Sleepy.  Tired.  Sore or achy, even in parts of your body where you did not have surgery. Follow these instructions at home: For at least 24 hours after the procedure:  Do not:  Participate in activities where you could fall or become injured.  Drive.  Use heavy machinery.  Drink alcohol.  Take sleeping pills or medicines that cause drowsiness.  Make important decisions or sign legal documents.  Take care of children on your own.  Rest. Eating and drinking  If you vomit, drink water, juice, or soup when you can drink without vomiting.  Drink enough fluid to keep your urine clear or pale yellow.  Make sure you have little or no nausea  before eating solid foods.  Follow the diet recommended by your health care provider. General instructions  Have a responsible adult stay with you until you are awake and alert.  Return to your normal activities as told by your health care provider. Ask your health care provider what activities are safe for  you.  Take over-the-counter and prescription medicines only as told by your health care provider.  If you smoke, do not smoke without supervision.  Keep all follow-up visits as told by your health care provider. This is important. Contact a health care provider if:  You continue to have nausea or vomiting at home, and medicines are not helpful.  You cannot drink fluids or start eating again.  You cannot urinate after 8-12 hours.  You develop a skin rash.  You have fever.  You have increasing redness at the site of your procedure. Get help right away if:  You have difficulty breathing.  You have chest pain.  You have unexpected bleeding.  You feel that you are having a life-threatening or urgent problem. This information is not intended to replace advice given to you by your health care provider. Make sure you discuss any questions you have with your health care provider. Document Released: 04/13/2000 Document Revised: 06/10/2015 Document Reviewed: 12/20/2014 Elsevier Interactive Patient Education  2017 [employer].

## 2016-01-23 NOTE — Brief Op Note (Signed)
01/23/2016  8:18 AM  PATIENT:  Diana Campbell  71 y.o. female  PRE-OPERATIVE DIAGNOSIS:  right carpal tunnel syndrome  POST-OPERATIVE DIAGNOSIS:  right carpal tunnel syndrome  PROCEDURE:  Procedure(s) with comments: CARPAL TUNNEL RELEASE (Right) - pt knows to arrive at 6:15  SURGEON:  Surgeon(s) and Role:    * Vickki Hearing, MD - Primary  PHYSICIAN ASSISTANT:   ASSISTANTS: none   ANESTHESIA:   regional  EBL:  Total I/O In: 400 [I.V.:400] Out: 0   BLOOD ADMINISTERED:none  DRAINS: none   LOCAL MEDICATIONS USED:  LIDOCAINE  and Amount: 20 ml  SPECIMEN:  No Specimen  DISPOSITION OF SPECIMEN:  N/A  COUNTS:  YES  TOURNIQUET:  * Missing tourniquet times found for documented tourniquets in log:  [id number] *  DICTATION: .Dragon Dictation  PLAN OF CARE: Discharge to home after PACU  PATIENT DISPOSITION:  PACU - hemodynamically stable.   Delay start of Pharmacological VTE agent (>24hrs) due to surgical blood loss or risk of bleeding: not applicable

## 2016-01-23 NOTE — Anesthesia Procedure Notes (Signed)
Anesthesia Regional Block:  Bier block (IV Regional)  Pre-Anesthetic Checklist: ,, timeout performed, Correct Patient, Correct Site, Correct Laterality, Correct Procedure,, site marked, surgical consent, pre-op evaluation, at surgeon's request  Laterality: Right     Needles:  Injection technique: Single-shot  Needle Type: Other      Needle Gauge: 20 and 20 G    Additional Needles: Bier block (IV Regional)  Nerve Stimulator or Paresthesia:   Additional Responses:  Pulse checked post tourniquet inflation. IV NSL discontinued post injection. Narrative:  Start time: 01/23/2016 7:47 AM  Performed by: Personally

## 2016-01-23 NOTE — Transfer of Care (Signed)
Immediate Anesthesia Transfer of Care Note  Patient: Diana Campbell  Procedure(s) Performed: Procedure(s) with comments: CARPAL TUNNEL RELEASE (Right) - pt knows to arrive at 6:15  Patient Location: PACU  Anesthesia Type:Bier block  Level of Consciousness: awake and pateint uncooperative  Airway & Oxygen Therapy: Patient Spontanous Breathing and Patient connected to face mask oxygen  Post-op Assessment: Report given to RN, Post -op Vital signs reviewed and stable and Patient moving all extremities  Post vital signs: Reviewed and stable  Last Vitals:  Vitals:   01/23/16 0715 01/23/16 0730  BP: (!) 162/72 (!) 177/75  Resp: 16 20  Temp:      Last Pain:  Vitals:   01/23/16 0652  TempSrc: Oral      Patients Stated Pain Goal: 8 (01/23/16 7989)  Complications: No apparent anesthesia complications

## 2016-01-23 NOTE — Anesthesia Preprocedure Evaluation (Signed)
Anesthesia Evaluation  Patient identified by MRN, date of birth, ID band Patient awake    Reviewed: Allergy & Precautions, H&P , NPO status , Patient's Chart, lab work & pertinent test results, reviewed documented beta blocker date and time   Airway Mallampati: III  TM Distance: >3 FB Neck ROM: Full    Dental  (+) Teeth Intact, Implants,    Pulmonary sleep apnea ,    breath sounds clear to auscultation       Cardiovascular hypertension, Pt. on medications and Pt. on home beta blockers + CAD and +CHF  + Cardiac Defibrillator  Rhythm:Regular Rate:Normal     Neuro/Psych    GI/Hepatic neg GERD  ,  Endo/Other  diabetes, Type 2, Insulin Dependent, Oral Hypoglycemic Agents  Renal/GU      Musculoskeletal   Abdominal   Peds  Hematology   Anesthesia Other Findings   Reproductive/Obstetrics                             Anesthesia Physical Anesthesia Plan  ASA: III  Anesthesia Plan: MAC and Bier Block   Post-op Pain Management:    Induction: Intravenous  Airway Management Planned: Nasal Cannula  Additional Equipment:   Intra-op Plan:   Post-operative Plan:   Informed Consent: I have reviewed the patients History and Physical, chart, labs and discussed the procedure including the risks, benefits and alternatives for the proposed anesthesia with the patient or authorized representative who has indicated his/her understanding and acceptance.     Plan Discussed with:   Anesthesia Plan Comments: (ICD )        Anesthesia Quick Evaluation

## 2016-01-23 NOTE — Anesthesia Postprocedure Evaluation (Signed)
Anesthesia Post Note  Patient: Diana Campbell  Procedure(s) Performed: Procedure(s) (LRB): CARPAL TUNNEL RELEASE (Right)  Patient location during evaluation: PACU Anesthesia Type: Regional Level of consciousness: awake, oriented and patient cooperative Pain management: pain level controlled Vital Signs Assessment: post-procedure vital signs reviewed and stable Respiratory status: spontaneous breathing, nonlabored ventilation and respiratory function stable Cardiovascular status: blood pressure returned to baseline Postop Assessment: no signs of nausea or vomiting Anesthetic complications: no     Last Vitals:  Vitals:   01/23/16 0730 01/23/16 0824  BP: (!) 177/75 (!) 161/80  Pulse:  61  Resp: 20 12  Temp:  36.6 C    Last Pain:  Vitals:   01/23/16 0652  TempSrc: Oral                 Audyn Dimercurio J

## 2016-01-23 NOTE — Op Note (Signed)
01/23/2016 8:19 AM [name], MD  Carpal tunnel release right wrist  Preop diagnosis carpal tunnel syndrome right wrist   postop diagnosis same  Procedure open carpal tunnel release right wrist  Surgeon Romeo Apple  Anesthesia regional Bier block  Operative findings compression of the right median nerveoccupied lesions  Indications failure of conservative treatment to relieve pain and paresthesias and numbness and tingling of the right hand.  The patient was identified in the preop area we confirm the surgical site marked as right wrist. Chart update completed. Patient taken to surgery. She had 2 g of Ancef. After establishing a Bier block her arm was prepped with ChloraPrep.  Timeout executed completed and confirmed site.  A straight incision was made over the carpal tunnel in line with the radial border of the ring finger. Blunt dissection was carried out to find the distal aspect of the carpal tunnel. A blunted judgment was passed beneath the carpal tunnel. Sharp incision was then used to release the transverse carpal ligament. The contents of the carpal tunnel were inspected. The median nerve was compressed with slight discoloration.  The wound was irrigated and then closed with 3-0 nylon suture. We injected 10 mL of plain Marcaine on the radial side of the incision  A sterile bandage was applied and the tourniquet was released the color of the hand and capillary refill were normal  The patient was taken to the recovery room in stable condition  [phone]

## 2016-01-24 ENCOUNTER — Encounter (HOSPITAL_COMMUNITY): Payer: Self-pay | Admitting: Orthopedic Surgery

## 2016-01-27 ENCOUNTER — Ambulatory Visit: Payer: Medicare Other | Admitting: Orthopedic Surgery

## 2016-01-28 ENCOUNTER — Encounter: Payer: Self-pay | Admitting: Orthopedic Surgery

## 2016-01-28 ENCOUNTER — Ambulatory Visit (INDEPENDENT_AMBULATORY_CARE_PROVIDER_SITE_OTHER): Payer: Self-pay | Admitting: Orthopedic Surgery

## 2016-01-28 DIAGNOSIS — Z9889 Other specified postprocedural states: Secondary | ICD-10-CM

## 2016-01-28 DIAGNOSIS — Z4889 Encounter for other specified surgical aftercare: Secondary | ICD-10-CM

## 2016-01-28 NOTE — Progress Notes (Signed)
Nakyla is status post RIGHT  carpal tunnel release   S:   Chief Complaint  Patient presents with  . Follow-up    POST OP 1, RT CTR, DOS 01/23/16   Doing well.   The patient complains of  No septic and pain but still has some nasal tingling at the tips of the fingers  Current pain medication: Not needed  The incision is clean dry and intact with no drainage The dressing was changed.  Patient to return for suture removal ~ POD 12-14

## 2016-01-31 NOTE — Addendum Note (Signed)
Encounter addended by: Novella Olive on: 01/31/2016 12:59 PM<BR>    Actions taken: Imaging Exam ended

## 2016-02-04 NOTE — Addendum Note (Signed)
Encounter addended by: Novella Olive on: 02/04/2016 11:45 AM<BR>    Actions taken: Imaging Exam ended, Charge Capture section accepted

## 2016-02-05 ENCOUNTER — Ambulatory Visit: Payer: Medicare Other | Admitting: Orthopedic Surgery

## 2016-02-07 ENCOUNTER — Encounter: Payer: Self-pay | Admitting: Orthopedic Surgery

## 2016-02-07 ENCOUNTER — Ambulatory Visit (INDEPENDENT_AMBULATORY_CARE_PROVIDER_SITE_OTHER): Payer: Medicare Other | Admitting: Orthopedic Surgery

## 2016-02-07 DIAGNOSIS — G5601 Carpal tunnel syndrome, right upper limb: Secondary | ICD-10-CM

## 2016-02-07 DIAGNOSIS — Z4889 Encounter for other specified surgical aftercare: Secondary | ICD-10-CM

## 2016-02-07 NOTE — Progress Notes (Signed)
Patient ID: Diana Campbell, female   DOB: 1945-04-03, 71 y.o.   MRN: [id number]  Follow up visit/ POST OP   Chief Complaint  Patient presents with  . Follow-up    POST OP, RT CARPAL TUNNEL RELEASE, DOS 01/23/16    Suture line is clean dry and intact without erythema patient reports less numbness and tingling and pain in the right hand   Encounter Diagnoses  Name Primary?  Marland Kitchen Aftercare following surgery Yes  . Carpal tunnel syndrome of right wrist     Recheck 4 weeks  9:20 AM [name], MD 02/07/2016

## 2016-03-03 ENCOUNTER — Ambulatory Visit (INDEPENDENT_AMBULATORY_CARE_PROVIDER_SITE_OTHER): Payer: Medicare Other | Admitting: *Deleted

## 2016-03-03 DIAGNOSIS — I428 Other cardiomyopathies: Secondary | ICD-10-CM

## 2016-03-03 NOTE — Progress Notes (Signed)
Remote ICD transmission.   

## 2016-03-04 ENCOUNTER — Encounter: Payer: Self-pay | Admitting: Cardiology

## 2016-03-05 LAB — CUP PACEART REMOTE DEVICE CHECK
Brady Statistic RA Percent Paced: 87.87 %
Date Time Interrogation Session: 20180213141318
HIGH POWER IMPEDANCE MEASURED VALUE: 361 Ohm
HighPow Impedance: 41 Ohm
HighPow Impedance: 47 Ohm
Implantable Lead Location: 753859
Implantable Lead Location: 753860
Implantable Lead Model: 5076
Lead Channel Impedance Value: 475 Ohm
Lead Channel Pacing Threshold Amplitude: 0.625 V
Lead Channel Pacing Threshold Amplitude: 0.75 V
Lead Channel Sensing Intrinsic Amplitude: 2.625 mV
Lead Channel Setting Pacing Amplitude: 2 V
Lead Channel Setting Pacing Pulse Width: 0.4 ms
MDC IDC LEAD IMPLANT DT: 20120702
MDC IDC LEAD IMPLANT DT: 20120702
MDC IDC MSMT BATTERY VOLTAGE: 2.85 V
MDC IDC MSMT LEADCHNL RA IMPEDANCE VALUE: 456 Ohm
MDC IDC MSMT LEADCHNL RA PACING THRESHOLD PULSEWIDTH: 0.4 ms
MDC IDC MSMT LEADCHNL RA SENSING INTR AMPL: 2.625 mV
MDC IDC MSMT LEADCHNL RV PACING THRESHOLD PULSEWIDTH: 0.4 ms
MDC IDC MSMT LEADCHNL RV SENSING INTR AMPL: 9.625 mV
MDC IDC MSMT LEADCHNL RV SENSING INTR AMPL: 9.625 mV
MDC IDC PG IMPLANT DT: 20120702
MDC IDC SET LEADCHNL RA PACING AMPLITUDE: 1.5 V
MDC IDC SET LEADCHNL RV SENSING SENSITIVITY: 0.3 mV
MDC IDC STAT BRADY AP VP PERCENT: 0.03 %
MDC IDC STAT BRADY AP VS PERCENT: 88.32 %
MDC IDC STAT BRADY AS VP PERCENT: 0.01 %
MDC IDC STAT BRADY AS VS PERCENT: 11.64 %
MDC IDC STAT BRADY RV PERCENT PACED: 0.04 %

## 2016-03-06 ENCOUNTER — Encounter: Payer: Self-pay | Admitting: Orthopedic Surgery

## 2016-03-06 ENCOUNTER — Ambulatory Visit (INDEPENDENT_AMBULATORY_CARE_PROVIDER_SITE_OTHER): Payer: Medicare Other | Admitting: Orthopedic Surgery

## 2016-03-06 DIAGNOSIS — G5601 Carpal tunnel syndrome, right upper limb: Secondary | ICD-10-CM

## 2016-03-06 DIAGNOSIS — Z4889 Encounter for other specified surgical aftercare: Secondary | ICD-10-CM

## 2016-03-06 NOTE — Progress Notes (Signed)
Patient ID: Diana Campbell, female   DOB: [birth year], 71 y.o.   MRN: [id number]  Follow up visit/ postop visit  Chief Complaint  Patient presents with  . Follow-up    POST OP RT CARPAL TUNNEL RELEASE, DOS 01/23/16    She has a little numbness at the fingertips and some stiffness but overall most of her symptoms have resolved she's not having any night pain she's regained her range of motion she is discharged   Encounter Diagnoses  Name Primary?  Marland Kitchen Aftercare following surgery Yes  . Carpal tunnel syndrome of right wrist       9:22 AM [name], MD 03/06/2016

## 2016-03-15 ENCOUNTER — Other Ambulatory Visit: Payer: Self-pay | Admitting: "Endocrinology

## 2016-03-16 ENCOUNTER — Other Ambulatory Visit: Payer: Self-pay | Admitting: "Endocrinology

## 2016-04-07 ENCOUNTER — Ambulatory Visit: Payer: Medicare Other | Admitting: "Endocrinology

## 2016-04-14 ENCOUNTER — Other Ambulatory Visit: Payer: Self-pay | Admitting: "Endocrinology

## 2016-04-14 LAB — MICROALBUMIN / CREATININE URINE RATIO
Creatinine, Urine: 112 mg/dL (ref 20–320)
Microalb Creat Ratio: 3 mcg/mg creat (ref <30)
Microalb, Ur: 0.3 mg/dL

## 2016-04-14 LAB — COMPREHENSIVE METABOLIC PANEL
ALBUMIN: 3.5 g/dL — AB (ref 3.6–5.1)
ALT: 11 U/L (ref 6–29)
AST: 13 U/L (ref 10–35)
Alkaline Phosphatase: 67 U/L (ref 33–130)
BUN: 14 mg/dL (ref 7–25)
CHLORIDE: 104 mmol/L (ref 98–110)
CO2: 27 mmol/L (ref 20–31)
Calcium: 9.2 mg/dL (ref 8.6–10.4)
Creat: 0.94 mg/dL — ABNORMAL HIGH (ref 0.60–0.93)
Glucose, Bld: 266 mg/dL — ABNORMAL HIGH (ref 65–99)
POTASSIUM: 4.2 mmol/L (ref 3.5–5.3)
Sodium: 139 mmol/L (ref 135–146)
Total Bilirubin: 0.4 mg/dL (ref 0.2–1.2)
Total Protein: 6.2 g/dL (ref 6.1–8.1)

## 2016-04-15 ENCOUNTER — Other Ambulatory Visit: Payer: Self-pay | Admitting: Cardiovascular Disease

## 2016-04-15 LAB — HEMOGLOBIN A1C
HEMOGLOBIN A1C: 9 % — AB (ref <5.7)
MEAN PLASMA GLUCOSE: 212 mg/dL

## 2016-04-15 NOTE — Telephone Encounter (Signed)
Rx(s) sent to pharmacy electronically.  

## 2016-04-21 ENCOUNTER — Encounter: Payer: Self-pay | Admitting: "Endocrinology

## 2016-04-21 ENCOUNTER — Ambulatory Visit (INDEPENDENT_AMBULATORY_CARE_PROVIDER_SITE_OTHER): Payer: Medicare Other | Admitting: "Endocrinology

## 2016-04-21 VITALS — BP 128/74 | HR 83 | Ht 67.0 in | Wt 194.0 lb

## 2016-04-21 DIAGNOSIS — E1165 Type 2 diabetes mellitus with hyperglycemia: Secondary | ICD-10-CM | POA: Diagnosis not present

## 2016-04-21 DIAGNOSIS — I10 Essential (primary) hypertension: Secondary | ICD-10-CM

## 2016-04-21 DIAGNOSIS — E782 Mixed hyperlipidemia: Secondary | ICD-10-CM

## 2016-04-21 DIAGNOSIS — Z794 Long term (current) use of insulin: Secondary | ICD-10-CM | POA: Diagnosis not present

## 2016-04-21 DIAGNOSIS — IMO0002 Reserved for concepts with insufficient information to code with codable children: Secondary | ICD-10-CM

## 2016-04-21 DIAGNOSIS — E118 Type 2 diabetes mellitus with unspecified complications: Secondary | ICD-10-CM

## 2016-04-21 MED ORDER — INSULIN LISPRO PROT & LISPRO (75-25 MIX) 100 UNIT/ML KWIKPEN: PEN_INJECTOR | SUBCUTANEOUS | 2 refills | Status: DC

## 2016-04-21 NOTE — Progress Notes (Signed)
Subjective:    Patient ID: Diana Campbell, female    DOB: August 17, 1945, PCP Colette Ribas, MD   Past Medical History:  Diagnosis Date  . Arthritis   . Automatic implantable cardioverter-defibrillator in situ 07/2010  . Cardiac abnormality    with defibrilator  . Chronic combined systolic and diastolic CHF (congestive heart failure) (HCC) 09/04/2015  . Diabetes mellitus   . H/O cardiac arrest 2006  . Hypercholesteremia   . Hypertension   . Neuropathy [facility])    Past Surgical History:  Procedure Laterality Date  . 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  Marland Kitchen CARDIAC CATHETERIZATION  04/14/2000   Normal coronary arteries.  Marland Kitchen CARDIAC DEFIBRILLATOR PLACEMENT  07/21/2010   Medtronic Protecta XT DR, model#D314DRG, serial#[id number]  . CARDIOVASCULAR STRESS TEST  04/05/2000   Scintigraphic evidence of mild LV dilatation and global LV dysfunction with an EF 41% with mild inferolateral ischemia.  Marland Kitchen CARPAL TUNNEL RELEASE Right 01/23/2016   Procedure: CARPAL TUNNEL RELEASE;  Surgeon: Vickki Hearing, MD;  Location: [facility];  Service: Orthopedics;  Laterality: Right;  pt knows to arrive at 6:15  . COLONOSCOPY  03/30/2011   Procedure: COLONOSCOPY;  Surgeon: Dalia Heading, MD;  Location: [facility];  Service: Gastroenterology;  Laterality: N/A;  . DORSAL COMPARTMENT RELEASE Right 08/11/2013   Procedure: RELEASE DORSAL COMPARTMENT (DEQUERVAIN);  Surgeon: Vickki Hearing, MD;  Location: [facility];  Service: Orthopedics;  Laterality: Right;  . TRANSTHORACIC ECHOCARDIOGRAM  08/03/2011   EF 45-50%, mildly reduced LV systolic function.   Social History   Social History  . Marital status: Married    Spouse name: N/A  . Number of children: N/A  . Years of education: N/A   Social History Main Topics  . Smoking status: Never Smoker  .  Smokeless tobacco: Current User    Types: Snuff     Comment: 3 dips per day  . Alcohol use No  . Drug use: No  . Sexual activity: Yes    Birth control/ protection: None   Other Topics Concern  . None   Social History Narrative  . None   Outpatient Encounter Prescriptions as of 04/21/2016  Medication Sig  . ACCU-CHEK AVIVA PLUS test strip TEST BLOOD SUGAR 4 TIMES A DAY (DX:E11.65)  . acetaminophen (TYLENOL) 325 MG tablet Take 650 mg by mouth at bedtime as needed (pain).  Marland Kitchen acetaminophen-codeine (TYLENOL #3) 300-30 MG tablet Take 1 tablet by mouth every 6 (six) hours as needed for moderate pain.  Marland Kitchen aspirin 325 MG EC tablet Take 325 mg by mouth daily.  Marland Kitchen atorvastatin (LIPITOR) 10 MG tablet Take 10 mg by mouth at bedtime.   . calcium-vitamin D (OSCAL WITH D) 500-200 MG-UNIT per tablet Take 1 tablet by mouth 2 (two) times daily.   . carvedilol (COREG) 12.5 MG tablet TAKE 0.5 TABLETS (6.25 MG TOTAL) BY MOUTH 2 (TWO) TIMES DAILY WITH A MEAL.  Marland Kitchen Cholecalciferol (VITAMIN D PO) Take 1 tablet by mouth daily.  Marland Kitchen gabapentin (NEURONTIN) 100 MG capsule Take 100 mg by mouth 2 (two) times daily as needed (pain).   . Insulin Lispro Prot & Lispro (HUMALOG MIX 75/25 KWIKPEN) (75-25) 100 UNIT/ML Kwikpen 35 units with breakfast and  30 units with supper  . lisinopril (PRINIVIL,ZESTRIL) 5 MG tablet Take 1 tablet (5 mg total) by mouth daily.  . [DISCONTINUED] Insulin  Lispro Prot & Lispro (HUMALOG MIX 75/25 KWIKPEN) (75-25) 100 UNIT/ML Kwikpen 30 units in the a.m. & 26 units at night   No facility-administered encounter medications on file as of 04/21/2016.    ALLERGIES: No Known Allergies VACCINATION STATUS:  There is no immunization history on file for this patient.  Diabetes She presents for her follow-up diabetic visit. She has type 2 diabetes mellitus. Onset time: She was diagnosed at approximate age of 15 years. Her disease course has been worsening. There are no hypoglycemic associated symptoms.  Pertinent negatives for hypoglycemia include no confusion, headaches, pallor or seizures. There are no diabetic associated symptoms. Pertinent negatives for diabetes include no chest pain, no polydipsia, no polyphagia and no polyuria. There are no hypoglycemic complications. Symptoms are worsening. Diabetic complications include nephropathy. Risk factors for coronary artery disease include diabetes mellitus, dyslipidemia, hypertension, sedentary lifestyle and tobacco exposure. Current diabetic treatment includes intensive insulin program (For unclear reasons, she stayed off of her Lantus.). She is compliant with treatment some of the time. Her weight is stable. She is following a generally unhealthy diet. She has not had a previous visit with a dietitian (She declined a referral to CDE.)  Her overall blood glucose range is 180-200 mg/dl. An ACE inhibitor/angiotensin II receptor blocker is being taken. Eye exam is current.  Hyperlipidemia This is a chronic problem. The current episode started more than 1 year ago. Pertinent negatives include no chest pain, myalgias or shortness of breath. Current antihyperlipidemic treatment includes statins. Risk factors for coronary artery disease include dyslipidemia, diabetes mellitus and a sedentary lifestyle.  Hypertension This is a chronic problem. The current episode started more than 1 year ago. The problem is controlled. Pertinent negatives include no chest pain, headaches, palpitations or shortness of breath. Past treatments include ACE inhibitors. Hypertensive end-organ damage includes kidney disease.     Review of Systems  Constitutional: Negative for unexpected weight change.  HENT: Negative for trouble swallowing and voice change.   Eyes: Negative for visual disturbance.  Respiratory: Negative for cough, shortness of breath and wheezing.   Cardiovascular: Negative for chest pain, palpitations and leg swelling.  Gastrointestinal: Negative for nausea,  vomiting and diarrhea.  Endocrine: Negative for cold intolerance, heat intolerance, polydipsia, polyphagia and polyuria.  Musculoskeletal: Negative for myalgias and arthralgias.  Skin: Negative for color change, pallor, rash and wound.  Neurological: Negative for seizures and headaches.  Psychiatric/Behavioral: Negative for suicidal ideas and confusion.    Objective:    BP 128/74   Pulse 83   Ht 5\' 7"  (1.702 m)   Wt 194 lb (88 kg)   BMI 30.38 kg/m   Wt Readings from Last 3 Encounters:  04/21/16 194 lb (88 kg)  01/21/16 196 lb (88.9 kg)  01/06/16 194 lb (88 kg)    Physical Exam  Constitutional: She is oriented to person, place, and time. She appears well-developed.  HENT:  Head: Normocephalic and atraumatic.  Eyes: EOM are normal.  Neck: Normal range of motion. Neck supple. No tracheal deviation present. No thyromegaly present.  Cardiovascular: Normal rate and regular rhythm.   Pulmonary/Chest: Effort normal and breath sounds normal.  Abdominal: Soft. Bowel sounds are normal. There is no tenderness. There is no guarding.  Musculoskeletal: Normal range of motion. She exhibits no edema.  Neurological: She is alert and oriented to person, place, and time. She has normal reflexes. No cranial nerve deficit. Coordination normal.  Skin: Skin is warm and dry. No rash noted. No erythema. No pallor.  Psychiatric: She has a normal mood and affect. Judgment normal.    Results for orders placed or performed in visit on 04/14/16  Microalbumin / creatinine urine ratio  Result Value Ref Range   Creatinine, Urine 112 20 - 320 mg/dL   Microalb, Ur 0.3 Not estab mg/dL   Microalb Creat Ratio 3 <30 mcg/mg creat  Comprehensive metabolic panel  Result Value Ref Range   Sodium 139 135 - 146 mmol/L   Potassium 4.2 3.5 - 5.3 mmol/L   Chloride 104 98 - 110 mmol/L   CO2 27 20 - 31 mmol/L   Glucose, Bld 266 (H) 65 - 99 mg/dL   BUN 14 7 - 25 mg/dL   Creat 6.04 (H) 5.40 - 0.93 mg/dL   Total  Bilirubin 0.4 0.2 - 1.2 mg/dL   Alkaline Phosphatase 67 33 - 130 U/L   AST 13 10 - 35 U/L   ALT 11 6 - 29 U/L   Total Protein 6.2 6.1 - 8.1 g/dL   Albumin 3.5 (L) 3.6 - 5.1 g/dL   Calcium 9.2 8.6 - 98.1 mg/dL  Hemoglobin X9J  Result Value Ref Range   Hgb A1c MFr Bld 9.0 (H) <5.7 %   Mean Plasma Glucose 212 mg/dL  Diabetic Labs (most recent): Lab Results  Component Value Date   HGBA1C 9.0 (H) 04/14/2016   HGBA1C 8.5 (H) 12/30/2015   HGBA1C CANCELED 09/26/2015     Assessment & Plan:   1. Type 2 diabetes mellitus with long-term current use of insulin (HCC)  - her diabetes is  complicated by stable CKD ( which is improving) and patient remains at a high risk for more acute and chronic complications of diabetes which include CAD, CVA, CKD, retinopathy, and neuropathy. These are all discussed in detail with the patient.  Patient came with above target glucose profile, and  recent A1c of 9% increasing from 8.5%.  - Glucose logs and insulin administration records pertaining to this visit,  to be scanned into patient's records.  Recent labs reviewed.   - I have re-counseled the patient on diet management and weight loss  by adopting a carbohydrate restricted / protein rich  Diet.  - Suggestion is made for patient to avoid simple carbohydrates   from their diet including Cakes , Desserts, Ice Cream,  Soda (  diet and regular) , Sweet Tea , Candies,  Chips, Cookies, Artificial Sweeteners,   and "Sugar-free" Products .  This will help patient to have stable blood glucose profile and potentially avoid unintended  Weight gain.  - Patient is advised to stick to a routine mealtimes to eat 3 meals  a day and avoid unnecessary snacks ( to snack only to correct hypoglycemia).  - The patient  declined referral to a  CDE for individualized DM education.  - I have approached patient with the following individualized plan to manage diabetes and patient agrees.   - Patient claims to have "low  readings" although the lowest blood glucose reading she had was 83. She was to avoid lowering blood glucose to below 100. However she will require larger dose of insulin to control diabetes. She agrees to increase slowly,   I will increase Humalog 75/25 to  35 units with breakfast and 30 units with supper associated with associated with monitoring of BG AC and HS.   -Adjustment parameters for hypo and hyperglycemia were given in a written document to patient. -Patient is encouraged to call clinic for blood glucose levels less than  70 or above 200 mg /dl. -She could not afford Invokana. -Her labs show her renal function is improving. She would have benefited from metformin however she reports intolerance to metformin.   - Patient specific target  for A1c; LDL, HDL, Triglycerides, and  Waist Circumference were discussed in detail.  2) BP/HTN: Controlled. Continue current medications including ACEI/ARB. 3) Lipids/HPL:  continue statins. 4)  Weight/Diet: CDE consult in progress, exercise, and carbohydrates information provided.  5) Chronic Care/Health Maintenance:  -Patient is on ACEI/ARB and Statin medications and encouraged to continue to follow up with Ophthalmology, Podiatrist at least yearly or according to recommendations, and advised to  stay away from smoking. I have recommended yearly flu vaccine and pneumonia vaccination at least every 5 years; moderate intensity exercise for up to 150 minutes weekly; and  sleep for at least 7 hours a day.  - 25 minutes of time was spent on the care of this patient , 50% of which was applied for counseling on diabetes complications and their preventions.  - I advised patient to maintain close follow up with Colette Ribas, MD for primary care needs.  Patient is asked to bring meter and  blood glucose logs during their next visit.   Follow up plan: -Return in about 3 months (around 07/21/2016) for follow up with pre-visit labs, meter, and  logs.  Marquis Lunch, MD Phone: [phone]  Fax: [phone]   04/21/2016, 8:46 AM

## 2016-04-21 NOTE — Patient Instructions (Signed)

## 2016-06-02 ENCOUNTER — Ambulatory Visit (INDEPENDENT_AMBULATORY_CARE_PROVIDER_SITE_OTHER): Payer: Medicare Other | Admitting: *Deleted

## 2016-06-02 ENCOUNTER — Telehealth: Payer: Self-pay | Admitting: Cardiology

## 2016-06-02 DIAGNOSIS — I428 Other cardiomyopathies: Secondary | ICD-10-CM | POA: Diagnosis not present

## 2016-06-02 DIAGNOSIS — I5042 Chronic combined systolic (congestive) and diastolic (congestive) heart failure: Secondary | ICD-10-CM

## 2016-06-02 NOTE — Telephone Encounter (Signed)
Spoke with pt and reminded pt of remote transmission that is due today. Pt verbalized understanding.   

## 2016-06-02 NOTE — Progress Notes (Signed)
Remote ICD transmission.   

## 2016-06-03 ENCOUNTER — Encounter: Payer: Self-pay | Admitting: Cardiology

## 2016-06-03 LAB — CUP PACEART REMOTE DEVICE CHECK
Brady Statistic AP VS Percent: 86.75 %
Brady Statistic AS VP Percent: 0.01 %
Brady Statistic RA Percent Paced: 86.29 %
Date Time Interrogation Session: 20180515202409
HIGH POWER IMPEDANCE MEASURED VALUE: 399 Ohm
HighPow Impedance: 41 Ohm
HighPow Impedance: 56 Ohm
Implantable Lead Implant Date: 20120702
Implantable Lead Location: 753860
Implantable Lead Model: 6947
Lead Channel Impedance Value: 475 Ohm
Lead Channel Pacing Threshold Amplitude: 0.75 V
Lead Channel Pacing Threshold Amplitude: 0.75 V
Lead Channel Pacing Threshold Pulse Width: 0.4 ms
Lead Channel Sensing Intrinsic Amplitude: 2.5 mV
Lead Channel Sensing Intrinsic Amplitude: 2.5 mV
Lead Channel Setting Pacing Pulse Width: 0.4 ms
MDC IDC LEAD IMPLANT DT: 20120702
MDC IDC LEAD LOCATION: 753859
MDC IDC MSMT BATTERY VOLTAGE: 2.8 V
MDC IDC MSMT LEADCHNL RA IMPEDANCE VALUE: 475 Ohm
MDC IDC MSMT LEADCHNL RA PACING THRESHOLD PULSEWIDTH: 0.4 ms
MDC IDC MSMT LEADCHNL RV SENSING INTR AMPL: 9.25 mV
MDC IDC MSMT LEADCHNL RV SENSING INTR AMPL: 9.25 mV
MDC IDC PG IMPLANT DT: 20120702
MDC IDC SET LEADCHNL RA PACING AMPLITUDE: 1.5 V
MDC IDC SET LEADCHNL RV PACING AMPLITUDE: 2 V
MDC IDC SET LEADCHNL RV SENSING SENSITIVITY: 0.3 mV
MDC IDC STAT BRADY AP VP PERCENT: 0.03 %
MDC IDC STAT BRADY AS VS PERCENT: 13.22 %
MDC IDC STAT BRADY RV PERCENT PACED: 0.03 %

## 2016-06-03 NOTE — Progress Notes (Signed)
Letter  

## 2016-06-10 ENCOUNTER — Other Ambulatory Visit: Payer: Self-pay | Admitting: "Endocrinology

## 2016-07-15 ENCOUNTER — Other Ambulatory Visit: Payer: Self-pay | Admitting: Family Medicine

## 2016-07-15 DIAGNOSIS — Z1231 Encounter for screening mammogram for malignant neoplasm of breast: Secondary | ICD-10-CM

## 2016-07-28 ENCOUNTER — Other Ambulatory Visit: Payer: Self-pay | Admitting: "Endocrinology

## 2016-07-29 LAB — HEMOGLOBIN A1C
Hgb A1c MFr Bld: 9.4 % — ABNORMAL HIGH (ref <5.7)
Mean Plasma Glucose: 223 mg/dL

## 2016-07-29 LAB — COMPREHENSIVE METABOLIC PANEL
ALT: 9 U/L (ref 6–29)
AST: 11 U/L (ref 10–35)
Albumin: 3.8 g/dL (ref 3.6–5.1)
Alkaline Phosphatase: 73 U/L (ref 33–130)
BILIRUBIN TOTAL: 0.4 mg/dL (ref 0.2–1.2)
BUN: 16 mg/dL (ref 7–25)
CALCIUM: 9.4 mg/dL (ref 8.6–10.4)
CO2: 23 mmol/L (ref 20–31)
CREATININE: 1.03 mg/dL — AB (ref 0.60–0.93)
Chloride: 104 mmol/L (ref 98–110)
GLUCOSE: 193 mg/dL — AB (ref 65–99)
Potassium: 4.6 mmol/L (ref 3.5–5.3)
Sodium: 141 mmol/L (ref 135–146)
Total Protein: 6.6 g/dL (ref 6.1–8.1)

## 2016-07-31 ENCOUNTER — Other Ambulatory Visit: Payer: Self-pay | Admitting: "Endocrinology

## 2016-08-04 ENCOUNTER — Encounter: Payer: Self-pay | Admitting: "Endocrinology

## 2016-08-04 ENCOUNTER — Ambulatory Visit (INDEPENDENT_AMBULATORY_CARE_PROVIDER_SITE_OTHER): Payer: Medicare Other | Admitting: "Endocrinology

## 2016-08-04 VITALS — BP 117/54 | HR 83 | Ht 67.0 in | Wt 196.0 lb

## 2016-08-04 DIAGNOSIS — I10 Essential (primary) hypertension: Secondary | ICD-10-CM | POA: Diagnosis not present

## 2016-08-04 DIAGNOSIS — E782 Mixed hyperlipidemia: Secondary | ICD-10-CM

## 2016-08-04 DIAGNOSIS — Z794 Long term (current) use of insulin: Secondary | ICD-10-CM

## 2016-08-04 DIAGNOSIS — IMO0002 Reserved for concepts with insufficient information to code with codable children: Secondary | ICD-10-CM

## 2016-08-04 DIAGNOSIS — E1165 Type 2 diabetes mellitus with hyperglycemia: Secondary | ICD-10-CM | POA: Diagnosis not present

## 2016-08-04 DIAGNOSIS — E118 Type 2 diabetes mellitus with unspecified complications: Secondary | ICD-10-CM

## 2016-08-04 MED ORDER — INSULIN LISPRO PROT & LISPRO (75-25 MIX) 100 UNIT/ML KWIKPEN: PEN_INJECTOR | SUBCUTANEOUS | 2 refills | Status: DC

## 2016-08-04 NOTE — Progress Notes (Signed)
Subjective:    Patient ID: Diana Campbell, female    DOB: [birth year], PCP Assunta Found, MD   Past Medical History:  Diagnosis Date  . Arthritis   . Automatic implantable cardioverter-defibrillator in situ 07/2010  . Cardiac abnormality    with defibrilator  . Chronic combined systolic and diastolic CHF (congestive heart failure) (HCC) 09/04/2015  . Diabetes mellitus   . H/O cardiac arrest 2006  . Hypercholesteremia   . Hypertension   . Neuropathy    Past Surgical History:  Procedure Laterality Date  . 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  Marland Kitchen CARDIAC CATHETERIZATION  04/14/2000   Normal coronary arteries.  Marland Kitchen CARDIAC DEFIBRILLATOR PLACEMENT  07/21/2010   Medtronic Protecta XT DR, model#[id number], serial#[id number]  . CARDIOVASCULAR STRESS TEST  04/05/2000   Scintigraphic evidence of mild LV dilatation and global LV dysfunction with an EF 41% with mild inferolateral ischemia.  Marland Kitchen CARPAL TUNNEL RELEASE Right 01/23/2016   Procedure: CARPAL TUNNEL RELEASE;  Surgeon: Vickki Hearing, MD;  Location: [facility];  Service: Orthopedics;  Laterality: Right;  pt knows to arrive at 6:15  . COLONOSCOPY  03/30/2011   Procedure: COLONOSCOPY;  Surgeon: Dalia Heading, MD;  Location: [facility];  Service: Gastroenterology;  Laterality: N/A;  . DORSAL COMPARTMENT RELEASE Right 08/11/2013   Procedure: RELEASE DORSAL COMPARTMENT (DEQUERVAIN);  Surgeon: Vickki Hearing, MD;  Location: [facility];  Service: Orthopedics;  Laterality: Right;  . TRANSTHORACIC ECHOCARDIOGRAM  08/03/2011   EF 45-50%, mildly reduced LV systolic function.   Social History   Social History  . Marital status: Married    Spouse name: N/A  . Number of children: N/A  . Years of education: N/A   Social History Main Topics  . Smoking status: Never Smoker  . Smokeless  tobacco: Current User    Types: Snuff     Comment: 3 dips per day  . Alcohol use No  . Drug use: No  . Sexual activity: Yes    Birth control/ protection: None   Other Topics Concern  . None   Social History Narrative  . None   Outpatient Encounter Prescriptions as of 08/04/2016  Medication Sig  . ACCU-CHEK AVIVA PLUS test strip TEST BLOOD SUGAR 4 TIMES A DAY (DX:E11.65)  . acetaminophen (TYLENOL) 325 MG tablet Take 650 mg by mouth at bedtime as needed (pain).  Marland Kitchen acetaminophen-codeine (TYLENOL #3) 300-30 MG tablet Take 1 tablet by mouth every 6 (six) hours as needed for moderate pain.  Marland Kitchen aspirin 325 MG EC tablet Take 325 mg by mouth daily.  Marland Kitchen atorvastatin (LIPITOR) 10 MG tablet Take 10 mg by mouth at bedtime.   . calcium-vitamin D (OSCAL WITH D) 500-200 MG-UNIT per tablet Take 1 tablet by mouth 2 (two) times daily.   . carvedilol (COREG) 12.5 MG tablet TAKE 0.5 TABLETS (6.25 MG TOTAL) BY MOUTH 2 (TWO) TIMES DAILY WITH A MEAL.  Marland Kitchen Cholecalciferol (VITAMIN D PO) Take 1 tablet by mouth daily.  Marland Kitchen gabapentin (NEURONTIN) 100 MG capsule Take 100 mg by mouth 2 (two) times daily as needed (pain).   . Insulin Lispro Prot & Lispro (HUMALOG MIX 75/25 KWIKPEN) (75-25) 100 UNIT/ML Kwikpen 35 units with breakfast and  30 units with supper  . Insulin Lispro Prot & Lispro (HUMALOG MIX 75/25 KWIKPEN) (75-25) 100 UNIT/ML Kwikpen Inject 40 units with breakfast and 35  units with supper  . lisinopril (PRINIVIL,ZESTRIL) 5 MG tablet Take 1 tablet (5 mg total) by mouth daily.  . [DISCONTINUED] Insulin Lispro Prot & Lispro (HUMALOG MIX 75/25 KWIKPEN) (75-25) 100 UNIT/ML Kwikpen Inject 35 units in the a.m. And 30 units at night   No facility-administered encounter medications on file as of 08/04/2016.    ALLERGIES: No Known Allergies VACCINATION STATUS:  There is no immunization history on file for this patient.  Diabetes She presents for her follow-up diabetic visit. She has type 2 diabetes mellitus. Onset  time: She was diagnosed at approximate age of 79 years. Her disease course has been worsening. There are no hypoglycemic associated symptoms. Pertinent negatives for hypoglycemia include no confusion, headaches, pallor or seizures. There are no diabetic associated symptoms. Pertinent negatives for diabetes include no chest pain, no polydipsia, no polyphagia and no polyuria. There are no hypoglycemic complications. Symptoms are worsening. Diabetic complications include nephropathy. Risk factors for coronary artery disease include diabetes mellitus, dyslipidemia, hypertension, sedentary lifestyle and tobacco exposure. Current diabetic treatment includes intensive insulin program (For unclear reasons, she stayed off of her Lantus.). She is compliant with treatment some of the time. Her weight is stable. She is following a generally unhealthy diet. She has not had a previous visit with a dietitian (She declined a referral to CDE.)  Her overall blood glucose range is 180-200 mg/dl. An ACE inhibitor/angiotensin II receptor blocker is being taken. Eye exam is current.  Hyperlipidemia This is a chronic problem. The current episode started more than 1 year ago. Pertinent negatives include no chest pain, myalgias or shortness of breath. Current antihyperlipidemic treatment includes statins. Risk factors for coronary artery disease include dyslipidemia, diabetes mellitus and a sedentary lifestyle.  Hypertension This is a chronic problem. The current episode started more than 1 year ago. The problem is controlled. Pertinent negatives include no chest pain, headaches, palpitations or shortness of breath. Past treatments include ACE inhibitors. Hypertensive end-organ damage includes kidney disease.     Review of Systems  Constitutional: Negative for unexpected weight change.  HENT: Negative for trouble swallowing and voice change.   Eyes: Negative for visual disturbance.  Respiratory: Negative for cough, shortness of  breath and wheezing.   Cardiovascular: Negative for chest pain, palpitations and leg swelling.  Gastrointestinal: Negative for nausea, vomiting and diarrhea.  Endocrine: Negative for cold intolerance, heat intolerance, polydipsia, polyphagia and polyuria.  Musculoskeletal: Negative for myalgias and arthralgias.  Skin: Negative for color change, pallor, rash and wound.  Neurological: Negative for seizures and headaches.  Psychiatric/Behavioral: Negative for suicidal ideas and confusion.    Objective:    BP (!) 117/54   Pulse 83   Ht 5\' 7"  (1.702 m)   Wt 196 lb (88.9 kg)   BMI 30.70 kg/m   Wt Readings from Last 3 Encounters:  08/04/16 196 lb (88.9 kg)  04/21/16 194 lb (88 kg)  01/21/16 196 lb (88.9 kg)    Physical Exam  Constitutional: She is oriented to person, place, and time. She appears well-developed.  HENT:  Head: Normocephalic and atraumatic.  Eyes: EOM are normal.  Neck: Normal range of motion. Neck supple. No tracheal deviation present. No thyromegaly present.  Cardiovascular: Normal rate and regular rhythm.   Pulmonary/Chest: Effort normal and breath sounds normal.  Abdominal: Soft. Bowel sounds are normal. There is no tenderness. There is no guarding.  Musculoskeletal: Normal range of motion. She exhibits no edema.  Neurological: She is alert and oriented to person, place, and time.  She has normal reflexes. No cranial nerve deficit. Coordination normal.  Skin: Skin is warm and dry. No rash noted. No erythema. No pallor.  Psychiatric: She has a normal mood and affect. Judgment normal.    Results for orders placed or performed in visit on 07/28/16  Comprehensive metabolic panel  Result Value Ref Range   Sodium 141 135 - 146 mmol/L   Potassium 4.6 3.5 - 5.3 mmol/L   Chloride 104 98 - 110 mmol/L   CO2 23 20 - 31 mmol/L   Glucose, Bld 193 (H) 65 - 99 mg/dL   BUN 16 7 - 25 mg/dL   Creat 1.61 (H) 0.96 - 0.93 mg/dL   Total Bilirubin 0.4 0.2 - 1.2 mg/dL   Alkaline  Phosphatase 73 33 - 130 U/L   AST 11 10 - 35 U/L   ALT 9 6 - 29 U/L   Total Protein 6.6 6.1 - 8.1 g/dL   Albumin 3.8 3.6 - 5.1 g/dL   Calcium 9.4 8.6 - 04.5 mg/dL  Hemoglobin W0J  Result Value Ref Range   Hgb A1c MFr Bld 9.4 (H) <5.7 %   Mean Plasma Glucose 223 mg/dL  Diabetic Labs (most recent): Lab Results  Component Value Date   HGBA1C 9.4 (H) 07/28/2016   HGBA1C 9.0 (H) 04/14/2016   HGBA1C 8.5 (H) 12/30/2015     Assessment & Plan:   1. Type 2 diabetes mellitus with long-term current use of insulin (HCC)  - her diabetes is  complicated by stable CKD ( which is improving) and patient remains at a high risk for more acute and chronic complications of diabetes which include CAD, CVA, CKD, retinopathy, and neuropathy. These are all discussed in detail with the patient.  Patient came with above target glucose profile, and  recent A1c of 9.4% slowly increasing from 8.5%.  - Glucose logs and insulin administration records pertaining to this visit,  to be scanned into patient's records.  Recent labs reviewed.   - I have re-counseled the patient on diet management and weight loss  by adopting a carbohydrate restricted / protein rich  Diet.  - Suggestion is made for patient to avoid simple carbohydrates   from her diet including Cakes , Desserts, Ice Cream,  Soda (  diet and regular) , Sweet Tea , Candies,  Chips, Cookies, Artificial Sweeteners,   and "Sugar-free" Products .  This will help patient to have stable blood glucose profile and potentially avoid unintended  Weight gain.  - Patient is advised to stick to a routine mealtimes to eat 3 meals  a day and avoid unnecessary snacks ( to snack only to correct hypoglycemia).  - The patient  declined referral to a  CDE for individualized DM education.  - I have approached patient with the following individualized plan to manage diabetes and patient agrees.   - Patient  Worries a lot about hypoglycemia. -she did not have hypoglycemia  since last visit. -  She wants to avoid lowering blood glucose to below 100. However she will require larger dose of insulin to control diabetes. She agrees to increase slowly,   I will increase Humalog 75/25 to  40 units with breakfast and 35 units with supper associated with associated with monitoring of BG AC and HS.   -Adjustment parameters for hypo and hyperglycemia were given in a written document to patient. -Patient is encouraged to call clinic for blood glucose levels less than 70 or above 200 mg /dl. -She could not afford  Invokana. -Her labs show her renal function is improving. She would have benefited from metformin however she reports intolerance to metformin.   - Patient specific target  for A1c; LDL, HDL, Triglycerides, and  Waist Circumference were discussed in detail.  2) BP/HTN: Controlled. Continue current medications including ACEI/ARB. 3) Lipids/HPL:  continue statins. 4)  Weight/Diet: CDE consult in progress, exercise, and carbohydrates information provided.  5) Chronic Care/Health Maintenance:  -Patient is on ACEI/ARB and Statin medications and encouraged to continue to follow up with Ophthalmology, Podiatrist at least yearly or according to recommendations, and advised to  stay away from smoking. I have recommended yearly flu vaccine and pneumonia vaccination at least every 5 years; moderate intensity exercise for up to 150 minutes weekly; and  sleep for at least 7 hours a day.  - 25 minutes of time was spent on the care of this patient , 50% of which was applied for counseling on diabetes complications and their preventions.  - I advised patient to maintain close follow up with Assunta Found, MD for primary care needs.  Patient is asked to bring meter and  blood glucose logs duringher next visit.   Follow up plan: -Return in about 3 months (around 11/04/2016) for follow up with pre-visit labs, meter, and logs.  Marquis Lunch, MD Phone: [phone]  Fax: [phone]    08/04/2016, 9:12 AM

## 2016-08-04 NOTE — Patient Instructions (Signed)

## 2016-08-17 ENCOUNTER — Telehealth: Payer: Self-pay | Admitting: Cardiovascular Disease

## 2016-08-17 NOTE — Telephone Encounter (Signed)
New message   Pt wants to know if since her defib is located on her left thigh, can she get a cortizone shot in her left shoulder.

## 2016-08-17 NOTE — Telephone Encounter (Signed)
Called patient to clarify placement of defib. She has device implanted in left upper chest. Advised that to my knowledge there is no issue w/injection in left shoulder but would clarify with Dr. Renaye Rakers nurse and she would be notified.

## 2016-08-18 ENCOUNTER — Other Ambulatory Visit: Payer: Self-pay | Admitting: "Endocrinology

## 2016-08-18 ENCOUNTER — Other Ambulatory Visit: Payer: Self-pay | Admitting: Cardiovascular Disease

## 2016-08-20 NOTE — Telephone Encounter (Signed)
Per MCr, she can have shoulder injection. It shouldn't interfere with patient's defibrillator. Left detailed message on voicemail. Okay per DPR.

## 2016-08-27 ENCOUNTER — Ambulatory Visit
Admission: RE | Admit: 2016-08-27 | Discharge: 2016-08-27 | Disposition: A | Discharge status: Home / Self Care | Payer: Medicare Other | Source: Ambulatory Visit | Attending: Family Medicine | Admitting: Family Medicine

## 2016-08-27 DIAGNOSIS — Z1231 Encounter for screening mammogram for malignant neoplasm of breast: Secondary | ICD-10-CM

## 2016-09-01 ENCOUNTER — Ambulatory Visit (INDEPENDENT_AMBULATORY_CARE_PROVIDER_SITE_OTHER): Payer: Medicare Other | Admitting: *Deleted

## 2016-09-01 DIAGNOSIS — I428 Other cardiomyopathies: Secondary | ICD-10-CM

## 2016-09-01 DIAGNOSIS — I5042 Chronic combined systolic (congestive) and diastolic (congestive) heart failure: Secondary | ICD-10-CM

## 2016-09-01 NOTE — Progress Notes (Signed)
Remote defibrillator check.  

## 2016-09-02 LAB — CUP PACEART REMOTE DEVICE CHECK
Battery Voltage: 2.72 V
Brady Statistic AP VS Percent: 78.86 %
Brady Statistic AS VP Percent: 0.01 %
Brady Statistic RV Percent Paced: 0.03 %
Date Time Interrogation Session: 20180814193630
HIGH POWER IMPEDANCE MEASURED VALUE: 48 Ohm
HighPow Impedance: 361 Ohm
HighPow Impedance: 40 Ohm
Implantable Lead Implant Date: 20120702
Implantable Lead Location: 753860
Implantable Lead Model: 5076
Lead Channel Impedance Value: 456 Ohm
Lead Channel Pacing Threshold Amplitude: 0.875 V
Lead Channel Pacing Threshold Pulse Width: 0.4 ms
Lead Channel Sensing Intrinsic Amplitude: 9.75 mV
Lead Channel Setting Sensing Sensitivity: 0.3 mV
MDC IDC LEAD IMPLANT DT: 20120702
MDC IDC LEAD LOCATION: 753859
MDC IDC MSMT LEADCHNL RA SENSING INTR AMPL: 2.375 mV
MDC IDC MSMT LEADCHNL RA SENSING INTR AMPL: 2.375 mV
MDC IDC MSMT LEADCHNL RV IMPEDANCE VALUE: 456 Ohm
MDC IDC MSMT LEADCHNL RV PACING THRESHOLD AMPLITUDE: 0.625 V
MDC IDC MSMT LEADCHNL RV PACING THRESHOLD PULSEWIDTH: 0.4 ms
MDC IDC MSMT LEADCHNL RV SENSING INTR AMPL: 9.75 mV
MDC IDC PG IMPLANT DT: 20120702
MDC IDC SET LEADCHNL RA PACING AMPLITUDE: 1.75 V
MDC IDC SET LEADCHNL RV PACING AMPLITUDE: 2 V
MDC IDC SET LEADCHNL RV PACING PULSEWIDTH: 0.4 ms
MDC IDC STAT BRADY AP VP PERCENT: 0.03 %
MDC IDC STAT BRADY AS VS PERCENT: 21.1 %
MDC IDC STAT BRADY RA PERCENT PACED: 78.37 %

## 2016-09-15 ENCOUNTER — Encounter: Payer: Self-pay | Admitting: Cardiology

## 2016-09-19 ENCOUNTER — Other Ambulatory Visit: Payer: Self-pay | Admitting: Cardiovascular Disease

## 2016-10-13 ENCOUNTER — Ambulatory Visit (INDEPENDENT_AMBULATORY_CARE_PROVIDER_SITE_OTHER): Payer: Medicare Other | Admitting: Cardiovascular Disease

## 2016-10-13 ENCOUNTER — Encounter: Payer: Self-pay | Admitting: Cardiovascular Disease

## 2016-10-13 VITALS — BP 120/64 | HR 83 | Ht 67.0 in | Wt 198.2 lb

## 2016-10-13 DIAGNOSIS — E1165 Type 2 diabetes mellitus with hyperglycemia: Secondary | ICD-10-CM | POA: Diagnosis not present

## 2016-10-13 DIAGNOSIS — I480 Paroxysmal atrial fibrillation: Secondary | ICD-10-CM

## 2016-10-13 DIAGNOSIS — I10 Essential (primary) hypertension: Secondary | ICD-10-CM

## 2016-10-13 DIAGNOSIS — I5042 Chronic combined systolic (congestive) and diastolic (congestive) heart failure: Secondary | ICD-10-CM

## 2016-10-13 DIAGNOSIS — Z794 Long term (current) use of insulin: Secondary | ICD-10-CM

## 2016-10-13 DIAGNOSIS — I428 Other cardiomyopathies: Secondary | ICD-10-CM

## 2016-10-13 DIAGNOSIS — E1121 Type 2 diabetes mellitus with diabetic nephropathy: Secondary | ICD-10-CM

## 2016-10-13 DIAGNOSIS — IMO0002 Reserved for concepts with insufficient information to code with codable children: Secondary | ICD-10-CM

## 2016-10-13 DIAGNOSIS — Z79899 Other long term (current) drug therapy: Secondary | ICD-10-CM | POA: Diagnosis not present

## 2016-10-13 DIAGNOSIS — Z95810 Presence of automatic (implantable) cardiac defibrillator: Secondary | ICD-10-CM

## 2016-10-13 DIAGNOSIS — E785 Hyperlipidemia, unspecified: Secondary | ICD-10-CM | POA: Diagnosis not present

## 2016-10-13 NOTE — Progress Notes (Signed)
Patient ID: Diana Campbell, female   DOB: February 23, 1945, 71 y.o.   MRN: [id number]    Cardiology Office Note    Date:  10/13/2016   ID:  Diana, Campbell Dec 07, 1945, MRN [id number]  PCP:  Diana Found, MD  Cardiologist:  Nicki Guadalajara, M.D. Thurmon Fair, MD   Chief Complaint  Patient presents with  . Follow-up    History of Present Illness:  Kamiryn LATARSHIA Diana is a 71 y.o. female a remote history of ventricular fibrillation arrest in June 2005, resuscitated, in the setting of nonischemic cardiomyopathy with left ventricular ejection fraction that has improved from 15% at diagnosis to around 45% by most recent evaluation. She has minor coronary atherosclerosis by previous angiography. She had a  Medtronic defibrillator/Sprint Fidelis lead,subsequently had a complete revision of her system with placement of a new right ventricular lead and dual-chamber generator in July 2012.  Additional problems include obstructive sleep apnea, diabetes mellitus on insulin, hypertension and hyperlipidemia.  Her biggest complaint is fatigue as well as multiple musculoskeletal issues, primarily her right shoulder and knees.  She is upset because she is having difficulty getting an appointment with her PCP.  The patient specifically denies any chest pain at rest exertion, dyspnea at rest or with exertion, orthopnea, paroxysmal nocturnal dyspnea, syncope, palpitations, focal neurological deficits, intermittent claudication, lower extremity edema, unexplained weight gain, cough, hemoptysis or wheezing. She has not required diuretic dose escalation.  ICD interrogation shows normal function, normal lead parameters, no ventricular tachycardia.  Arial pacing occurs 86% of the time and there is no ventricular pacing. Generator voltage is 2.69 V, RRT 2.63 V. Activity is fairly constant at 2 hours per day, although there is a slow downward trend over the last 2 months. Favorable heart rate histogram distribution. Her  thoracic impedance has been fairly stable, but she had evidence of recent fluid overload, self resolved.   Past Medical History:  Diagnosis Date  . Arthritis   . Automatic implantable cardioverter-defibrillator in situ 07/2010  . Cardiac abnormality    with defibrilator  . Chronic combined systolic and diastolic CHF (congestive heart failure) (HCC) 09/04/2015  . Diabetes mellitus   . H/O cardiac arrest 2006  . Hypercholesteremia   . Hypertension   . Neuropathy     Past Surgical History:  Procedure Laterality Date  . 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  Marland Kitchen CARDIAC CATHETERIZATION  04/14/2000   Normal coronary arteries.  Marland Kitchen CARDIAC DEFIBRILLATOR PLACEMENT  07/21/2010   Medtronic Protecta XT DR, model#[id number], serial#[id number]  . CARDIOVASCULAR STRESS TEST  04/05/2000   Scintigraphic evidence of mild LV dilatation and global LV dysfunction with an EF 41% with mild inferolateral ischemia.  Marland Kitchen CARPAL TUNNEL RELEASE Right 01/23/2016   Procedure: CARPAL TUNNEL RELEASE;  Surgeon: Vickki Hearing, MD;  Location: [facility];  Service: Orthopedics;  Laterality: Right;  pt knows to arrive at 6:15  . COLONOSCOPY  03/30/2011   Procedure: COLONOSCOPY;  Surgeon: Dalia Heading, MD;  [facility]: [facility];  Service: Gastroenterology;  Laterality: N/A;  . DORSAL COMPARTMENT RELEASE Right 08/11/2013   Procedure: RELEASE DORSAL COMPARTMENT (DEQUERVAIN);  Surgeon: Vickki Hearing, MD;  Location: [facility];  Service: Orthopedics;  Laterality: Right;  . TRANSTHORACIC ECHOCARDIOGRAM  08/03/2011   EF 45-50%, mildly reduced LV systolic function.    Outpatient Medications Prior to Visit  Medication Sig Dispense Refill  .  ACCU-CHEK AVIVA PLUS test strip TEST BLOOD SUGAR 4 TIMES A DAY (DX:E11.65) 150 each 2  . acetaminophen (TYLENOL) 325 MG tablet Take 650 mg  by mouth at bedtime as needed (pain).    Marland Kitchen aspirin 325 MG EC tablet Take 325 mg by mouth daily.    Marland Kitchen atorvastatin (LIPITOR) 10 MG tablet Take 10 mg by mouth at bedtime.     . calcium-vitamin D (OSCAL WITH D) 500-200 MG-UNIT per tablet Take 1 tablet by mouth 2 (two) times daily.     . carvedilol (COREG) 12.5 MG tablet TAKE 0.5 TABLETS (6.25 MG TOTAL) BY MOUTH 2 (TWO) TIMES DAILY WITH A MEAL. 30 tablet 9  . Cholecalciferol (VITAMIN D PO) Take 1 tablet by mouth daily.    . Insulin Lispro Prot & Lispro (HUMALOG MIX 75/25 KWIKPEN) (75-25) 100 UNIT/ML Kwikpen 40 units bid 30 mL 2  . lisinopril (PRINIVIL,ZESTRIL) 5 MG tablet TAKE 1 TABLET BY MOUTH EVERY DAY 30 tablet 0  . acetaminophen-codeine (TYLENOL #3) 300-30 MG tablet Take 1 tablet by mouth every 6 (six) hours as needed for moderate pain. (Patient not taking: Reported on 10/13/2016) 30 tablet 0  . gabapentin (NEURONTIN) 100 MG capsule Take 100 mg by mouth 2 (two) times daily as needed (pain).     . Insulin Lispro Prot & Lispro (HUMALOG MIX 75/25 KWIKPEN) (75-25) 100 UNIT/ML Kwikpen 35 units with breakfast and  30 units with supper (Patient not taking: Reported on 10/13/2016) 30 mL 2  . Insulin Lispro Prot & Lispro (HUMALOG MIX 75/25 KWIKPEN) (75-25) 100 UNIT/ML Kwikpen Inject 40 units with breakfast and 35 units with supper (Patient not taking: Reported on 10/13/2016) 15 mL 2   No facility-administered medications prior to visit.      Allergies:   Patient has no known allergies.   Social History   Social History  . Marital status: Married    Spouse name: N/A  . Number of children: N/A  . Years of education: N/A   Social History Main Topics  . Smoking status: Never Smoker  . Smokeless tobacco: Current User    Types: Snuff     Comment: 3 dips per day  . Alcohol use No  . Drug use: No  . Sexual activity: Yes    Birth control/ protection: None   Other Topics Concern  . None   Social History Narrative  . None     ROS:   Please see  the history of present illness.    ROS All other systems reviewed and are negative.   PHYSICAL EXAM:   VS:  BP 120/64   Pulse 83   Ht 5\' 7"  (1.702 m)   Wt 198 lb 3.2 oz (89.9 kg)   BMI 31.04 kg/m     General: Alert, oriented x3, no distress, mildly obese Head: no evidence of trauma, PERRL, EOMI, no exophtalmos or lid lag, no myxedema, no xanthelasma; normal ears, nose and oropharynx Neck: normal jugular venous pulsations and no hepatojugular reflux; brisk carotid pulses without delay and no carotid bruits Chest: clear to auscultation, no signs of consolidation by percussion or palpation, normal fremitus, symmetrical and full respiratory excursions,  healthy left subclavian pacemaker  Cardiovascular: normal position and quality of the apical impulse, regular rhythm, normal first and second heart sounds, no murmurs, rubs or gallops Abdomen: no tenderness or distention, no masses by palpation, no abnormal pulsatility or arterial bruits, normal bowel sounds, no hepatosplenomegaly Extremities: no clubbing, cyanosis or edema; 2+ radial, ulnar and  brachial pulses bilaterally; 2+ right femoral, posterior tibial and dorsalis pedis pulses; 2+ left femoral, posterior tibial and dorsalis pedis pulses; no subclavian or femoral bruits Neurological: grossly nonfocal Psych: Normal mood and affect   Wt Readings from Last 3 Encounters:  10/13/16 198 lb 3.2 oz (89.9 kg)  08/04/16 196 lb (88.9 kg)  04/21/16 194 lb (88 kg)      Studies/Labs Reviewed:   EKG:  EKG is ordered today. It shows atrial paced, ventricular sensed rhythm, left axis deviation, left bundle branch block (QRS 128 ms axis), QTC 477 ms  Recent Labs: 01/23/2016: Hemoglobin 12.3; Platelets 261 07/28/2016: ALT 9; BUN 16; Creat 1.03; Potassium 4.6; Sodium 141   Lipid Panel    Component Value Date/Time   CHOL 159 09/26/2015 0856   TRIG 71 09/26/2015 0856   HDL 68 09/26/2015 0856   CHOLHDL 2.3 09/26/2015 0856   VLDL 14 09/26/2015  0856   LDLCALC 77 09/26/2015 0856   ASSESSMENT:    1. Chronic combined systolic and diastolic CHF (congestive heart failure) (HCC)   2. Essential hypertension, benign   3. ICD (implantable cardioverter-defibrillator) in place   4. Uncontrolled type 2 diabetes mellitus with diabetic nephropathy, with long-term current use of insulin (HCC)   5. Paroxysmal atrial fibrillation (HCC)   6. Dyslipidemia   7. Medication management      PLAN:  In order of problems listed above:  1.CMP/CHF: compensated, slightly above previous "dry weight" of 195 pounds. Clinically euvolemic, thoracic impedance back to baseline. Has LBBB but does not meet criteria for CRT. 2. HTN:  Good control.  On ACE inhibitor and carvedilol 3. ICD: Normal device function. Continue remote downloads every 3 months and yearly office visits 4. DM: recent creatinine in 0.9-1.0 range, has diabetic nephropathy.  Has an appointment with her endocrinologist next monh 5. AFib: had one very brief episode in 2017, no recurrence.  I don't think anticoagulation is indicated 6. HLP: due for recheck lipid profile    Medication Adjustments/Labs and Tests Ordered: Current medicines are reviewed at length with the patient today.  Concerns regarding medicines are outlined above.  Medication changes, Labs and Tests ordered today are listed in the Patient Instructions below. Patient Instructions  Dr Royann Shivers recommends that you continue on your current medications as directed. Please refer to the Current Medication list given to you today.  Your physician recommends that you return for lab work TODAY.  Remote monitoring is used to monitor your Pacemaker or ICD from home. This monitoring reduces the number of office visits required to check your device to one time per year. It allows [place] to keep an eye on the functioning of your device to ensure it is working properly. You are scheduled for a device check from home on Tuesday, November  13th,2018. You may send your transmission at any time that day. If you have a wireless device, the transmission will be sent automatically. After your physician reviews your transmission, you will receive a notification with your next transmission date.  Dr Royann Shivers recommends that you schedule a follow-up appointment in 12 months with a defibrillator check. You will receive a reminder letter in the mail two months in advance. If you don't receive a letter, please call our office to schedule the follow-up appointment.  If you need a refill on your cardiac medications before your next appointment, please call your pharmacy.      Signed, Thurmon Fair, MD  10/13/2016 9:45 AM    [facility] [address], [place], [place]  [address] Phone: [phone]; Fax: [phone]

## 2016-10-13 NOTE — Patient Instructions (Signed)
Dr Royann Shivers recommends that you continue on your current medications as directed. Please refer to the Current Medication list given to you today.  Your physician recommends that you return for lab work TODAY.  Remote monitoring is used to monitor your Pacemaker or ICD from home. This monitoring reduces the number of office visits required to check your device to one time per year. It allows [place] to keep an eye on the functioning of your device to ensure it is working properly. You are scheduled for a device check from home on Tuesday, November 13th,2018. You may send your transmission at any time that day. If you have a wireless device, the transmission will be sent automatically. After your physician reviews your transmission, you will receive a notification with your next transmission date.  Dr Royann Shivers recommends that you schedule a follow-up appointment in 12 months with a defibrillator check. You will receive a reminder letter in the mail two months in advance. If you don't receive a letter, please call our office to schedule the follow-up appointment.  If you need a refill on your cardiac medications before your next appointment, please call your pharmacy.

## 2016-10-18 ENCOUNTER — Other Ambulatory Visit: Payer: Self-pay | Admitting: Cardiovascular Disease

## 2016-11-03 ENCOUNTER — Other Ambulatory Visit: Payer: Self-pay | Admitting: "Endocrinology

## 2016-11-04 ENCOUNTER — Ambulatory Visit: Payer: Medicare Other | Admitting: "Endocrinology

## 2016-11-14 ENCOUNTER — Other Ambulatory Visit: Payer: Self-pay | Admitting: "Endocrinology

## 2016-11-17 ENCOUNTER — Other Ambulatory Visit: Payer: Self-pay | Admitting: Cardiovascular Disease

## 2016-11-17 NOTE — Telephone Encounter (Signed)
Rx(s) sent to pharmacy electronically.  

## 2016-11-24 ENCOUNTER — Other Ambulatory Visit: Payer: Self-pay | Admitting: Cardiovascular Disease

## 2016-11-24 LAB — LIPID PANEL
Cholesterol: 147 mg/dL (ref <200)
HDL: 67 mg/dL (ref >50)
LDL Cholesterol (Calc): 64 mg/dL (calc)
Non-HDL Cholesterol (Calc): 80 mg/dL (calc) (ref <130)
Total CHOL/HDL Ratio: 2.2 (calc) (ref <5.0)
Triglycerides: 75 mg/dL (ref <150)

## 2016-11-24 LAB — TSH: TSH: 0.8 m[IU]/L (ref 0.40–4.50)

## 2016-11-25 ENCOUNTER — Ambulatory Visit: Payer: Medicare Other | Admitting: "Endocrinology

## 2016-11-25 LAB — RENAL FUNCTION PANEL
Albumin: 3.6 g/dL (ref 3.6–5.1)
BUN: 10 mg/dL (ref 7–25)
CALCIUM: 9.2 mg/dL (ref 8.6–10.4)
CHLORIDE: 107 mmol/L (ref 98–110)
CO2: 28 mmol/L (ref 20–32)
CREATININE: 0.84 mg/dL (ref 0.60–0.93)
GLUCOSE: 139 mg/dL — AB (ref 65–99)
POTASSIUM: 4.3 mmol/L (ref 3.5–5.3)
Phosphorus: 3.5 mg/dL (ref 2.1–4.3)
SODIUM: 142 mmol/L (ref 135–146)

## 2016-11-25 LAB — HEMOGLOBIN A1C
EAG (MMOL/L): 8.9 (calc)
HEMOGLOBIN A1C: 7.2 %{Hb} — AB (ref <5.7)
MEAN PLASMA GLUCOSE: 160 (calc)

## 2016-12-01 ENCOUNTER — Ambulatory Visit: Payer: Medicare Other | Admitting: *Deleted

## 2016-12-01 ENCOUNTER — Telehealth: Payer: Self-pay | Admitting: Cardiology

## 2016-12-01 NOTE — Telephone Encounter (Signed)
Spoke with pt and reminded pt of remote transmission that is due today. Pt verbalized understanding.   

## 2016-12-03 ENCOUNTER — Ambulatory Visit (INDEPENDENT_AMBULATORY_CARE_PROVIDER_SITE_OTHER): Payer: Medicare Other | Admitting: *Deleted

## 2016-12-03 ENCOUNTER — Encounter: Payer: Self-pay | Admitting: Cardiology

## 2016-12-03 DIAGNOSIS — I428 Other cardiomyopathies: Secondary | ICD-10-CM

## 2016-12-03 DIAGNOSIS — I5042 Chronic combined systolic (congestive) and diastolic (congestive) heart failure: Secondary | ICD-10-CM

## 2016-12-15 ENCOUNTER — Ambulatory Visit (INDEPENDENT_AMBULATORY_CARE_PROVIDER_SITE_OTHER): Payer: Medicare Other | Admitting: "Endocrinology

## 2016-12-15 ENCOUNTER — Encounter: Payer: Self-pay | Admitting: "Endocrinology

## 2016-12-15 VITALS — BP 121/65 | HR 82 | Ht 67.0 in | Wt 203.0 lb

## 2016-12-15 DIAGNOSIS — Z794 Long term (current) use of insulin: Secondary | ICD-10-CM | POA: Diagnosis not present

## 2016-12-15 DIAGNOSIS — E782 Mixed hyperlipidemia: Secondary | ICD-10-CM | POA: Diagnosis not present

## 2016-12-15 DIAGNOSIS — E118 Type 2 diabetes mellitus with unspecified complications: Secondary | ICD-10-CM | POA: Diagnosis not present

## 2016-12-15 DIAGNOSIS — I10 Essential (primary) hypertension: Secondary | ICD-10-CM

## 2016-12-15 DIAGNOSIS — E1165 Type 2 diabetes mellitus with hyperglycemia: Secondary | ICD-10-CM

## 2016-12-15 DIAGNOSIS — IMO0002 Reserved for concepts with insufficient information to code with codable children: Secondary | ICD-10-CM

## 2016-12-15 NOTE — Patient Instructions (Signed)

## 2016-12-15 NOTE — Progress Notes (Signed)
Subjective:    Patient ID: Diana Campbell, female    DOB: 23-Mar-1945, PCP Assunta Found, MD   Past Medical History:  Diagnosis Date  . Arthritis   . Automatic implantable cardioverter-defibrillator in situ 07/2010  . Cardiac abnormality    with defibrilator  . Chronic combined systolic and diastolic CHF (congestive heart failure) (HCC) 09/04/2015  . Diabetes mellitus   . H/O cardiac arrest 2006  . Hypercholesteremia   . Hypertension   . Neuropathy    Past Surgical History:  Procedure Laterality Date  . 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  Marland Kitchen CARDIAC CATHETERIZATION  04/14/2000   Normal coronary arteries.  Marland Kitchen CARDIAC DEFIBRILLATOR PLACEMENT  07/21/2010   Medtronic Protecta XT DR, model#D314DRG, serial#[id number]  . CARDIOVASCULAR STRESS TEST  04/05/2000   Scintigraphic evidence of mild LV dilatation and global LV dysfunction with an EF 41% with mild inferolateral ischemia.  Marland Kitchen CARPAL TUNNEL RELEASE Right 01/23/2016   Procedure: CARPAL TUNNEL RELEASE;  Surgeon: Vickki Hearing, MD;  Location: [facility];  Service: Orthopedics;  Laterality: Right;  pt knows to arrive at 6:15  . COLONOSCOPY  03/30/2011   Procedure: COLONOSCOPY;  Surgeon: Dalia Heading, MD;  Location: [facility];  Service: Gastroenterology;  Laterality: N/A;  . DORSAL COMPARTMENT RELEASE Right 08/11/2013   Procedure: RELEASE DORSAL COMPARTMENT (DEQUERVAIN);  Surgeon: Vickki Hearing, MD;  Location: [facility];  Service: Orthopedics;  Laterality: Right;  . TRANSTHORACIC ECHOCARDIOGRAM  08/03/2011   EF 45-50%, mildly reduced LV systolic function.   Social History   Socioeconomic History  . Marital status: Married    Spouse name: None  . Number of children: None  . Years of education: None  . Highest education level: None  Social Needs  . Financial resource  strain: None  . Food insecurity - worry: None  . Food insecurity - inability: None  . Transportation needs - medical: None  . Transportation needs - non-medical: None  Occupational History  . None  Tobacco Use  . Smoking status: Never Smoker  . Smokeless tobacco: Current User    Types: Snuff  . Tobacco comment: 3 dips per day  Substance and Sexual Activity  . Alcohol use: No  . Drug use: No  . Sexual activity: Yes    Birth control/protection: None  Other Topics Concern  . None  Social History Narrative  . None   Outpatient Encounter Medications as of 12/15/2016  Medication Sig  . ACCU-CHEK AVIVA PLUS test strip USE TO TEST BLOOD SUGAR 4 TIMES A DAY (DX: E11.65)  . acetaminophen (TYLENOL) 325 MG tablet Take 650 mg by mouth at bedtime as needed (pain).  Marland Kitchen aspirin 325 MG EC tablet Take 325 mg by mouth daily.  Marland Kitchen atorvastatin (LIPITOR) 10 MG tablet Take 10 mg by mouth at bedtime.   . calcium-vitamin D (OSCAL WITH D) 500-200 MG-UNIT per tablet Take 1 tablet by mouth 2 (two) times daily.   . carvedilol (COREG) 12.5 MG tablet TAKE 0.5 TABLETS (6.25 MG TOTAL) BY MOUTH 2 (TWO) TIMES DAILY WITH A MEAL.  Marland Kitchen Cholecalciferol (VITAMIN D PO) Take 1 tablet by mouth daily.  Marland Kitchen HUMALOG MIX 75/25 KWIKPEN (75-25) 100 UNIT/ML Kwikpen INJECT 40 UNITS UNDER THE SKIN TWICE A DAY (Patient taking differently: INJECT 40 UNITS at lunch and 35 units qhs)  . lisinopril (PRINIVIL,ZESTRIL) 5 MG tablet TAKE  1 TABLET BY MOUTH EVERY DAY   No facility-administered encounter medications on file as of 12/15/2016.    ALLERGIES: No Known Allergies VACCINATION STATUS:  There is no immunization history on file for this patient.  Diabetes She presents for her follow-up diabetic visit. She has type 2 diabetes mellitus. Onset time: She was diagnosed at approximate age of 1 years. Her disease course has been worsening. There are no hypoglycemic associated symptoms. Pertinent negatives for hypoglycemia include no  confusion, headaches, pallor or seizures. There are no diabetic associated symptoms. Pertinent negatives for diabetes include no chest pain, no polydipsia, no polyphagia and no polyuria. There are no hypoglycemic complications. Symptoms are worsening. Diabetic complications include nephropathy. Risk factors for coronary artery disease include diabetes mellitus, dyslipidemia, hypertension, sedentary lifestyle and tobacco exposure. Current diabetic treatment includes intensive insulin program (For unclear reasons, she stayed off of her Lantus.).  She is compliant with treatment some of the time. Her weight is stable. She is following a generally unhealthy diet. She has not had a previous visit with a dietitian (She declined a referral to CDE.)  Her overall blood glucose range is 180-200 mg/dl. An ACE inhibitor/angiotensin II receptor blocker is being taken. Eye exam is current.  Hyperlipidemia This is a chronic problem. The current episode started more than 1 year ago. Pertinent negatives include no chest pain, myalgias or shortness of breath. Current antihyperlipidemic treatment includes statins. Risk factors for coronary artery disease include dyslipidemia, diabetes mellitus and a sedentary lifestyle.  Hypertension This is a chronic problem. The current episode started more than 1 year ago. The problem is controlled. Pertinent negatives include no chest pain, headaches, palpitations or shortness of breath. Past treatments include ACE inhibitors. Hypertensive end-organ damage includes kidney disease.     Review of Systems  Constitutional: Negative for unexpected weight change.  HENT: Negative for trouble swallowing and voice change.   Eyes: Negative for visual disturbance.  Respiratory: Negative for cough, shortness of breath and wheezing.   Cardiovascular: Negative for chest pain, palpitations and leg swelling.  Gastrointestinal: Negative for nausea, vomiting and diarrhea.  Endocrine: Negative for  cold intolerance, heat intolerance, polydipsia, polyphagia and polyuria.  Musculoskeletal: Negative for myalgias and arthralgias.  Skin: Negative for color change, pallor, rash and wound.  Neurological: Negative for seizures and headaches.  Psychiatric/Behavioral: Negative for suicidal ideas and confusion.    Objective:    BP 121/65   Pulse 82   Ht 5\' 7"  (1.702 m)   Wt 203 lb (92.1 kg)   BMI 31.79 kg/m   Wt Readings from Last 3 Encounters:  12/15/16 203 lb (92.1 kg)  10/13/16 198 lb 3.2 oz (89.9 kg)  08/04/16 196 lb (88.9 kg)    Physical Exam  Constitutional: She is oriented to person, place, and time. She appears well-developed.  HENT:  Head: Normocephalic and atraumatic.  Eyes: EOM are normal.  Neck: Normal range of motion. Neck supple. No tracheal deviation present. No thyromegaly present.  Cardiovascular: Normal rate and regular rhythm.   Pulmonary/Chest: Effort normal and breath sounds normal.  Abdominal: Soft. Bowel sounds are normal. There is no tenderness. There is no guarding.  Musculoskeletal: Normal range of motion. She exhibits no edema.  Neurological: She is alert and oriented to person, place, and time. She has normal reflexes. No cranial nerve deficit. Coordination normal.  Skin: Skin is warm and dry. No rash noted. No erythema. No pallor.  Psychiatric: She has a normal mood and affect. Judgment normal.  Results for orders placed or performed in visit on 11/24/16  Lipid panel  Result Value Ref Range   Cholesterol 147 <200 mg/dL   HDL 67 >16>50 mg/dL   Triglycerides 75 <109<150 mg/dL   LDL Cholesterol (Calc) 64 mg/dL (calc)   Total CHOL/HDL Ratio 2.2 <5.0 (calc)   Non-HDL Cholesterol (Calc) 80 <604<130 mg/dL (calc)  TSH  Result Value Ref Range   TSH 0.80 0.40 - 4.50 mIU/L  Diabetic Labs (most recent): Lab Results  Component Value Date   HGBA1C 7.2 (H) 11/24/2016   HGBA1C 9.4 (H) 07/28/2016   HGBA1C 9.0 (H) 04/14/2016     Assessment & Plan:   1. Type 2  diabetes mellitus with long-term current use of insulin (HCC)  - her diabetes is  complicated by stable CKD ( which is improving) and patient remains at a high risk for more acute and chronic complications of diabetes which include CAD, CVA, CKD, retinopathy, and neuropathy. These are all discussed in detail with the patient.  Patient came with controlled in near target blood glucose profile, her A1c improving to 7.2% from 9.4%.  - Glucose logs and insulin administration records pertaining to this visit,  to be scanned into patient's records.  Recent labs reviewed.   - I have re-counseled the patient on diet management and weight loss  by adopting a carbohydrate restricted / protein rich  Diet.  -  Suggestion is made for her to avoid simple carbohydrates  from her diet including Cakes, Sweet Desserts / Pastries, Ice Cream, Soda (diet and regular), Sweet Tea, Candies, Chips, Cookies, Store Bought Juices, Alcohol in Excess of  1-2 drinks a day, Artificial Sweeteners, and "Sugar-free" Products. This will help patient to have stable blood glucose profile and potentially avoid unintended weight gain.   - Patient is advised to stick to a routine mealtimes to eat 3 meals  a day and avoid unnecessary snacks ( to snack only to correct hypoglycemia).  - The patient  declined referral to a  CDE for individualized DM education.  - I have approached patient with the following individualized plan to manage diabetes and patient agrees.   - Patient  Worries a lot about hypoglycemia. -she did not have hypoglycemia since last visit. -  She wants to avoid lowering blood glucose to below 100. However she will require larger dose of insulin to control diabetes. She agrees to continue Humalog 75/25  40 units with breakfast and 35 units with supper associated with associated with monitoring of blood glucose 4 times a day-before meals and at bedtime.   -Adjustment parameters for hypo and hyperglycemia were given in  a written document to patient. -Patient is encouraged to call clinic for blood glucose levels less than 70 or above 200 mg /dl.  -Her labs show her renal function is improving. She would have benefited from metformin however she reports intolerance to metformin.   - Patient specific target  for A1c; LDL, HDL, Triglycerides, and  Waist Circumference were discussed in detail.  2) BP/HTN: Controlled.  I advised her to continue current medications including  ACEI/ARB. 3) Lipids/HPL:  continue statins. 4)  Weight/Diet: CDE consult in progress, exercise, and carbohydrates information provided.  5) Chronic Care/Health Maintenance:  -Patient is on ACEI/ARB and Statin medications and encouraged to continue to follow up with Ophthalmology, Podiatrist at least yearly or according to recommendations, and advised to  stay away from smoking. I have recommended yearly flu vaccine and pneumonia vaccination at least every 5 years;  moderate intensity exercise for up to 150 minutes weekly; and  sleep for at least 7 hours a day.   - I advised patient to maintain close follow up with Assunta Found, MD for primary care needs.  - Time spent with the patient: 25 min, of which >50% was spent in reviewing her sugar logs , discussing her hypo- and hyper-glycemic episodes, reviewing her current and  previous labs and insulin doses and developing a plan to avoid hypo- and hyper-glycemia.    Follow up plan: -Return in about 6 months (around 06/14/2017) for meter, and logs.  Marquis Lunch, MD Phone: [phone]  Fax: [phone]  -  This note was partially dictated with voice recognition software. Similar sounding words can be transcribed inadequately or may not  be corrected upon review.  12/15/2016, 11:23 AM

## 2016-12-17 ENCOUNTER — Encounter: Payer: Self-pay | Admitting: Cardiology

## 2016-12-17 NOTE — Progress Notes (Signed)
Remote ICD transmission.   

## 2016-12-18 LAB — CUP PACEART REMOTE DEVICE CHECK
Battery Voltage: 2.66 V
Brady Statistic AP VP Percent: 0.03 %
Brady Statistic AP VS Percent: 86.34 %
Brady Statistic AS VS Percent: 13.63 %
HIGH POWER IMPEDANCE MEASURED VALUE: 54 Ohm
HighPow Impedance: 361 Ohm
HighPow Impedance: 42 Ohm
Implantable Lead Implant Date: 20120702
Implantable Lead Model: 6947
Implantable Pulse Generator Implant Date: 20120702
Lead Channel Impedance Value: 456 Ohm
Lead Channel Pacing Threshold Amplitude: 0.75 V
Lead Channel Pacing Threshold Pulse Width: 0.4 ms
Lead Channel Sensing Intrinsic Amplitude: 2.5 mV
MDC IDC LEAD IMPLANT DT: 20120702
MDC IDC LEAD LOCATION: 753859
MDC IDC LEAD LOCATION: 753860
MDC IDC MSMT LEADCHNL RA SENSING INTR AMPL: 2.5 mV
MDC IDC MSMT LEADCHNL RV IMPEDANCE VALUE: 456 Ohm
MDC IDC MSMT LEADCHNL RV PACING THRESHOLD AMPLITUDE: 0.75 V
MDC IDC MSMT LEADCHNL RV PACING THRESHOLD PULSEWIDTH: 0.4 ms
MDC IDC MSMT LEADCHNL RV SENSING INTR AMPL: 9 mV
MDC IDC MSMT LEADCHNL RV SENSING INTR AMPL: 9 mV
MDC IDC SESS DTM: 20181115202809
MDC IDC SET LEADCHNL RA PACING AMPLITUDE: 1.75 V
MDC IDC SET LEADCHNL RV PACING AMPLITUDE: 2 V
MDC IDC SET LEADCHNL RV PACING PULSEWIDTH: 0.4 ms
MDC IDC SET LEADCHNL RV SENSING SENSITIVITY: 0.3 mV
MDC IDC STAT BRADY AS VP PERCENT: 0.01 %
MDC IDC STAT BRADY RA PERCENT PACED: 86.17 %
MDC IDC STAT BRADY RV PERCENT PACED: 0.03 %

## 2017-01-05 LAB — CUP PACEART INCLINIC DEVICE CHECK
Brady Statistic AP VS Percent: 85.86 %
Brady Statistic RA Percent Paced: 85.45 %
Date Time Interrogation Session: 20180925131436
HIGH POWER IMPEDANCE MEASURED VALUE: 399 Ohm
HIGH POWER IMPEDANCE MEASURED VALUE: 46 Ohm
HighPow Impedance: 59 Ohm
Implantable Lead Implant Date: 20120702
Implantable Lead Implant Date: 20120702
Implantable Lead Location: 753860
Implantable Pulse Generator Implant Date: 20120702
Lead Channel Impedance Value: 475 Ohm
Lead Channel Pacing Threshold Amplitude: 0.875 V
Lead Channel Pacing Threshold Pulse Width: 0.4 ms
Lead Channel Sensing Intrinsic Amplitude: 2.625 mV
MDC IDC LEAD LOCATION: 753859
MDC IDC MSMT BATTERY VOLTAGE: 2.69 V
MDC IDC MSMT LEADCHNL RA IMPEDANCE VALUE: 475 Ohm
MDC IDC MSMT LEADCHNL RA PACING THRESHOLD PULSEWIDTH: 0.4 ms
MDC IDC MSMT LEADCHNL RA SENSING INTR AMPL: 2.625 mV
MDC IDC MSMT LEADCHNL RV PACING THRESHOLD AMPLITUDE: 0.625 V
MDC IDC MSMT LEADCHNL RV SENSING INTR AMPL: 10.25 mV
MDC IDC MSMT LEADCHNL RV SENSING INTR AMPL: 10.25 mV
MDC IDC SET LEADCHNL RA PACING AMPLITUDE: 2 V
MDC IDC SET LEADCHNL RV PACING AMPLITUDE: 2 V
MDC IDC SET LEADCHNL RV PACING PULSEWIDTH: 0.4 ms
MDC IDC SET LEADCHNL RV SENSING SENSITIVITY: 0.3 mV
MDC IDC STAT BRADY AP VP PERCENT: 0.03 %
MDC IDC STAT BRADY AS VP PERCENT: 0.01 %
MDC IDC STAT BRADY AS VS PERCENT: 14.1 %
MDC IDC STAT BRADY RV PERCENT PACED: 0.04 %

## 2017-01-14 ENCOUNTER — Other Ambulatory Visit: Payer: Self-pay | Admitting: Cardiovascular Disease

## 2017-02-22 ENCOUNTER — Other Ambulatory Visit: Payer: Self-pay | Admitting: Cardiovascular Disease

## 2017-02-22 NOTE — Telephone Encounter (Signed)
Rx request sent to pharmacy.  

## 2017-03-18 ENCOUNTER — Ambulatory Visit (INDEPENDENT_AMBULATORY_CARE_PROVIDER_SITE_OTHER): Payer: Medicare Other | Admitting: *Deleted

## 2017-03-18 DIAGNOSIS — I5042 Chronic combined systolic (congestive) and diastolic (congestive) heart failure: Secondary | ICD-10-CM

## 2017-03-18 DIAGNOSIS — I428 Other cardiomyopathies: Secondary | ICD-10-CM

## 2017-03-19 ENCOUNTER — Other Ambulatory Visit: Payer: Self-pay | Admitting: "Endocrinology

## 2017-03-19 ENCOUNTER — Encounter: Payer: Self-pay | Admitting: Cardiology

## 2017-03-19 NOTE — Progress Notes (Signed)
Remote ICD transmission.   

## 2017-03-30 ENCOUNTER — Encounter: Payer: Self-pay | Admitting: Cardiovascular Disease

## 2017-03-30 ENCOUNTER — Ambulatory Visit: Payer: Medicare Other | Admitting: Cardiovascular Disease

## 2017-03-30 VITALS — BP 147/67 | HR 63 | Ht 67.0 in | Wt 209.4 lb

## 2017-03-30 DIAGNOSIS — N182 Chronic kidney disease, stage 2 (mild): Secondary | ICD-10-CM | POA: Diagnosis not present

## 2017-03-30 DIAGNOSIS — E7800 Pure hypercholesterolemia, unspecified: Secondary | ICD-10-CM

## 2017-03-30 DIAGNOSIS — Z95810 Presence of automatic (implantable) cardiac defibrillator: Secondary | ICD-10-CM

## 2017-03-30 DIAGNOSIS — I10 Essential (primary) hypertension: Secondary | ICD-10-CM

## 2017-03-30 DIAGNOSIS — I428 Other cardiomyopathies: Secondary | ICD-10-CM | POA: Diagnosis not present

## 2017-03-30 DIAGNOSIS — Z79899 Other long term (current) drug therapy: Secondary | ICD-10-CM | POA: Diagnosis not present

## 2017-03-30 DIAGNOSIS — Z794 Long term (current) use of insulin: Secondary | ICD-10-CM

## 2017-03-30 DIAGNOSIS — E1122 Type 2 diabetes mellitus with diabetic chronic kidney disease: Secondary | ICD-10-CM

## 2017-03-30 DIAGNOSIS — E669 Obesity, unspecified: Secondary | ICD-10-CM | POA: Diagnosis not present

## 2017-03-30 MED ORDER — HYDROCHLOROTHIAZIDE 12.5 MG PO CAPS
12.5000 mg | ORAL_CAPSULE | ORAL | 3 refills | Status: DC | PRN
Start: 2017-03-30 — End: 2018-01-31

## 2017-03-30 MED ORDER — LISINOPRIL 5 MG PO TABS: 7.5000 mg | ORAL_TABLET | Freq: Every day | ORAL | 3 refills | Status: DC

## 2017-03-30 NOTE — Patient Instructions (Addendum)
Medication Instructions:  INCREASE lisinopril to 7.5 mg (1.5 tablet) daily  START HCTZ 12.5 mg (1 tablet) AS NEEDED for ankle swelling  Testing/Procedures: Your physician has requested that you have an echocardiogram. Echocardiography is a painless test that uses sound waves to create images of your heart. It provides your doctor with information about the size and shape of your heart and how well your heart's chambers and valves are working. This procedure takes approximately one hour. There are no restrictions for this procedure.  This will be done at our [facility] location:  [address]   Follow-Up: Your physician wants you to follow-up in: 12 months with Dr. Tresa Endo.  You will receive a reminder letter in the mail two months in advance. If you don't receive a letter, please call our office to schedule the follow-up appointment.   Any Other Special Instructions Will Be Listed Below (If Applicable).     If you need a refill on your cardiac medications before your next appointment, please call your pharmacy.

## 2017-03-30 NOTE — Progress Notes (Signed)
Patient ID: Diana Campbell, female   DOB: July 14, 1945, 72 y.o.   MRN: [id number]     HPI: Diana Campbell is a 72 y.o. female who presents to the office for a cardiology evaluation.  I have not seen her since March 2017.  Diana Campbell suffered a ventricular fibrillation cardiac arrest in June 2005 at which time she was successfully resuscitated. She was found to have a nonischemic cardiomyopathy;  initial ejection fraction 15%. Catheterization revealed 30-40% intermediate stenosis. At that time, she underwent single-chamber Medtronic ICD implantation and initially had a spring fidelis lead. In July 2012 she underwent complete revision of her system.  An echo Doppler study in July 2013 showed an ejection fraction now at 45-50% with mild global hypokinesis. She did have mild MR, mild TR, and mild aortic sclerosis.  Additional problems include type 2 diabetes mellitus on insulin, hypertension, hyperlipidemia, and probable untreated obstructive sleep apnea.  In the past she has had some mild edema.  She has been followed by Diana Campbell for her defibrillator.  Since I last saw her, she saw Diana Campbell in September 2018 for a defibrillator check.  ICD interrogation showed normal device function, normal lead parameters, no episodes of ventricular tachycardia and she was atrially pacing 86% of the time.  There was no ventricular pacing.  She had a favorable heart rate histogram and thoracic impedance was fairly stable but at times she had some evidence for fluid overload.  He denies any episodes of chest pain.  She admits to shortness of breath particularly when she bends over time she also notes some mild ankle swelling.  She complains of chronic low back pain for this reason she does not exercise.  She presents for reevaluation.  Past Medical History:  Diagnosis Date  . Arthritis   . Automatic implantable cardioverter-defibrillator in situ 07/2010  . Cardiac abnormality    with defibrilator  .  Chronic combined systolic and diastolic CHF (congestive heart failure) ([place]) 09/04/2015  . Diabetes mellitus   . H/O cardiac arrest 2006  . Hypercholesteremia   . Hypertension   . Neuropathy     Past Surgical History:  Procedure Laterality Date  . 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  Marland Kitchen CARDIAC CATHETERIZATION  04/14/2000   Normal coronary arteries.  Marland Kitchen CARDIAC DEFIBRILLATOR PLACEMENT  07/21/2010   Medtronic Protecta XT DR, model#[id number], serial#[id number]  . CARDIOVASCULAR STRESS TEST  04/05/2000   Scintigraphic evidence of mild LV dilatation and global LV dysfunction with an EF 41% with mild inferolateral ischemia.  Marland Kitchen CARPAL TUNNEL RELEASE Right 01/23/2016   Procedure: CARPAL TUNNEL RELEASE;  Surgeon: Carole Civil, MD;  Location: [facility];  Service: Orthopedics;  Laterality: Right;  pt knows to arrive at 6:15  . COLONOSCOPY  03/30/2011   Procedure: COLONOSCOPY;  Surgeon: Jamesetta So, MD;  Location: [facility];  Service: Gastroenterology;  Laterality: N/A;  . DORSAL COMPARTMENT RELEASE Right 08/11/2013   Procedure: RELEASE DORSAL COMPARTMENT (DEQUERVAIN);  Surgeon: Carole Civil, MD;  Location: [facility];  Service: Orthopedics;  Laterality: Right;  . TRANSTHORACIC ECHOCARDIOGRAM  08/03/2011   EF 45-50%, mildly reduced LV systolic function.    No Known Allergies  Current Outpatient Medications  Medication Sig Dispense Refill  . ACCU-CHEK AVIVA PLUS test strip USE TO TEST BLOOD SUGAR 4 TIMES A DAY (DX: E11.65) 150 each 5  . acetaminophen (  TYLENOL) 325 MG tablet Take 650 mg by mouth at bedtime as needed (pain).    Marland Kitchen aspirin 325 MG EC tablet Take 325 mg by mouth daily.    Marland Kitchen atorvastatin (LIPITOR) 10 MG tablet Take 10 mg by mouth at bedtime.     . calcium-vitamin D (OSCAL WITH D) 500-200 MG-UNIT per tablet Take 1 tablet by  mouth 2 (two) times daily.     . carvedilol (COREG) 12.5 MG tablet TAKE 0.5 TABLETS (6.25 MG TOTAL) BY MOUTH 2 (TWO) TIMES DAILY WITH A MEAL. 30 tablet 9  . Cholecalciferol (VITAMIN D PO) Take 1 tablet by mouth daily.    Marland Kitchen HUMALOG MIX 75/25 KWIKPEN (75-25) 100 UNIT/ML Kwikpen INJECT 40 UNITS UNDER THE SKIN TWICE A DAY 30 mL 2  . lisinopril (PRINIVIL,ZESTRIL) 5 MG tablet Take 1.5 tablets (7.5 mg total) by mouth daily. 135 tablet 3  . meloxicam (MOBIC) 15 MG tablet Take 15 mg by mouth daily.  11  . hydrochlorothiazide (MICROZIDE) 12.5 MG capsule Take 1 capsule (12.5 mg total) by mouth as needed (ankle swelling). 30 capsule 3   No current facility-administered medications for this visit.     Social History   Socioeconomic History  . Marital status: Married    Spouse name: Not on file  . Number of children: Not on file  . Years of education: Not on file  . Highest education level: Not on file  Social Needs  . Financial resource strain: Not on file  . Food insecurity - worry: Not on file  . Food insecurity - inability: Not on file  . Transportation needs - medical: Not on file  . Transportation needs - non-medical: Not on file  Occupational History  . Not on file  Tobacco Use  . Smoking status: Never Smoker  . Smokeless tobacco: Current User    Types: Snuff  . Tobacco comment: 3 dips per day  Substance and Sexual Activity  . Alcohol use: No  . Drug use: No  . Sexual activity: Yes    Birth control/protection: None  Other Topics Concern  . Not on file  Social History Narrative  . Not on file   Socially she is married and has 4 children. She has a new grand child.   Parents are deceased  ROS General: Negative; No fevers, chills, or night sweats; positive for fatigue HEENT: Negative; No changes in vision or hearing, sinus congestion, difficulty swallowing Pulmonary: Negative; No cough, wheezing, shortness of breath, hemoptysis Cardiovascular: see HPI GI: Negative; No  nausea, vomiting, diarrhea, or abdominal pain GU: Negative; No dysuria, hematuria, or difficulty voiding Musculoskeletal: Low back discomfort Hematologic/Oncology: Negative; no easy bruising, bleeding Endocrine: Positive for diabetes mellitus Neuro: Negative; no changes in balance, headaches Skin: Negative; No rashes or skin lesions Psychiatric: Negative; No behavioral problems, depression Sleep: Probable untreated sleep apnea with history of snoring, fatigue, frequent awakenings.  She did not undergo her recommended sleep study. Other comprehensive 14 point system review is negative.   PE BP (!) 147/67   Pulse 63   Ht 5' 7"  (1.702 m)   Wt 209 lb 6.4 oz (95 kg)   SpO2 97%   BMI 32.80 kg/m    Blood pressure by me was 140/70  Wt Readings from Last 3 Encounters:  03/30/17 209 lb 6.4 oz (95 kg)  12/15/16 203 lb (92.1 kg)  10/13/16 198 lb 3.2 oz (89.9 kg)   General: Alert, oriented, no distress.  Skin: normal turgor, no rashes, warm and  dry HEENT: Normocephalic, atraumatic. Pupils equal round and reactive to light; sclera anicteric; extraocular muscles intact;  Nose without nasal septal hypertrophy Mouth/Parynx benign; Mallinpatti scale 3 Neck: No JVD, no carotid bruits; normal carotid upstroke Lungs: clear to ausculatation and percussion; no wheezing or rales Chest wall: without tenderness to palpitation Heart: PMI not displaced, RRR, s1 s2 normal, 1/6 systolic murmur, no diastolic murmur, no rubs, gallops, thrills, or heaves Abdomen: Mild central adiposity soft, nontender; no hepatosplenomehaly, BS+; abdominal aorta nontender and not dilated by palpation. Back: no CVA tenderness Pulses 2+ Musculoskeletal: full range of motion, normal strength, no joint deformities Extremities: Trace bilateral ankle swelling, right greater than left no clubbing cyanosis, Homan's sign negative  Neurologic: grossly nonfocal; Cranial nerves grossly wnl Psychologic: Normal mood and affect  ECG  (independently read by me): Telemetry paced rhythm at 63 bpm.  PR interval 206 ms.  March 2017 ECG (independently read by me): Atrially paced rhythm at 61 bpm.  December 2014 ECG: Atrial lead paced rhythm at 79 beats per minute. PRWP.  Isolated PVC.  LABS:  BMP Latest Ref Rng & Units 11/24/2016 07/28/2016 04/14/2016  Glucose 65 - 99 mg/dL 139(H) 193(H) 266(H)  BUN 7 - 25 mg/dL 10 16 14   Creatinine 0.60 - 0.93 mg/dL 0.84 1.03(H) 0.94(H)  BUN/Creat Ratio 6 - 22 (calc) NOT APPLICABLE - -  Sodium 242 - 146 mmol/L 142 141 139  Potassium 3.5 - 5.3 mmol/L 4.3 4.6 4.2  Chloride 98 - 110 mmol/L 107 104 104  CO2 20 - 32 mmol/L 28 23 27   Calcium 8.6 - 10.4 mg/dL 9.2 9.4 9.2   Hepatic Function Latest Ref Rng & Units 07/28/2016 04/14/2016 12/30/2015  Total Protein 6.1 - 8.1 g/dL 6.6 6.2 6.2  Albumin 3.6 - 5.1 g/dL 3.8 3.5(L) 3.7  AST 10 - 35 U/L 11 13 14   ALT 6 - 29 U/L 9 11 9   Alk Phosphatase 33 - 130 U/L 73 67 66  Total Bilirubin 0.2 - 1.2 mg/dL 0.4 0.4 0.5   CBC Latest Ref Rng & Units 01/23/2016 05/06/2015 08/07/2013  WBC 4.0 - 10.5 K/uL 10.2 8.5 8.8  Hemoglobin 12.0 - 15.0 g/dL 12.3 11.0(L) 11.2(L)  Hematocrit 36.0 - 46.0 % 38.6 34.7(L) 35.1(L)  Platelets 150 - 400 K/uL 261 245 252   Lab Results  Component Value Date   MCV 80.1 01/23/2016   MCV 79.8 (L) 05/06/2015   MCV 78.9 08/07/2013   Lab Results  Component Value Date   TSH 0.80 11/24/2016   Lab Results  Component Value Date   HGBA1C 7.2 (H) 11/24/2016   Lipid Panel     Component Value Date/Time   CHOL 147 11/24/2016 0931   TRIG 75 11/24/2016 0931   HDL 67 11/24/2016 0931   CHOLHDL 2.2 11/24/2016 0931   VLDL 14 09/26/2015 0856   LDLCALC 77 09/26/2015 0856     RADIOLOGY: No results found.  IMPRESSION:  1. Cardiomyopathy, nonischemic ([place])   2. Medication management   3. Essential hypertension, benign   4. ICD (implantable cardioverter-defibrillator) in place   5. Type 2 diabetes mellitus with stage 2 chronic kidney  disease, with long-term current use of insulin ([facility])   6. Pure hypercholesterolemia   7. Mild obesity      ASSESSMENT AND PLAN: Diana Campbell is a 49 -year-old African-American female who is 14 years status post a ventricular fibrillation cardiac arrest at which time she was found to have an ejection fraction of 15% and felt to  have a nonischemic cardiomyopathy. She has a Nutritional therapist XT DR defibrillator in place. Her last echo Doppler study was done in July 2013 showed an ejection fraction of 45-50%.  Presently, she has been on lisinopril 5 mg and carvedilol 6.25 mg twice a day for hypertension.  Her blood pressure today is mildly elevated.  She also has admitted to some episodic ankle swelling.  I have suggesting slight titration of lisinopril up to 7.5 mg daily and I am giving her a prescription for HCTZ 12.5 mg to take on an as-needed basis.  Repeat be met will be obtained in 1-2 weeks to reassess renal function on this increased regimen.  I discussed with her target blood pressure ideally is 130 or less.  Since it is been 6 years since her last echo Doppler study, I am also scheduling her for follow-up echo Doppler evaluation.  She continues to be on atorvastatin for hyperlipidemia and recent lipid studies in November 2018 excellent with a total cholesterol 147, LDL 64.  She is atrially paced reference to her pacemaker.  She is not having any chest pain.  She does admit to some shortness of breath when she bends over but this may be contributed by pressure on her diaphragm.  She has not been active due to back discomfort as result has gained weight.  Her weight is increased from 198 up to 209 since her September 2018 evaluation with Diana Campbell.  Remotely, her ideal weight was felt to be 195.  We discussed the importance of weight loss improve diet.  She does not appear to be significantly volume overloaded presently.  He will be seeing Diana Campbell in follow-up in approximately 6 months.   Result I will see her in follow-up in 1 year so that she can be seen at six-month intervals.  She is diabetic on insulin.    Time spent: 25 minutes Troy Sine, MD, [facility]  03/30/2017 9:21 AM

## 2017-04-03 LAB — CUP PACEART REMOTE DEVICE CHECK
Battery Voltage: 2.63 V
Brady Statistic AP VP Percent: 0.03 %
Brady Statistic AP VS Percent: 86.71 %
Brady Statistic AS VP Percent: 0.01 %
Brady Statistic AS VS Percent: 13.26 %
Brady Statistic RA Percent Paced: 86.47 %
Brady Statistic RV Percent Paced: 0.03 %
Date Time Interrogation Session: 20190228192614
HighPow Impedance: 361 Ohm
HighPow Impedance: 42 Ohm
HighPow Impedance: 50 Ohm
Implantable Lead Implant Date: 20120702
Implantable Lead Implant Date: 20120702
Implantable Lead Location: 753859
Implantable Lead Location: 753860
Implantable Lead Model: 5076
Implantable Lead Model: 6947
Implantable Pulse Generator Implant Date: 20120702
Lead Channel Impedance Value: 475 Ohm
Lead Channel Impedance Value: 475 Ohm
Lead Channel Pacing Threshold Amplitude: 0.625 V
Lead Channel Pacing Threshold Amplitude: 0.875 V
Lead Channel Pacing Threshold Pulse Width: 0.4 ms
Lead Channel Pacing Threshold Pulse Width: 0.4 ms
Lead Channel Sensing Intrinsic Amplitude: 2.625 mV
Lead Channel Sensing Intrinsic Amplitude: 2.625 mV
Lead Channel Sensing Intrinsic Amplitude: 9.875 mV
Lead Channel Sensing Intrinsic Amplitude: 9.875 mV
Lead Channel Setting Pacing Amplitude: 1.75 V
Lead Channel Setting Pacing Amplitude: 2 V
Lead Channel Setting Pacing Pulse Width: 0.4 ms
Lead Channel Setting Sensing Sensitivity: 0.3 mV

## 2017-04-06 LAB — BASIC METABOLIC PANEL
BUN / CREAT RATIO: 12 (calc) (ref 6–22)
BUN: 12 mg/dL (ref 7–25)
CO2: 31 mmol/L (ref 20–32)
Calcium: 9.5 mg/dL (ref 8.6–10.4)
Chloride: 109 mmol/L (ref 98–110)
Creat: 0.97 mg/dL — ABNORMAL HIGH (ref 0.60–0.93)
GLUCOSE: 142 mg/dL — AB (ref 65–139)
POTASSIUM: 4.4 mmol/L (ref 3.5–5.3)
SODIUM: 143 mmol/L (ref 135–146)

## 2017-04-07 ENCOUNTER — Ambulatory Visit (HOSPITAL_COMMUNITY): Payer: Medicare Other | Attending: Cardiology

## 2017-04-07 ENCOUNTER — Other Ambulatory Visit: Payer: Self-pay

## 2017-04-07 DIAGNOSIS — E119 Type 2 diabetes mellitus without complications: Secondary | ICD-10-CM | POA: Insufficient documentation

## 2017-04-07 DIAGNOSIS — Z8674 Personal history of sudden cardiac arrest: Secondary | ICD-10-CM | POA: Diagnosis not present

## 2017-04-07 DIAGNOSIS — I428 Other cardiomyopathies: Secondary | ICD-10-CM | POA: Diagnosis not present

## 2017-04-07 DIAGNOSIS — I10 Essential (primary) hypertension: Secondary | ICD-10-CM | POA: Insufficient documentation

## 2017-05-24 ENCOUNTER — Other Ambulatory Visit: Payer: Self-pay | Admitting: Family Medicine

## 2017-05-24 DIAGNOSIS — Z1231 Encounter for screening mammogram for malignant neoplasm of breast: Secondary | ICD-10-CM

## 2017-06-08 LAB — COMPLETE METABOLIC PANEL WITH GFR
AG RATIO: 1.3 (calc) (ref 1.0–2.5)
ALBUMIN MSPROF: 3.8 g/dL (ref 3.6–5.1)
ALKALINE PHOSPHATASE (APISO): 73 U/L (ref 33–130)
ALT: 10 U/L (ref 6–29)
AST: 12 U/L (ref 10–35)
BILIRUBIN TOTAL: 0.3 mg/dL (ref 0.2–1.2)
BUN / CREAT RATIO: 17 (calc) (ref 6–22)
BUN: 17 mg/dL (ref 7–25)
CO2: 31 mmol/L (ref 20–32)
Calcium: 9.7 mg/dL (ref 8.6–10.4)
Chloride: 107 mmol/L (ref 98–110)
Creat: 0.98 mg/dL — ABNORMAL HIGH (ref 0.60–0.93)
GFR, Est African American: 66 mL/min/{1.73_m2} (ref >=60)
GFR, Est Non African American: 57 mL/min/{1.73_m2} — ABNORMAL LOW (ref >=60)
GLOBULIN: 3 g/dL (ref 1.9–3.7)
Glucose, Bld: 169 mg/dL — ABNORMAL HIGH (ref 65–99)
POTASSIUM: 4.5 mmol/L (ref 3.5–5.3)
SODIUM: 143 mmol/L (ref 135–146)
Total Protein: 6.8 g/dL (ref 6.1–8.1)

## 2017-06-08 LAB — HEMOGLOBIN A1C
HEMOGLOBIN A1C: 8.2 %{Hb} — AB (ref <5.7)
Mean Plasma Glucose: 189 (calc)
eAG (mmol/L): 10.4 (calc)

## 2017-06-16 ENCOUNTER — Encounter: Payer: Self-pay | Admitting: "Endocrinology

## 2017-06-16 ENCOUNTER — Ambulatory Visit (INDEPENDENT_AMBULATORY_CARE_PROVIDER_SITE_OTHER): Payer: Medicare Other | Admitting: "Endocrinology

## 2017-06-16 VITALS — BP 123/67 | HR 80 | Ht 67.0 in | Wt 207.0 lb

## 2017-06-16 DIAGNOSIS — I10 Essential (primary) hypertension: Secondary | ICD-10-CM | POA: Diagnosis not present

## 2017-06-16 DIAGNOSIS — E782 Mixed hyperlipidemia: Secondary | ICD-10-CM | POA: Diagnosis not present

## 2017-06-16 DIAGNOSIS — Z794 Long term (current) use of insulin: Secondary | ICD-10-CM

## 2017-06-16 DIAGNOSIS — E1165 Type 2 diabetes mellitus with hyperglycemia: Secondary | ICD-10-CM

## 2017-06-16 DIAGNOSIS — E118 Type 2 diabetes mellitus with unspecified complications: Secondary | ICD-10-CM | POA: Diagnosis not present

## 2017-06-16 DIAGNOSIS — IMO0002 Reserved for concepts with insufficient information to code with codable children: Secondary | ICD-10-CM

## 2017-06-16 MED ORDER — INSULIN LISPRO PROT & LISPRO (75-25 MIX) 100 UNIT/ML KWIKPEN: PEN_INJECTOR | SUBCUTANEOUS | 2 refills | Status: DC

## 2017-06-16 NOTE — Progress Notes (Signed)
Subjective:    Patient ID: Diana Campbell, female    DOB: 1945/09/17, PCP Salley Scarlet, MD   Past Medical History:  Diagnosis Date  . Arthritis   . Automatic implantable cardioverter-defibrillator in situ 07/2010  . Cardiac abnormality    with defibrilator  . Chronic combined systolic and diastolic CHF (congestive heart failure) (HCC) 09/04/2015  . Diabetes mellitus   . H/O cardiac arrest 2006  . Hypercholesteremia   . Hypertension   . Neuropathy    Past Surgical History:  Procedure Laterality Date  . 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  Marland Kitchen CARDIAC CATHETERIZATION  04/14/2000   Normal coronary arteries.  Marland Kitchen CARDIAC DEFIBRILLATOR PLACEMENT  07/21/2010   Medtronic Protecta XT DR, model#[id number], serial#[id number]  . CARDIOVASCULAR STRESS TEST  04/05/2000   Scintigraphic evidence of mild LV dilatation and global LV dysfunction with an EF 41% with mild inferolateral ischemia.  Marland Kitchen CARPAL TUNNEL RELEASE Right 01/23/2016   Procedure: CARPAL TUNNEL RELEASE;  Surgeon: Vickki Hearing, MD;  Location: [facility];  Service: Orthopedics;  Laterality: Right;  pt knows to arrive at 6:15  . COLONOSCOPY  03/30/2011   Procedure: COLONOSCOPY;  Surgeon: Dalia Heading, MD;  Location: [facility];  Service: Gastroenterology;  Laterality: N/A;  . DORSAL COMPARTMENT RELEASE Right 08/11/2013   Procedure: RELEASE DORSAL COMPARTMENT (DEQUERVAIN);  Surgeon: Vickki Hearing, MD;  Location: [facility];  Service: Orthopedics;  Laterality: Right;  . TRANSTHORACIC ECHOCARDIOGRAM  08/03/2011   EF 45-50%, mildly reduced LV systolic function.   Social History   Socioeconomic History  . Marital status: Married    Spouse name: Not on file  . Number of children: Not on file  . Years of education: Not on file  . Highest education level: Not on file   Occupational History  . Not on file  Social Needs  . Financial resource strain: Not on file  . Food insecurity:    Worry: Not on file    Inability: Not on file  . Transportation needs:    Medical: Not on file    Non-medical: Not on file  Tobacco Use  . Smoking status: Never Smoker  . Smokeless tobacco: Current User    Types: Snuff  . Tobacco comment: 3 dips per day  Substance and Sexual Activity  . Alcohol use: No  . Drug use: No  . Sexual activity: Yes    Birth control/protection: None  Lifestyle  . Physical activity:    Days per week: Not on file    Minutes per session: Not on file  . Stress: Not on file  Relationships  . Social connections:    Talks on phone: Not on file    Gets together: Not on file    Attends religious service: Not on file    Active member of club or organization: Not on file    Attends meetings of clubs or organizations: Not on file    Relationship status: Not on file  Other Topics Concern  . Not on file  Social History Narrative  . Not on file   Outpatient Encounter Medications as of 06/16/2017  Medication Sig  . ACCU-CHEK AVIVA PLUS test strip USE TO TEST BLOOD SUGAR 4 TIMES A DAY (DX: E11.65)  . acetaminophen (TYLENOL) 325 MG tablet Take 650 mg by mouth at bedtime as needed (pain).  Marland Kitchen aspirin  325 MG EC tablet Take 325 mg by mouth daily.  Marland Kitchen atorvastatin (LIPITOR) 10 MG tablet Take 10 mg by mouth at bedtime.   . calcium-vitamin D (OSCAL WITH D) 500-200 MG-UNIT per tablet Take 1 tablet by mouth 2 (two) times daily.   . carvedilol (COREG) 12.5 MG tablet TAKE 0.5 TABLETS (6.25 MG TOTAL) BY MOUTH 2 (TWO) TIMES DAILY WITH A MEAL.  Marland Kitchen Cholecalciferol (VITAMIN D PO) Take 1 tablet by mouth daily.  . hydrochlorothiazide (MICROZIDE) 12.5 MG capsule Take 1 capsule (12.5 mg total) by mouth as needed (ankle swelling).  . Insulin Lispro Prot & Lispro (HUMALOG MIX 75/25 KWIKPEN) (75-25) 100 UNIT/ML Kwikpen INJECT 40 UNITS UNDER THE SKIN AT BREAKFAST AND 35  UNITS WITH SUPPER WHEN GLUCOSE IS ABOVE 90.  . lisinopril (PRINIVIL,ZESTRIL) 5 MG tablet Take 1.5 tablets (7.5 mg total) by mouth daily.  . meloxicam (MOBIC) 15 MG tablet Take 15 mg by mouth daily.  . [DISCONTINUED] HUMALOG MIX 75/25 KWIKPEN (75-25) 100 UNIT/ML Kwikpen INJECT 40 UNITS UNDER THE SKIN TWICE A DAY (Patient taking differently: INJECT 30UNITS in the a.m. & 40 units at night UNDER THE SKIN TWICE A DAY)   No facility-administered encounter medications on file as of 06/16/2017.    ALLERGIES: No Known Allergies VACCINATION STATUS:  There is no immunization history on file for this patient.  Diabetes She presents for her follow-up diabetic visit. She has type 2 diabetes mellitus. Onset time: She was diagnosed at approximate age of 42 years. Her disease course has been worsening. There are no hypoglycemic associated symptoms. Pertinent negatives for hypoglycemia include no confusion, headaches, pallor or seizures. There are no diabetic associated symptoms. Pertinent negatives for diabetes include no chest pain, no polydipsia, no polyphagia and no polyuria. There are no hypoglycemic complications. Symptoms are worsening. Diabetic complications include nephropathy. Risk factors for coronary artery disease include diabetes mellitus, dyslipidemia, hypertension, sedentary lifestyle and tobacco exposure. Current diabetic treatment includes intensive insulin program .   She has not complied very well with her medications this time.  Came with no meter nor logs to review.  Her A1c is 8.2% increasing from 7.2%.    Her weight is stable. She is following a generally unhealthy diet. She has not had a previous visit with a dietitian (She declined a referral to CDE.)  An ACE inhibitor/angiotensin II receptor blocker is being taken. Eye exam is current.  Hyperlipidemia This is a chronic problem. The current episode started more than 1 year ago. Pertinent negatives include no chest pain, myalgias or  shortness of breath. Current antihyperlipidemic treatment includes statins. Risk factors for coronary artery disease include dyslipidemia, diabetes mellitus and a sedentary lifestyle.  Hypertension This is a chronic problem. The current episode started more than 1 year ago. The problem is controlled. Pertinent negatives include no chest pain, headaches, palpitations or shortness of breath. Past treatments include ACE inhibitors. Hypertensive end-organ damage includes kidney disease.     Review of Systems  Constitutional: Negative for unexpected weight change.  HENT: Negative for trouble swallowing and voice change.    Eyes: Negative for visual disturbance.  Respiratory: Negative for cough, shortness of breath and wheezing.   Cardiovascular: For chest pain, palpitations, leg swelling.    Gastrointestinal: Negative for nausea, vomiting and diarrhea.  Endocrine: Negative for cold intolerance, heat intolerance, polydipsia, polyphagia and polyuria.  Musculoskeletal: Negative for myalgias and arthralgias.  Skin: Negative for color change, pallor, rash and wound.  Neurological: Negative for seizures and headaches.  Psychiatric/Behavioral:  Negative for suicidal ideas and confusion.    Objective:    BP 123/67   Pulse 80   Ht 5\' 7"  (1.702 m)   Wt 207 lb (93.9 kg)   BMI 32.42 kg/m   Wt Readings from Last 3 Encounters:  06/16/17 207 lb (93.9 kg)  03/30/17 209 lb 6.4 oz (95 kg)  12/15/16 203 lb (92.1 kg)    Physical Exam  Constitutional: She is oriented to person, place, and time. She appears well-developed.  HENT:  Head: Normocephalic and atraumatic.  Eyes: EOM are normal.  Neck: Normal range of motion. Neck supple. No tracheal deviation present. No thyromegaly present.   Musculoskeletal: Normal range of motion. She exhibits no edema.  Neurological: She is alert and oriented to person, place, and time. She has normal reflexes. No cranial nerve deficit. Coordination normal.  Skin: Skin  is warm and dry. No rash noted. No erythema. No pallor.  Psychiatric: She has a reluctant affect.      Results for orders placed or performed in visit on 03/30/17  Basic metabolic panel  Result Value Ref Range   Glucose, Bld 142 (H) 65 - 139 mg/dL   BUN 12 7 - 25 mg/dL   Creat 4.08 (H) 1.44 - 0.93 mg/dL   BUN/Creatinine Ratio 12 6 - 22 (calc)   Sodium 143 135 - 146 mmol/L   Potassium 4.4 3.5 - 5.3 mmol/L   Chloride 109 98 - 110 mmol/L   CO2 31 20 - 32 mmol/L   Calcium 9.5 8.6 - 10.4 mg/dL  Diabetic Labs (most recent): Lab Results  Component Value Date   HGBA1C 8.2 (H) 06/07/2017   HGBA1C 7.2 (H) 11/24/2016   HGBA1C 9.4 (H) 07/28/2016   Lipid Panel     Component Value Date/Time   CHOL 147 11/24/2016 0931   TRIG 75 11/24/2016 0931   HDL 67 11/24/2016 0931   CHOLHDL 2.2 11/24/2016 0931   VLDL 14 09/26/2015 0856   LDLCALC 64 11/24/2016 0931     Assessment & Plan:   1. Type 2 diabetes mellitus with long-term current use of insulin (HCC)  -Her diabetes is complicated by cardiomyopathy and she remains at a high risk for more acute and chronic complications of diabetes which include CAD, CVA, CKD, retinopathy, and neuropathy. These are all discussed in detail with the patient.  Patient came with out her meter no logs to review.  Her A1c is higher at 8.2% from 7.2%.    - Glucose logs and insulin administration records pertaining to this visit,  to be scanned into patient's records.  Recent labs reviewed.   - I have re-counseled the patient on diet management and weight loss  by adopting a carbohydrate restricted / protein rich  Diet.  -  Suggestion is made for her to avoid simple carbohydrates  from her diet including Cakes, Sweet Desserts / Pastries, Ice Cream, Soda (diet and regular), Sweet Tea, Candies, Chips, Cookies, Store Bought Juices, Alcohol in Excess of  1-2 drinks a day, Artificial Sweeteners, and "Sugar-free" Products. This will help patient to have stable blood  glucose profile and potentially avoid unintended weight gain.  - Patient is advised to stick to a routine mealtimes to eat 3 meals  a day and avoid unnecessary snacks ( to snack only to correct hypoglycemia).  - The patient  declined referral to a  CDE for individualized DM education.  - I have approached patient with the following individualized plan to manage diabetes and patient agrees.   -  Patient  Worries a lot about hypoglycemia, she changed around her insulin doses a lot.  He did not bring his meter nor logs to review today.  He does not report any hypoglycemia at this time.  -  She wants to avoid lowering blood glucose to below 100.  I advised her to resume her original insulin program, Humalog 75/25 40 units with breakfast and 35 units with supper for pre-meal blood glucose readings above 90 mg/dL, associated with monitoring of blood glucose at least 2 times a day- before breakfast and before supper and as needed anytime.    -Patient is encouraged to call clinic for blood glucose levels less than 70 or above 200 mg /dl.  -Her labs show her renal function is improving. She would have benefited from metformin however she reports intolerance to metformin.   - Patient specific target  for A1c; LDL, HDL, Triglycerides, and  Waist Circumference were discussed in detail.  2) BP/HTN: Her blood pressure is controlled to target.  She is advised to continue her current blood pressure medications including controlled.  I advised her to continue current medications including lisinopril 7.5 mg p.o. daily.    3) Lipids/HPL: Recent lipid panel showed controlled LDL.  She is advised to continue atorvastatin 10 mg p.o. nightly.   4)  Weight/Diet: She declined CDE consult . exercise, and carbohydrates information provided.  5) Chronic Care/Health Maintenance:  -Patient is on ACEI/ARB and Statin medications and encouraged to continue to follow up with Ophthalmology, Podiatrist at least yearly or  according to recommendations, and advised to  stay away from smoking. I have recommended yearly flu vaccine and pneumonia vaccination at least every 5 years; moderate intensity exercise for up to 150 minutes weekly; and  sleep for at least 7 hours a day.   - I advised patient to maintain close follow up with Salley Scarlet, MD for primary care needs.  - Time spent with the patient: 25 min, of which >50% was spent in reviewing her blood glucose logs , discussing her hypo- and hyper-glycemic episodes, reviewing her current and  previous labs and insulin doses and developing a plan to avoid hypo- and hyper-glycemia. Please refer to Patient Instructions for Blood Glucose Monitoring and Insulin/Medications Dosing Guide"  in media tab for additional information. Keenya Jana Half participated in the discussions, expressed understanding, and voiced agreement with the above plans.  All questions were answered to her satisfaction. she is encouraged to contact clinic should she have any questions or concerns prior to her return visit.   Follow up plan: -Return in about 3 months (around 09/16/2017) for follow up with pre-visit labs, meter, and logs.  Marquis Lunch, MD Phone: [phone]  Fax: [phone]  -  This note was partially dictated with voice recognition software. Similar sounding words can be transcribed inadequately or may not  be corrected upon review.  06/16/2017, 1:09 PM

## 2017-06-16 NOTE — Patient Instructions (Signed)

## 2017-06-17 ENCOUNTER — Ambulatory Visit (INDEPENDENT_AMBULATORY_CARE_PROVIDER_SITE_OTHER): Payer: Medicare Other | Admitting: *Deleted

## 2017-06-17 DIAGNOSIS — I428 Other cardiomyopathies: Secondary | ICD-10-CM

## 2017-06-17 DIAGNOSIS — I10 Essential (primary) hypertension: Secondary | ICD-10-CM

## 2017-06-17 NOTE — Progress Notes (Signed)
Remote ICD transmission.   

## 2017-06-21 ENCOUNTER — Encounter: Payer: Self-pay | Admitting: Cardiology

## 2017-06-22 LAB — CUP PACEART REMOTE DEVICE CHECK
Brady Statistic AP VP Percent: 0.03 %
Brady Statistic AS VP Percent: 0.01 %
Brady Statistic RA Percent Paced: 88.67 %
Brady Statistic RV Percent Paced: 0.03 %
Date Time Interrogation Session: 20190530173731
HIGH POWER IMPEDANCE MEASURED VALUE: 361 Ohm
HIGH POWER IMPEDANCE MEASURED VALUE: 43 Ohm
HIGH POWER IMPEDANCE MEASURED VALUE: 55 Ohm
Implantable Lead Implant Date: 20120702
Implantable Lead Location: 753860
Implantable Lead Model: 5076
Lead Channel Impedance Value: 456 Ohm
Lead Channel Pacing Threshold Amplitude: 0.75 V
Lead Channel Sensing Intrinsic Amplitude: 9.375 mV
Lead Channel Sensing Intrinsic Amplitude: 9.375 mV
Lead Channel Setting Pacing Amplitude: 2 V
Lead Channel Setting Pacing Pulse Width: 0.4 ms
Lead Channel Setting Sensing Sensitivity: 0.3 mV
MDC IDC LEAD IMPLANT DT: 20120702
MDC IDC LEAD LOCATION: 753859
MDC IDC MSMT BATTERY VOLTAGE: 2.6 V
MDC IDC MSMT LEADCHNL RA IMPEDANCE VALUE: 456 Ohm
MDC IDC MSMT LEADCHNL RA PACING THRESHOLD AMPLITUDE: 0.875 V
MDC IDC MSMT LEADCHNL RA PACING THRESHOLD PULSEWIDTH: 0.4 ms
MDC IDC MSMT LEADCHNL RA SENSING INTR AMPL: 2.625 mV
MDC IDC MSMT LEADCHNL RA SENSING INTR AMPL: 2.625 mV
MDC IDC MSMT LEADCHNL RV PACING THRESHOLD PULSEWIDTH: 0.4 ms
MDC IDC PG IMPLANT DT: 20120702
MDC IDC SET LEADCHNL RV PACING AMPLITUDE: 2 V
MDC IDC STAT BRADY AP VS PERCENT: 88.88 %
MDC IDC STAT BRADY AS VS PERCENT: 11.09 %

## 2017-07-16 ENCOUNTER — Other Ambulatory Visit: Payer: Self-pay | Admitting: "Endocrinology

## 2017-08-30 ENCOUNTER — Ambulatory Visit: Payer: Medicare Other

## 2017-09-07 ENCOUNTER — Other Ambulatory Visit: Payer: Self-pay

## 2017-09-07 MED ORDER — ACCU-CHEK FASTCLIX LANCETS MISC: 1.0000 | Freq: Two times a day (BID) | 5 refills | Status: AC

## 2017-09-07 MED ORDER — ACCU-CHEK FASTCLIX LANCET KIT: 1.0000 | PACK | Freq: Two times a day (BID) | 2 refills | Status: DC

## 2017-09-13 ENCOUNTER — Telehealth: Payer: Self-pay

## 2017-09-13 NOTE — Telephone Encounter (Signed)
Spoke with pt informed her that her device had reached ERI and to make sure she kept her apt with [facility] on 9/26, pt voiced understanding. Informed pt that if the the alert tone from her device got on her nerves we could bring her in and turn on the alert tone but she would still need to keep her apt with [facility] on 10/14/17 pt voiced understanding and didn't bother her.

## 2017-09-13 NOTE — Telephone Encounter (Signed)
LVM for pt to call device clinic back device at [facility].

## 2017-09-16 ENCOUNTER — Ambulatory Visit (INDEPENDENT_AMBULATORY_CARE_PROVIDER_SITE_OTHER): Payer: Medicare Other | Admitting: *Deleted

## 2017-09-16 DIAGNOSIS — I428 Other cardiomyopathies: Secondary | ICD-10-CM | POA: Diagnosis not present

## 2017-09-16 LAB — COMPLETE METABOLIC PANEL WITH GFR
AG RATIO: 1.4 (calc) (ref 1.0–2.5)
ALBUMIN MSPROF: 3.8 g/dL (ref 3.6–5.1)
ALT: 7 U/L (ref 6–29)
AST: 11 U/L (ref 10–35)
Alkaline phosphatase (APISO): 65 U/L (ref 33–130)
BUN: 12 mg/dL (ref 7–25)
CALCIUM: 9.2 mg/dL (ref 8.6–10.4)
CO2: 30 mmol/L (ref 20–32)
Chloride: 106 mmol/L (ref 98–110)
Creat: 0.92 mg/dL (ref 0.60–0.93)
GFR, EST AFRICAN AMERICAN: 72 mL/min/{1.73_m2} (ref >=60)
GFR, EST NON AFRICAN AMERICAN: 62 mL/min/{1.73_m2} (ref >=60)
GLOBULIN: 2.8 g/dL (ref 1.9–3.7)
Glucose, Bld: 145 mg/dL — ABNORMAL HIGH (ref 65–99)
POTASSIUM: 4.2 mmol/L (ref 3.5–5.3)
SODIUM: 142 mmol/L (ref 135–146)
TOTAL PROTEIN: 6.6 g/dL (ref 6.1–8.1)
Total Bilirubin: 0.4 mg/dL (ref 0.2–1.2)

## 2017-09-16 LAB — HEMOGLOBIN A1C
Hgb A1c MFr Bld: 8.3 % of total Hgb — ABNORMAL HIGH (ref <5.7)
MEAN PLASMA GLUCOSE: 192 (calc)
eAG (mmol/L): 10.6 (calc)

## 2017-09-16 NOTE — Progress Notes (Signed)
Remote ICD transmission.   

## 2017-09-17 ENCOUNTER — Telehealth: Payer: Self-pay | Admitting: Cardiovascular Disease

## 2017-09-17 ENCOUNTER — Telehealth: Payer: Self-pay

## 2017-09-17 NOTE — Telephone Encounter (Signed)
Did not need this encounter °

## 2017-09-17 NOTE — Telephone Encounter (Signed)
Pt states that her ICD been going off since Sunday and it starting to bother her. Pt states she feels fine.

## 2017-09-17 NOTE — Telephone Encounter (Signed)
Spoke to patient about her audible alert tone that she's been hearing since Sunday. I informed patient that she's hearing the tone bc her device is at [facility]. I explained to patient that she will continue to hear the tone qdaily until she's checked in the office. I told patient that the sound will not hurt her or the device. I offered patient an appointment for either today or next week to have the tone disabled. Patient states that she would prefer to wait until her appt with Dr.Croitoru next month. I instructed patient to call back if she changes her mind. Patient verbalized understanding.

## 2017-09-21 ENCOUNTER — Ambulatory Visit (HOSPITAL_COMMUNITY)
Admission: RE | Admit: 2017-09-21 | Discharge: 2017-09-21 | Disposition: A | Discharge status: Home / Self Care | Payer: Medicare Other | Source: Ambulatory Visit | Attending: Physician Assistant | Admitting: Physician Assistant

## 2017-09-21 ENCOUNTER — Other Ambulatory Visit (HOSPITAL_COMMUNITY): Payer: Self-pay | Admitting: Physician Assistant

## 2017-09-21 DIAGNOSIS — M25552 Pain in left hip: Secondary | ICD-10-CM | POA: Diagnosis present

## 2017-09-21 DIAGNOSIS — M25562 Pain in left knee: Secondary | ICD-10-CM

## 2017-09-22 ENCOUNTER — Ambulatory Visit: Payer: Medicare Other | Admitting: "Endocrinology

## 2017-09-22 ENCOUNTER — Encounter: Payer: Self-pay | Admitting: "Endocrinology

## 2017-09-22 VITALS — BP 122/67 | HR 80 | Ht 67.0 in | Wt 207.0 lb

## 2017-09-22 DIAGNOSIS — I10 Essential (primary) hypertension: Secondary | ICD-10-CM

## 2017-09-22 DIAGNOSIS — E782 Mixed hyperlipidemia: Secondary | ICD-10-CM

## 2017-09-22 DIAGNOSIS — E118 Type 2 diabetes mellitus with unspecified complications: Secondary | ICD-10-CM | POA: Diagnosis not present

## 2017-09-22 DIAGNOSIS — Z794 Long term (current) use of insulin: Secondary | ICD-10-CM

## 2017-09-22 DIAGNOSIS — E1165 Type 2 diabetes mellitus with hyperglycemia: Secondary | ICD-10-CM

## 2017-09-22 DIAGNOSIS — IMO0002 Reserved for concepts with insufficient information to code with codable children: Secondary | ICD-10-CM

## 2017-09-22 NOTE — Progress Notes (Signed)
Endocrinology follow-up note   Subjective:    Patient ID: Diana Campbell, female    DOB: 09-15-1945, PCP Diana Rossetti, Diana Campbell   Past Medical History:  Diagnosis Date  . Arthritis   . Automatic implantable cardioverter-defibrillator in situ 07/2010  . Cardiac abnormality    with defibrilator  . Chronic combined systolic and diastolic CHF (congestive heart failure) ([place]) 09/04/2015  . Diabetes mellitus   . H/O cardiac arrest 2006  . Hypercholesteremia   . Hypertension   . Neuropathy    Past Surgical History:  Procedure Laterality Date  . 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  Marland Kitchen CARDIAC CATHETERIZATION  04/14/2000   Normal coronary arteries.  Marland Kitchen CARDIAC DEFIBRILLATOR PLACEMENT  07/21/2010   Medtronic Protecta XT DR, model#[id number], serial#[id number]  . CARDIOVASCULAR STRESS TEST  04/05/2000   Scintigraphic evidence of mild LV dilatation and global LV dysfunction with an EF 41% with mild inferolateral ischemia.  Marland Kitchen CARPAL TUNNEL RELEASE Right 01/23/2016   Procedure: CARPAL TUNNEL RELEASE;  Surgeon: Carole Civil, Diana Campbell;  Location: [facility];  Service: Orthopedics;  Laterality: Right;  pt knows to arrive at 6:15  . COLONOSCOPY  03/30/2011   Procedure: COLONOSCOPY;  Surgeon: Jamesetta So, Diana Campbell;  Location: [facility];  Service: Gastroenterology;  Laterality: N/A;  . DORSAL COMPARTMENT RELEASE Right 08/11/2013   Procedure: RELEASE DORSAL COMPARTMENT (DEQUERVAIN);  Surgeon: Carole Civil, Diana Campbell;  Location: [facility];  Service: Orthopedics;  Laterality: Right;  . TRANSTHORACIC ECHOCARDIOGRAM  08/03/2011   EF 45-50%, mildly reduced LV systolic function.   Social History   Socioeconomic History  . Marital status: Married    Spouse name: Not on file  . Number of children: Not on file  . Years of education: Not on file  . Highest  education level: Not on file  Occupational History  . Not on file  Social Needs  . Financial resource strain: Not on file  . Food insecurity:    Worry: Not on file    Inability: Not on file  . Transportation needs:    Medical: Not on file    Non-medical: Not on file  Tobacco Use  . Smoking status: Never Smoker  . Smokeless tobacco: Current User    Types: Snuff  . Tobacco comment: 3 dips per day  Substance and Sexual Activity  . Alcohol use: No  . Drug use: No  . Sexual activity: Yes    Birth control/protection: None  Lifestyle  . Physical activity:    Days per week: Not on file    Minutes per session: Not on file  . Stress: Not on file  Relationships  . Social connections:    Talks on phone: Not on file    Gets together: Not on file    Attends religious service: Not on file    Active member of club or organization: Not on file    Attends meetings of clubs or organizations: Not on file    Relationship status: Not on file  Other Topics Concern  . Not on file  Social History Narrative  . Not on file   Outpatient Encounter Medications as of 09/22/2017  Medication Sig  . Insulin Lispro Prot & Lispro (HUMALOG MIX 75/25 KWIKPEN [place]) Inject 35-40 Units into the skin 2 (two) times daily before a meal.  . ACCU-CHEK AVIVA PLUS test strip USE TO  TEST BLOOD SUGAR 4 TIMES A DAY (DX: E11.65)  . ACCU-CHEK FASTCLIX LANCETS MISC 1 each by Does not apply route 2 (two) times daily.  Marland Kitchen acetaminophen (TYLENOL) 325 MG tablet Take 650 mg by mouth at bedtime as needed (pain).  Marland Kitchen aspirin 325 MG EC tablet Take 325 mg by mouth daily.  Marland Kitchen atorvastatin (LIPITOR) 10 MG tablet Take 10 mg by mouth at bedtime.   . calcium-vitamin D (OSCAL WITH D) 500-200 MG-UNIT per tablet Take 1 tablet by mouth 2 (two) times daily.   . carvedilol (COREG) 12.5 MG tablet TAKE 0.5 TABLETS (6.25 MG TOTAL) BY MOUTH 2 (TWO) TIMES DAILY WITH A MEAL.  Marland Kitchen Cholecalciferol (VITAMIN D PO) Take 1 tablet by mouth daily.  .  hydrochlorothiazide (MICROZIDE) 12.5 MG capsule Take 1 capsule (12.5 mg total) by mouth as needed (ankle swelling).  . Lancets Misc. (ACCU-CHEK FASTCLIX LANCET) KIT 1 each by Does not apply route 2 (two) times daily.  Marland Kitchen lisinopril (PRINIVIL,ZESTRIL) 5 MG tablet Take 1.5 tablets (7.5 mg total) by mouth daily.  . meloxicam (MOBIC) 15 MG tablet Take 15 mg by mouth daily.   No facility-administered encounter medications on file as of 09/22/2017.    ALLERGIES: No Known Allergies VACCINATION STATUS:  There is no immunization history on file for this patient.  Diabetes She presents for her follow-up diabetic visit. She has type 2 diabetes mellitus. Onset time: She was diagnosed at approximate age of 73 years. Her disease course has been worsening. There are no hypoglycemic associated symptoms. Pertinent negatives for hypoglycemia include no confusion, headaches, pallor or seizures. There are no diabetic associated symptoms. Pertinent negatives for diabetes include no chest pain, no polydipsia, no polyphagia and no polyuria. There are no hypoglycemic complications. Symptoms are worsening. Diabetic complications include nephropathy. Risk factors for coronary artery disease include diabetes mellitus, dyslipidemia, hypertension, sedentary lifestyle and tobacco exposure. Current diabetic treatment includes intensive insulin program .   She has been changing around the doses of her insulin against the recommendations.  Her A1c is 8.3%, average blood glucose of 165 in her meter for the last 30 days.    Her weight is stable. She is following a generally unhealthy diet, slowly eats "junk food".  She has not had a previous visit with a dietitian (She declined a referral to CDE.)   An ACE inhibitor/angiotensin II receptor blocker is being taken. Eye exam is current.    Hyperlipidemia This is a chronic problem. The current episode started more than 1 year ago. Pertinent negatives include no chest pain, myalgias or  shortness of breath. Current antihyperlipidemic treatment includes statins. Risk factors for coronary artery disease include dyslipidemia, diabetes mellitus and a sedentary lifestyle.  Hypertension This is a chronic problem. The current episode started more than 1 year ago. The problem is controlled. Pertinent negatives include no chest pain, headaches, palpitations or shortness of breath. Past treatments include ACE inhibitors. Hypertensive end-organ damage includes kidney disease.     Review of Systems  Constitutional: Negative for unexpected weight change.  HENT: Negative for trouble swallowing and voice change.    Eyes: Negative for visual disturbance.  Respiratory: Negative for cough, shortness of breath and wheezing.   Cardiovascular: For chest pain, palpitations, leg swelling.    Gastrointestinal: Negative for nausea, vomiting and diarrhea.  Endocrine: Negative for cold intolerance, heat intolerance, polydipsia, polyphagia and polyuria.  Musculoskeletal: She fell and sustained injury to her left lower extremity status post x-ray with no evidence of fracture.  She  walks with a cane. Neurological: Negative for seizures and headaches.  Psychiatric/Behavioral: Negative for suicidal ideas and confusion.    Objective:    BP 122/67   Pulse 80   Ht 5' 7"  (1.702 m)   Wt 207 lb (93.9 kg)   BMI 32.42 kg/m   Wt Readings from Last 3 Encounters:  09/22/17 207 lb (93.9 kg)  06/16/17 207 lb (93.9 kg)  03/30/17 209 lb 6.4 oz (95 kg)    Physical Exam  Constitutional: She is oriented to person, place, and time. She appears well-developed.  Walks with a cane. HENT:  Head: Normocephalic and atraumatic.  Eyes: EOM are normal.  Neck: Normal range of motion. Neck supple. No tracheal deviation present. No thyromegaly present.   Musculoskeletal:  She exhibits no edema. Small area of bruise on left total from her fall yesterday.  She had x-ray of hip and knee with no fractures. Neurological: She  is alert and oriented to person, place, and time. She has normal reflexes. No cranial nerve deficit. Coordination normal.  Skin: Skin is warm and dry. No rash noted. No erythema. No pallor.  Psychiatric: She has a reluctant affect.      CMP Latest Ref Rng & Units 09/15/2017 06/07/2017 04/06/2017  Glucose 65 - 99 mg/dL 145(H) 169(H) 142(H)  BUN 7 - 25 mg/dL 12 17 12   Creatinine 0.60 - 0.93 mg/dL 0.92 0.98(H) 0.97(H)  Sodium 135 - 146 mmol/L 142 143 143  Potassium 3.5 - 5.3 mmol/L 4.2 4.5 4.4  Chloride 98 - 110 mmol/L 106 107 109  CO2 20 - 32 mmol/L 30 31 31   Calcium 8.6 - 10.4 mg/dL 9.2 9.7 9.5  Total Protein 6.1 - 8.1 g/dL 6.6 6.8 -  Total Bilirubin 0.2 - 1.2 mg/dL 0.4 0.3 -  Alkaline Phos 33 - 130 U/L - - -  AST 10 - 35 U/L 11 12 -  ALT 6 - 29 U/L 7 10 -    Lab Results  Component Value Date   HGBA1C 8.3 (H) 09/15/2017   HGBA1C 8.2 (H) 06/07/2017   HGBA1C 7.2 (H) 11/24/2016   Lipid Panel     Component Value Date/Time   CHOL 147 11/24/2016 0931   TRIG 75 11/24/2016 0931   HDL 67 11/24/2016 0931   CHOLHDL 2.2 11/24/2016 0931   VLDL 14 09/26/2015 0856   LDLCALC 64 11/24/2016 0931     Assessment & Plan:   1. Type 2 diabetes mellitus with long-term current use of insulin ([facility]CC)  -Her diabetes is complicated by cardiomyopathy and she remains at a high risk for more acute and chronic complications of diabetes which include CAD, CVA, CKD, retinopathy, and neuropathy. These are all discussed in detail with the patient.  Patient came with her meter showing average blood glucose of 165 over the last 30 days, A1c of 8.3%.    - Glucose logs and insulin administration records pertaining to this visit,  to be scanned into patient's records.  Recent labs reviewed.   - I have re-counseled the patient on diet management and weight loss  by adopting a carbohydrate restricted / protein rich  Diet.  -  Suggestion is made for her to avoid simple carbohydrates  from her diet including Cakes,  Sweet Desserts / Pastries, Ice Cream, Soda (diet and regular), Sweet Tea, Candies, Chips, Cookies, Store Bought Juices, Alcohol in Excess of  1-2 drinks a day, Artificial Sweeteners, and "Sugar-free" Products. This will help patient to have stable blood glucose profile and  potentially avoid unintended weight gain.  - Patient is advised to stick to a routine mealtimes to eat 3 meals  a day and avoid unnecessary snacks ( to snack only to correct hypoglycemia).  - The patient  declined referral to a  CDE for individualized DM education.  - I have approached patient with the following individualized plan to manage diabetes and patient agrees.   - Patient  worries a lot about hypoglycemia, she changes around her insulin doses against recommendations.    -  She wants to avoid lowering blood glucose to below 100. -I advised her to resume her Humalog 75/25 40 units with breakfast and 35 units with supper for pre-meal blood glucose readings above 90 mg/dL, associated with monitoring of blood glucose 4 times a day-before meals and at bedtime.    -Patient is encouraged to call clinic for blood glucose levels less than 70 or above 200 mg /dl.  -Her labs show her renal function is improving. She would have benefited from metformin however she reports intolerance to metformin.   - Patient specific target  for A1c; LDL, HDL, Triglycerides, and  Waist Circumference were discussed in detail.  2) BP/HTN: Her blood pressure is controlled to target.  She is advised to continue her current blood pressure medications including controlled.  I advised her to continue current medications including lisinopril 7.5 mg p.o. daily.    3) Lipids/HPL: Recent lipid panel showed controlled LDL at 64.  She is advised to continue atorvastatin 10 mg p.o. nightly.   4)  Weight/Diet: She declined CDE consult . exercise, and carbohydrates information provided.  5) Chronic Care/Health Maintenance:  -Patient is on ACEI/ARB and  Statin medications and encouraged to continue to follow up with Ophthalmology, Podiatrist at least yearly or according to recommendations, and advised to  stay away from smoking. I have recommended yearly flu vaccine and pneumonia vaccination at least every 5 years; moderate intensity exercise for up to 150 minutes weekly; and  sleep for at least 7 hours a day.   - I advised patient to maintain close follow up with Diana Rossetti, Diana Campbell for primary care needs.  - Time spent with the patient: 25 min, of which >50% was spent in reviewing her blood glucose logs , discussing her hypo- and hyper-glycemic episodes, reviewing her current and  previous labs and insulin doses and developing a plan to avoid hypo- and hyper-glycemia. Please refer to Patient Instructions for Blood Glucose Monitoring and Insulin/Medications Dosing Guide"  in media tab for additional information. Kizzie Golda Acre participated in the discussions, expressed understanding, and voiced agreement with the above plans.  All questions were answered to her satisfaction. she is encouraged to contact clinic should she have any questions or concerns prior to her return visit.   Follow up plan: -Return in about 4 months (around 01/22/2018) for Meter, and Logs.  Glade Lloyd, Diana Campbell Phone: [phone]  Fax: [phone]  -  This note was partially dictated with voice recognition software. Similar sounding words can be transcribed inadequately or may not  be corrected upon review.  09/22/2017, 9:07 AM

## 2017-09-22 NOTE — Patient Instructions (Signed)

## 2017-10-01 ENCOUNTER — Telehealth: Payer: Self-pay

## 2017-10-01 NOTE — Telephone Encounter (Signed)
Pt called because her ICD was beeping and it startled her. She just wants to know why it is not beeping today. I stated to the pt that her device has reach eri and she has an appointment with Dr. Royann Shivers on 10/14/2017. I also requested an transmission from her device so the nurse could take a look at it.

## 2017-10-08 LAB — CUP PACEART REMOTE DEVICE CHECK
Brady Statistic AP VP Percent: 0.02 %
Brady Statistic AS VP Percent: 0 %
Brady Statistic AS VS Percent: 11.35 %
Brady Statistic RV Percent Paced: 0.02 %
Date Time Interrogation Session: 20190829101708
HIGH POWER IMPEDANCE MEASURED VALUE: 342 Ohm
HIGH POWER IMPEDANCE MEASURED VALUE: 39 Ohm
HighPow Impedance: 45 Ohm
Implantable Lead Implant Date: 20120702
Implantable Lead Location: 753860
Implantable Lead Model: 5076
Implantable Pulse Generator Implant Date: 20120702
Lead Channel Impedance Value: 418 Ohm
Lead Channel Pacing Threshold Amplitude: 0.75 V
Lead Channel Sensing Intrinsic Amplitude: 2.75 mV
Lead Channel Setting Pacing Pulse Width: 0.4 ms
MDC IDC LEAD IMPLANT DT: 20120702
MDC IDC LEAD LOCATION: 753859
MDC IDC MSMT BATTERY VOLTAGE: 2.61 V
MDC IDC MSMT LEADCHNL RA IMPEDANCE VALUE: 456 Ohm
MDC IDC MSMT LEADCHNL RA PACING THRESHOLD AMPLITUDE: 0.875 V
MDC IDC MSMT LEADCHNL RA PACING THRESHOLD PULSEWIDTH: 0.4 ms
MDC IDC MSMT LEADCHNL RA SENSING INTR AMPL: 2.75 mV
MDC IDC MSMT LEADCHNL RV PACING THRESHOLD PULSEWIDTH: 0.4 ms
MDC IDC MSMT LEADCHNL RV SENSING INTR AMPL: 10.75 mV
MDC IDC MSMT LEADCHNL RV SENSING INTR AMPL: 10.75 mV
MDC IDC SET LEADCHNL RA PACING AMPLITUDE: 2 V
MDC IDC SET LEADCHNL RV PACING AMPLITUDE: 2 V
MDC IDC SET LEADCHNL RV SENSING SENSITIVITY: 0.3 mV
MDC IDC STAT BRADY AP VS PERCENT: 88.64 %
MDC IDC STAT BRADY RA PERCENT PACED: 88.31 %

## 2017-10-14 ENCOUNTER — Ambulatory Visit: Payer: Medicare Other | Admitting: Cardiovascular Disease

## 2017-10-14 ENCOUNTER — Encounter: Payer: Self-pay | Admitting: Cardiovascular Disease

## 2017-10-14 VITALS — BP 114/54 | HR 65 | Ht 67.0 in | Wt 203.0 lb

## 2017-10-14 DIAGNOSIS — E669 Obesity, unspecified: Secondary | ICD-10-CM

## 2017-10-14 DIAGNOSIS — Z4502 Encounter for adjustment and management of automatic implantable cardiac defibrillator: Secondary | ICD-10-CM | POA: Diagnosis not present

## 2017-10-14 DIAGNOSIS — Z8679 Personal history of other diseases of the circulatory system: Secondary | ICD-10-CM | POA: Diagnosis not present

## 2017-10-14 DIAGNOSIS — I10 Essential (primary) hypertension: Secondary | ICD-10-CM | POA: Diagnosis not present

## 2017-10-14 DIAGNOSIS — E1169 Type 2 diabetes mellitus with other specified complication: Secondary | ICD-10-CM

## 2017-10-14 DIAGNOSIS — E7800 Pure hypercholesterolemia, unspecified: Secondary | ICD-10-CM

## 2017-10-14 DIAGNOSIS — I428 Other cardiomyopathies: Secondary | ICD-10-CM

## 2017-10-14 LAB — CBC
HEMOGLOBIN: 11.7 g/dL (ref 11.1–15.9)
Hematocrit: 36.1 % (ref 34.0–46.6)
MCH: 25.2 pg — AB (ref 26.6–33.0)
MCHC: 32.4 g/dL (ref 31.5–35.7)
MCV: 78 fL — ABNORMAL LOW (ref 79–97)
Platelets: 284 10*3/uL (ref 150–450)
RBC: 4.65 x10E6/uL (ref 3.77–5.28)
RDW: 14.2 % (ref 12.3–15.4)
WBC: 11.6 10*3/uL — ABNORMAL HIGH (ref 3.4–10.8)

## 2017-10-14 LAB — BASIC METABOLIC PANEL
BUN/Creatinine Ratio: 16 (ref 12–28)
BUN: 16 mg/dL (ref 8–27)
CO2: 26 mmol/L (ref 20–29)
CREATININE: 1 mg/dL (ref 0.57–1.00)
Calcium: 10 mg/dL (ref 8.7–10.3)
Chloride: 103 mmol/L (ref 96–106)
GFR calc Af Amer: 65 mL/min/{1.73_m2} (ref >59)
GFR, EST NON AFRICAN AMERICAN: 56 mL/min/{1.73_m2} — AB (ref >59)
Glucose: 173 mg/dL — ABNORMAL HIGH (ref 65–99)
Potassium: 4.1 mmol/L (ref 3.5–5.2)
Sodium: 144 mmol/L (ref 134–144)

## 2017-10-14 LAB — PROTIME-INR
INR: 1 (ref 0.8–1.2)
Prothrombin Time: 10.6 s (ref 9.1–12.0)

## 2017-10-14 NOTE — Progress Notes (Signed)
Patient ID: Diana Campbell, female   DOB: Oct 22, 1945, 72 y.o.   MRN: [id number]    Cardiology Office Note    Date:  10/16/2017   [name], Diana Campbell [birth year], [name]  PCP:  Alycia Rossetti, MD  Cardiologist:  Shelva Majestic, M.D. Sanda Klein, MD   Chief Complaint  Patient presents with  . Follow-up    Defibrillator battery depletion    History of Present Illness:  Diana Campbell is a 72 y.o. female a remote history of ventricular fibrillation arrest in June 2005, resuscitated, in the setting of nonischemic cardiomyopathy with left ventricular ejection fraction that has improved from 15% at diagnosis to around 45% by most recent evaluation. She has minor coronary atherosclerosis by previous angiography. She had a  Medtronic defibrillator/Sprint Fidelis lead,subsequently had a complete revision of her system with placement of a new right ventricular lead and dual-chamber generator in July 2012.  She is here because her most recent defibrillator remote download shows that her battery reached elective replacement indicator on August 25.  She does not have any cardiovascular complaints.  She has a variety of musculoskeletal aches and pains.  Additional problems include obstructive sleep apnea, diabetes mellitus on insulin, hypertension and hyperlipidemia.  The patient specifically denies any chest pain at rest exertion, dyspnea at rest or with exertion, orthopnea, paroxysmal nocturnal dyspnea, syncope, palpitations, focal neurological deficits, intermittent claudication, lower extremity edema, unexplained weight gain, cough, hemoptysis or wheezing. She has not required diuretic dose escalation.  She is only taking half of a 5 mg tablet of lisinopril, even though the prescription had been for 1-1/2 tablets.  Event battery depletion, ICD interrogation shows normal function, normal lead parameters, no ventricular tachycardia.  Arial pacing occurs 88% of the time and there is no  ventricular pacing.  Activity is constant at around 2 hours/day.  Favorable heart rate histogram distribution. Her thoracic impedance has been stable.  Past Medical History:  Diagnosis Date  . Arthritis   . Automatic implantable cardioverter-defibrillator in situ 07/2010  . Cardiac abnormality    with defibrilator  . Chronic combined systolic and diastolic CHF (congestive heart failure) ([place]) 09/04/2015  . Diabetes mellitus   . H/O cardiac arrest 2006  . Hypercholesteremia   . Hypertension   . Neuropathy     Past Surgical History:  Procedure Laterality Date  . 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  Marland Kitchen CARDIAC CATHETERIZATION  04/14/2000   Normal coronary arteries.  Marland Kitchen CARDIAC DEFIBRILLATOR PLACEMENT  07/21/2010   Medtronic Protecta XT DR, model#[id number], serial#[id number]  . CARDIOVASCULAR STRESS TEST  04/05/2000   Scintigraphic evidence of mild LV dilatation and global LV dysfunction with an EF 41% with mild inferolateral ischemia.  Marland Kitchen CARPAL TUNNEL RELEASE Right 01/23/2016   Procedure: CARPAL TUNNEL RELEASE;  Surgeon: Carole Civil, MD;  Location: [facility];  Service: Orthopedics;  Laterality: Right;  pt knows to arrive at 6:15  . COLONOSCOPY  03/30/2011   Procedure: COLONOSCOPY;  Surgeon: Jamesetta So, MD;  Location: [facility];  Service: Gastroenterology;  Laterality: N/A;  . DORSAL COMPARTMENT RELEASE Right 08/11/2013   Procedure: RELEASE DORSAL COMPARTMENT (DEQUERVAIN);  Surgeon: Carole Civil, MD;  Location: [facility];  Service: Orthopedics;  Laterality: Right;  . TRANSTHORACIC ECHOCARDIOGRAM  08/03/2011   EF 45-50%, mildly reduced LV systolic function.    Outpatient Medications Prior  to Visit  Medication Sig Dispense Refill  . ACCU-CHEK FASTCLIX LANCETS MISC 1 each by Does not apply route 2 (two) times daily. 200 each 5    . acetaminophen (TYLENOL) 325 MG tablet Take 650 mg by mouth at bedtime as needed (pain).    Marland Kitchen aspirin 325 MG EC tablet Take 81 mg by mouth daily.    Marland Kitchen atorvastatin (LIPITOR) 10 MG tablet Take 10 mg by mouth at bedtime.     . calcium-vitamin D (OSCAL WITH D) 500-200 MG-UNIT per tablet Take 1 tablet by mouth 2 (two) times daily.     . carvedilol (COREG) 12.5 MG tablet TAKE 0.5 TABLETS (6.25 MG TOTAL) BY MOUTH 2 (TWO) TIMES DAILY WITH A MEAL. 30 tablet 9  . Cholecalciferol (VITAMIN D PO) Take 1 tablet by mouth daily.    . hydrochlorothiazide (MICROZIDE) 12.5 MG capsule Take 1 capsule (12.5 mg total) by mouth as needed (ankle swelling). 30 capsule 3  . Insulin Lispro Prot & Lispro (HUMALOG MIX 75/25 KWIKPEN [place]) Inject 35-40 Units into the skin 2 (two) times daily before a meal.    . Lancets Misc. (ACCU-CHEK FASTCLIX LANCET) KIT 1 each by Does not apply route 2 (two) times daily. 1 kit 2  . lisinopril (PRINIVIL,ZESTRIL) 5 MG tablet Take 1.5 tablets (7.5 mg total) by mouth daily. 135 tablet 3  . meloxicam (MOBIC) 15 MG tablet Take 15 mg by mouth daily.  11  . ACCU-CHEK AVIVA PLUS test strip USE TO TEST BLOOD SUGAR 4 TIMES A DAY (DX: E11.65) 150 each 5   No facility-administered medications prior to visit.      Allergies:   Patient has no known allergies.   Social History   Socioeconomic History  . Marital status: Married    Spouse name: Not on file  . Number of children: Not on file  . Years of education: Not on file  . Highest education level: Not on file  Occupational History  . Not on file  Social Needs  . Financial resource strain: Not on file  . Food insecurity:    Worry: Not on file    Inability: Not on file  . Transportation needs:    Medical: Not on file    Non-medical: Not on file  Tobacco Use  . Smoking status: Never Smoker  . Smokeless tobacco: Current User    Types: Snuff  . Tobacco comment: 3 dips per day  Substance and Sexual Activity  . Alcohol use: No  . Drug  use: No  . Sexual activity: Yes    Birth control/protection: None  Lifestyle  . Physical activity:    Days per week: Not on file    Minutes per session: Not on file  . Stress: Not on file  Relationships  . Social connections:    Talks on phone: Not on file    Gets together: Not on file    Attends religious service: Not on file    Active member of club or organization: Not on file    Attends meetings of clubs or organizations: Not on file    Relationship status: Not on file  Other Topics Concern  . Not on file  Social History Narrative  . Not on file     ROS:   Please see the history of present illness.    ROS All other systems are reviewed and are negative  PHYSICAL EXAM:   VS:  BP (!) 114/54   Pulse 65   Ht 5'  7" (1.702 m)   Wt 203 lb (92.1 kg)   BMI 31.79 kg/m      General: Alert, oriented x3, no distress, mildly obese Head: no evidence of trauma, PERRL, EOMI, no exophtalmos or lid lag, no myxedema, no xanthelasma; normal ears, nose and oropharynx Neck: normal jugular venous pulsations and no hepatojugular reflux; brisk carotid pulses without delay and no carotid bruits Chest: clear to auscultation, no signs of consolidation by percussion or palpation, normal fremitus, symmetrical and full respiratory excursions Cardiovascular: normal position and quality of the apical impulse, regular rhythm, normal first and paradoxically split second heart sounds, no murmurs, rubs or gallops.  Healthy appearing subclavian defibrillator site Abdomen: no tenderness or distention, no masses by palpation, no abnormal pulsatility or arterial bruits, normal bowel sounds, no hepatosplenomegaly Extremities: no clubbing, cyanosis or edema; 2+ radial, ulnar and brachial pulses bilaterally; 2+ right femoral, posterior tibial and dorsalis pedis pulses; 2+ left femoral, posterior tibial and dorsalis pedis pulses; no subclavian or femoral bruits Neurological: grossly nonfocal Psych: Normal mood and  affect   Wt Readings from Last 3 Encounters:  10/14/17 203 lb (92.1 kg)  09/22/17 207 lb (93.9 kg)  06/16/17 207 lb (93.9 kg)      Studies/Labs Reviewed:   EKG:  EKG is ordered today. It shows atrial paced, ventricular sensed rhythm, left axis deviation, left bundle branch block (QRS 128 ms axis), QTC 477 ms  Recent Labs: 11/24/2016: TSH 0.80 09/15/2017: ALT 7 10/14/2017: BUN 16; Creatinine, Ser 1.00; Hemoglobin 11.7; Platelets 284; Potassium 4.1; Sodium 144   Lipid Panel    Component Value Date/Time   CHOL 147 11/24/2016 0931   TRIG 75 11/24/2016 0931   HDL 67 11/24/2016 0931   CHOLHDL 2.2 11/24/2016 0931   VLDL 14 09/26/2015 0856   LDLCALC 64 11/24/2016 0931   ASSESSMENT:    1. Cardiomyopathy, nonischemic ([place])   2. Essential hypertension   3. ICD (implantable cardioverter-defibrillator) battery depletion   4. Diabetes mellitus type 2 in obese ([place])   5. History of atrial fibrillation   6. Hypercholesterolemia      PLAN:  In order of problems listed above:  1.CMP/CHF:  Clinically euvolemic, thoracic impedance is at baseline.  NYHA functional class II.  Recent ejection fraction 40% by echo performed in March 2019 2. HTN:  Good control.  On ACE inhibitor and carvedilol.  Will address her ACE inhibitor dose at future appointments. 3. ICD: Needs generator change out.  This procedure has been fully reviewed with the patient and written informed consent has been obtained. Despite the fact that she has a broad left bundle branch block, she has improved left ventricular ejection fraction and good functional status and does not require CRT upgrade 4. DM: recent creatinine in 0.9-1.0 range, has known diabetic nephropathy.  Hemoglobin A1c is above target, at 8.3%.  5. AFib: had one very brief episode in 201 7, no recurrence.  I don't think anticoagulation is indicated 6. HLP: Excellent parameters at last check    Medication Adjustments/Labs and Tests Ordered: Current  medicines are reviewed at length with the patient today.  Concerns regarding medicines are outlined above.  Medication changes, Labs and Tests ordered today are listed in the Patient Instructions below. Patient Instructions   [facility] [place], [place]  [place], [place] [address]  Phone: [phone] Fax: [phone]  You are scheduled for a/an Implantable Cardioverter Defibrillator Generator Change on Friday, October 18th, 2019 with Dr Sallyanne Kuster.  Please arrive at the [facility] on the day of your procedure.   I will call you to let you know what time to arrive to the hospital.  1. You may have a light, early breakfast the morning of your procedure. NOTHING TO EAT AFTER 8:00 AM. 2. Complete labwork TODAY.  3. You may continue your current medications. 4. Plan for an overnight stay. 5. Bring your insurance cards and a list of your current medications. 6. Wash your chest and neck with the surgical scrub provided the evening before and the morning of your procedure. Rinse well. Instructions have been provided.  * Special note:  Every effort is made to have your procedure done on time.  Occasionally there are emergencies that present themselves at the hospital that may cause delays.  Please be patient if a delay does occur.  If you have ANY questions after you get home, please call the office [phone].  Chelley, CMA Dr Sallyanne Kuster    Preparing for Surgery  Before surgery, you can play an important role. Because skin is not sterile, your skin needs to be as free of germs as possible. You can reduce the number of germs on your skin by washing with CHG (chlorhexidine gluconate) Soap before surgery. CHG is an antiseptic cleaner which kills germs and bonds with the skin to continue killing germs even after washing.  Please do not use if you have an allergy to CHG or  antibacterial soaps. If your skin becomes reddened/irritated, STOP using the CHG.  DO NOT SHAVE (including legs and underarms) for at least 48 hours prior to first CHG shower. It is OK to shave your face.  Please follow these instructions carefully: 1. Shower the night before surgery and the morning of surgery with CHG Soap. 2. If you chose to wash your hair, wash your hair first as usual with your normal shampoo/conditioner. 3. After you shampoo/condition, rinse you hair and body thoroughly to remove shampoo/conditioner. 4. Use CHG as you would any other liquid soap. You can apply CHG directly to the skin and wash gently with a loofah or a clean washcloth. 5. Apply the CHG Soap to your body ONLY FROM THE NECK DOWN. Do not use on open wounds or open sores. Avoid contact with your eyes, ears, mouth, and genitals (private parts). Wash genitals (private part) with your normal soap. 6. Wash thoroughly, paying special attention to the area where your surgery will be performed. 7. Thoroughly rinse your body with warm water from the neck down. 8. DO NOT shower/wash with your normal soap after using and rinsing off the CHG Soap. 9. Pat yourself dry with a clean towel. 10. Wear clean pajamas to bed. 11. Place clean sheets on your bed the night of your first shower and do not sleep with pets..  Day of Surgery: Shower with the CHG Soap following the instructions listed above. DO NOT apply deodorants or lotions. Please wear clean clothes to the hospital/surgery center.      Signed, Sanda Klein, MD  10/16/2017 4:53 PM    [facility] [place], [place], [place]  [address] Phone: [phone]; Fax: [phone]

## 2017-10-14 NOTE — H&P (View-Only) (Signed)
Patient ID: Diana Campbell, female   DOB: Oct 22, 1945, 72 y.o.   MRN: [id number]    Cardiology Office Note    Date:  10/16/2017   [name], Diana Campbell [birth year], [name]  PCP:  Alycia Rossetti, MD  Cardiologist:  Shelva Majestic, M.D. Sanda Klein, MD   Chief Complaint  Patient presents with  . Follow-up    Defibrillator battery depletion    History of Present Illness:  Diana Campbell is a 72 y.o. female a remote history of ventricular fibrillation arrest in June 2005, resuscitated, in the setting of nonischemic cardiomyopathy with left ventricular ejection fraction that has improved from 15% at diagnosis to around 45% by most recent evaluation. She has minor coronary atherosclerosis by previous angiography. She had a  Medtronic defibrillator/Sprint Fidelis lead,subsequently had a complete revision of her system with placement of a new right ventricular lead and dual-chamber generator in July 2012.  She is here because her most recent defibrillator remote download shows that her battery reached elective replacement indicator on August 25.  She does not have any cardiovascular complaints.  She has a variety of musculoskeletal aches and pains.  Additional problems include obstructive sleep apnea, diabetes mellitus on insulin, hypertension and hyperlipidemia.  The patient specifically denies any chest pain at rest exertion, dyspnea at rest or with exertion, orthopnea, paroxysmal nocturnal dyspnea, syncope, palpitations, focal neurological deficits, intermittent claudication, lower extremity edema, unexplained weight gain, cough, hemoptysis or wheezing. She has not required diuretic dose escalation.  She is only taking half of a 5 mg tablet of lisinopril, even though the prescription had been for 1-1/2 tablets.  Event battery depletion, ICD interrogation shows normal function, normal lead parameters, no ventricular tachycardia.  Arial pacing occurs 88% of the time and there is no  ventricular pacing.  Activity is constant at around 2 hours/day.  Favorable heart rate histogram distribution. Her thoracic impedance has been stable.  Past Medical History:  Diagnosis Date  . Arthritis   . Automatic implantable cardioverter-defibrillator in situ 07/2010  . Cardiac abnormality    with defibrilator  . Chronic combined systolic and diastolic CHF (congestive heart failure) ([place]) 09/04/2015  . Diabetes mellitus   . H/O cardiac arrest 2006  . Hypercholesteremia   . Hypertension   . Neuropathy     Past Surgical History:  Procedure Laterality Date  . 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  Marland Kitchen CARDIAC CATHETERIZATION  04/14/2000   Normal coronary arteries.  Marland Kitchen CARDIAC DEFIBRILLATOR PLACEMENT  07/21/2010   Medtronic Protecta XT DR, model#[id number], serial#[id number]  . CARDIOVASCULAR STRESS TEST  04/05/2000   Scintigraphic evidence of mild LV dilatation and global LV dysfunction with an EF 41% with mild inferolateral ischemia.  Marland Kitchen CARPAL TUNNEL RELEASE Right 01/23/2016   Procedure: CARPAL TUNNEL RELEASE;  Surgeon: Carole Civil, MD;  Location: [facility];  Service: Orthopedics;  Laterality: Right;  pt knows to arrive at 6:15  . COLONOSCOPY  03/30/2011   Procedure: COLONOSCOPY;  Surgeon: Jamesetta So, MD;  Location: [facility];  Service: Gastroenterology;  Laterality: N/A;  . DORSAL COMPARTMENT RELEASE Right 08/11/2013   Procedure: RELEASE DORSAL COMPARTMENT (DEQUERVAIN);  Surgeon: Carole Civil, MD;  Location: [facility];  Service: Orthopedics;  Laterality: Right;  . TRANSTHORACIC ECHOCARDIOGRAM  08/03/2011   EF 45-50%, mildly reduced LV systolic function.    Outpatient Medications Prior  to Visit  Medication Sig Dispense Refill  . ACCU-CHEK FASTCLIX LANCETS MISC 1 each by Does not apply route 2 (two) times daily. 200 each 5    . acetaminophen (TYLENOL) 325 MG tablet Take 650 mg by mouth at bedtime as needed (pain).    Marland Kitchen aspirin 325 MG EC tablet Take 81 mg by mouth daily.    Marland Kitchen atorvastatin (LIPITOR) 10 MG tablet Take 10 mg by mouth at bedtime.     . calcium-vitamin D (OSCAL WITH D) 500-200 MG-UNIT per tablet Take 1 tablet by mouth 2 (two) times daily.     . carvedilol (COREG) 12.5 MG tablet TAKE 0.5 TABLETS (6.25 MG TOTAL) BY MOUTH 2 (TWO) TIMES DAILY WITH A MEAL. 30 tablet 9  . Cholecalciferol (VITAMIN D PO) Take 1 tablet by mouth daily.    . hydrochlorothiazide (MICROZIDE) 12.5 MG capsule Take 1 capsule (12.5 mg total) by mouth as needed (ankle swelling). 30 capsule 3  . Insulin Lispro Prot & Lispro (HUMALOG MIX 75/25 KWIKPEN [place]) Inject 35-40 Units into the skin 2 (two) times daily before a meal.    . Lancets Misc. (ACCU-CHEK FASTCLIX LANCET) KIT 1 each by Does not apply route 2 (two) times daily. 1 kit 2  . lisinopril (PRINIVIL,ZESTRIL) 5 MG tablet Take 1.5 tablets (7.5 mg total) by mouth daily. 135 tablet 3  . meloxicam (MOBIC) 15 MG tablet Take 15 mg by mouth daily.  11  . ACCU-CHEK AVIVA PLUS test strip USE TO TEST BLOOD SUGAR 4 TIMES A DAY (DX: E11.65) 150 each 5   No facility-administered medications prior to visit.      Allergies:   Patient has no known allergies.   Social History   Socioeconomic History  . Marital status: Married    Spouse name: Not on file  . Number of children: Not on file  . Years of education: Not on file  . Highest education level: Not on file  Occupational History  . Not on file  Social Needs  . Financial resource strain: Not on file  . Food insecurity:    Worry: Not on file    Inability: Not on file  . Transportation needs:    Medical: Not on file    Non-medical: Not on file  Tobacco Use  . Smoking status: Never Smoker  . Smokeless tobacco: Current User    Types: Snuff  . Tobacco comment: 3 dips per day  Substance and Sexual Activity  . Alcohol use: No  . Drug  use: No  . Sexual activity: Yes    Birth control/protection: None  Lifestyle  . Physical activity:    Days per week: Not on file    Minutes per session: Not on file  . Stress: Not on file  Relationships  . Social connections:    Talks on phone: Not on file    Gets together: Not on file    Attends religious service: Not on file    Active member of club or organization: Not on file    Attends meetings of clubs or organizations: Not on file    Relationship status: Not on file  Other Topics Concern  . Not on file  Social History Narrative  . Not on file     ROS:   Please see the history of present illness.    ROS All other systems are reviewed and are negative  PHYSICAL EXAM:   VS:  BP (!) 114/54   Pulse 65   Ht 5'  7" (1.702 m)   Wt 203 lb (92.1 kg)   BMI 31.79 kg/m      General: Alert, oriented x3, no distress, mildly obese Head: no evidence of trauma, PERRL, EOMI, no exophtalmos or lid lag, no myxedema, no xanthelasma; normal ears, nose and oropharynx Neck: normal jugular venous pulsations and no hepatojugular reflux; brisk carotid pulses without delay and no carotid bruits Chest: clear to auscultation, no signs of consolidation by percussion or palpation, normal fremitus, symmetrical and full respiratory excursions Cardiovascular: normal position and quality of the apical impulse, regular rhythm, normal first and paradoxically split second heart sounds, no murmurs, rubs or gallops.  Healthy appearing subclavian defibrillator site Abdomen: no tenderness or distention, no masses by palpation, no abnormal pulsatility or arterial bruits, normal bowel sounds, no hepatosplenomegaly Extremities: no clubbing, cyanosis or edema; 2+ radial, ulnar and brachial pulses bilaterally; 2+ right femoral, posterior tibial and dorsalis pedis pulses; 2+ left femoral, posterior tibial and dorsalis pedis pulses; no subclavian or femoral bruits Neurological: grossly nonfocal Psych: Normal mood and  affect   Wt Readings from Last 3 Encounters:  10/14/17 203 lb (92.1 kg)  09/22/17 207 lb (93.9 kg)  06/16/17 207 lb (93.9 kg)      Studies/Labs Reviewed:   EKG:  EKG is ordered today. It shows atrial paced, ventricular sensed rhythm, left axis deviation, left bundle branch block (QRS 128 ms axis), QTC 477 ms  Recent Labs: 11/24/2016: TSH 0.80 09/15/2017: ALT 7 10/14/2017: BUN 16; Creatinine, Ser 1.00; Hemoglobin 11.7; Platelets 284; Potassium 4.1; Sodium 144   Lipid Panel    Component Value Date/Time   CHOL 147 11/24/2016 0931   TRIG 75 11/24/2016 0931   HDL 67 11/24/2016 0931   CHOLHDL 2.2 11/24/2016 0931   VLDL 14 09/26/2015 0856   LDLCALC 64 11/24/2016 0931   ASSESSMENT:    1. Cardiomyopathy, nonischemic ([place])   2. Essential hypertension   3. ICD (implantable cardioverter-defibrillator) battery depletion   4. Diabetes mellitus type 2 in obese ([place])   5. History of atrial fibrillation   6. Hypercholesterolemia      PLAN:  In order of problems listed above:  1.CMP/CHF:  Clinically euvolemic, thoracic impedance is at baseline.  NYHA functional class II.  Recent ejection fraction 40% by echo performed in March 2019 2. HTN:  Good control.  On ACE inhibitor and carvedilol.  Will address her ACE inhibitor dose at future appointments. 3. ICD: Needs generator change out.  This procedure has been fully reviewed with the patient and written informed consent has been obtained. Despite the fact that she has a broad left bundle branch block, she has improved left ventricular ejection fraction and good functional status and does not require CRT upgrade 4. DM: recent creatinine in 0.9-1.0 range, has known diabetic nephropathy.  Hemoglobin A1c is above target, at 8.3%.  5. AFib: had one very brief episode in 201 7, no recurrence.  I don't think anticoagulation is indicated 6. HLP: Excellent parameters at last check    Medication Adjustments/Labs and Tests Ordered: Current  medicines are reviewed at length with the patient today.  Concerns regarding medicines are outlined above.  Medication changes, Labs and Tests ordered today are listed in the Patient Instructions below. Patient Instructions   [facility], [place]  [place], [place] [address]  Phone: [phone] Fax: [phone]  You are scheduled for a/an Implantable Cardioverter Defibrillator Generator Change on Friday, October 18th, 2019 with Dr Sallyanne Kuster.  Please arrive at the [facility] on the day of your procedure.   I will call you to let you know what time to arrive to the hospital.  1. You may have a light, early breakfast the morning of your procedure. NOTHING TO EAT AFTER 8:00 AM. 2. Complete labwork TODAY.  3. You may continue your current medications. 4. Plan for an overnight stay. 5. Bring your insurance cards and a list of your current medications. 6. Wash your chest and neck with the surgical scrub provided the evening before and the morning of your procedure. Rinse well. Instructions have been provided.  * Special note:  Every effort is made to have your procedure done on time.  Occasionally there are emergencies that present themselves at the hospital that may cause delays.  Please be patient if a delay does occur.  If you have ANY questions after you get home, please call the office [phone].  Chelley, CMA Dr Sallyanne Kuster    Preparing for Surgery  Before surgery, you can play an important role. Because skin is not sterile, your skin needs to be as free of germs as possible. You can reduce the number of germs on your skin by washing with CHG (chlorhexidine gluconate) Soap before surgery. CHG is an antiseptic cleaner which kills germs and bonds with the skin to continue killing germs even after washing.  Please do not use if you have an allergy to CHG or  antibacterial soaps. If your skin becomes reddened/irritated, STOP using the CHG.  DO NOT SHAVE (including legs and underarms) for at least 48 hours prior to first CHG shower. It is OK to shave your face.  Please follow these instructions carefully: 1. Shower the night before surgery and the morning of surgery with CHG Soap. 2. If you chose to wash your hair, wash your hair first as usual with your normal shampoo/conditioner. 3. After you shampoo/condition, rinse you hair and body thoroughly to remove shampoo/conditioner. 4. Use CHG as you would any other liquid soap. You can apply CHG directly to the skin and wash gently with a loofah or a clean washcloth. 5. Apply the CHG Soap to your body ONLY FROM THE NECK DOWN. Do not use on open wounds or open sores. Avoid contact with your eyes, ears, mouth, and genitals (private parts). Wash genitals (private part) with your normal soap. 6. Wash thoroughly, paying special attention to the area where your surgery will be performed. 7. Thoroughly rinse your body with warm water from the neck down. 8. DO NOT shower/wash with your normal soap after using and rinsing off the CHG Soap. 9. Pat yourself dry with a clean towel. 10. Wear clean pajamas to bed. 11. Place clean sheets on your bed the night of your first shower and do not sleep with pets..  Day of Surgery: Shower with the CHG Soap following the instructions listed above. DO NOT apply deodorants or lotions. Please wear clean clothes to the hospital/surgery center.      Signed, Sanda Klein, MD  10/16/2017 4:53 PM    [facility] [place], [place], [place]  [address] Phone: [phone]; Fax: [phone]

## 2017-10-14 NOTE — Patient Instructions (Addendum)
  [facility]  [address]  [place], [place] [address]  Phone: [phone] Fax: [phone]  You are scheduled for a/an Implantable Cardioverter Defibrillator Generator Change on Friday, October 18th, 2019 with Dr Royann Shivers.  Please arrive at the [facility] ([address] [place]) on the day of your procedure.   I will call you to let you know what time to arrive to the hospital.  1. You may have a light, early breakfast the morning of your procedure. NOTHING TO EAT AFTER 8:00 AM. 2. Complete labwork TODAY.  3. You may continue your current medications. 4. Plan for an overnight stay. 5. Bring your insurance cards and a list of your current medications. 6. Wash your chest and neck with the surgical scrub provided the evening before and the morning of your procedure. Rinse well. Instructions have been provided.  * Special note:  Every effort is made to have your procedure done on time.  Occasionally there are emergencies that present themselves at the hospital that may cause delays.  Please be patient if a delay does occur.  If you have ANY questions after you get home, please call the office [phone].  Chelley, CMA Dr Royann Shivers    Preparing for Surgery  Before surgery, you can play an important role. Because skin is not sterile, your skin needs to be as free of germs as possible. You can reduce the number of germs on your skin by washing with CHG (chlorhexidine gluconate) Soap before surgery. CHG is an antiseptic cleaner which kills germs and bonds with the skin to continue killing germs even after washing.  Please do not use if you have an allergy to CHG or antibacterial soaps. If your skin becomes reddened/irritated, STOP using the CHG.  DO NOT SHAVE (including legs and underarms) for at least 48 hours prior to first CHG shower. It is OK to shave your face.  Please follow these  instructions carefully: 1. Shower the night before surgery and the morning of surgery with CHG Soap. 2. If you chose to wash your hair, wash your hair first as usual with your normal shampoo/conditioner. 3. After you shampoo/condition, rinse you hair and body thoroughly to remove shampoo/conditioner. 4. Use CHG as you would any other liquid soap. You can apply CHG directly to the skin and wash gently with a loofah or a clean washcloth. 5. Apply the CHG Soap to your body ONLY FROM THE NECK DOWN. Do not use on open wounds or open sores. Avoid contact with your eyes, ears, mouth, and genitals (private parts). Wash genitals (private part) with your normal soap. 6. Wash thoroughly, paying special attention to the area where your surgery will be performed. 7. Thoroughly rinse your body with warm water from the neck down. 8. DO NOT shower/wash with your normal soap after using and rinsing off the CHG Soap. 9. Pat yourself dry with a clean towel. 10. Wear clean pajamas to bed. 11. Place clean sheets on your bed the night of your first shower and do not sleep with pets..  Day of Surgery: Shower with the CHG Soap following the instructions listed above. DO NOT apply deodorants or lotions. Please wear clean clothes to the hospital/surgery center.

## 2017-10-16 ENCOUNTER — Encounter: Payer: Self-pay | Admitting: Cardiovascular Disease

## 2017-10-17 ENCOUNTER — Other Ambulatory Visit: Payer: Self-pay | Admitting: Cardiovascular Disease

## 2017-10-19 ENCOUNTER — Other Ambulatory Visit: Payer: Self-pay | Admitting: Cardiovascular Disease

## 2017-10-19 NOTE — Addendum Note (Signed)
Addended by: Barrie Dunker on: 10/19/2017 04:10 PM   Modules accepted: Orders

## 2017-10-22 ENCOUNTER — Other Ambulatory Visit: Payer: Self-pay | Admitting: Cardiovascular Disease

## 2017-10-22 DIAGNOSIS — Z4502 Encounter for adjustment and management of automatic implantable cardiac defibrillator: Secondary | ICD-10-CM

## 2017-10-29 ENCOUNTER — Encounter (HOSPITAL_COMMUNITY)
Admission: RE | Disposition: A | Discharge status: Home / Self Care | Payer: Self-pay | Source: Ambulatory Visit | Attending: Cardiovascular Disease

## 2017-10-29 ENCOUNTER — Ambulatory Visit (HOSPITAL_COMMUNITY)
Admission: RE | Admit: 2017-10-29 | Discharge: 2017-10-29 | Disposition: A | Discharge status: Home / Self Care | Payer: Medicare Other | Source: Ambulatory Visit | Attending: Cardiovascular Disease | Admitting: Cardiovascular Disease

## 2017-10-29 ENCOUNTER — Encounter (HOSPITAL_COMMUNITY): Payer: Self-pay | Admitting: Cardiovascular Disease

## 2017-10-29 ENCOUNTER — Other Ambulatory Visit: Payer: Self-pay

## 2017-10-29 DIAGNOSIS — Z794 Long term (current) use of insulin: Secondary | ICD-10-CM | POA: Diagnosis not present

## 2017-10-29 DIAGNOSIS — M1990 Unspecified osteoarthritis, unspecified site: Secondary | ICD-10-CM | POA: Insufficient documentation

## 2017-10-29 DIAGNOSIS — Z7982 Long term (current) use of aspirin: Secondary | ICD-10-CM | POA: Insufficient documentation

## 2017-10-29 DIAGNOSIS — E1121 Type 2 diabetes mellitus with diabetic nephropathy: Secondary | ICD-10-CM | POA: Diagnosis not present

## 2017-10-29 DIAGNOSIS — I447 Left bundle-branch block, unspecified: Secondary | ICD-10-CM | POA: Diagnosis not present

## 2017-10-29 DIAGNOSIS — Z95810 Presence of automatic (implantable) cardiac defibrillator: Secondary | ICD-10-CM

## 2017-10-29 DIAGNOSIS — I5042 Chronic combined systolic (congestive) and diastolic (congestive) heart failure: Secondary | ICD-10-CM | POA: Insufficient documentation

## 2017-10-29 DIAGNOSIS — E7800 Pure hypercholesterolemia, unspecified: Secondary | ICD-10-CM | POA: Insufficient documentation

## 2017-10-29 DIAGNOSIS — Z8674 Personal history of sudden cardiac arrest: Secondary | ICD-10-CM | POA: Insufficient documentation

## 2017-10-29 DIAGNOSIS — Z79899 Other long term (current) drug therapy: Secondary | ICD-10-CM | POA: Insufficient documentation

## 2017-10-29 DIAGNOSIS — Z6831 Body mass index (BMI) 31.0-31.9, adult: Secondary | ICD-10-CM | POA: Insufficient documentation

## 2017-10-29 DIAGNOSIS — Z8679 Personal history of other diseases of the circulatory system: Secondary | ICD-10-CM | POA: Diagnosis not present

## 2017-10-29 DIAGNOSIS — E1140 Type 2 diabetes mellitus with diabetic neuropathy, unspecified: Secondary | ICD-10-CM | POA: Insufficient documentation

## 2017-10-29 DIAGNOSIS — Z9889 Other specified postprocedural states: Secondary | ICD-10-CM | POA: Insufficient documentation

## 2017-10-29 DIAGNOSIS — Z006 Encounter for examination for normal comparison and control in clinical research program: Secondary | ICD-10-CM | POA: Insufficient documentation

## 2017-10-29 DIAGNOSIS — R001 Bradycardia, unspecified: Secondary | ICD-10-CM | POA: Insufficient documentation

## 2017-10-29 DIAGNOSIS — G4733 Obstructive sleep apnea (adult) (pediatric): Secondary | ICD-10-CM | POA: Diagnosis not present

## 2017-10-29 DIAGNOSIS — Z4502 Encounter for adjustment and management of automatic implantable cardiac defibrillator: Secondary | ICD-10-CM | POA: Diagnosis not present

## 2017-10-29 DIAGNOSIS — E669 Obesity, unspecified: Secondary | ICD-10-CM | POA: Insufficient documentation

## 2017-10-29 DIAGNOSIS — I110 Hypertensive heart disease with heart failure: Secondary | ICD-10-CM | POA: Insufficient documentation

## 2017-10-29 HISTORY — PX: ICD GENERATOR CHANGEOUT: EP1231

## 2017-10-29 LAB — GLUCOSE, CAPILLARY: GLUCOSE-CAPILLARY: 128 mg/dL — AB (ref 70–99)

## 2017-10-29 LAB — SURGICAL PCR SCREEN
MRSA, PCR: NEGATIVE
Staphylococcus aureus: NEGATIVE

## 2017-10-29 SURGERY — ICD GENERATOR CHANGEOUT

## 2017-10-29 MED ORDER — LIDOCAINE HCL (PF) 1 % IJ SOLN
INTRAMUSCULAR | Status: AC
  Filled 2017-10-29: qty 60

## 2017-10-29 MED ORDER — SODIUM CHLORIDE 0.9 % IV SOLN
80.0000 mg | INTRAVENOUS | Status: AC
  Administered 2017-10-29: 80 mg

## 2017-10-29 MED ORDER — CEFAZOLIN SODIUM-DEXTROSE 2-4 GM/100ML-% IV SOLN
INTRAVENOUS | Status: AC
  Filled 2017-10-29: qty 100

## 2017-10-29 MED ORDER — CEFAZOLIN SODIUM-DEXTROSE 2-4 GM/100ML-% IV SOLN
2.0000 g | INTRAVENOUS | Status: AC
  Administered 2017-10-29: 2 g via INTRAVENOUS

## 2017-10-29 MED ORDER — MUPIROCIN 2 % EX OINT
TOPICAL_OINTMENT | CUTANEOUS | Status: AC
  Administered 2017-10-29: 12:00:00
  Filled 2017-10-29: qty 22

## 2017-10-29 MED ORDER — LIDOCAINE HCL (PF) 1 % IJ SOLN
INTRAMUSCULAR | Status: DC | PRN
  Administered 2017-10-29: 45 mL

## 2017-10-29 MED ORDER — CHLORHEXIDINE GLUCONATE 4 % EX LIQD
60.0000 mL | Freq: Once | CUTANEOUS | Status: DC
  Filled 2017-10-29: qty 60

## 2017-10-29 MED ORDER — SODIUM CHLORIDE 0.9 % IV SOLN
INTRAVENOUS | Status: AC
  Filled 2017-10-29: qty 2

## 2017-10-29 MED ORDER — ACETAMINOPHEN 325 MG PO TABS
325.0000 mg | ORAL_TABLET | ORAL | Status: DC | PRN
  Filled 2017-10-29: qty 2

## 2017-10-29 MED ORDER — SODIUM CHLORIDE 0.9 % IV SOLN
INTRAVENOUS | Status: DC
  Administered 2017-10-29: 12:00:00 via INTRAVENOUS

## 2017-10-29 MED ORDER — ONDANSETRON HCL 4 MG/2ML IJ SOLN: 4.0000 mg | Freq: Four times a day (QID) | INTRAMUSCULAR | Status: DC | PRN

## 2017-10-29 SURGICAL SUPPLY — 5 items
CABLE SURGICAL S-101-97-12 (CABLE) ×3 IMPLANT
ICD EVERA XT MRI DF1  DDMB1D1 (ICD Generator) ×2 IMPLANT
ICD EVERA XT MRI DF1 DDMB1D1 (ICD Generator) ×1 IMPLANT
PAD PRO RADIOLUCENT 2001M-C (PAD) ×3 IMPLANT
TRAY PACEMAKER INSERTION (PACKS) ×3 IMPLANT

## 2017-10-29 NOTE — Interval H&P Note (Signed)
History and Physical Interval Note:  10/29/2017 2:16 PM  Diana Campbell  has presented today for surgery, with the diagnosis of eri  The various methods of treatment have been discussed with the patient and family. After consideration of risks, benefits and other options for treatment, the patient has consented to  Procedure(s): ICD GENERATOR CHANGEOUT (N/A) as a surgical intervention .  The patient's history has been reviewed, patient examined, no change in status, stable for surgery.  I have reviewed the patient's chart and labs.  Questions were answered to the patient's satisfaction.     Aveena Bari

## 2017-10-29 NOTE — Discharge Instructions (Signed)
Supplemental Discharge Instructions for  °Pacemaker/Defibrillator Patients ° °Activity °No restrictions. DO wear your seatbelt, even if it crosses over the pacemaker site. ° °WOUND CARE °- Keep the wound area clean and dry.  Remove the dressing the day after you return home (usually 48 hours after the procedure). °- DO NOT SUBMERGE UNDER WATER UNTIL FULLY HEALED (no tub baths, hot tubs, swimming pools, etc.).  °- You  may shower or take a sponge bath after the dressing is removed. DO NOT SOAK the area and do not allow the shower to directly spray on the site. °- If you have staples, these will be removed in the office in 7-14 days. °- If you have tape/steri-strips on your wound, these will fall off; do not pull them off prematurely.   °- No bandage is needed on the site.  DO  NOT apply any creams, oils, or ointments to the wound area. °- If you notice any drainage or discharge from the wound, any swelling, excessive redness or bruising at the site, or if you develop a fever > 101? F after you are discharged home, call the office at once. ° °Special Instructions °- You are still able to use cellular telephones.  Avoid carrying your cellular phone near your device. °- When traveling through airports, show security personnel your identification card to avoid being screened in the metal detectors.  °- Avoid arc welding equipment, MRI testing (magnetic resonance imaging), TENS units (transcutaneous nerve stimulators).  Call the office for questions about other devices. °- Avoid electrical appliances that are in poor condition or are not properly grounded. °- Microwave ovens are safe to be near or to operate. ° °Additional information for defibrillator patients should your device go off: °- If your device goes off ONCE and you feel fine afterward, notify the clinic at [phone]. °- If your device goes off ONCE and you do not feel well afterward, call 911. °- If your device goes off TWICE or more in one day, call  911. ° °DO NOT DRIVE YOURSELF OR A FAMILY MEMBER °WITH A DEFIBRILLATOR TO THE HOSPITAL--CALL 911. ° ° °

## 2017-10-29 NOTE — Op Note (Signed)
Procedure report  Procedure performed:  1. Dual chamber ICD generator changeout   Reason for procedure:  1. Device generator at elective replacement interval  2. History of VF arrest 3. Sinus bradycardia, symptomatic Procedure performed by:  Thurmon Fair, MD  Complications:  None  Estimated blood loss:  <5 mL  Medications administered during procedure:  Ancef 2 g intravenously, lidocaine 1% 30 mL locally  Device details:   Administrator, sports MRI DR XT model number [id number], serial number [id number] Right atrial lead (chronic) Medtronic, model number [id number], serial [id number] (implanted 07/21/2010) Right ventricular lead (chronic)  Medtronic , model number [id number], serial number [id number] (implanted 07/21/2010)  Explanted generator Medtronic Protecta XT DR,  model number [id number], serial number  [id number] (implanted 07/21/2010)  Procedure details:  After the risks and benefits of the procedure were discussed the patient provided informed consent. She was brought to the cardiac catheter lab in the fasting state. The patient was prepped and draped in usual sterile fashion. Local anesthesia with 1% lidocaine was administered to to the left infraclavicular area. A 5-6cm horizontal incision was made parallel with and 2-3 cm caudal to the left clavicle, in the area of an old scar.  Using minimal electrocautery and mostly sharp and blunt dissection the prepectoral pocket was opened carefully to avoid injury to the loops of chronic leads. Extensive dissection was not necessary. The device was explanted. The pocket was carefully inspected for hemostasis and flushed with copious amounts of antibiotic solution.  The leads were disconnected from the old generator and testing of the lead parameters later showed excellent values. The new generator was connected to the chronic leads, with appropriate pacing noted.   The entire system was then carefully inserted in the pocket with  care been taking that the leads and device assumed a comfortable position without pressure on the incision. Great care was taken that the leads be located deep to the generator. The pocket was then closed in layers using 2 layers of 2-0 Vicryl and cutaneous staples after which a sterile dressing was applied.   At the end of the procedure the following lead parameters were encountered:   Right atrial lead sensed P waves 3.3 mV, impedance 437 ohms, threshold 0.75 at 0.4 ms pulse width.  Right ventricular lead sensed R waves  12.2 mV, impedance 494 ohms, threshold 0.75 at 0.4 ms pulse width. High voltage proximal 54 ohm, distal 45 ohm.  Thurmon Fair, MD, [facility] [phone] office [phone] pager

## 2017-10-29 NOTE — Progress Notes (Signed)
ICD Criteria  Current LVEF:40%. Within 12 months prior to implant: Yes   Heart failure history: Yes, Class II  Cardiomyopathy history: Yes, Non-Ischemic Cardiomyopathy.  Atrial Fibrillation/Atrial Flutter: Yes, Paroxysmal.  Ventricular tachycardia history: Yes, Hemodynamic instability present. VT Type: Sustained Ventricular Tachycardia - Polymorphic.  Cardiac arrest history: Yes, Ventricular Fibrillation.  History of syndromes with risk of sudden death: No.  Previous ICD: Yes, Reason for ICD:  Secondary prevention.  Current ICD indication: Secondary  PPM indication: Yes. Pacing type: Atrial. Greater than 40% RV pacing requirement anticipated. Indication: Sick Sinus Syndrome  Class I or II Bradycardia indication present: Yes  Beta Blocker therapy for 3 or more months: Yes, prescribed.   Ace Inhibitor/ARB therapy for 3 or more months: Yes, prescribed.    I have seen Diana Campbell is a 72 y.o. femalepre-procedural and has been referred for consideration of ICD changeout for secondary prevention of sudden death.  The patient's chart has been reviewed and they meet criteria for ICD implant/generator change for ERI.  I have had a thorough discussion with the patient reviewing options.  The patient and their family (if available) have had opportunities to ask questions and have them answered. The patient and I have decided together through the [facility] Share Decision Support Tool to implant ICD at this time.  Risks, benefits, alternatives to ICD implantation were discussed in detail with the patient today. The patient  understands that the risks include but are not limited to bleeding, infection, MI, stroke, death, inappropriate shocks and wishes to proceed.

## 2017-11-03 ENCOUNTER — Telehealth: Payer: Self-pay | Admitting: Cardiovascular Disease

## 2017-11-03 NOTE — Telephone Encounter (Signed)
Spoke with pt informed her that we would take the bandage off at her apt on 11/08/2017. Pt voiced understanding.

## 2017-11-03 NOTE — Telephone Encounter (Signed)
New message:       1. Has your device fired? No  2. Is you device beeping? No  3. Are you experiencing draining or swelling at device site? No  4. Are you calling to see if we received your device transmission? No  5. Have you passed out? No   Pt states she just had her battery replaced and needs to know if she can take off the bandages.    Please route to Device Clinic Pool

## 2017-11-08 ENCOUNTER — Ambulatory Visit (INDEPENDENT_AMBULATORY_CARE_PROVIDER_SITE_OTHER): Payer: Medicare Other | Admitting: *Deleted

## 2017-11-08 ENCOUNTER — Encounter (INDEPENDENT_AMBULATORY_CARE_PROVIDER_SITE_OTHER): Payer: Self-pay

## 2017-11-08 DIAGNOSIS — I5042 Chronic combined systolic (congestive) and diastolic (congestive) heart failure: Secondary | ICD-10-CM

## 2017-11-08 DIAGNOSIS — I428 Other cardiomyopathies: Secondary | ICD-10-CM | POA: Diagnosis not present

## 2017-11-08 DIAGNOSIS — Z95810 Presence of automatic (implantable) cardiac defibrillator: Secondary | ICD-10-CM

## 2017-11-08 LAB — CUP PACEART INCLINIC DEVICE CHECK
Battery Remaining Longevity: 124 mo
Brady Statistic AP VS Percent: 54.38 %
Brady Statistic AS VP Percent: 0.01 %
Brady Statistic AS VS Percent: 45.58 %
HIGH POWER IMPEDANCE MEASURED VALUE: 51 Ohm
HighPow Impedance: 39 Ohm
Implantable Lead Location: 753859
Implantable Lead Model: 6947
Lead Channel Impedance Value: 456 Ohm
Lead Channel Pacing Threshold Amplitude: 0.75 V
Lead Channel Pacing Threshold Pulse Width: 0.4 ms
Lead Channel Pacing Threshold Pulse Width: 0.4 ms
Lead Channel Sensing Intrinsic Amplitude: 4.375 mV
Lead Channel Setting Pacing Amplitude: 2 V
Lead Channel Setting Pacing Pulse Width: 0.4 ms
MDC IDC LEAD IMPLANT DT: 20120702
MDC IDC LEAD IMPLANT DT: 20120702
MDC IDC LEAD LOCATION: 753860
MDC IDC MSMT BATTERY VOLTAGE: 3.14 V
MDC IDC MSMT LEADCHNL RV IMPEDANCE VALUE: 380 Ohm
MDC IDC MSMT LEADCHNL RV IMPEDANCE VALUE: 437 Ohm
MDC IDC MSMT LEADCHNL RV PACING THRESHOLD AMPLITUDE: 0.75 V
MDC IDC MSMT LEADCHNL RV SENSING INTR AMPL: 15 mV
MDC IDC PG IMPLANT DT: 20191011
MDC IDC SESS DTM: 20191021145122
MDC IDC SET LEADCHNL RA PACING AMPLITUDE: 2 V
MDC IDC SET LEADCHNL RV SENSING SENSITIVITY: 0.3 mV
MDC IDC STAT BRADY AP VP PERCENT: 0.02 %
MDC IDC STAT BRADY RA PERCENT PACED: 54.05 %
MDC IDC STAT BRADY RV PERCENT PACED: 0.03 %

## 2017-11-08 NOTE — Progress Notes (Signed)
Wound check appointment. Staples removed. Wound without redness or edema. Incision edges approximated, wound well healed. Normal device function. Thresholds, sensing, and impedances consistent with implant measurements. Device programmed at chronic outputs s/p generator replacement. Histogram distribution appropriate for patient and level of activity. No mode switches or ventricular arrhythmias noted. Patient educated about wound care, arm mobility, shock plan, and Carelink monitor. ROV with [facility] on 02/10/18.

## 2017-11-10 ENCOUNTER — Ambulatory Visit: Payer: Medicare Other

## 2017-11-18 ENCOUNTER — Other Ambulatory Visit: Payer: Self-pay | Admitting: "Endocrinology

## 2017-12-19 ENCOUNTER — Other Ambulatory Visit: Payer: Self-pay | Admitting: "Endocrinology

## 2017-12-27 ENCOUNTER — Other Ambulatory Visit: Payer: Self-pay

## 2017-12-27 NOTE — Patient Outreach (Signed)
[facility]) Care Management  12/27/2017  Diana Campbell 1945-12-07 [id number]   Medication Adherence call to Diana Campbell spoke with patient she is due on Lisinopril 5 mg she ask if we can call [facility] an order this medication , [facility] said she now has Lisinoprl 2.5 mg but [employer] does not cover 3 tablets a day they only cover 2 tablets daily [facility] will send for a prior authorization from the doctor. Diana Campbell is showing past due under [facility] Ins.   Lillia Abed CPhT Pharmacy Technician [facility] Care Management Direct Dial [phone]  Fax [phone] [email or web address]

## 2018-01-24 ENCOUNTER — Ambulatory Visit: Payer: Medicare Other | Admitting: "Endocrinology

## 2018-01-28 ENCOUNTER — Emergency Department (HOSPITAL_COMMUNITY): Payer: Medicare Other

## 2018-01-28 ENCOUNTER — Inpatient Hospital Stay (HOSPITAL_COMMUNITY)
Admission: EM | Admit: 2018-01-28 | Discharge: 2018-01-31 | DRG: 872 | Disposition: A | Discharge status: Home Health | Payer: Medicare Other | Attending: Internal Medicine | Admitting: Internal Medicine

## 2018-01-28 ENCOUNTER — Encounter (HOSPITAL_COMMUNITY): Payer: Self-pay | Admitting: Emergency Medicine

## 2018-01-28 ENCOUNTER — Other Ambulatory Visit: Payer: Self-pay

## 2018-01-28 ENCOUNTER — Encounter (HOSPITAL_COMMUNITY): Payer: Self-pay | Admitting: *Deleted

## 2018-01-28 DIAGNOSIS — Z95810 Presence of automatic (implantable) cardiac defibrillator: Secondary | ICD-10-CM | POA: Diagnosis present

## 2018-01-28 DIAGNOSIS — I110 Hypertensive heart disease with heart failure: Secondary | ICD-10-CM | POA: Diagnosis present

## 2018-01-28 DIAGNOSIS — Z6830 Body mass index (BMI) 30.0-30.9, adult: Secondary | ICD-10-CM

## 2018-01-28 DIAGNOSIS — E782 Mixed hyperlipidemia: Secondary | ICD-10-CM | POA: Diagnosis present

## 2018-01-28 DIAGNOSIS — N10 Acute tubulo-interstitial nephritis: Secondary | ICD-10-CM | POA: Diagnosis not present

## 2018-01-28 DIAGNOSIS — E118 Type 2 diabetes mellitus with unspecified complications: Secondary | ICD-10-CM

## 2018-01-28 DIAGNOSIS — R410 Disorientation, unspecified: Secondary | ICD-10-CM | POA: Diagnosis not present

## 2018-01-28 DIAGNOSIS — N390 Urinary tract infection, site not specified: Secondary | ICD-10-CM | POA: Diagnosis present

## 2018-01-28 DIAGNOSIS — I272 Pulmonary hypertension, unspecified: Secondary | ICD-10-CM | POA: Diagnosis present

## 2018-01-28 DIAGNOSIS — E669 Obesity, unspecified: Secondary | ICD-10-CM | POA: Diagnosis present

## 2018-01-28 DIAGNOSIS — E1165 Type 2 diabetes mellitus with hyperglycemia: Secondary | ICD-10-CM

## 2018-01-28 DIAGNOSIS — G629 Polyneuropathy, unspecified: Secondary | ICD-10-CM | POA: Diagnosis present

## 2018-01-28 DIAGNOSIS — Z794 Long term (current) use of insulin: Secondary | ICD-10-CM

## 2018-01-28 DIAGNOSIS — I428 Other cardiomyopathies: Secondary | ICD-10-CM

## 2018-01-28 DIAGNOSIS — I10 Essential (primary) hypertension: Secondary | ICD-10-CM | POA: Diagnosis present

## 2018-01-28 DIAGNOSIS — A408 Other streptococcal sepsis: Secondary | ICD-10-CM | POA: Diagnosis not present

## 2018-01-28 DIAGNOSIS — IMO0002 Reserved for concepts with insufficient information to code with codable children: Secondary | ICD-10-CM

## 2018-01-28 DIAGNOSIS — I5042 Chronic combined systolic (congestive) and diastolic (congestive) heart failure: Secondary | ICD-10-CM | POA: Diagnosis present

## 2018-01-28 DIAGNOSIS — M48061 Spinal stenosis, lumbar region without neurogenic claudication: Secondary | ICD-10-CM | POA: Diagnosis present

## 2018-01-28 DIAGNOSIS — R509 Fever, unspecified: Secondary | ICD-10-CM | POA: Diagnosis not present

## 2018-01-28 DIAGNOSIS — I2510 Atherosclerotic heart disease of native coronary artery without angina pectoris: Secondary | ICD-10-CM | POA: Diagnosis present

## 2018-01-28 DIAGNOSIS — A419 Sepsis, unspecified organism: Secondary | ICD-10-CM | POA: Diagnosis present

## 2018-01-28 DIAGNOSIS — M1990 Unspecified osteoarthritis, unspecified site: Secondary | ICD-10-CM | POA: Diagnosis present

## 2018-01-28 DIAGNOSIS — Z8674 Personal history of sudden cardiac arrest: Secondary | ICD-10-CM

## 2018-01-28 LAB — URINALYSIS, ROUTINE W REFLEX MICROSCOPIC
BILIRUBIN URINE: NEGATIVE
GLUCOSE, UA: 150 mg/dL — AB
HGB URINE DIPSTICK: NEGATIVE
KETONES UR: 20 mg/dL — AB
NITRITE: NEGATIVE
PROTEIN: NEGATIVE mg/dL
Specific Gravity, Urine: 1.025 (ref 1.005–1.030)
pH: 5 (ref 5.0–8.0)

## 2018-01-28 LAB — COMPREHENSIVE METABOLIC PANEL
ALT: 15 U/L (ref 0–44)
ANION GAP: 12 (ref 5–15)
AST: 20 U/L (ref 15–41)
Albumin: 4 g/dL (ref 3.5–5.0)
Alkaline Phosphatase: 64 U/L (ref 38–126)
BILIRUBIN TOTAL: 1.3 mg/dL — AB (ref 0.3–1.2)
BUN: 17 mg/dL (ref 8–23)
CHLORIDE: 102 mmol/L (ref 98–111)
CO2: 21 mmol/L — ABNORMAL LOW (ref 22–32)
Calcium: 9.9 mg/dL (ref 8.9–10.3)
Creatinine, Ser: 1.13 mg/dL — ABNORMAL HIGH (ref 0.44–1.00)
GFR calc Af Amer: 55 mL/min — ABNORMAL LOW (ref >60)
GFR calc non Af Amer: 48 mL/min — ABNORMAL LOW (ref >60)
Glucose, Bld: 240 mg/dL — ABNORMAL HIGH (ref 70–99)
POTASSIUM: 4.6 mmol/L (ref 3.5–5.1)
Sodium: 135 mmol/L (ref 135–145)
TOTAL PROTEIN: 8 g/dL (ref 6.5–8.1)

## 2018-01-28 LAB — I-STAT CG4 LACTIC ACID, ED: Lactic Acid, Venous: 1.38 mmol/L (ref 0.5–1.9)

## 2018-01-28 LAB — GLUCOSE, CAPILLARY
Glucose-Capillary: 203 mg/dL — ABNORMAL HIGH (ref 70–99)
Glucose-Capillary: 197 mg/dL — ABNORMAL HIGH (ref 70–99)

## 2018-01-28 LAB — CBC
HEMATOCRIT: 40 % (ref 36.0–46.0)
Hemoglobin: 12 g/dL (ref 12.0–15.0)
MCH: 23.6 pg — ABNORMAL LOW (ref 26.0–34.0)
MCHC: 30 g/dL (ref 30.0–36.0)
MCV: 78.7 fL — ABNORMAL LOW (ref 80.0–100.0)
NRBC: 0 % (ref 0.0–0.2)
PLATELETS: 262 10*3/uL (ref 150–400)
RBC: 5.08 MIL/uL (ref 3.87–5.11)
RDW: 14.4 % (ref 11.5–15.5)
WBC: 19.9 10*3/uL — ABNORMAL HIGH (ref 4.0–10.5)

## 2018-01-28 MED ORDER — VANCOMYCIN HCL 10 G IV SOLR
1500.0000 mg | Freq: Once | INTRAVENOUS | Status: DC
  Filled 2018-01-28: qty 1500

## 2018-01-28 MED ORDER — INSULIN GLARGINE 100 UNIT/ML ~~LOC~~ SOLN
20.0000 [IU] | Freq: Every day | SUBCUTANEOUS | Status: DC
  Administered 2018-01-28 – 2018-01-30 (×2): 20 [IU] via SUBCUTANEOUS
  Filled 2018-01-28 (×4): qty 0.2

## 2018-01-28 MED ORDER — SODIUM CHLORIDE 0.45 % IV SOLN
INTRAVENOUS | Status: DC
  Administered 2018-01-28 – 2018-01-29 (×2): via INTRAVENOUS

## 2018-01-28 MED ORDER — SODIUM CHLORIDE 0.9 % IV SOLN
2.0000 g | INTRAVENOUS | Status: DC
  Administered 2018-01-29 – 2018-01-30 (×2): 2 g via INTRAVENOUS
  Filled 2018-01-28 (×2): qty 20

## 2018-01-28 MED ORDER — ASPIRIN EC 81 MG PO TBEC
81.0000 mg | DELAYED_RELEASE_TABLET | Freq: Every day | ORAL | Status: DC
  Administered 2018-01-28 – 2018-01-31 (×4): 81 mg via ORAL
  Filled 2018-01-28 (×4): qty 1

## 2018-01-28 MED ORDER — ATORVASTATIN CALCIUM 10 MG PO TABS
10.0000 mg | ORAL_TABLET | Freq: Every day | ORAL | Status: DC
  Administered 2018-01-28 – 2018-01-30 (×2): 10 mg via ORAL
  Filled 2018-01-28 (×2): qty 1

## 2018-01-28 MED ORDER — TRAMADOL HCL 50 MG PO TABS
50.0000 mg | ORAL_TABLET | Freq: Four times a day (QID) | ORAL | Status: DC | PRN
  Administered 2018-01-28 – 2018-01-29 (×2): 50 mg via ORAL
  Filled 2018-01-28 (×2): qty 1

## 2018-01-28 MED ORDER — OXYCODONE HCL 5 MG PO TABS
5.0000 mg | ORAL_TABLET | Freq: Once | ORAL | Status: AC
  Administered 2018-01-28: 5 mg via ORAL
  Filled 2018-01-28: qty 1

## 2018-01-28 MED ORDER — SODIUM CHLORIDE 0.9 % IV SOLN
2.0000 g | Freq: Once | INTRAVENOUS | Status: AC
  Administered 2018-01-28: 2 g via INTRAVENOUS
  Filled 2018-01-28: qty 20

## 2018-01-28 MED ORDER — ONDANSETRON HCL 4 MG/2ML IJ SOLN: 4.0000 mg | Freq: Four times a day (QID) | INTRAMUSCULAR | Status: DC | PRN

## 2018-01-28 MED ORDER — INSULIN ASPART 100 UNIT/ML ~~LOC~~ SOLN
0.0000 [IU] | Freq: Three times a day (TID) | SUBCUTANEOUS | Status: DC
  Administered 2018-01-29: 2 [IU] via SUBCUTANEOUS
  Administered 2018-01-29: 1 [IU] via SUBCUTANEOUS
  Administered 2018-01-29: 2 [IU] via SUBCUTANEOUS
  Administered 2018-01-30 (×2): 3 [IU] via SUBCUTANEOUS
  Administered 2018-01-30 – 2018-01-31 (×2): 2 [IU] via SUBCUTANEOUS

## 2018-01-28 MED ORDER — FENTANYL CITRATE (PF) 100 MCG/2ML IJ SOLN
50.0000 ug | Freq: Once | INTRAMUSCULAR | Status: AC
  Administered 2018-01-28: 50 ug via INTRAVENOUS
  Filled 2018-01-28: qty 2

## 2018-01-28 MED ORDER — ONDANSETRON HCL 4 MG PO TABS: 4.0000 mg | ORAL_TABLET | Freq: Four times a day (QID) | ORAL | Status: DC | PRN

## 2018-01-28 MED ORDER — INSULIN ASPART 100 UNIT/ML ~~LOC~~ SOLN: 0.0000 [IU] | Freq: Every day | SUBCUTANEOUS | Status: DC

## 2018-01-28 MED ORDER — CARVEDILOL 6.25 MG PO TABS
6.2500 mg | ORAL_TABLET | Freq: Two times a day (BID) | ORAL | Status: DC
  Administered 2018-01-28 – 2018-01-31 (×5): 6.25 mg via ORAL
  Filled 2018-01-28 (×5): qty 1

## 2018-01-28 MED ORDER — ENOXAPARIN SODIUM 40 MG/0.4ML ~~LOC~~ SOLN
40.0000 mg | SUBCUTANEOUS | Status: DC
  Administered 2018-01-28 – 2018-01-30 (×3): 40 mg via SUBCUTANEOUS
  Filled 2018-01-28 (×3): qty 0.4

## 2018-01-28 MED ORDER — OXYCODONE HCL 5 MG PO TABS: 5.0000 mg | ORAL_TABLET | ORAL | Status: DC | PRN

## 2018-01-28 MED ORDER — IOPAMIDOL (ISOVUE-370) INJECTION 76%
100.0000 mL | Freq: Once | INTRAVENOUS | Status: AC | PRN
  Administered 2018-01-28: 100 mL via INTRAVENOUS

## 2018-01-28 MED ORDER — SODIUM CHLORIDE 0.9 % IV BOLUS
500.0000 mL | Freq: Once | INTRAVENOUS | Status: AC
  Administered 2018-01-28: 500 mL via INTRAVENOUS

## 2018-01-28 MED ORDER — ENSURE ENLIVE PO LIQD
237.0000 mL | Freq: Two times a day (BID) | ORAL | Status: DC
  Administered 2018-01-29 – 2018-01-31 (×5): 237 mL via ORAL
  Filled 2018-01-28 (×5): qty 237

## 2018-01-28 MED ORDER — ACETAMINOPHEN 325 MG PO TABS
650.0000 mg | ORAL_TABLET | Freq: Three times a day (TID) | ORAL | Status: DC
  Administered 2018-01-28 – 2018-01-31 (×7): 650 mg via ORAL
  Filled 2018-01-28 (×7): qty 2

## 2018-01-28 NOTE — ED Triage Notes (Signed)
Back pain and leg pain x 2 days , denies injury

## 2018-01-28 NOTE — ED Provider Notes (Signed)
[facility] Provider Note MRN:  [id number]  Arrival date & time: 01/28/18     Chief Complaint   Back Pain   History of Present Illness   Diana Campbell is a 73 y.o. year-old female with a history of diabetes, heart failure, ICD, hypertension presenting to the ED with chief complaint of back pain.  Patient has had chronic back pain since September since falling onto her left side.  Had plain films done at that time which were unremarkable.  Her pain is been slowly improving.  She woke up yesterday morning with significantly increased lower back pain, general malaise, has had great difficulty ambulating for the past 1 to 2 days due to weakness in the bilateral legs, left greater than right.  Denies numbness to the legs, has had some dysuria, some increased urination, no bowel dysfunction.  Feels subjective fever for the past 1 to 2 days, no cold-like symptoms, no cough, no chest pain or shortness of breath, no abdominal pain.  Review of Systems  A complete 10 system review of systems was obtained and all systems are negative except as noted in the HPI and PMH.   Patient's Health History    Past Medical History:  Diagnosis Date  . Arthritis   . Automatic implantable cardioverter-defibrillator in situ 07/2010  . Cardiac abnormality    with defibrilator  . Chronic combined systolic and diastolic CHF (congestive heart failure) (HCC) 09/04/2015  . Diabetes mellitus   . H/O cardiac arrest 2006  . Hypercholesteremia   . Hypertension   . Neuropathy     Past Surgical History:  Procedure Laterality Date  . 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  Marland Kitchen. CARDIAC CATHETERIZATION  04/14/2000   Normal coronary arteries.  Marland Kitchen. CARDIAC DEFIBRILLATOR PLACEMENT  07/21/2010   Medtronic Protecta XT DR, model#[id number],  serial#[id number]  . CARDIOVASCULAR STRESS TEST  04/05/2000   Scintigraphic evidence of mild LV dilatation and global LV dysfunction with an EF 41% with mild inferolateral ischemia.  Marland Kitchen. CARPAL TUNNEL RELEASE Right 01/23/2016   Procedure: CARPAL TUNNEL RELEASE;  Surgeon: Vickki HearingStanley E Harrison, MD;  Location: [facility] ORS;  Service: Orthopedics;  Laterality: Right;  pt knows to arrive at 6:15  . COLONOSCOPY  03/30/2011   Procedure: COLONOSCOPY;  Surgeon: Dalia HeadingMark A Jenkins, MD;  Location: [facility] ENDO SUITE;  Service: Gastroenterology;  Laterality: N/A;  . DORSAL COMPARTMENT RELEASE Right 08/11/2013   Procedure: RELEASE DORSAL COMPARTMENT (DEQUERVAIN);  Surgeon: Vickki HearingStanley E Harrison, MD;  Location: [facility] ORS;  Service: Orthopedics;  Laterality: Right;  . ICD GENERATOR CHANGEOUT N/A 10/29/2017   Procedure: ICD GENERATOR CHANGEOUT;  Surgeon: Thurmon Fairroitoru, Mihai, MD;  Location: [facility] INVASIVE CV LAB;  Service: Cardiovascular;  Laterality: N/A;  . TRANSTHORACIC ECHOCARDIOGRAM  08/03/2011   EF 45-50%, mildly reduced LV systolic function.    No family history on file.  Social History   Socioeconomic History  . Marital status: Married    Spouse name: Not on file  . Number of children: Not on file  . Years of education: Not on file  . Highest education level: Not on file  Occupational History  . Not on file  Social Needs  . Financial resource strain: Not on file  . Food insecurity:    Worry: Not on file    Inability: Not on file  . Transportation needs:    Medical: Not  on file    Non-medical: Not on file  Tobacco Use  . Smoking status: Never Smoker  . Smokeless tobacco: Current User    Types: Snuff  . Tobacco comment: 3 dips per day  Substance and Sexual Activity  . Alcohol use: No  . Drug use: No  . Sexual activity: Yes    Birth control/protection: None  Lifestyle  . Physical activity:    Days per week: Not on file    Minutes per session: Not on file  . Stress: Not on file  Relationships  . Social connections:     Talks on phone: Not on file    Gets together: Not on file    Attends religious service: Not on file    Active member of club or organization: Not on file    Attends meetings of clubs or organizations: Not on file    Relationship status: Not on file  . Intimate partner violence:    Fear of current or ex partner: Not on file    Emotionally abused: Not on file    Physically abused: Not on file    Forced sexual activity: Not on file  Other Topics Concern  . Not on file  Social History Narrative  . Not on file     Physical Exam  Vital Signs and Nursing Notes reviewed Vitals:   01/28/18 1645 01/28/18 1752  BP: (!) 169/74 (!) 169/66  Pulse: 84 88  Resp: 20 17  Temp:  98.4 F (36.9 C)  SpO2: 97% 94%    CONSTITUTIONAL: Well-appearing, NAD, appears tired NEURO:  Alert and oriented x 3, decreased strength to bilateral legs, left greater than right, left leg with diminished reflexes EYES:  eyes equal and reactive ENT/NECK:  no LAD, no JVD CARDIO: Regular rate, well-perfused, normal S1 and S2 PULM:  CTAB no wheezing or rhonchi GI/GU:  normal bowel sounds, non-distended, non-tender MSK/SPINE:  No gross deformities, no edema; tenderness to palpation to the midline thoracic and lumbar spine SKIN:  no rash, atraumatic PSYCH:  Appropriate speech and behavior  Diagnostic and Interventional Summary    EKG Interpretation  Date/Time:    Ventricular Rate:    PR Interval:    QRS Duration:   QT Interval:    QTC Calculation:   R Axis:     Text Interpretation:        Labs Reviewed  CBC - Abnormal; Notable for the following components:      Result Value   WBC 19.9 (*)    MCV 78.7 (*)    MCH 23.6 (*)    All other components within normal limits  COMPREHENSIVE METABOLIC PANEL - Abnormal; Notable for the following components:   CO2 21 (*)    Glucose, Bld 240 (*)    Creatinine, Ser 1.13 (*)    Total Bilirubin 1.3 (*)    GFR calc non Af Amer 48 (*)    GFR calc Af Amer 55 (*)     All other components within normal limits  URINALYSIS, ROUTINE W REFLEX MICROSCOPIC - Abnormal; Notable for the following components:   APPearance HAZY (*)    Glucose, UA 150 (*)    Ketones, ur 20 (*)    Leukocytes, UA SMALL (*)    Bacteria, UA MANY (*)    All other components within normal limits  GLUCOSE, CAPILLARY - Abnormal; Notable for the following components:   Glucose-Capillary 203 (*)    All other components within normal limits  CULTURE, BLOOD (ROUTINE  X 2)  CULTURE, BLOOD (ROUTINE X 2)  URINE CULTURE  TSH  BASIC METABOLIC PANEL  CBC  I-STAT CG4 LACTIC ACID, ED    CT THORACIC SPINE W CONTRAST  Final Result    CT LUMBAR SPINE W CONTRAST  Final Result    DG Chest 2 View  Final Result      Medications  oxyCODONE (Oxy IR/ROXICODONE) immediate release tablet 5-10 mg (has no administration in time range)  cefTRIAXone (ROCEPHIN) 2 g in sodium chloride 0.9 % 100 mL IVPB (has no administration in time range)  acetaminophen (TYLENOL) tablet 650 mg (has no administration in time range)  aspirin EC tablet 81 mg (has no administration in time range)  traMADol (ULTRAM) tablet 50 mg (has no administration in time range)  atorvastatin (LIPITOR) tablet 10 mg (has no administration in time range)  carvedilol (COREG) tablet 6.25 mg (has no administration in time range)  insulin glargine (LANTUS) injection 20 Units (has no administration in time range)  insulin aspart (novoLOG) injection 0-9 Units (has no administration in time range)  insulin aspart (novoLOG) injection 0-5 Units (has no administration in time range)  enoxaparin (LOVENOX) injection 40 mg (has no administration in time range)  0.45 % sodium chloride infusion (has no administration in time range)  ondansetron (ZOFRAN) tablet 4 mg (has no administration in time range)    Or  ondansetron (ZOFRAN) injection 4 mg (has no administration in time range)  oxyCODONE (Oxy IR/ROXICODONE) immediate release tablet 5 mg (5 mg Oral  Given 01/28/18 1139)  sodium chloride 0.9 % bolus 500 mL (0 mLs Intravenous Stopped 01/28/18 1249)  iopamidol (ISOVUE-370) 76 % injection 100 mL (100 mLs Intravenous Contrast Given 01/28/18 1439)  cefTRIAXone (ROCEPHIN) 2 g in sodium chloride 0.9 % 100 mL IVPB (0 g Intravenous Stopped 01/28/18 1548)  fentaNYL (SUBLIMAZE) injection 50 mcg (50 mcg Intravenous Given 01/28/18 1659)     Procedures Critical Care  ED Course and Medical Decision Making  I have reviewed the triage vital signs and the nursing notes.  Pertinent labs & imaging results that were available during my care of the patient were reviewed by me and considered in my medical decision making (see below for details).  Concern for possible epidural abscess in this 73 year old female with multiple comorbidities here with acute exacerbation of chronic low back pain, fever, leg weakness.  Also considering UTI as the etiology of the fever given patient's dysuria, however cannot exclude epidural abscess without MRI today.  Clinical Course as of Jan 28 1909  Fri Jan 28, 2018  1240 Patient's ICD is incompatible with MRI.  Will discuss imaging options with neurosurgery on-call.   [MB]  1403 Discussed imaging options with both neurosurgery as well as radiology, myelogram would be detrimental in this case, will proceed with CT T and L-spine with contrast.   [MB]  1449 UA with some evidence to suggest infection, will provide IV ceftriaxone and also provide IV vancomycin to empirically cover for epidural abscess.  CT imaging still pending.   [MB]  1611 CT with no evidence of epidural abscess.  Upon reevaluation, this weakness is perhaps more of a generalized weakness related to UTI or pyelonephritis.  Patient complaining of more of a cramping sensation to the right side of the back, continued pain, required 3 people to help her stand up and try to walk.  Unsafe at home given her current walking status, will consult for admission.   [MB]      Clinical Course  User Index [MB] Sabas Sous, MD     Accepted for admission by hospitalist service.  Elmer Sow. Pilar Plate, MD [facility] Emergency Medicine [facility] [email or web address]  Final Clinical Impressions(s) / ED Diagnoses     ICD-10-CM   1. Urinary tract infection without hematuria, site unspecified N39.0   2. Fever R50.9 DG Chest 2 View    DG Chest 2 View    ED Discharge Orders    None         Sabas Sous, MD 01/28/18 [phone]

## 2018-01-28 NOTE — H&P (Signed)
History and Physical   Diana Campbell OEV:[id number] DOB: 03/28/1945 DOA: 01/28/2018  Referring MD/NP/PA: Dr. Sedonia Small, EDP PCP: Alycia Rossetti, MD  Patient coming from: Home  Chief Complaint: Weakness, back pain  HPI: Diana Campbell is a 73 y.o. female with a history of combined CHF, IDT2DM, symptomatic bradycardia and VF arrest s/p ICD, HTN, HLD who presented to the ED with generalized weakness. She noted gradual onset of mild-moderate weakness in the legs which progressed to generalized weakness while doing laundry yesterday. Had worsening back pain overnight causing her to lose sleep and when she tried to get out of bed today, required assistance by her husband and granddaughter. She reports chronic low back/left hip and leg pain since a fall in Sept 2019 which has been getting somewhat better, but this pain is worsening in the bilateral back diffusely, nonradiating, constant, worse with movement or palpation, better with tylenol and medications given in ED. She reports 1 week of pain with urination which she attributed to drinking crush sodas and decreased appetite without abdominal pain, nausea, vomiting, or early satiety. She did not take any medications today and has not eaten since yesterday.  ED Course: Febrile to 100.24F, hypertensive and tachypneic with creatinine slightly up at 1.13 from baseline of 0.9, glucose elevated with ketones in urine. WBC 19.9k with pyuria and bacteriuria on clean catch urine. Lactic acid normal. Due to concern for epidural abscess, CT of the thoracic and lumbar spine performed without acute abnormalities including no abscess. This was reviewed with neurosurgery and radiology by EDP who felt no further imaging was indicated. When attempting to discharge the patient she was unable to help get herself up due to generalized weakness, so admission requested.  Review of Systems: +Chills but no fever, no chest pain, dyspnea, palpitations, orthopnea, myalgias,  arthralgias, localized weakness or numbness, and per HPI. All others reviewed and are negative.   Past Medical History:  Diagnosis Date  . Arthritis   . Automatic implantable cardioverter-defibrillator in situ 07/2010  . Cardiac abnormality    with defibrilator  . Chronic combined systolic and diastolic CHF (congestive heart failure) ([place]) 09/04/2015  . Diabetes mellitus   . H/O cardiac arrest 2006  . Hypercholesteremia   . Hypertension   . Neuropathy    Past Surgical History:  Procedure Laterality Date  . 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  Marland Kitchen CARDIAC CATHETERIZATION  04/14/2000   Normal coronary arteries.  Marland Kitchen CARDIAC DEFIBRILLATOR PLACEMENT  07/21/2010   Medtronic Protecta XT DR, model#D314DRG, serial#[id number] h  . CARDIOVASCULAR STRESS TEST  04/05/2000   Scintigraphic evidence of mild LV dilatation and global LV dysfunction with an EF 41% with mild inferolateral ischemia.  Marland Kitchen CARPAL TUNNEL RELEASE Right 01/23/2016   Procedure: CARPAL TUNNEL RELEASE;  Surgeon: Carole Civil, MD;  Location: [facility];  Service: Orthopedics;  Laterality: Right;  pt knows to arrive at 6:15  . COLONOSCOPY  03/30/2011   Procedure: COLONOSCOPY;  Surgeon: Jamesetta So, MD;  Location: [facility];  Service: Gastroenterology;  Laterality: N/A;  . DORSAL COMPARTMENT RELEASE Right 08/11/2013   Procedure: RELEASE DORSAL COMPARTMENT (DEQUERVAIN);  Surgeon: Carole Civil, MD;  Location: [facility];  Service: Orthopedics;  Laterality: Right;  . ICD GENERATOR CHANGEOUT N/A 10/29/2017   Procedure: ICD GENERATOR CHANGEOUT;  Surgeon: Sanda Klein, MD;  Location: [facility];  Service: Cardiovascular;  Laterality: N/A;  . TRANSTHORACIC ECHOCARDIOGRAM  08/03/2011   EF 45-50%, mildly reduced LV systolic function.   - Never smoker, no EtOH, lives with  husband   reports that she has never smoked. Her smokeless tobacco use includes snuff. She reports that she does not drink alcohol or use drugs. No Known Allergies No family history on file. - Family history otherwise reviewed and not pertinent.  Prior to Admission medications   Medication Sig Start Date End Date Taking? Authorizing Provider  acetaminophen (TYLENOL) 325 MG tablet Take 650 mg by mouth 3 (three) times daily.    Yes [provider]  aspirin 325 MG EC tablet Take 81 mg by mouth daily.   Yes [provider]  atorvastatin (LIPITOR) 10 MG tablet Take 10 mg by mouth at bedtime.  08/31/15  Yes [provider]  carvedilol (COREG) 12.5 MG tablet TAKE 1/2 TABLET BY MOUTH TWICE A DAY WITH A MEAL 10/18/17  Yes Croitoru, Mihai, MD  Cholecalciferol (VITAMIN D PO) Take 1 tablet by mouth daily.   Yes [provider]  Insulin Lispro Prot & Lispro (HUMALOG MIX 75/25 KWIKPEN [place]) Inject 30-35 Units into the skin 2 (two) times daily before a meal.    Yes [provider]  lisinopril (PRINIVIL,ZESTRIL) 5 MG tablet Take 2.5-5 mg by mouth See admin instructions. Take 5 mg by mouth in the morning and 2.5 at 4pm   Yes [provider]  traMADol (ULTRAM) 50 MG tablet Take 50 mg by mouth every 6 (six) hours as needed.   Yes [provider]  ACCU-CHEK AVIVA PLUS test strip USE TO TEST BLOOD SUGAR 4 TIMES A DAY (DX: E11.65) 12/20/17   Nida, Marella Chimes, MD  ACCU-CHEK FASTCLIX LANCETS MISC 1 each by Does not apply route 2 (two) times daily. 09/07/17   Cassandria Anger, MD  calcium-vitamin D (OSCAL WITH D) 500-200 MG-UNIT per tablet Take 1 tablet by mouth 2 (two) times daily.     [provider]  HUMALOG MIX 75/25 KWIKPEN (75-25) 100 UNIT/ML Kwikpen INJECT 40 UNITS INTO THE SKIN TWICE A DAY 11/18/17   Cassandria Anger, MD  hydrochlorothiazide (MICROZIDE) 12.5 MG capsule Take 1 capsule (12.5 mg total) by mouth as needed (ankle  swelling). 03/30/17 11/08/17  Troy Sine, MD  Lancets Misc. (ACCU-CHEK FASTCLIX LANCET) KIT 1 each by Does not apply route 2 (two) times daily. 09/07/17   Cassandria Anger, MD  lisinopril (PRINIVIL,ZESTRIL) 2.5 MG tablet TAKE 3 TABLETS BY MOUTH EVERY DAY Patient not taking: Reported on 01/28/2018 10/19/17   Troy Sine, MD  meloxicam (MOBIC) 15 MG tablet Take 15 mg by mouth daily. 03/14/17   [provider]    Physical Exam: Vitals:   01/28/18 1456 01/28/18 1520 01/28/18 1645 01/28/18 1752  BP:  (!) 168/64 (!) 169/74 (!) 169/66  Pulse:  90 84 88  Resp:  20 20 17   Temp:  99.3 F (37.4 C)  98.4 F (36.9 C)  TempSrc:  Oral  Oral  SpO2:  94% 97% 94%  Weight: 92 kg   90.3 kg   Constitutional: Tired-appearing 73yo female in no distress, calm demeanor Eyes: Lids and conjunctivae normal, PERRL ENMT: Mucous membranes are tacky. Posterior pharynx clear of any exudate or lesions. Poor dentition.  Neck: normal, supple, no masses, no thyromegaly Respiratory: Non-labored breathing room air without accessory muscle use. Clear breath sounds to auscultation bilaterally Cardiovascular: Regular rate and rhythm, no murmurs, rubs, or gallops. No carotid  bruits. No JVD. No LE edema. 2+ pedal pulses. Abdomen: Normoactive bowel sounds. +Suprapubic tenderness, no other tenderness, non-distended, and no masses palpated. No hepatosplenomegaly. GU: No indwelling catheter Musculoskeletal: No clubbing / cyanosis. No joint deformity upper and lower extremities. Good ROM, no contractures. Normal muscle tone.  Skin: Warm, dry. No rashes, wounds, or ulcers. No significant lesions noted.  Neurologic: CN II-XII grossly intact. Patient not entirely cooperative with exam, suspect effort-dependent weakness diffusely based on variable strength found with alternative maneuvers. No sensory deficits. Speech normal. No focal deficits in motor strength or sensation in all extremities.  Psychiatric: Alert and  oriented x3. Normal judgment and insight. Mood irritable   Labs on Admission: I have personally reviewed following labs and imaging studies  CBC: Recent Labs  Lab 01/28/18 1146  WBC 19.9*  HGB 12.0  HCT 40.0  MCV 78.7*  PLT 329   Basic Metabolic Panel: Recent Labs  Lab 01/28/18 1146  NA 135  K 4.6  CL 102  CO2 21*  GLUCOSE 240*  BUN 17  CREATININE 1.13*  CALCIUM 9.9   GFR: Estimated Creatinine Clearance: 50.4 mL/min (A) (by C-G formula based on SCr of 1.13 mg/dL (H)). Liver Function Tests: Recent Labs  Lab 01/28/18 1146  AST 20  ALT 15  ALKPHOS 64  BILITOT 1.3*  PROT 8.0  ALBUMIN 4.0   Urine analysis:    Component Value Date/Time   COLORURINE YELLOW 01/28/2018 1105   APPEARANCEUR HAZY (A) 01/28/2018 1105   LABSPEC 1.025 01/28/2018 1105   PHURINE 5.0 01/28/2018 1105   GLUCOSEU 150 (A) 01/28/2018 1105   HGBUR NEGATIVE 01/28/2018 1105   BILIRUBINUR NEGATIVE 01/28/2018 1105   KETONESUR 20 (A) 01/28/2018 1105   PROTEINUR NEGATIVE 01/28/2018 1105   UROBILINOGEN 0.2 06/12/2011 1415   NITRITE NEGATIVE 01/28/2018 1105   LEUKOCYTESUR SMALL (A) 01/28/2018 1105    Radiological Exams on Admission: Dg Chest 2 View  Result Date: 01/28/2018 CLINICAL DATA:  Patient with history of fever EXAM: CHEST - 2 VIEW COMPARISON:  Chest radiograph 03/23/2011 FINDINGS: Multi lead AICD device overlies the left hemithorax. Cardiomegaly. Low lung volumes. Bibasilar atelectasis. No pleural effusion or pneumothorax. Thoracic spine degenerative changes. IMPRESSION: Low lung volumes with basilar atelectasis. Cardiomegaly. Electronically Signed   By: Lovey Newcomer M.D.   On: 01/28/2018 11:27   Ct Thoracic Spine W Contrast  Result Date: 01/28/2018 CLINICAL DATA:  Back and leg pain x2 days, no known trauma, rule out epidural abscess EXAM: CT THORACIC SPINE WITH CONTRAST; CT LUMBAR SPINE WITH CONTRAST TECHNIQUE: Multidetector CT images of thoracic and lumbar spine obtained according to the  standard protocol following intravenous contrast administration. CONTRAST:  189m ISOVUE-370 IOPAMIDOL (ISOVUE-370) INJECTION 76% COMPARISON:  03/23/2011 FINDINGS: Alignment: Normal through the thoracic spine. Early grade 1 anterolisthesis L4-5. Vertebrae: 12 rib-bearing thoracic and 5 non rib-bearing lumbar segments. Negative for fracture. No pars defect. No focal bone lesion. Paraspinal and other soft tissues: No epidural hematoma. No peripherally enhancing epidural collection to suggest abscess. No paraspinal phlegmon. Left subclavian transvenous pacing leads partially visualized. Coronary, thoracic and abdominal aortic calcified plaque. 1.6 cm probable cyst, lower pole left kidney. Disc levels: C5-6: Disc narrowing with endplate spurring , probable central protrusion T1-2: Anterior and posterior endplate spurs. T2-L1: Bridging osteophytes across the interspaces with solid-appearing fusion L1-2: Large anterior endplate osteophytes. L2-4: Circumferential disc bulges. Bilateral facet DJD and thickening of the ligamentum flavum contributing to central canal stenosis of at least mild severity at both levels. L4-5: Vacuum phenomenon  in the interspace. Broad posterior disc bulge. Advanced bilateral facet DJD with some thickening of ligamentum flavum contributing to at least moderate central canal stenosis and left foraminal stenosis. L5-S1: Moderate bilateral facet DJD. No definite disc protrusion or spinal stenosis. IMPRESSION: 1. Negative for fracture, enhancing epidural abscess, canal hematoma, or other acute abnormality. 2. Multifactorial spinal stenosis of at least mild severity L2-3, L3-4, moderate L4-5. 3. Left foraminal stenosis L4-5 4. Advanced facet DJD L4-5 likely accounts for the mild grade 1 anterolisthesis at this level. 5. Coronary and Aortic Atherosclerosis (ICD10-170.0). Electronically Signed   By: Lucrezia Europe M.D.   On: 01/28/2018 15:51   Ct Lumbar Spine W Contrast  Result Date: 01/28/2018 CLINICAL  DATA:  Back and leg pain x2 days, no known trauma, rule out epidural abscess EXAM: CT THORACIC SPINE WITH CONTRAST; CT LUMBAR SPINE WITH CONTRAST TECHNIQUE: Multidetector CT images of thoracic and lumbar spine obtained according to the standard protocol following intravenous contrast administration. CONTRAST:  12m ISOVUE-370 IOPAMIDOL (ISOVUE-370) INJECTION 76% COMPARISON:  03/23/2011 FINDINGS: Alignment: Normal through the thoracic spine. Early grade 1 anterolisthesis L4-5. Vertebrae: 12 rib-bearing thoracic and 5 non rib-bearing lumbar segments. Negative for fracture. No pars defect. No focal bone lesion. Paraspinal and other soft tissues: No epidural hematoma. No peripherally enhancing epidural collection to suggest abscess. No paraspinal phlegmon. Left subclavian transvenous pacing leads partially visualized. Coronary, thoracic and abdominal aortic calcified plaque. 1.6 cm probable cyst, lower pole left kidney. Disc levels: C5-6: Disc narrowing with endplate spurring , probable central protrusion T1-2: Anterior and posterior endplate spurs. T2-L1: Bridging osteophytes across the interspaces with solid-appearing fusion L1-2: Large anterior endplate osteophytes. L2-4: Circumferential disc bulges. Bilateral facet DJD and thickening of the ligamentum flavum contributing to central canal stenosis of at least mild severity at both levels. L4-5: Vacuum phenomenon in the interspace. Broad posterior disc bulge. Advanced bilateral facet DJD with some thickening of ligamentum flavum contributing to at least moderate central canal stenosis and left foraminal stenosis. L5-S1: Moderate bilateral facet DJD. No definite disc protrusion or spinal stenosis. IMPRESSION: 1. Negative for fracture, enhancing epidural abscess, canal hematoma, or other acute abnormality. 2. Multifactorial spinal stenosis of at least mild severity L2-3, L3-4, moderate L4-5. 3. Left foraminal stenosis L4-5 4. Advanced facet DJD L4-5 likely accounts for  the mild grade 1 anterolisthesis at this level. 5. Coronary and Aortic Atherosclerosis (ICD10-170.0). Electronically Signed   By: DLucrezia EuropeM.D.   On: 01/28/2018 15:51   Assessment/Plan Principal Problem:   Acute pyelonephritis Active Problems:   Cardiomyopathy, nonischemic ([facility]   Uncontrolled type 2 diabetes mellitus with complication, with long-term current use of insulin ([facility])   Automatic implantable cardioverter-defibrillator in situ   Mixed hyperlipidemia   Essential hypertension, benign   Chronic combined systolic and diastolic CHF (congestive heart failure) ([facility]   Sepsis due to urinary tract infection ([facility]   Sepsis due to acute pyelonephritis: No abscess noted on CT dedicated for spinal imaging. WBC 19k, febrile, with tachypnea. Has had dysuria for 1 week and new back pain, bilateral CVA tenderness (not entirely specific with preexisting back pain), with significant pyuria and bacteriuria. Lactic acid normal, no hypotension. - Agree with ceftriaxone, will continue q24h - Send urine culture and blood cultures - Gentle IV fluids with CHF hx.   Chronic combined CHF: Echo March 2019 w/EF 40-45%, G1DD, normal RV function. Euvolemic - Continue home medications including aspirin, statin, coreg - I/O  T2DM: HbA1c 8.3%. Followed by endo, Dr. GConcha Se   -  Will substitute lantus qHS and SSI. Hyperglycemic on presentation due to not taking insulin today. Ketones more likely due to fasting state with mild hyperglycemia.   - Carb-modified diet  Generalized weakness: Reassuring exam, suspect due to sepsis. No CT findings to indicate neurological compromise. - PT/OT - Treat infection as above - TSH in AM  DVT prophylaxis: Lovenox  Code Status: Full  Family Communication: Husband, grand daughter at bedside Disposition Plan: Anticipate return to home upon discharge Consults called: None  Admission status: Observation    Patrecia Pour, MD [facility] [email or web address] Password [facility] 01/28/2018, 6:04 PM

## 2018-01-28 NOTE — ED Notes (Addendum)
Patient transported to X-ray 

## 2018-01-29 DIAGNOSIS — N390 Urinary tract infection, site not specified: Secondary | ICD-10-CM | POA: Diagnosis not present

## 2018-01-29 DIAGNOSIS — A419 Sepsis, unspecified organism: Secondary | ICD-10-CM | POA: Diagnosis not present

## 2018-01-29 DIAGNOSIS — I428 Other cardiomyopathies: Secondary | ICD-10-CM | POA: Diagnosis not present

## 2018-01-29 DIAGNOSIS — N10 Acute tubulo-interstitial nephritis: Secondary | ICD-10-CM | POA: Diagnosis not present

## 2018-01-29 LAB — BASIC METABOLIC PANEL
ANION GAP: 11 (ref 5–15)
BUN: 12 mg/dL (ref 8–23)
CO2: 22 mmol/L (ref 22–32)
Calcium: 9.5 mg/dL (ref 8.9–10.3)
Chloride: 104 mmol/L (ref 98–111)
Creatinine, Ser: 0.99 mg/dL (ref 0.44–1.00)
GFR calc Af Amer: >60 mL/min (ref >60)
GFR calc non Af Amer: 56 mL/min — ABNORMAL LOW (ref >60)
Glucose, Bld: 148 mg/dL — ABNORMAL HIGH (ref 70–99)
Potassium: 3.8 mmol/L (ref 3.5–5.1)
Sodium: 137 mmol/L (ref 135–145)

## 2018-01-29 LAB — CBC
HCT: 35.6 % — ABNORMAL LOW (ref 36.0–46.0)
Hemoglobin: 10.9 g/dL — ABNORMAL LOW (ref 12.0–15.0)
MCH: 23.7 pg — ABNORMAL LOW (ref 26.0–34.0)
MCHC: 30.6 g/dL (ref 30.0–36.0)
MCV: 77.6 fL — AB (ref 80.0–100.0)
PLATELETS: 231 10*3/uL (ref 150–400)
RBC: 4.59 MIL/uL (ref 3.87–5.11)
RDW: 14.4 % (ref 11.5–15.5)
WBC: 18.1 10*3/uL — ABNORMAL HIGH (ref 4.0–10.5)
nRBC: 0 % (ref 0.0–0.2)

## 2018-01-29 LAB — GLUCOSE, CAPILLARY
Glucose-Capillary: 143 mg/dL — ABNORMAL HIGH (ref 70–99)
Glucose-Capillary: 139 mg/dL — ABNORMAL HIGH (ref 70–99)
Glucose-Capillary: 147 mg/dL — ABNORMAL HIGH (ref 70–99)
Glucose-Capillary: 189 mg/dL — ABNORMAL HIGH (ref 70–99)
Glucose-Capillary: 237 mg/dL — ABNORMAL HIGH (ref 70–99)

## 2018-01-29 LAB — TSH: TSH: 1.683 u[IU]/mL (ref 0.350–4.500)

## 2018-01-29 MED ORDER — LORAZEPAM 0.5 MG PO TABS: 0.5000 mg | ORAL_TABLET | Freq: Once | ORAL | Status: DC

## 2018-01-29 MED ORDER — HYDROCODONE-ACETAMINOPHEN 5-325 MG PO TABS: 1.0000 | ORAL_TABLET | ORAL | Status: DC | PRN

## 2018-01-29 NOTE — Progress Notes (Signed)
Pt family at bedside requests MD to call tomorrow prior to pt discharge, contact information verified at bedside

## 2018-01-29 NOTE — Evaluation (Signed)
Occupational Therapy Evaluation Patient Details Name: Diana Campbell MRN: [id number] DOB: [birth year] Today's Date: 01/29/2018    History of Present Illness 73 y.o. female admitted on 01/28/18 for weakness.  Pt found to have sepsis due to acute pyelonephritis.  Pt with other significant PMH of neuropathy, HTN, cardiac arrest, DM, chronic combined systolic and diastolic CHF, implantable cardiac defibrillator, and multiple cardiac catheterizations.     Clinical Impression   Pt admitted with above. She demonstrates the below listed deficits and will benefit from continued OT to maximize safety and independence with BADLs.  Pt presents to OT with generalized weakness, decreased activity tolerance, impaired balance, and impaired cognition.  Pt currently requires max A for LB ADLs, and mod A for functional transfers - she had a significant LOB posteriorly requiring mod A to prevent fall.  BP sitting 125/58, HR 79; seated 131/57, HR 88.   Pt lives with spouse and their 75 y.o. grandson for whom they have custody.   She was independent with ADLs, but was fairly sedentary.  Recommend SNF as she is at high risk for falls. Will follow acutely.       Follow Up Recommendations  SNF;Supervision/Assistance - 24 hour    Equipment Recommendations  3 in 1 bedside commode    Recommendations for Other Services       Precautions / Restrictions Precautions Precautions: Fall Precaution Comments: h/o falls      Mobility Bed Mobility               General bed mobility comments: Pt sitting up in chair   Transfers Overall transfer level: Needs assistance Equipment used: Rolling walker (2 wheeled) Transfers: Sit to/from Stand Sit to Stand: Mod assist;Min assist         General transfer comment: Pt required mod A sit to stand x 1, and min A x1 to move into standing     Balance Overall balance assessment: Needs assistance Sitting-balance support: Feet supported;Bilateral upper extremity  supported Sitting balance-Leahy Scale: Poor Sitting balance - Comments: requires UE support    Standing balance support: Bilateral upper extremity supported Standing balance-Leahy Scale: Poor Standing balance comment: Pt with significant LOB posteriorly when standing required mod A to prevent fall                            ADL either performed or assessed with clinical judgement   ADL Overall ADL's : Needs assistance/impaired Eating/Feeding: Independent   Grooming: Wash/dry hands;Wash/dry face;Oral care;Brushing hair;Set up;Sitting   Upper Body Bathing: Set up;Supervision/ safety;Sitting   Lower Body Bathing: Maximal assistance;Sit to/from stand   Upper Body Dressing : Set up;Supervision/safety;Sitting   Lower Body Dressing: Sit to/from stand;Total assistance   Toilet Transfer: Moderate assistance;Stand-pivot;BSC;RW   Toileting- Clothing Manipulation and Hygiene: Maximal assistance;Sit to/from stand       Functional mobility during ADLs: Moderate assistance;Rolling walker General ADL Comments: Pt with limited activity tolerance and looses balance posteriorly      Vision Baseline Vision/History: Wears glasses Wears Glasses: At all times Patient Visual Report: No change from baseline       Perception     Praxis      Pertinent Vitals/Pain Pain Assessment: Faces Faces Pain Scale: Hurts little more Pain Location: right side (RLQ) Pain Descriptors / Indicators: Grimacing;Guarding Pain Intervention(s): Monitored during session     Hand Dominance Right   Extremity/Trunk Assessment Upper Extremity Assessment Upper Extremity Assessment: Generalized weakness   Lower Extremity  Assessment Lower Extremity Assessment: Defer to PT evaluation   Cervical / Trunk Assessment Cervical / Trunk Assessment: Other exceptions Cervical / Trunk Exceptions: Per pt report chronic back pain   Communication Communication Communication: No difficulties   Cognition  Arousal/Alertness: Awake/alert Behavior During Therapy: WFL for tasks assessed/performed Overall Cognitive Status: Impaired/Different from baseline Area of Impairment: Attention;Memory;Problem solving                   Current Attention Level: Sustained;Selective Memory: Decreased short-term memory       Problem Solving: Requires verbal cues;Requires tactile cues General Comments: Pt required information to be repeated, and , and at times tangential.  Mod cues for safety and judgement.  Pt reports her sister was here earlier and said she was "loopy"    General Comments  BP seated 125/79, HR 79; standing 131/57, HR 88      Exercises     Shoulder Instructions      Home Living Family/patient expects to be discharged to:: Private residence Living Arrangements: Spouse/significant other;Other relatives(75 year old grandson) Available Help at Discharge: Family;Available 24 hours/day Type of Home: House Home Access: Stairs to enter   Entrance Stairs-Rails: Right Home Layout: One level     Bathroom Shower/Tub: Tub/shower unit;Walk-in Human resources officershower   Bathroom Toilet: Standard     Home Equipment: Shower seat;Cane - single point;Walker - 2 wheels          Prior Functioning/Environment Level of Independence: Independent with assistive device(s)        Comments: uses cane.  caregiver for 5 y.o. grandson         OT Problem List: Decreased strength;Decreased activity tolerance;Impaired balance (sitting and/or standing);Decreased cognition;Decreased safety awareness;Decreased knowledge of use of DME or AE      OT Treatment/Interventions: Self-care/ADL training;DME and/or AE instruction;Therapeutic activities;Cognitive remediation/compensation;Patient/family education;Balance training    OT Goals(Current goals can be found in the care plan section) Acute Rehab OT Goals Patient Stated Goal: I've got people I have to take care of  OT Goal Formulation: With patient Time For Goal  Achievement: 02/12/18 Potential to Achieve Goals: Good ADL Goals Pt Will Perform Grooming: with min guard assist;standing Pt Will Perform Lower Body Bathing: with min guard assist;sit to/from stand Pt Will Perform Lower Body Dressing: with min guard assist;sit to/from stand Pt Will Transfer to Toilet: with min guard assist;ambulating;regular height toilet;bedside commode;grab bars Pt Will Perform Toileting - Clothing Manipulation and hygiene: with min guard assist;sit to/from stand  OT Frequency: Min 2X/week   Barriers to D/C:            Co-evaluation              AM-PAC OT "6 Clicks" Daily Activity     Outcome Measure Help from another person eating meals?: None Help from another person taking care of personal grooming?: A Little Help from another person toileting, which includes using toliet, bedpan, or urinal?: A Lot Help from another person bathing (including washing, rinsing, drying)?: A Lot Help from another person to put on and taking off regular upper body clothing?: A Little Help from another person to put on and taking off regular lower body clothing?: Total 6 Click Score: 15   End of Session Equipment Utilized During Treatment: Rolling walker Nurse Communication: Mobility status  Activity Tolerance: Patient limited by fatigue Patient left: in chair;with chair alarm set  OT Visit Diagnosis: Unsteadiness on feet (R26.81)  Time: 7824-2353 OT Time Calculation (min): 20 min Charges:     Jeani Hawking, OTR/L Acute Rehabilitation Services Pager [phone] Office [phone]   Jeani Hawking M 01/29/2018, 1:46 PM

## 2018-01-29 NOTE — Plan of Care (Signed)
  Problem: Coping: Goal: Level of anxiety will decrease Outcome: Not Progressing   Problem: Pain Managment: Goal: General experience of comfort will improve Outcome: Progressing   Problem: Clinical Measurements: Goal: Ability to maintain clinical measurements within normal limits will improve Outcome: Progressing

## 2018-01-29 NOTE — Progress Notes (Signed)
Progress Note    Diana Campbell  ZOX:[id number] DOB: 07/18/1945  DOA: 01/28/2018 PCP: Salley Scarleturham, Kawanta F, MD    Brief Narrative:   Chief complaint: Weakness and back pain  Medical records reviewed and are as summarized below:  Diana Campbell is an 73 y.o. female with past medical history significan tcombined CHF, IDT2DM, symptomatic bradycardia and VF arrest s/p ICD, HTN, and HLD; who presented with complaints of generalized malaise, weakness, and back pain.  Assessment/Plan:   Principal Problem:   Acute pyelonephritis Active Problems:   Cardiomyopathy, nonischemic (HCC)   Uncontrolled type 2 diabetes mellitus with complication, with long-term current use of insulin (HCC)   Automatic implantable cardioverter-defibrillator in situ   Mixed hyperlipidemia   Essential hypertension, benign   Chronic combined systolic and diastolic CHF (congestive heart failure) (HCC)   Sepsis due to urinary tract infection (HCC)   Sepsis secondary to suspected pyelonephritis: Acute.  Patient presented with white blood cell count of 19k, febrile up to 100.5 F, and tachypneic.  Patient reported having 1 week of dysuria and new back pain.  Physical exam had revealed bilateral tenderness.  UA positive for pyuria and bacteriuria.  Patient has been started on Rocephin. - Follow-up urine and blood cultures - Gentle IV fluids - Continue empiric antibiotics of Rocephin we will switch to p.o. in a.m. -Recheck CBC in a.m.  Back pain, history of spinal stenosis: Patient reports having back pain with radiation down both legs.  CT imaging showing multifactorial spinal stenosis, but no signs of an abscess.  At home patient previously taking tramadol with only minimal relief.  Oxycodone initially given, but patient noted to be "loopy" per family. - Changed oxycodone to hydrocodone - Continue physical therapy/OT  Essential hypertension: - Continue new Coreg  Chronic combined systolic and diastolic CHF:  Stable.  Patient appears to be euvolemic at this time.  Last echocardiogram showing EF of 40 to 45% in March 2019 with grade 1 diastolic dysfunction and normal RV function. - Continue to monitor intake and output - Continue aspirin, statin, Coreg  Diabetes mellitus type 2: Last hemoglobin A1c noted to be 8.3.  Patient followed by Dr.Gebreselassie in the outpatient setting. - Substitution for Lantus - CBGs q. before meals and at bedtime with     Body mass index is 31.18 kg/m.   Family Communication/Anticipated D/C date and plan/Code Status   DVT prophylaxis: Lovenox ordered. Code Status: Full Code.  Family Communication: Discussed plan of care with patient and family present at bedside Disposition Plan: Likely discharge home in a.m.   Medical Consultants:    None.   Anti-Infectives:    Rocephin day 2  Subjective:   Patient reports that her back pain radiates down both legs.  States that she does not want to take gabapentin.  Objective:    Vitals:   01/28/18 1645 01/28/18 1752 01/28/18 2356 01/29/18 0621  BP: (!) 169/74 (!) 169/66 137/66 137/60  Pulse: 84 88 77 78  Resp: 20 17 18 18   Temp:  98.4 F (36.9 C) 99.6 F (37.6 C) 99.2 F (37.3 C)  TempSrc:  Oral Oral Oral  SpO2: 97% 94% 94% 94%  Weight:  90.3 kg      Intake/Output Summary (Last 24 hours) at 01/29/2018 0650 Last data filed at 01/29/2018 0648 Gross per 24 hour  Intake 1105.14 ml  Output 800 ml  Net 305.14 ml   Filed Weights   01/28/18 1456 01/28/18 1752  Weight: 92 kg 90.3  kg    Exam: Constitutional: Elderly female currently in NAD, calm, comfortable Eyes: PERRL, lids and conjunctivae normal ENMT: Mucous membranes are moist. Posterior pharynx clear of any exudate or lesions.  Neck: normal, supple, no masses, no thyromegaly Respiratory: clear to auscultation bilaterally, no wheezing, no crackles. Normal respiratory effort. No accessory muscle use.  Cardiovascular: Regular rate and rhythm,  no murmurs / rubs / gallops. No extremity edema. 2+ pedal pulses. No carotid bruits.  Abdomen: no tenderness, no masses palpated. No hepatosplenomegaly. Bowel sounds positive.  Musculoskeletal: no clubbing / cyanosis.  Tenderness noted of the lumbar spine.  Raising legs from chair reproduces symptoms. Skin: no rashes, lesions, ulcers. No induration Neurologic: CN 2-12 grossly intact. Sensation intact, DTR normal. Strength 5/5 in all 4.  Psychiatric: Normal judgment and insight. Alert and oriented x 3. Normal mood.    Data Reviewed:   I have personally reviewed following labs and imaging studies:  Labs: Labs show the following:   Basic Metabolic Panel: Recent Labs  Lab 01/28/18 1146  NA 135  K 4.6  CL 102  CO2 21*  GLUCOSE 240*  BUN 17  CREATININE 1.13*  CALCIUM 9.9   GFR Estimated Creatinine Clearance: 50.4 mL/min (A) (by C-G formula based on SCr of 1.13 mg/dL (H)). Liver Function Tests: Recent Labs  Lab 01/28/18 1146  AST 20  ALT 15  ALKPHOS 64  BILITOT 1.3*  PROT 8.0  ALBUMIN 4.0   No results for input(s): LIPASE, AMYLASE in the last 168 hours. No results for input(s): AMMONIA in the last 168 hours. Coagulation profile No results for input(s): INR, PROTIME in the last 168 hours.  CBC: Recent Labs  Lab 01/28/18 1146 01/29/18 0552  WBC 19.9* 18.1*  HGB 12.0 10.9*  HCT 40.0 35.6*  MCV 78.7* 77.6*  PLT 262 231   Cardiac Enzymes: No results for input(s): CKTOTAL, CKMB, CKMBINDEX, TROPONINI in the last 168 hours. BNP (last 3 results) No results for input(s): PROBNP in the last 8760 hours. CBG: Recent Labs  Lab 01/28/18 1819 01/28/18 2207 01/29/18 0608  GLUCAP 203* 197* 143*   D-Dimer: No results for input(s): DDIMER in the last 72 hours. Hgb A1c: No results for input(s): HGBA1C in the last 72 hours. Lipid Profile: No results for input(s): CHOL, HDL, LDLCALC, TRIG, CHOLHDL, LDLDIRECT in the last 72 hours. Thyroid function studies: No results for  input(s): TSH, T4TOTAL, T3FREE, THYROIDAB in the last 72 hours.  Invalid input(s): FREET3 Anemia work up: No results for input(s): VITAMINB12, FOLATE, FERRITIN, TIBC, IRON, RETICCTPCT in the last 72 hours. Sepsis Labs: Recent Labs  Lab 01/28/18 1146 01/28/18 1156 01/29/18 0552  WBC 19.9*  --  18.1*  LATICACIDVEN  --  1.38  --     Microbiology No results found for this or any previous visit (from the past 240 hour(s)).  Procedures and diagnostic studies:  Dg Chest 2 View  Result Date: 01/28/2018 CLINICAL DATA:  Patient with history of fever EXAM: CHEST - 2 VIEW COMPARISON:  Chest radiograph 03/23/2011 FINDINGS: Multi lead AICD device overlies the left hemithorax. Cardiomegaly. Low lung volumes. Bibasilar atelectasis. No pleural effusion or pneumothorax. Thoracic spine degenerative changes. IMPRESSION: Low lung volumes with basilar atelectasis. Cardiomegaly. Electronically Signed   By: Annia Beltrew  Davis M.D.   On: 01/28/2018 11:27   Ct Thoracic Spine W Contrast  Result Date: 01/28/2018 CLINICAL DATA:  Back and leg pain x2 days, no known trauma, rule out epidural abscess EXAM: CT THORACIC SPINE WITH CONTRAST; CT LUMBAR  SPINE WITH CONTRAST TECHNIQUE: Multidetector CT images of thoracic and lumbar spine obtained according to the standard protocol following intravenous contrast administration. CONTRAST:  ISOVUE-370 IOPAMIDOL (ISOVUE-370) INJECTION 76% COMPARISON:  03/23/2011 FINDINGS: Alignment: Normal through the thoracic spine. Early grade 1 anterolisthesis L4-5. Vertebrae: 12 rib-bearing thoracic and 5 non rib-bearing lumbar segments. Negative for fracture. No pars defect. No focal bone lesion. Paraspinal and other soft tissues: No epidural hematoma. No peripherally enhancing epidural collection to suggest abscess. No paraspinal phlegmon. Left subclavian transvenous pacing leads partially visualized. Coronary, thoracic and abdominal aortic calcified plaque. 1.6 cm probable cyst, lower pole  left kidney. Disc levels: C5-6: Disc narrowing with endplate spurring , probable central protrusion T1-2: Anterior and posterior endplate spurs. T2-L1: Bridging osteophytes across the interspaces with solid-appearing fusion L1-2: Large anterior endplate osteophytes. L2-4: Circumferential disc bulges. Bilateral facet DJD and thickening of the ligamentum flavum contributing to central canal stenosis of at least mild severity at both levels. L4-5: Vacuum phenomenon in the interspace. Broad posterior disc bulge. Advanced bilateral facet DJD with some thickening of ligamentum flavum contributing to at least moderate central canal stenosis and left foraminal stenosis. L5-S1: Moderate bilateral facet DJD. No definite disc protrusion or spinal stenosis. IMPRESSION: 1. Negative for fracture, enhancing epidural abscess, canal hematoma, or other acute abnormality. 2. Multifactorial spinal stenosis of at least mild severity L2-3, L3-4, moderate L4-5. 3. Left foraminal stenosis L4-5 4. Advanced facet DJD L4-5 likely accounts for the mild grade 1 anterolisthesis at this level. 5. Coronary and Aortic Atherosclerosis (ICD10-170.0). Electronically Signed   By: Corlis Leak M.D.   On: 01/28/2018 15:51   Ct Lumbar Spine W Contrast  Result Date: 01/28/2018 CLINICAL DATA:  Back and leg pain x2 days, no known trauma, rule out epidural abscess EXAM: CT THORACIC SPINE WITH CONTRAST; CT LUMBAR SPINE WITH CONTRAST TECHNIQUE: Multidetector CT images of thoracic and lumbar spine obtained according to the standard protocol following intravenous contrast administration. CONTRAST:  ISOVUE-370 IOPAMIDOL (ISOVUE-370) INJECTION 76% COMPARISON:  03/23/2011 FINDINGS: Alignment: Normal through the thoracic spine. Early grade 1 anterolisthesis L4-5. Vertebrae: 12 rib-bearing thoracic and 5 non rib-bearing lumbar segments. Negative for fracture. No pars defect. No focal bone lesion. Paraspinal and other soft tissues: No epidural hematoma. No  peripherally enhancing epidural collection to suggest abscess. No paraspinal phlegmon. Left subclavian transvenous pacing leads partially visualized. Coronary, thoracic and abdominal aortic calcified plaque. 1.6 cm probable cyst, lower pole left kidney. Disc levels: C5-6: Disc narrowing with endplate spurring , probable central protrusion T1-2: Anterior and posterior endplate spurs. T2-L1: Bridging osteophytes across the interspaces with solid-appearing fusion L1-2: Large anterior endplate osteophytes. L2-4: Circumferential disc bulges. Bilateral facet DJD and thickening of the ligamentum flavum contributing to central canal stenosis of at least mild severity at both levels. L4-5: Vacuum phenomenon in the interspace. Broad posterior disc bulge. Advanced bilateral facet DJD with some thickening of ligamentum flavum contributing to at least moderate central canal stenosis and left foraminal stenosis. L5-S1: Moderate bilateral facet DJD. No definite disc protrusion or spinal stenosis. IMPRESSION: 1. Negative for fracture, enhancing epidural abscess, canal hematoma, or other acute abnormality. 2. Multifactorial spinal stenosis of at least mild severity L2-3, L3-4, moderate L4-5. 3. Left foraminal stenosis L4-5 4. Advanced facet DJD L4-5 likely accounts for the mild grade 1 anterolisthesis at this level. 5. Coronary and Aortic Atherosclerosis (ICD10-170.0). Electronically Signed   By: Corlis Leak M.D.   On: 01/28/2018 15:51    Medications:   . acetaminophen  650 mg  Oral TID  . aspirin  81 mg Oral Daily  . atorvastatin  10 mg Oral QHS  . carvedilol  6.25 mg Oral BID  . enoxaparin (LOVENOX) injection  40 mg Subcutaneous Q24H  . feeding supplement (ENSURE ENLIVE)  237 mL Oral BID BM  . insulin aspart  0-5 Units Subcutaneous QHS  . insulin aspart  0-9 Units Subcutaneous TID WC  . insulin glargine  20 Units Subcutaneous QHS   Continuous Infusions: . sodium chloride 50 mL/hr at 01/28/18 1914  . cefTRIAXone  (ROCEPHIN)  IV       LOS: 0 days   Terrace Fontanilla A Camil Hausmann  [facility]   *Please refer to [email or web address], password [email or web address] to get updated schedule on who will round on this patient, as hospitalists switch teams weekly. If 7PM-7AM, please contact night-coverage at [email or web address], password [email or web address] for any overnight needs.

## 2018-01-29 NOTE — Evaluation (Signed)
Physical Therapy Evaluation Patient Details Name: Diana Campbell MRN: [id number] DOB: Aug 10, 1945 Today's Date: 01/29/2018   History of Present Illness  73 y.o. female admitted on 01/28/18 for weakness.  Pt found to have sepsis due to acute pyelonephritis.  Pt with other significant PMH of neuropathy, HTN, cardiac arrest, DM, chronic combined systolic and diastolic CHF, implantable cardiac defibrillator, and multiple cardiac catheterizations.    Clinical Impression  Pt is very weak and deconditioned.  She was not very active at baseline (suspect due to fear of falling as she fell coming out of the house several years ago).  She would benefit from SNF level rehab at discharge if she qualifies as she needs constant hands on assist for all mobility and was sweating profusely with short distance gait (HR and O2 stable on RA).  If she cannot qualify for SNF level rehab max HH services recommended (PT, OT, aide).   PT to follow acutely for deficits listed below.      Follow Up Recommendations SNF;Supervision/Assistance - 24 hour(pt will need hands on assist at discharge)    Equipment Recommendations  None recommended by PT    Recommendations for Other Services   NA    Precautions / Restrictions Precautions Precautions: Fall Precaution Comments: h/o falls      Mobility  Bed Mobility Overal bed mobility: Needs Assistance Bed Mobility: Supine to Sit     Supine to sit: Mod assist;HOB elevated     General bed mobility comments: Mod assist to support trunk and help progress hips to EOB.  Significant extra time needed to complete task as pt had to rest multiple times.   Transfers Overall transfer level: Needs assistance Equipment used: Rolling walker (2 wheeled) Transfers: Sit to/from Stand Sit to Stand: Min assist;From elevated surface         General transfer comment: Min assist from maximally elevated surface.  Cues for safe hand placement.   Ambulation/Gait Ambulation/Gait  assistance: Min assist Gait Distance (Feet): 50 Feet Assistive device: Rolling walker (2 wheeled) Gait Pattern/deviations: Step-through pattern;Shuffle Gait velocity: decreased Gait velocity interpretation: <1.31 ft/sec, indicative of household ambulator General Gait Details: Pt needs max cues for safety as she kept letting go of RW and reaching for hallway railing.  She had very limited endurance for gait and became very sweaty.  HR and O2 sats were stable on RA.           Balance Overall balance assessment: Needs assistance Sitting-balance support: Feet supported;Bilateral upper extremity supported Sitting balance-Leahy Scale: Poor Sitting balance - Comments: close supervision EOB.     Standing balance support: Bilateral upper extremity supported Standing balance-Leahy Scale: Poor Standing balance comment: needs external support for balance.                               Pertinent Vitals/Pain Pain Assessment: Faces Faces Pain Scale: Hurts little more Pain Location: right side (RLQ) Pain Descriptors / Indicators: Grimacing;Guarding Pain Intervention(s): Limited activity within patient's tolerance;Monitored during session;Repositioned    Home Living Family/patient expects to be discharged to:: Private residence Living Arrangements: Spouse/significant other;Other relatives(70 year old grandson) Available Help at Discharge: Family;Available 24 hours/day Type of Home: House Home Access: Stairs to enter Entrance Stairs-Rails: Right   Home Layout: One level Home Equipment: Shower seat;Cane - single point;Walker - 2 wheels      Prior Function Level of Independence: Independent with assistive device(s)  Comments: uses cane     Hand Dominance   Dominant Hand: Right    Extremity/Trunk Assessment   Upper Extremity Assessment Upper Extremity Assessment: Defer to OT evaluation    Lower Extremity Assessment Lower Extremity Assessment: Generalized  weakness    Cervical / Trunk Assessment Cervical / Trunk Assessment: Other exceptions Cervical / Trunk Exceptions: Per pt report chronic back pain  Communication   Communication: No difficulties  Cognition Arousal/Alertness: Awake/alert Behavior During Therapy: WFL for tasks assessed/performed Overall Cognitive Status: Within Functional Limits for tasks assessed                                 General Comments: She is alert and oriented, but some things she says are a bit off (not related to current coversation)      General Comments General comments (skin integrity, edema, etc.): Per pt report she was not active at baseline since her fall a few years ago (mostly short household distances), gets fatigued walking to the mailbox, so no longer walks to the mailbox.          Assessment/Plan    PT Assessment Patient needs continued PT services  PT Problem List Decreased strength;Decreased activity tolerance;Decreased balance;Decreased mobility;Decreased knowledge of use of DME;Pain;Decreased safety awareness       PT Treatment Interventions DME instruction;Gait training;Stair training;Functional mobility training;Therapeutic activities;Therapeutic exercise;Balance training;Cognitive remediation;Wheelchair mobility training;Patient/family education;Modalities    PT Goals (Current goals can be found in the Care Plan section)  Acute Rehab PT Goals Patient Stated Goal: to get back to careing for her 60 y.o grandson PT Goal Formulation: With patient Time For Goal Achievement: 02/12/18 Potential to Achieve Goals: Good    Frequency Min 2X/week           AM-PAC PT "6 Clicks" Mobility  Outcome Measure Help needed turning from your back to your side while in a flat bed without using bedrails?: A Lot Help needed moving from lying on your back to sitting on the side of a flat bed without using bedrails?: A Lot Help needed moving to and from a bed to a chair (including a  wheelchair)?: A Little Help needed standing up from a chair using your arms (e.g., wheelchair or bedside chair)?: A Little Help needed to walk in hospital room?: A Little Help needed climbing 3-5 steps with a railing? : A Lot 6 Click Score: 15    End of Session Equipment Utilized During Treatment: Gait belt Activity Tolerance: Patient limited by fatigue Patient left: in chair;with call bell/phone within reach;with chair alarm set Nurse Communication: Mobility status PT Visit Diagnosis: Muscle weakness (generalized) (M62.81);Difficulty in walking, not elsewhere classified (R26.2);Pain Pain - Right/Left: Right Pain - part of body: (lower quadrant)    Time: 9163-8466 PT Time Calculation (min) (ACUTE ONLY): 34 min   Charges:          Lurena Joiner B. Byard Carranza, PT, DPT  Acute Rehabilitation [phone] pager #[phone] office   PT Evaluation $PT Eval Moderate Complexity: 1 Mod PT Treatments $Gait Training: 8-22 mins       01/29/2018, 10:02 AM

## 2018-01-30 DIAGNOSIS — N390 Urinary tract infection, site not specified: Secondary | ICD-10-CM | POA: Diagnosis present

## 2018-01-30 DIAGNOSIS — I428 Other cardiomyopathies: Secondary | ICD-10-CM | POA: Diagnosis present

## 2018-01-30 DIAGNOSIS — A419 Sepsis, unspecified organism: Secondary | ICD-10-CM | POA: Diagnosis not present

## 2018-01-30 DIAGNOSIS — R509 Fever, unspecified: Secondary | ICD-10-CM | POA: Diagnosis present

## 2018-01-30 DIAGNOSIS — M48061 Spinal stenosis, lumbar region without neurogenic claudication: Secondary | ICD-10-CM | POA: Diagnosis present

## 2018-01-30 DIAGNOSIS — I2510 Atherosclerotic heart disease of native coronary artery without angina pectoris: Secondary | ICD-10-CM | POA: Diagnosis present

## 2018-01-30 DIAGNOSIS — I110 Hypertensive heart disease with heart failure: Secondary | ICD-10-CM | POA: Diagnosis present

## 2018-01-30 DIAGNOSIS — Z95810 Presence of automatic (implantable) cardiac defibrillator: Secondary | ICD-10-CM | POA: Diagnosis not present

## 2018-01-30 DIAGNOSIS — N10 Acute tubulo-interstitial nephritis: Secondary | ICD-10-CM | POA: Diagnosis present

## 2018-01-30 DIAGNOSIS — A408 Other streptococcal sepsis: Secondary | ICD-10-CM | POA: Diagnosis present

## 2018-01-30 DIAGNOSIS — I5042 Chronic combined systolic (congestive) and diastolic (congestive) heart failure: Secondary | ICD-10-CM | POA: Diagnosis present

## 2018-01-30 DIAGNOSIS — I272 Pulmonary hypertension, unspecified: Secondary | ICD-10-CM | POA: Diagnosis present

## 2018-01-30 DIAGNOSIS — M1990 Unspecified osteoarthritis, unspecified site: Secondary | ICD-10-CM | POA: Diagnosis present

## 2018-01-30 DIAGNOSIS — Z6830 Body mass index (BMI) 30.0-30.9, adult: Secondary | ICD-10-CM | POA: Diagnosis not present

## 2018-01-30 DIAGNOSIS — Z8674 Personal history of sudden cardiac arrest: Secondary | ICD-10-CM | POA: Diagnosis not present

## 2018-01-30 DIAGNOSIS — E782 Mixed hyperlipidemia: Secondary | ICD-10-CM | POA: Diagnosis present

## 2018-01-30 DIAGNOSIS — I10 Essential (primary) hypertension: Secondary | ICD-10-CM | POA: Diagnosis not present

## 2018-01-30 DIAGNOSIS — R410 Disorientation, unspecified: Secondary | ICD-10-CM | POA: Diagnosis not present

## 2018-01-30 DIAGNOSIS — E669 Obesity, unspecified: Secondary | ICD-10-CM | POA: Diagnosis present

## 2018-01-30 DIAGNOSIS — G629 Polyneuropathy, unspecified: Secondary | ICD-10-CM | POA: Diagnosis present

## 2018-01-30 LAB — GLUCOSE, CAPILLARY
Glucose-Capillary: 164 mg/dL — ABNORMAL HIGH (ref 70–99)
Glucose-Capillary: 193 mg/dL — ABNORMAL HIGH (ref 70–99)
Glucose-Capillary: 197 mg/dL — ABNORMAL HIGH (ref 70–99)
Glucose-Capillary: 201 mg/dL — ABNORMAL HIGH (ref 70–99)
Glucose-Capillary: 208 mg/dL — ABNORMAL HIGH (ref 70–99)
Glucose-Capillary: 217 mg/dL — ABNORMAL HIGH (ref 70–99)

## 2018-01-30 LAB — BASIC METABOLIC PANEL
Anion gap: 10 (ref 5–15)
BUN: 11 mg/dL (ref 8–23)
CO2: 24 mmol/L (ref 22–32)
Calcium: 9.6 mg/dL (ref 8.9–10.3)
Chloride: 102 mmol/L (ref 98–111)
Creatinine, Ser: 0.86 mg/dL (ref 0.44–1.00)
GFR calc Af Amer: >60 mL/min (ref >60)
GFR calc non Af Amer: >60 mL/min (ref >60)
Glucose, Bld: 215 mg/dL — ABNORMAL HIGH (ref 70–99)
Potassium: 3.6 mmol/L (ref 3.5–5.1)
Sodium: 136 mmol/L (ref 135–145)

## 2018-01-30 LAB — CBC WITH DIFFERENTIAL/PLATELET
Abs Immature Granulocytes: 0.08 10*3/uL — ABNORMAL HIGH (ref 0.00–0.07)
Basophils Absolute: 0.1 10*3/uL (ref 0.0–0.1)
Basophils Relative: 1 %
EOS ABS: 0 10*3/uL (ref 0.0–0.5)
Eosinophils Relative: 0 %
HCT: 36.2 % (ref 36.0–46.0)
Hemoglobin: 11.2 g/dL — ABNORMAL LOW (ref 12.0–15.0)
Immature Granulocytes: 1 %
LYMPHS ABS: 3.4 10*3/uL (ref 0.7–4.0)
Lymphocytes Relative: 21 %
MCH: 23.9 pg — ABNORMAL LOW (ref 26.0–34.0)
MCHC: 30.9 g/dL (ref 30.0–36.0)
MCV: 77.4 fL — ABNORMAL LOW (ref 80.0–100.0)
Monocytes Absolute: 1.7 10*3/uL — ABNORMAL HIGH (ref 0.1–1.0)
Monocytes Relative: 10 %
Neutro Abs: 11.1 10*3/uL — ABNORMAL HIGH (ref 1.7–7.7)
Neutrophils Relative %: 67 %
Platelets: 255 10*3/uL (ref 150–400)
RBC: 4.68 MIL/uL (ref 3.87–5.11)
RDW: 14.2 % (ref 11.5–15.5)
WBC: 16.4 10*3/uL — ABNORMAL HIGH (ref 4.0–10.5)
nRBC: 0 % (ref 0.0–0.2)

## 2018-01-30 NOTE — Progress Notes (Signed)
Occupational Therapy Treatment Patient Details Name: Diana Campbell MRN: [id number] DOB: 1945-10-02 Today's Date: 01/30/2018    History of present illness 73 y.o. female admitted on 01/28/18 for weakness.  Pt found to have sepsis due to acute pyelonephritis.  Pt with other significant PMH of neuropathy, HTN, cardiac arrest, DM, chronic combined systolic and diastolic CHF, implantable cardiac defibrillator, and multiple cardiac catheterizations.     OT comments  Pt. Seen for skilled therapy with focus on ADLS.  Family present for session and active in her care.  Pt. With continued notable confusion but attempts to mask it with humor.  Improved mobility from previously doc. Sessions. No LOB noted but pt. Still states she is so fatigued and reels like she could "give out".  Agree with current POC and will cont. To follow acutely.    Follow Up Recommendations  SNF;Supervision/Assistance - 24 hour    Equipment Recommendations  3 in 1 bedside commode    Recommendations for Other Services      Precautions / Restrictions Precautions Precautions: Fall       Mobility Bed Mobility               General bed mobility comments: Pt sitting up in chair   Transfers Overall transfer level: Needs assistance Equipment used: Rolling walker (2 wheeled) Transfers: Sit to/from [employer] Sit to Stand: Min guard Stand pivot transfers: Min guard       General transfer comment: no physical assistnace required during tansfers.  no lob noted this day    Balance                                           ADL either performed or assessed with clinical judgement   ADL Overall ADL's : Needs assistance/impaired     Grooming: Wash/dry hands;Min guard;Standing                   Toilet Transfer: TEFL teacher Details (indicate cue type and reason): 3n1 over the commode Toileting- Clothing Manipulation and  Hygiene: Set up;Sitting/lateral lean;Sit to/from stand       Functional mobility during ADLs: Min guard;Rolling walker General ADL Comments: pt. with decreased physical assistance required this day for skilled therapy sessin.  no lob noted during mobility or standing grooming tasks.  pt. did complain of "feeling so tired and wore out".  reviewed with pt. and family that ws present the need for rest breaks and actual rest while here. pt. with large family present. reviewed she will want to stay and visit wtih them and she needs to be able to tell them when she needs rest. verbalized understanding.  reviewed schecule to be up before, during and after meals but then heading to bed and resting in between.  family and pt. verbalized understanding      Vision       Perception     Praxis      Cognition Arousal/Alertness: Awake/alert Behavior During Therapy: WFL for tasks assessed/performed Overall Cognitive Status: Impaired/Different from baseline Area of Impairment: Attention;Memory;Problem solving                   Current Attention Level: Sustained;Selective Memory: Decreased short-term memory       Problem Solving: Requires verbal cues;Requires tactile cues          Exercises  Shoulder Instructions       General Comments      Pertinent Vitals/ Pain       Pain Assessment: No/denies pain  Home Living                                          Prior Functioning/Environment              Frequency  Min 2X/week        Progress Toward Goals  OT Goals(current goals can now be found in the care plan section)        Plan      Co-evaluation                 AM-PAC OT "6 Clicks" Daily Activity     Outcome Measure   Help from another person eating meals?: None Help from another person taking care of personal grooming?: A Little Help from another person toileting, which includes using toliet, bedpan, or urinal?: A Lot Help  from another person bathing (including washing, rinsing, drying)?: A Lot     6 Click Score: 11    End of Session Equipment Utilized During Treatment: Gait belt;Rolling walker  OT Visit Diagnosis: Unsteadiness on feet (R26.81)   Activity Tolerance Patient limited by fatigue   Patient Left in chair;with family/visitor present   Nurse Communication Mobility status;Other (comment)(asked to verify if chair alarm had been set)        Time: 8466-5993 OT Time Calculation (min): 13 min  Charges: OT General Charges $OT Visit: 1 Visit OT Treatments $Self Care/Home Management : 8-22 mins   Robet Leu, COTA/L 01/30/2018, 11:26 AM

## 2018-01-30 NOTE — Progress Notes (Signed)
Report called to [place], RN, on 6N.  Patient to be transferred to 6N11.  Will notify family of transfer.

## 2018-01-30 NOTE — Progress Notes (Signed)
Patient approaching 48 hours as observation status.  Will notify MD to switch to inpatient.

## 2018-01-30 NOTE — Progress Notes (Signed)
Received patient from 5c via bed. Report given by [facility] RN. Patient alert and oriented, able to verbalize needs. No complaints of pain

## 2018-01-30 NOTE — Progress Notes (Signed)
Pt was trying to pull out IV and wasn't following instructions. Pt refused night meds and one time dose of ativan.

## 2018-01-30 NOTE — Progress Notes (Signed)
Patient becoming increasingly less confused over the duration of the day.  Now has better re-call and is more oriented to person / place.  Continues to have sporadic episodes of confusion, but is no longer irritable and / or agitated.  Will be transferring to 6N.

## 2018-01-30 NOTE — Progress Notes (Signed)
Pt is alert, oriented to self and confused, not following instructions. Pt is agitated NP Craige Cotta was made aware.

## 2018-01-31 DIAGNOSIS — E782 Mixed hyperlipidemia: Secondary | ICD-10-CM

## 2018-01-31 DIAGNOSIS — Z95810 Presence of automatic (implantable) cardiac defibrillator: Secondary | ICD-10-CM

## 2018-01-31 DIAGNOSIS — E118 Type 2 diabetes mellitus with unspecified complications: Secondary | ICD-10-CM

## 2018-01-31 DIAGNOSIS — I10 Essential (primary) hypertension: Secondary | ICD-10-CM

## 2018-01-31 DIAGNOSIS — E1165 Type 2 diabetes mellitus with hyperglycemia: Secondary | ICD-10-CM

## 2018-01-31 DIAGNOSIS — N390 Urinary tract infection, site not specified: Secondary | ICD-10-CM

## 2018-01-31 DIAGNOSIS — I428 Other cardiomyopathies: Secondary | ICD-10-CM

## 2018-01-31 DIAGNOSIS — N10 Acute tubulo-interstitial nephritis: Secondary | ICD-10-CM

## 2018-01-31 DIAGNOSIS — A419 Sepsis, unspecified organism: Secondary | ICD-10-CM

## 2018-01-31 DIAGNOSIS — I5042 Chronic combined systolic (congestive) and diastolic (congestive) heart failure: Secondary | ICD-10-CM

## 2018-01-31 DIAGNOSIS — Z794 Long term (current) use of insulin: Secondary | ICD-10-CM

## 2018-01-31 LAB — BLOOD CULTURE ID PANEL (REFLEXED)
Acinetobacter baumannii: NOT DETECTED
CANDIDA PARAPSILOSIS: NOT DETECTED
Candida albicans: NOT DETECTED
Candida glabrata: NOT DETECTED
Candida krusei: NOT DETECTED
Candida tropicalis: NOT DETECTED
Enterobacter cloacae complex: NOT DETECTED
Enterobacteriaceae species: NOT DETECTED
Enterococcus species: DETECTED — AB
Escherichia coli: NOT DETECTED
HAEMOPHILUS INFLUENZAE: NOT DETECTED
KLEBSIELLA OXYTOCA: NOT DETECTED
Klebsiella pneumoniae: NOT DETECTED
Listeria monocytogenes: NOT DETECTED
Neisseria meningitidis: NOT DETECTED
Proteus species: NOT DETECTED
Pseudomonas aeruginosa: NOT DETECTED
Serratia marcescens: NOT DETECTED
Staphylococcus aureus (BCID): NOT DETECTED
Staphylococcus species: NOT DETECTED
Streptococcus agalactiae: NOT DETECTED
Streptococcus pneumoniae: NOT DETECTED
Streptococcus pyogenes: NOT DETECTED
Streptococcus species: NOT DETECTED
Vancomycin resistance: NOT DETECTED

## 2018-01-31 LAB — CBC
HCT: 36.3 % (ref 36.0–46.0)
Hemoglobin: 11.5 g/dL — ABNORMAL LOW (ref 12.0–15.0)
MCH: 24.5 pg — ABNORMAL LOW (ref 26.0–34.0)
MCHC: 31.7 g/dL (ref 30.0–36.0)
MCV: 77.2 fL — ABNORMAL LOW (ref 80.0–100.0)
NRBC: 0 % (ref 0.0–0.2)
PLATELETS: 310 10*3/uL (ref 150–400)
RBC: 4.7 MIL/uL (ref 3.87–5.11)
RDW: 14 % (ref 11.5–15.5)
WBC: 14.3 10*3/uL — ABNORMAL HIGH (ref 4.0–10.5)

## 2018-01-31 LAB — URINE CULTURE: Culture: 60000 — AB

## 2018-01-31 LAB — GLUCOSE, CAPILLARY: Glucose-Capillary: 185 mg/dL — ABNORMAL HIGH (ref 70–99)

## 2018-01-31 MED ORDER — HYDROCODONE-ACETAMINOPHEN 5-325 MG PO TABS: 1.0000 | ORAL_TABLET | Freq: Four times a day (QID) | ORAL | Status: DC | PRN

## 2018-01-31 MED ORDER — AMOXICILLIN 500 MG PO CAPS
500.0000 mg | ORAL_CAPSULE | Freq: Two times a day (BID) | ORAL | Status: DC
  Filled 2018-01-31 (×2): qty 1

## 2018-01-31 MED ORDER — AMOXICILLIN 500 MG PO CAPS: 500.0000 mg | ORAL_CAPSULE | Freq: Two times a day (BID) | ORAL | 0 refills | Status: DC

## 2018-01-31 MED ORDER — ENSURE ENLIVE PO LIQD: 237.0000 mL | Freq: Two times a day (BID) | ORAL | 12 refills | Status: DC

## 2018-01-31 NOTE — Plan of Care (Signed)
  Problem: Coping: Goal: Level of anxiety will decrease Outcome: Progressing   Problem: Elimination: Goal: Will not experience complications related to urinary retention Outcome: Progressing   Problem: Pain Managment: Goal: General experience of comfort will improve Outcome: Progressing   

## 2018-01-31 NOTE — Care Management Note (Signed)
Case Management Note  Patient Details  Name: Diana Campbell MRN: [id number] Date of Birth: 05/24/1945  Subjective/Objective:                    Action/Plan:  Discussed discharge planning at bedside with patient, son and grand daughter.   Patient has walker at home, needs 3 in 1 . Called Fayrene Fearing with [facility] for same. Elmon Kirschner with [facility] accepted home health referral. Expected Discharge Date:  01/31/18               Expected Discharge Plan:  Home w Home Health Services  In-House Referral:     Discharge planning Services  CM Consult  Post Acute Care Choice:    Choice offered to:  Patient  DME Arranged:  3-N-1 DME Agency:  [facility]  HH Arranged:  PT, OT [facility] Agency:  [facility]  Status of Service:  Completed, signed off  If discussed at Long Length of Stay Meetings, dates discussed:    Additional Comments:  Kingsley Plan, RN 01/31/2018, 10:15 AM

## 2018-01-31 NOTE — Progress Notes (Signed)
Infectious Diseases Pharmacy Note  Given new finding of Enterococcus bacteremia, recommend increasing dose of amoxicillin from BID to TID in addition to lengthening duration of treatment. Discussed with ID Dr. Orvan Falconer and discharging hospitalist Dr. Catha Gosselin. Called [facility] pharmacy to adjust prescription.  New prescription: Amoxicillin 500mg  PO TID x14 days (#42)  Florene Brill N. Zigmund Daniel, PharmD, BCPS PGY2 Infectious Diseases Pharmacy Resident Phone: [phone]

## 2018-01-31 NOTE — Social Work (Signed)
Acknowledging SNF recommendations, per MD note pt refusing placement at this time.  CSW signing off. Please consult if any additional needs arise.  Doy Hutching, LCSWA [facility] Clinical Social Work [phone]

## 2018-01-31 NOTE — Discharge Summary (Signed)
Physician Discharge Summary  Diana Campbell:[id number] DOB: 07-21-1945 DOA: 01/28/2018  PCP: Alycia Rossetti, MD  Admit date: 01/28/2018 Discharge date: 01/31/2018  Time spent: 45 minutes  Recommendations for Outpatient Follow-up:  Patient will be discharged to home with home health physical and occupational therapy.  Patient will need to follow up with primary care provider within one week of discharge.  Patient should continue medications as prescribed.  Patient should follow a heart healthy/carb modified diet.   Discharge Diagnoses:  Principal diagnosis -Sepsis secondary to UTI Acute delirium Back pain with history of spinal stenosis Essential hypertension Chronic combined systolic and diastolic heart failure Diabetes mellitus, type II Obesity  Discharge Condition: Stable  Diet recommendation: Heart healthy/carb modified  Filed Weights   01/28/18 1752 01/30/18 0500 01/31/18 0500  Weight: 90.3 kg 89.5 kg 89.7 kg    History of present illness:  On 01/28/2018 by Dr. Vance Gather  Diana Campbell is a 73 y.o. female with a history of combined CHF, IDT2DM, symptomatic bradycardia and VF arrest s/p ICD, HTN, HLD who presented to the ED with generalized weakness. She noted gradual onset of mild-moderate weakness in the legs which progressed to generalized weakness while doing laundry yesterday. Had worsening back pain overnight causing her to lose sleep and when she tried to get out of bed today, required assistance by her husband and granddaughter. She reports chronic low back/left hip and leg pain since a fall in Sept 2019 which has been getting somewhat better, but this pain is worsening in the bilateral back diffusely, nonradiating, constant, worse with movement or palpation, better with tylenol and medications given in ED. She reports 1 week of pain with urination which she attributed to drinking crush sodas and decreased appetite without abdominal pain, nausea, vomiting, or  early satiety. She did not take any medications today and has not eaten since yesterday.  Hospital Course:  Sepsis secondary to UTI -Patient presented with leukocytosis as well as fever and tachypnea (improving) -Had 1 week of worsening dysuria and back pain -UA positive for pyuria and bacteriuria -Patient received ceftriaxone for 3 days -Urine culture 60K colonies Enterococcus Faecalis (sensitive to ampicillin, Levaquin, nitrofurantoin and vancomycin) -Blood cultures show no growth to date -Placed on IV fluids -Will discharge patient on amoxicillin 500 mg twice daily for 7 days  Acute delirium -Likely secondary to the above, patient currently alert and oriented x4  Back pain with history of spinal stenosis -Patient reported having back pain with radiation down both legs -CT imaging showing multifactorial spinal stenosis, no signs of an acute abscess -Patient was taking tramadol at home with minimal relief, oxycodone was given however it seems to have made her "loopy" as per family -PT and OT recommended SNF, however patient is refusing -Case management consulted for home health  Essential hypertension -Continue Coreg  Chronic combined systolic and diastolic heart failure -Echocardiogram March 2019 showed an EF of 40 to 00%, grade 1 diastolic dysfunction -Currently appears to be euvolemic and compensated -Continue home medications on discharge  Diabetes mellitus, type II -Last hemoglobin A1c was 8.3 -Patient follows up with Dr. Concha Se -Continue home medications at discharge  Obesity -BMI 30.9 -Follow-up with PCP to discuss lifestyle modifications  Procedures: None  Consultations: None  Discharge Exam: Vitals:   01/30/18 2100 01/31/18 0440  BP: (!) 165/66 (!) 165/67  Pulse: 68 84  Resp: 16 17  Temp: 99 F (37.2 C)   SpO2: 99% 99%   Patient feeling better and would  like to go home.  Denies current chest pain, shortness of breath, abdominal pain, nausea or  vomiting, diarrhea or constipation, dizziness or headache.  States she is able to urinate without pain.   General: Well developed, well nourished, NAD, appears stated age  HEENT: NCAT, mucous membranes moist.  Neck: Supple  Cardiovascular: S1 S2 auscultated, no rubs, murmurs or gallops. Regular rate and rhythm.  Respiratory: Clear to auscultation bilaterally with equal chest rise  Abdomen: Soft, nontender, nondistended, + bowel sounds  Extremities: warm dry without cyanosis clubbing or edema  Neuro: AAOx3, nonfocal  Psych: Pleasant, appropriate mood and affect  Discharge Instructions Discharge Instructions    Discharge instructions   Complete by:  As directed    Patient will be discharged to home with home health physical and occupational therapy.  Patient will need to follow up with primary care provider within one week of discharge.  Patient should continue medications as prescribed.  Patient should follow a heart healthy/carb modified diet.     Allergies as of 01/31/2018   No Known Allergies     Medication List    STOP taking these medications   hydrochlorothiazide 12.5 MG capsule Commonly known as:  MICROZIDE     TAKE these medications   ACCU-CHEK AVIVA PLUS test strip Generic drug:  glucose blood USE TO TEST BLOOD SUGAR 4 TIMES A DAY (DX: E11.65)   ACCU-CHEK FASTCLIX LANCET Kit 1 each by Does not apply route 2 (two) times daily.   ACCU-CHEK FASTCLIX LANCETS Misc 1 each by Does not apply route 2 (two) times daily.   acetaminophen 325 MG tablet Commonly known as:  TYLENOL Take 650 mg by mouth 3 (three) times daily.   amoxicillin 500 MG capsule Commonly known as:  AMOXIL Take 1 capsule (500 mg total) by mouth every 12 (twelve) hours.   aspirin 325 MG EC tablet Take 81 mg by mouth daily.   atorvastatin 10 MG tablet Commonly known as:  LIPITOR Take 10 mg by mouth at bedtime.   carvedilol 12.5 MG tablet Commonly known as:  COREG TAKE 1/2 TABLET BY  MOUTH TWICE A DAY WITH A MEAL What changed:  See the new instructions.   feeding supplement (ENSURE ENLIVE) Liqd Take 237 mLs by mouth 2 (two) times daily between meals.   HUMALOG MIX 75/25 KWIKPEN [place] Inject 30-35 Units into the skin 2 (two) times daily before a meal. What changed:  Another medication with the same name was removed. Continue taking this medication, and follow the directions you see here.   lisinopril 5 MG tablet Commonly known as:  PRINIVIL,ZESTRIL Take 2.5-5 mg by mouth See admin instructions. Take 5 mg by mouth in the morning and 2.5 at 4pm What changed:  Another medication with the same name was removed. Continue taking this medication, and follow the directions you see here.   traMADol 50 MG tablet Commonly known as:  ULTRAM Take 50 mg by mouth every 6 (six) hours as needed.   VITAMIN D PO Take 1 tablet by mouth daily.      No Known Allergies Follow-up Information    [place], Modena Nunnery, MD. Schedule an appointment as soon as possible for a visit in 1 week(s).   Specialty:  Family Medicine Why:  Hospital follow up Contact information: [place] [address] [phone]            The results of significant diagnostics from this hospitalization (including imaging, microbiology, ancillary and laboratory) are  listed below for reference.    Significant Diagnostic Studies: Dg Chest 2 View  Result Date: 01/28/2018 CLINICAL DATA:  Patient with history of fever EXAM: CHEST - 2 VIEW COMPARISON:  Chest radiograph 03/23/2011 FINDINGS: Multi lead AICD device overlies the left hemithorax. Cardiomegaly. Low lung volumes. Bibasilar atelectasis. No pleural effusion or pneumothorax. Thoracic spine degenerative changes. IMPRESSION: Low lung volumes with basilar atelectasis. Cardiomegaly. Electronically Signed   By: Lovey Newcomer M.D.   On: 01/28/2018 11:27   Ct Thoracic Spine W Contrast  Result Date: 01/28/2018 CLINICAL DATA:  Back and leg pain x2  days, no known trauma, rule out epidural abscess EXAM: CT THORACIC SPINE WITH CONTRAST; CT LUMBAR SPINE WITH CONTRAST TECHNIQUE: Multidetector CT images of thoracic and lumbar spine obtained according to the standard protocol following intravenous contrast administration. CONTRAST:  194m ISOVUE-370 IOPAMIDOL (ISOVUE-370) INJECTION 76% COMPARISON:  03/23/2011 FINDINGS: Alignment: Normal through the thoracic spine. Early grade 1 anterolisthesis L4-5. Vertebrae: 12 rib-bearing thoracic and 5 non rib-bearing lumbar segments. Negative for fracture. No pars defect. No focal bone lesion. Paraspinal and other soft tissues: No epidural hematoma. No peripherally enhancing epidural collection to suggest abscess. No paraspinal phlegmon. Left subclavian transvenous pacing leads partially visualized. Coronary, thoracic and abdominal aortic calcified plaque. 1.6 cm probable cyst, lower pole left kidney. Disc levels: C5-6: Disc narrowing with endplate spurring , probable central protrusion T1-2: Anterior and posterior endplate spurs. T2-L1: Bridging osteophytes across the interspaces with solid-appearing fusion L1-2: Large anterior endplate osteophytes. L2-4: Circumferential disc bulges. Bilateral facet DJD and thickening of the ligamentum flavum contributing to central canal stenosis of at least mild severity at both levels. L4-5: Vacuum phenomenon in the interspace. Broad posterior disc bulge. Advanced bilateral facet DJD with some thickening of ligamentum flavum contributing to at least moderate central canal stenosis and left foraminal stenosis. L5-S1: Moderate bilateral facet DJD. No definite disc protrusion or spinal stenosis. IMPRESSION: 1. Negative for fracture, enhancing epidural abscess, canal hematoma, or other acute abnormality. 2. Multifactorial spinal stenosis of at least mild severity L2-3, L3-4, moderate L4-5. 3. Left foraminal stenosis L4-5 4. Advanced facet DJD L4-5 likely accounts for the mild grade 1  anterolisthesis at this level. 5. Coronary and Aortic Atherosclerosis (ICD10-170.0). Electronically Signed   By: DLucrezia EuropeM.D.   On: 01/28/2018 15:51   Ct Lumbar Spine W Contrast  Result Date: 01/28/2018 CLINICAL DATA:  Back and leg pain x2 days, no known trauma, rule out epidural abscess EXAM: CT THORACIC SPINE WITH CONTRAST; CT LUMBAR SPINE WITH CONTRAST TECHNIQUE: Multidetector CT images of thoracic and lumbar spine obtained according to the standard protocol following intravenous contrast administration. CONTRAST:  1085mISOVUE-370 IOPAMIDOL (ISOVUE-370) INJECTION 76% COMPARISON:  03/23/2011 FINDINGS: Alignment: Normal through the thoracic spine. Early grade 1 anterolisthesis L4-5. Vertebrae: 12 rib-bearing thoracic and 5 non rib-bearing lumbar segments. Negative for fracture. No pars defect. No focal bone lesion. Paraspinal and other soft tissues: No epidural hematoma. No peripherally enhancing epidural collection to suggest abscess. No paraspinal phlegmon. Left subclavian transvenous pacing leads partially visualized. Coronary, thoracic and abdominal aortic calcified plaque. 1.6 cm probable cyst, lower pole left kidney. Disc levels: C5-6: Disc narrowing with endplate spurring , probable central protrusion T1-2: Anterior and posterior endplate spurs. T2-L1: Bridging osteophytes across the interspaces with solid-appearing fusion L1-2: Large anterior endplate osteophytes. L2-4: Circumferential disc bulges. Bilateral facet DJD and thickening of the ligamentum flavum contributing to central canal stenosis of at least mild severity at both levels. L4-5: Vacuum phenomenon in the interspace.  Broad posterior disc bulge. Advanced bilateral facet DJD with some thickening of ligamentum flavum contributing to at least moderate central canal stenosis and left foraminal stenosis. L5-S1: Moderate bilateral facet DJD. No definite disc protrusion or spinal stenosis. IMPRESSION: 1. Negative for fracture, enhancing epidural  abscess, canal hematoma, or other acute abnormality. 2. Multifactorial spinal stenosis of at least mild severity L2-3, L3-4, moderate L4-5. 3. Left foraminal stenosis L4-5 4. Advanced facet DJD L4-5 likely accounts for the mild grade 1 anterolisthesis at this level. 5. Coronary and Aortic Atherosclerosis (ICD10-170.0). Electronically Signed   By: Lucrezia Europe M.D.   On: 01/28/2018 15:51    Microbiology: Recent Results (from the past 240 hour(s))  Culture, blood (routine x 2)     Status: None (Preliminary result)   Collection Time: 01/28/18  6:15 PM  Result Value Ref Range Status   Specimen Description BLOOD RIGHT ANTECUBITAL  Final   Special Requests   Final    BOTTLES DRAWN AEROBIC ONLY Blood Culture adequate volume   Culture   Final    NO GROWTH 3 DAYS Performed at [facility], [address], [place], [place] [address]    Report Status PENDING  Incomplete  Culture, blood (routine x 2)     Status: None (Preliminary result)   Collection Time: 01/28/18  6:15 PM  Result Value Ref Range Status   Specimen Description BLOOD BLOOD RIGHT FOREARM  Final   Special Requests   Final    BOTTLES DRAWN AEROBIC ONLY Blood Culture adequate volume   Culture   Final    NO GROWTH 3 DAYS Performed at  [facility], [place] [address] [place], [place] [address]    Report Status PENDING  Incomplete  Culture, Urine     Status: Abnormal   Collection Time: 01/28/18 11:50 PM  Result Value Ref Range Status   Specimen Description URINE, RANDOM  Final   Special Requests   Final    NONE Performed at [facility], [place] [address] [place], [place] [address]    Culture 60,000 COLONIES/mL ENTEROCOCCUS FAECALIS (A)  Final   Report Status 01/31/2018 FINAL  Final   Organism ID, Bacteria ENTEROCOCCUS FAECALIS (A)  Final      Susceptibility   Enterococcus faecalis - MIC*    AMPICILLIN <=2 SENSITIVE Sensitive     LEVOFLOXACIN 2 SENSITIVE Sensitive     NITROFURANTOIN <=16 SENSITIVE Sensitive      VANCOMYCIN 1 SENSITIVE Sensitive     * 60,000 COLONIES/mL ENTEROCOCCUS FAECALIS     Labs: Basic Metabolic Panel: Recent Labs  Lab 01/28/18 1146 01/29/18 0552 01/30/18 1050  NA 135 137 136  K 4.6 3.8 3.6  CL 102 104 102  CO2 21* 22 24  GLUCOSE 240* 148* 215*  BUN _0 CREATININE 1.13* 0.99 0.86  CALCIUM 9.9 9.5 9.6   Liver Function Tests: Recent Labs  Lab 01/28/18 1146  AST 20  ALT 15  ALKPHOS 64  BILITOT 1.3*  PROT 8.0  ALBUMIN 4.0   No results for input(s): LIPASE, AMYLASE in the last 168 hours. No results for input(s): AMMONIA in the last 168 hours. CBC: Recent Labs  Lab 01/28/18 1146 01/29/18 0552 01/30/18 1050 01/31/18 0653  WBC 19.9* 18.1* 16.4* 14.3*  NEUTROABS  --   --  11.1*  --   HGB 12.0 10.9* 11.2* 11.5*  HCT 40.0 35.6* 36.2 36.3  MCV 78.7* 77.6* 77.4* 77.2*  PLT 262 231 255 310  Cardiac Enzymes: No results for input(s): CKTOTAL, CKMB, CKMBINDEX, TROPONINI in the last 168 hours. BNP: BNP (last 3 results) No results for input(s): BNP in the last 8760 hours.  ProBNP (last 3 results) No results for input(s): PROBNP in the last 8760 hours.  CBG: Recent Labs  Lab 01/30/18 0758 01/30/18 1117 01/30/18 1520 01/30/18 2101 01/31/18 0810  GLUCAP 193* 197* 217* 164* 185*       Signed:  Maryann Mikhail  [facility] 01/31/2018, 9:25 AM

## 2018-01-31 NOTE — Progress Notes (Signed)
PHARMACY - PHYSICIAN COMMUNICATION CRITICAL VALUE ALERT - BLOOD CULTURE IDENTIFICATION (BCID)  Diana Campbell is an 73 y.o. female who presented to [facility] 01/28/2018 with a chief complaint of weakness/UTI.  Assessment:  Enterococcus bactermia (1/2 bottles), enterococcus UTI  Name of physician (or Provider) Contacted: ID team  Current antibiotics: Amoxicillin   Changes to prescribed antibiotics recommended:   Patient was discharged shortly after BCID notification. ID team aware and making medical decisions, planning to at least increase dose of amoxicillin.  Results for orders placed or performed during the hospital encounter of 01/28/18  Blood Culture ID Panel (Reflexed) (Collected: 01/28/2018  6:15 PM)  Result Value Ref Range   Enterococcus species DETECTED (A) NOT DETECTED   Vancomycin resistance NOT DETECTED NOT DETECTED   Listeria monocytogenes NOT DETECTED NOT DETECTED   Staphylococcus species NOT DETECTED NOT DETECTED   Staphylococcus aureus (BCID) NOT DETECTED NOT DETECTED   Streptococcus species NOT DETECTED NOT DETECTED   Streptococcus agalactiae NOT DETECTED NOT DETECTED   Streptococcus pneumoniae NOT DETECTED NOT DETECTED   Streptococcus pyogenes NOT DETECTED NOT DETECTED   Acinetobacter baumannii NOT DETECTED NOT DETECTED   Enterobacteriaceae species NOT DETECTED NOT DETECTED   Enterobacter cloacae complex NOT DETECTED NOT DETECTED   Escherichia coli NOT DETECTED NOT DETECTED   Klebsiella oxytoca NOT DETECTED NOT DETECTED   Klebsiella pneumoniae NOT DETECTED NOT DETECTED   Proteus species NOT DETECTED NOT DETECTED   Serratia marcescens NOT DETECTED NOT DETECTED   Haemophilus influenzae NOT DETECTED NOT DETECTED   Neisseria meningitidis NOT DETECTED NOT DETECTED   Pseudomonas aeruginosa NOT DETECTED NOT DETECTED   Candida albicans NOT DETECTED NOT DETECTED   Candida glabrata NOT DETECTED NOT DETECTED   Candida krusei NOT DETECTED NOT DETECTED   Candida  parapsilosis NOT DETECTED NOT DETECTED   Candida tropicalis NOT DETECTED NOT DETECTED    [facility] Z Nikodem Leadbetter 01/31/2018  11:56 AM

## 2018-01-31 NOTE — Progress Notes (Signed)
Progress Note    Diana Campbell  EAV:[id number] DOB: [birth year]  DOA: 01/28/2018 PCP: Salley Scarlet, MD    Brief Narrative:   Chief complaint: Weakness and back pain  Medical records reviewed and are as summarized below:  Diana Campbell is an 73 y.o. female with past medical history significan tcombined CHF, IDT2DM, symptomatic bradycardia and VF arrest s/p ICD, HTN, and HLD; who presented with complaints of generalized malaise, weakness, and back pain.  Assessment/Plan:   Principal Problem:   Acute pyelonephritis Active Problems:   Cardiomyopathy, nonischemic (HCC)   Uncontrolled type 2 diabetes mellitus with complication, with long-term current use of insulin (HCC)   Automatic implantable cardioverter-defibrillator in situ   Mixed hyperlipidemia   Essential hypertension, benign   Chronic combined systolic and diastolic CHF (congestive heart failure) (HCC)   Sepsis due to urinary tract infection (HCC)   UTI (urinary tract infection)   Sepsis secondary to suspected UTI: Acute.  Patient presented with white blood cell count of 19k, febrile up to 100.5 F, and tachypneic.  Patient reported having 1 week of dysuria and worsening back pain.  Physical exam had revealed bilateral tenderness.  UA positive for pyuria and bacteriuria. Patient has been been on rocephin x 3days. - Follow-up urine and blood cultures - Gentle IV fluids - dicontinue abx culture grew only 60K Enterococcus - Recheck CBC in a.m.  Acute delirium: Overnight patient noted to be aggressive and altered.  Family notes that she question being at home.  Suspect a hospital-acquired delirium or aspect of sundowning.  Back pain, history of spinal stenosis: Patient reports having back pain with radiation down both legs.  CT imaging showing multifactorial spinal stenosis, but no signs of an abscess.  At home patient previously taking tramadol with only minimal relief.  Oxycodone initially given, but patient noted  to be "loopy" per family.  Patient still not letting without assistance.  Declines rehab. - Decrease frequency of hydrocodone - Continue physical therapy/OT  Essential hypertension: Stable - Continue Coreg  Chronic combined systolic and diastolic CHF: Stable.  Patient appears to be euvolemic at this time.  Last echocardiogram showing EF of 40 to 45% in March 2019 with grade 1 diastolic dysfunction and normal RV function. - Continue to monitor intake and output - Continue aspirin, statin, Coreg  Diabetes mellitus type 2: Last hemoglobin A1c noted to be 8.3.  Patient followed by Dr.Gebreselassie in the outpatient setting. - Substitution for Lantus - CBGs q. before meals and at bedtime with     Body mass index is 30.9 kg/m.   Family Communication/Anticipated D/C date and plan/Code Status   DVT prophylaxis: Lovenox ordered. Code Status: Full Code.  Family Communication: Discussed plan of care with patient and family present at bedside Disposition Plan: Likely discharge home in a.m.   Medical Consultants:    None.   Anti-Infectives:    Rocephin day 3  Subjective:   Patient reports that she is in hospital.  Notes pain in the lower back with lying to stand up.  Objective:    Vitals:   01/30/18 1300 01/30/18 1703 01/30/18 2100 01/31/18 0440  BP: 128/62 (!) 138/49 (!) 165/66 (!) 165/67  Pulse: 77 70 68 84  Resp: 16 18 16 17   Temp: 98 F (36.7 C) 98.7 F (37.1 C) 99 F (37.2 C)   TempSrc: Oral Oral Oral   SpO2: 98% 100% 99% 99%  Weight:      Height:  Intake/Output Summary (Last 24 hours) at 01/31/2018 0613 Last data filed at 01/31/2018 0500 Gross per 24 hour  Intake 960 ml  Output 550 ml  Net 410 ml   Filed Weights   01/28/18 1456 01/28/18 1752 01/30/18 0500  Weight: 92 kg 90.3 kg 89.5 kg    Exam: Constitutional: Elderly female currently in NAD, calm, comfortable Eyes: PERRL, lids and conjunctivae normal ENMT: Mucous membranes are moist.  Posterior pharynx clear of any exudate or lesions.  Neck: normal, supple, no masses, no thyromegaly Respiratory: clear to auscultation bilaterally, no wheezing, no crackles. Normal respiratory effort. No accessory muscle use.  Cardiovascular: Regular rate and rhythm, no murmurs / rubs / gallops. No extremity edema. 2+ pedal pulses. No carotid bruits.  Abdomen: no tenderness, no masses palpated. No hepatosplenomegaly. Bowel sounds positive.  Musculoskeletal: no clubbing / cyanosis.  Tenderness noted of the lumbar spine.  Moderate assistance to try and stand. Skin: no rashes, lesions, ulcers. No induration Neurologic: CN 2-12 grossly intact. Sensation intact, DTR normal. Strength 5/5 in all 4.  Psychiatric: Normal judgment and insight. Alert and repeatedly telling herself that she is in the hospital upon entering the room.  .    Data Reviewed:   I have personally reviewed following labs and imaging studies:  Labs: Labs show the following:   Basic Metabolic Panel: Recent Labs  Lab 01/28/18 1146 01/29/18 0552 01/30/18 1050  NA 135 137 136  K 4.6 3.8 3.6  CL 102 104 102  CO2 21* 22 24  GLUCOSE 240* 148* 215*  BUN 17 12 11   CREATININE 1.13* 0.99 0.86  CALCIUM 9.9 9.5 9.6   GFR Estimated Creatinine Clearance: 66 mL/min (by C-G formula based on SCr of 0.86 mg/dL). Liver Function Tests: Recent Labs  Lab 01/28/18 1146  AST 20  ALT 15  ALKPHOS 64  BILITOT 1.3*  PROT 8.0  ALBUMIN 4.0   No results for input(s): LIPASE, AMYLASE in the last 168 hours. No results for input(s): AMMONIA in the last 168 hours. Coagulation profile No results for input(s): INR, PROTIME in the last 168 hours.  CBC: Recent Labs  Lab 01/28/18 1146 01/29/18 0552 01/30/18 1050  WBC 19.9* 18.1* 16.4*  NEUTROABS  --   --  11.1*  HGB 12.0 10.9* 11.2*  HCT 40.0 35.6* 36.2  MCV 78.7* 77.6* 77.4*  PLT 262 231 255   Cardiac Enzymes: No results for input(s): CKTOTAL, CKMB, CKMBINDEX, TROPONINI in the  last 168 hours. BNP (last 3 results) No results for input(s): PROBNP in the last 8760 hours. CBG: Recent Labs  Lab 01/30/18 0619 01/30/18 0758 01/30/18 1117 01/30/18 1520 01/30/18 2101  GLUCAP 208* 193* 197* 217* 164*   D-Dimer: No results for input(s): DDIMER in the last 72 hours. Hgb A1c: No results for input(s): HGBA1C in the last 72 hours. Lipid Profile: No results for input(s): CHOL, HDL, LDLCALC, TRIG, CHOLHDL, LDLDIRECT in the last 72 hours. Thyroid function studies: Recent Labs    01/29/18 0552  TSH 1.683   Anemia work up: No results for input(s): VITAMINB12, FOLATE, FERRITIN, TIBC, IRON, RETICCTPCT in the last 72 hours. Sepsis Labs: Recent Labs  Lab 01/28/18 1146 01/28/18 1156 01/29/18 0552 01/30/18 1050  WBC 19.9*  --  18.1* 16.4*  LATICACIDVEN  --  1.38  --   --     Microbiology Recent Results (from the past 240 hour(s))  Culture, blood (routine x 2)     Status: None (Preliminary result)   Collection Time: 01/28/18  6:15 PM  Result Value Ref Range Status   Specimen Description BLOOD RIGHT ANTECUBITAL  Final   Special Requests   Final    BOTTLES DRAWN AEROBIC ONLY Blood Culture adequate volume   Culture   Final    NO GROWTH 2 DAYS Performed at [facility], [address] [place], [place] [address]    Report Status PENDING  Incomplete  Culture, blood (routine x 2)     Status: None (Preliminary result)   Collection Time: 01/28/18  6:15 PM  Result Value Ref Range Status   Specimen Description BLOOD BLOOD RIGHT FOREARM  Final   Special Requests   Final    BOTTLES DRAWN AEROBIC ONLY Blood Culture adequate volume   Culture   Final    NO GROWTH 2 DAYS Performed at [facility], [address] [place], [place] [address]    Report Status PENDING  Incomplete  Culture, Urine     Status: Abnormal (Preliminary result)   Collection Time: 01/28/18 11:50 PM  Result Value Ref Range Status   Specimen Description URINE, RANDOM  Final   Special  Requests   Final    NONE Performed at [facility], [address], [place], [place] [address]    Culture 60,000 COLONIES/mL ENTEROCOCCUS FAECALIS (A)  Final   Report Status PENDING  Incomplete    Procedures and diagnostic studies:  No results found.  Medications:   . acetaminophen  650 mg Oral TID  . aspirin  81 mg Oral Daily  . atorvastatin  10 mg Oral QHS  . carvedilol  6.25 mg Oral BID  . enoxaparin (LOVENOX) injection  40 mg Subcutaneous Q24H  . feeding supplement (ENSURE ENLIVE)  237 mL Oral BID BM  . insulin aspart  0-5 Units Subcutaneous QHS  . insulin aspart  0-9 Units Subcutaneous TID WC  . insulin glargine  20 Units Subcutaneous QHS  . LORazepam  0.5 mg Oral Once   Continuous Infusions: . cefTRIAXone (ROCEPHIN)  IV 2 g (01/30/18 1342)     LOS: 1 day   Keishawn Rajewski A Shamonica Schadt  [facility]   *Please refer to [email or web address], password [email or web address] to get updated schedule on who will round on this patient, as hospitalists switch teams weekly. If 7PM-7AM, please contact night-coverage at [email or web address], password [email or web address] for any overnight needs.

## 2018-01-31 NOTE — Progress Notes (Signed)
Received a call that patient's blood cultures returned after patient was discharged showing 1/2 enterococcus.   Discussed with Dr. Orvan Falconer, ID.  Patient has been admitted and been receiving for ceftriaxone for 3 days.  Her symptoms as well as leukocytosis and fever did improve on the incorrect treatment.  Doubt that her bacteremia is causing enterococcus given that she has continued to be afebrile and stable.  Discussed with Dr. Orvan Falconer, will increase patient's Augmentin length to 2 weeks.  I placed a call to patient's PCP to have repeat cultures done in 21 days.  Called patient's PCP's office, [facility]. Have asked for them to obtain repeat cultures in 21 days.    Luvia Orzechowski D.O. [facility]  If 7PM-7AM, please contact night-coverage [email or web address] 01/31/2018, 1:16 PM

## 2018-01-31 NOTE — Progress Notes (Signed)
Diana Campbell to be D/C'd  per MD order. Discussed with the patient and all questions fully answered.  VSS, Skin clean, dry and intact without evidence of skin break down, no evidence of skin tears noted.  IV catheter discontinued intact. Site without signs and symptoms of complications. Dressing and pressure applied.  An After Visit Summary was printed and given to the patient. Patient received prescription.  D/c education completed with patient/family including follow up instructions, medication list, d/c activities limitations if indicated, with other d/c instructions as indicated by MD - patient able to verbalize understanding, all questions fully answered.   Patient instructed to return to ED, call 911, or call MD for any changes in condition.   Patient to be escorted via WC, and D/C home via private auto.

## 2018-02-02 LAB — CULTURE, BLOOD (ROUTINE X 2)
Culture: NO GROWTH
Special Requests: ADEQUATE
Special Requests: ADEQUATE

## 2018-02-09 ENCOUNTER — Encounter: Payer: Self-pay | Admitting: Cardiovascular Disease

## 2018-02-09 ENCOUNTER — Ambulatory Visit (INDEPENDENT_AMBULATORY_CARE_PROVIDER_SITE_OTHER): Payer: Medicare Other | Admitting: Cardiovascular Disease

## 2018-02-09 VITALS — BP 120/63 | HR 66 | Ht 66.0 in | Wt 199.4 lb

## 2018-02-09 DIAGNOSIS — E1121 Type 2 diabetes mellitus with diabetic nephropathy: Secondary | ICD-10-CM | POA: Diagnosis not present

## 2018-02-09 DIAGNOSIS — I10 Essential (primary) hypertension: Secondary | ICD-10-CM | POA: Diagnosis not present

## 2018-02-09 DIAGNOSIS — I480 Paroxysmal atrial fibrillation: Secondary | ICD-10-CM | POA: Diagnosis not present

## 2018-02-09 DIAGNOSIS — Z95810 Presence of automatic (implantable) cardiac defibrillator: Secondary | ICD-10-CM | POA: Diagnosis not present

## 2018-02-09 DIAGNOSIS — I428 Other cardiomyopathies: Secondary | ICD-10-CM | POA: Diagnosis not present

## 2018-02-09 DIAGNOSIS — E7800 Pure hypercholesterolemia, unspecified: Secondary | ICD-10-CM

## 2018-02-09 LAB — HEMOGLOBIN A1C
HEMOGLOBIN A1C: 6.7 %{Hb} — AB (ref <5.7)
Mean Plasma Glucose: 146 (calc)
eAG (mmol/L): 8.1 (calc)

## 2018-02-09 LAB — COMPLETE METABOLIC PANEL WITH GFR
AG RATIO: 1 (calc) (ref 1.0–2.5)
ALT: 18 U/L (ref 6–29)
AST: 15 U/L (ref 10–35)
Albumin: 3.5 g/dL — ABNORMAL LOW (ref 3.6–5.1)
Alkaline phosphatase (APISO): 61 U/L (ref 33–130)
BILIRUBIN TOTAL: 0.3 mg/dL (ref 0.2–1.2)
BUN: 13 mg/dL (ref 7–25)
CHLORIDE: 106 mmol/L (ref 98–110)
CO2: 29 mmol/L (ref 20–32)
Calcium: 9.7 mg/dL (ref 8.6–10.4)
Creat: 0.77 mg/dL (ref 0.60–0.93)
GFR, EST AFRICAN AMERICAN: 88 mL/min/{1.73_m2} (ref >=60)
GFR, Est Non African American: 76 mL/min/{1.73_m2} (ref >=60)
Globulin: 3.4 g/dL (calc) (ref 1.9–3.7)
Glucose, Bld: 106 mg/dL — ABNORMAL HIGH (ref 65–99)
POTASSIUM: 4.1 mmol/L (ref 3.5–5.3)
Sodium: 144 mmol/L (ref 135–146)
Total Protein: 6.9 g/dL (ref 6.1–8.1)

## 2018-02-09 MED ORDER — LISINOPRIL 2.5 MG PO TABS: ORAL_TABLET | ORAL | 11 refills | Status: DC

## 2018-02-09 NOTE — Patient Instructions (Signed)
Medication Instructions:  Your physician recommends that you continue on your current medications as directed. Please refer to the Current Medication list given to you today.  If you need a refill on your cardiac medications before your next appointment, please call your pharmacy.   Lab work: None ordered If you have labs (blood work) drawn today and your tests are completely normal, you will receive your results only by: . MyChart Message (if you have MyChart) OR . A paper copy in the mail If you have any lab test that is abnormal or we need to change your treatment, we will call you to review the results.  Testing/Procedures: None ordered  Follow-Up: At [facility], you and your health needs are our priority.  As part of our continuing mission to provide you with exceptional heart care, we have created designated Provider Care Teams.  These Care Teams include your primary Cardiologist (physician) and Advanced Practice Providers (APPs -  Physician Assistants and Nurse Practitioners) who all work together to provide you with the care you need, when you need it. You will need a follow up appointment in 12 months.  Please call our office 2 months in advance to schedule this appointment.  You may see Dr.Croitoru or one of the following Advanced Practice Providers on your designated Care Team: Hao Meng, PA-C . Angela Duke, PA-C   

## 2018-02-09 NOTE — Progress Notes (Signed)
Patient ID: Diana Campbell, female   DOB: Oct 23, 1945, 73 y.o.   MRN: [id number]    Cardiology Office Note    Date:  02/11/2018   [name] 28-Oct-1945, MRN [id number]  PCP:  No primary care provider on file.  Cardiologist:  Diana Campbell, M.D. Diana Klein, MD   Chief Complaint  Patient presents with  . Pacemaker Check  . Follow-up    hospitalization    History of Present Illness:  Diana Campbell is a 73 y.o. female a remote history of ventricular fibrillation arrest in June 2005, resuscitated, in the setting of nonischemic cardiomyopathy with left ventricular ejection fraction that has improved from 15% at diagnosis to around 45% by most recent evaluation. She has minor coronary atherosclerosis by previous angiography. She had a  Medtronic defibrillator/Sprint Fidelis lead,subsequently had a complete revision of her system with placement of a new right ventricular lead and dual-chamber generator in July 2012.  Underwent elective generator change out in October 2019.  She was recently hospitalized with a complicated E. faecalis urinary tract infection, setting of severe generalized weakness.  Back pain was a prominent complaint.  At one point there was suggestion that she might need an MRI to look for vertebral infection, but this was evaluated with a CT showing spinal stenosis.  She does have an MRI conditional device but also has abandoned leads.  Unfortunately she cannot have MRI scans.  Is recovering slowly from her infection, not quite back to full functional status yet.    The OptiVol trend during her hospitalization suggest that she became suddenly severely hypovolemic, now slowly returning to baseline.  Does admit that her oral intake was very poor. Her appetite remains poor.   She has not had problems with fever since returning home.  Back pain has improved as well.  She denies orthopnea, PND, leg edema, chest pain, palpitations, defibrillator discharge,  syncope.  Additional problems include obstructive sleep apnea, diabetes mellitus on insulin, hypertension and hyperlipidemia.  Most recent hemoglobin A1c was 8.3%.  Normal device function.  The surgical site is well-healed.  Estimated generator longevity 10 years.  She has 70% atrial pacing axis without any need for ventricular pacing (but native AV conduction with left bundle branch block)  Past Medical History:  Diagnosis Date  . Arthritis   . Automatic implantable cardioverter-defibrillator in situ 07/2010  . Cardiac abnormality    with defibrilator  . Chronic combined systolic and diastolic CHF (congestive heart failure) ([place]) 09/04/2015  . Diabetes mellitus   . H/O cardiac arrest 2006  . Hypercholesteremia   . Hypertension   . Neuropathy     Past Surgical History:  Procedure Laterality Date  . 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  Marland Kitchen CARDIAC CATHETERIZATION  04/14/2000   Normal coronary arteries.  Marland Kitchen CARDIAC DEFIBRILLATOR PLACEMENT  07/21/2010   Medtronic Protecta XT DR, model#D314DRG, serial#[id number]  . CARDIOVASCULAR STRESS TEST  04/05/2000   Scintigraphic evidence of mild LV dilatation and global LV dysfunction with an EF 41% with mild inferolateral ischemia.  Marland Kitchen CARPAL TUNNEL RELEASE Right 01/23/2016   Procedure: CARPAL TUNNEL RELEASE;  Surgeon: Carole Civil, MD;  Location: [facility];  Service: Orthopedics;  Laterality: Right;  pt knows to arrive at 6:15  . COLONOSCOPY  03/30/2011   Procedure: COLONOSCOPY;  Surgeon: Jamesetta So, MD;  Location: [facility];  Service: Gastroenterology;  Laterality: N/A;  . DORSAL COMPARTMENT RELEASE Right 08/11/2013   Procedure: RELEASE DORSAL COMPARTMENT (DEQUERVAIN);  Surgeon: Carole Civil, MD;  Location: [facility];  Service: Orthopedics;  Laterality: Right;  . ICD GENERATOR  CHANGEOUT N/A 10/29/2017   Procedure: ICD GENERATOR CHANGEOUT;  Surgeon: Diana Klein, MD;  Location: [facility];  Service: Cardiovascular;  Laterality: N/A;  . TRANSTHORACIC ECHOCARDIOGRAM  08/03/2011   EF 45-50%, mildly reduced LV systolic function.    Outpatient Medications Prior to Visit  Medication Sig Dispense Refill  . ACCU-CHEK AVIVA PLUS test strip USE TO TEST BLOOD SUGAR 4 TIMES A DAY (DX: E11.65) 100 each 4  . ACCU-CHEK FASTCLIX LANCETS MISC 1 each by Does not apply route 2 (two) times daily. 200 each 5  . acetaminophen (TYLENOL) 325 MG tablet Take 650 mg by mouth 3 (three) times daily.     Marland Kitchen amoxicillin (AMOXIL) 500 MG capsule Take 1 capsule (500 mg total) by mouth every 12 (twelve) hours. 28 capsule 0  . aspirin 325 MG EC tablet Take 81 mg by mouth daily.    Marland Kitchen atorvastatin (LIPITOR) 10 MG tablet Take 10 mg by mouth at bedtime.     . carvedilol (COREG) 12.5 MG tablet TAKE 1/2 TABLET BY MOUTH TWICE A DAY WITH A MEAL (Patient taking differently: Take 6.25 mg by mouth 2 (two) times daily. ) 90 tablet 3  . Cholecalciferol (VITAMIN D PO) Take 1 tablet by mouth daily.    . feeding supplement, ENSURE ENLIVE, (ENSURE ENLIVE) LIQD Take 237 mLs by mouth 2 (two) times daily between meals. 237 mL 12  . Insulin Lispro Prot & Lispro (HUMALOG MIX 75/25 KWIKPEN [place]) Inject 30-35 Units into the skin 2 (two) times daily before a meal.     . Lancets Misc. (ACCU-CHEK FASTCLIX LANCET) KIT 1 each by Does not apply route 2 (two) times daily. 1 kit 2  . traMADol (ULTRAM) 50 MG tablet Take 50 mg by mouth every 6 (six) hours as needed.    Marland Kitchen lisinopril (PRINIVIL,ZESTRIL) 5 MG tablet Take 2.5-5 mg by mouth See admin instructions. Take 5 mg by mouth in the morning and 2.5 at 4pm     No facility-administered medications prior to visit.      Allergies:   Patient has no known allergies.   Social History   Socioeconomic History  . Marital status: Married    Spouse name: Not on file  . Number of  children: Not on file  . Years of education: Not on file  . Highest education level: Not on file  Occupational History  . Not on file  Social Needs  . Financial resource strain: Not on file  . Food insecurity:    Worry: Not on file    Inability: Not on file  . Transportation needs:    Medical: Not on file    Non-medical: Not on file  Tobacco Use  . Smoking status: Never Smoker  . Smokeless tobacco: Current User    Types: Snuff  . Tobacco comment: 3 dips per day  Substance and Sexual Activity  . Alcohol use: No  . Drug use: No  . Sexual activity: Yes    Birth control/protection: None  Lifestyle  . Physical activity:    Days per week: Not on file    Minutes per session: Not on file  . Stress: Not on file  Relationships  . Social connections:    Talks on phone: Not on file  Gets together: Not on file    Attends religious service: Not on file    Active member of club or organization: Not on file    Attends meetings of clubs or organizations: Not on file    Relationship status: Not on file  Other Topics Concern  . Not on file  Social History Narrative  . Not on file     ROS:   Please see the history of present illness.    ROS All other systems are reviewed and are negative  PHYSICAL EXAM:   VS:  BP 120/63   Pulse 66   Ht _0  (1.676 m)   Wt 199 lb 6.4 oz (90.4 kg)   BMI 32.18 kg/m       General: Alert, oriented x3, no distress, mildly obese, well-healed left subclavian defibrillator site Head: no evidence of trauma, PERRL, EOMI, no exophtalmos or lid lag, no myxedema, no xanthelasma; normal ears, nose and oropharynx Neck: normal jugular venous pulsations and no hepatojugular reflux; brisk carotid pulses without delay and no carotid bruits Chest: clear to auscultation, no signs of consolidation by percussion or palpation, normal fremitus, symmetrical and full respiratory excursions Cardiovascular: normal position and quality of the apical impulse, regular  rhythm, normal first and paradoxically split second heart sounds, no murmurs, rubs or gallops Abdomen: no tenderness or distention, no masses by palpation, no abnormal pulsatility or arterial bruits, normal bowel sounds, no hepatosplenomegaly Extremities: no clubbing, cyanosis or edema; 2+ radial, ulnar and brachial pulses bilaterally; 2+ right femoral, posterior tibial and dorsalis pedis pulses; 2+ left femoral, posterior tibial and dorsalis pedis pulses; no subclavian or femoral bruits Neurological: grossly nonfocal Psych: Normal mood and affect    Wt Readings from Last 3 Encounters:  02/09/18 199 lb 6.4 oz (90.4 kg)  01/31/18 197 lb 12 oz (89.7 kg)  10/29/17 203 lb (92.1 kg)      Studies/Labs Reviewed:   EKG:  EKG is ordered today.  Atrial sensed rhythm (rare atrial paced beats), ventricular sensed, left bundle branch block.  Recent Labs: 01/29/2018: TSH 1.683 01/31/2018: Hemoglobin 11.5; Platelets 310 02/08/2018: ALT 18; BUN 13; Creat 0.77; Potassium 4.1; Sodium 144   Lipid Panel    Component Value Date/Time   CHOL 147 11/24/2016 0931   TRIG 75 11/24/2016 0931   HDL 67 11/24/2016 0931   CHOLHDL 2.2 11/24/2016 0931   VLDL 14 09/26/2015 0856   LDLCALC 64 11/24/2016 0931   ASSESSMENT:    1. Cardiomyopathy, nonischemic ([place])   2. Essential hypertension   3. ICD (implantable cardioverter-defibrillator) in place   4. Diabetes mellitus with nephropathy ([facility])   5. Paroxysmal atrial fibrillation ([place])   6. Hypercholesterolemia      PLAN:  In order of problems listed above:  1.CMP/CHF:  part of her problem seems to have been hypovolemia due to poor oral intake during her urinary infection.  Seems to be recovering.  Her weight is similar to previous trends now.Marland Kitchen  NYHA functional class II.  Recent ejection fraction 40% by echo performed in March 2019.  She is on carvedilol and lisinopril. 2. HTN:  Good control.  On ACE inhibitor and carvedilol.  I do not think there is room to  adjust her heart failure medications today (she might still be mildly hypovolemic). 3. ICD: Generator change out, normal device function.  Note that she has an abandoned lead and cannot unfortunately have MRIs even though she has an MRI conditional system. Despite the fact that she has a  broad left bundle branch block, she has improved left ventricular ejection fraction and good functional status and does not require CRT upgrade 4. DM: Normal creatinine level, has known diabetic nephropathy.  Hemoglobin A1c is above target, at 8.3%.   5. AFib: had one very brief episode in 2017, no recurrence.  I don't think anticoagulation is indicated 6. HLP: On statin.  LDL less than 70 at last check.    Medication Adjustments/Labs and Tests Ordered: Current medicines are reviewed at length with the patient today.  Concerns regarding medicines are outlined above.  Medication changes, Labs and Tests ordered today are listed in the Patient Instructions below. Patient Instructions  Medication Instructions:  Your physician recommends that you continue on your current medications as directed. Please refer to the Current Medication list given to you today.  If you need a refill on your cardiac medications before your next appointment, please call your pharmacy.   Lab work: None ordered If you have labs (blood work) drawn today and your tests are completely normal, you will receive your results only by: Marland Kitchen MyChart Message (if you have MyChart) OR . A paper copy in the mail If you have any lab test that is abnormal or we need to change your treatment, we will call you to review the results.  Testing/Procedures: None ordered  Follow-Up: At [facility], you and your health needs are our priority.  As part of our continuing mission to provide you with exceptional heart care, we have created designated Provider Care Teams.  These Care Teams include your primary Cardiologist (physician) and Advanced Practice  Providers (APPs -  Physician Assistants and Nurse Practitioners) who all work together to provide you with the care you need, when you need it. You will need a follow up appointment in 12 months.  Please call our office 2 months in advance to schedule this appointment.  You may see  Dr.Iyannah Blake or one of the following Advanced Practice Providers on your designated Care Team: Almyra Deforest, [place] . Fabian Sharp, PA-C         Signed, Diana Klein, MD  02/11/2018 8:06 AM    [facility] [address], [place], [place]  [address] Phone: [phone]; Fax: [phone]

## 2018-02-10 ENCOUNTER — Other Ambulatory Visit: Payer: Self-pay | Admitting: *Deleted

## 2018-02-10 ENCOUNTER — Encounter: Payer: Medicare Other | Admitting: Cardiovascular Disease

## 2018-02-10 MED ORDER — LISINOPRIL 2.5 MG PO TABS: ORAL_TABLET | ORAL | 11 refills | Status: DC

## 2018-02-14 ENCOUNTER — Other Ambulatory Visit: Payer: Self-pay | Admitting: "Endocrinology

## 2018-02-15 ENCOUNTER — Telehealth: Payer: Self-pay | Admitting: Cardiovascular Disease

## 2018-02-15 MED ORDER — LISINOPRIL 5 MG PO TABS: ORAL_TABLET | ORAL | 6 refills | Status: DC

## 2018-02-15 NOTE — Telephone Encounter (Signed)
New message   Pt c/o medication issue:  1. Name of Medication:lisinopril (PRINIVIL,ZESTRIL) 2.5 MG tablet  2. How are you currently taking this medication (dosage and times per day)? 1 time dai;y 1/2 tablet  3. Are you having a reaction (difficulty breathing--STAT)? No   4. What is your medication issue? Patient states that she can't pay $90.00 for this medicine would like to see if she can be put on another medication.

## 2018-02-15 NOTE — Telephone Encounter (Signed)
Returned call to patient she stated insurance will not cover Lisinopril.Spoke to pharmacist at [facility] she stated patient's insurance will not cover 3 tablets in a day,will cover 2 tablets.Prescription sent in for Lisinopril 5 mg in am and 1/2 tablet 2.5 mg in pm.

## 2018-02-15 NOTE — Telephone Encounter (Signed)
Returned call to patient she stated her insurance no longer covers Lisinopril.Advised I will send message to our pharmacist for advice.

## 2018-02-18 ENCOUNTER — Encounter: Payer: Self-pay | Admitting: "Endocrinology

## 2018-02-18 ENCOUNTER — Ambulatory Visit: Payer: Medicare Other | Admitting: "Endocrinology

## 2018-02-18 VITALS — BP 109/56 | HR 79 | Ht 67.0 in | Wt 197.0 lb

## 2018-02-18 DIAGNOSIS — Z794 Long term (current) use of insulin: Secondary | ICD-10-CM

## 2018-02-18 DIAGNOSIS — E782 Mixed hyperlipidemia: Secondary | ICD-10-CM

## 2018-02-18 DIAGNOSIS — I10 Essential (primary) hypertension: Secondary | ICD-10-CM

## 2018-02-18 DIAGNOSIS — E118 Type 2 diabetes mellitus with unspecified complications: Secondary | ICD-10-CM | POA: Diagnosis not present

## 2018-02-18 DIAGNOSIS — IMO0002 Reserved for concepts with insufficient information to code with codable children: Secondary | ICD-10-CM

## 2018-02-18 DIAGNOSIS — E1165 Type 2 diabetes mellitus with hyperglycemia: Secondary | ICD-10-CM

## 2018-02-18 NOTE — Patient Instructions (Signed)

## 2018-02-18 NOTE — Progress Notes (Signed)
Endocrinology follow-up note   Subjective:    Patient ID: Diana Campbell, female    DOB: 06/09/1945, PCP Scherrie Bateman   Past Medical History:  Diagnosis Date  . Arthritis   . Automatic implantable cardioverter-defibrillator in situ 07/2010  . Cardiac abnormality    with defibrilator  . Chronic combined systolic and diastolic CHF (congestive heart failure) ([place]) 09/04/2015  . Diabetes mellitus   . H/O cardiac arrest 2006  . Hypercholesteremia   . Hypertension   . Neuropathy    Past Surgical History:  Procedure Laterality Date  . 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  Marland Kitchen CARDIAC CATHETERIZATION  04/14/2000   Normal coronary arteries.  Marland Kitchen CARDIAC DEFIBRILLATOR PLACEMENT  07/21/2010   Medtronic Protecta XT DR, model#[id number], serial#[id number]  . CARDIOVASCULAR STRESS TEST  04/05/2000   Scintigraphic evidence of mild LV dilatation and global LV dysfunction with an EF 41% with mild inferolateral ischemia.  Marland Kitchen CARPAL TUNNEL RELEASE Right 01/23/2016   Procedure: CARPAL TUNNEL RELEASE;  Surgeon: Carole Civil, MD;  Location: [facility];  Service: Orthopedics;  Laterality: Right;  pt knows to arrive at 6:15  . COLONOSCOPY  03/30/2011   Procedure: COLONOSCOPY;  Surgeon: Jamesetta So, MD;  Location: [facility];  Service: Gastroenterology;  Laterality: N/A;  . DORSAL COMPARTMENT RELEASE Right 08/11/2013   Procedure: RELEASE DORSAL COMPARTMENT (DEQUERVAIN);  Surgeon: Carole Civil, MD;  Location: [facility];  Service: Orthopedics;  Laterality: Right;  . ICD GENERATOR CHANGEOUT N/A 10/29/2017   Procedure: ICD GENERATOR CHANGEOUT;  Surgeon: Sanda Klein, MD;  Location: [facility];  Service: Cardiovascular;  Laterality: N/A;  . TRANSTHORACIC ECHOCARDIOGRAM  08/03/2011   EF 45-50%, mildly reduced LV systolic  function.   Social History   Socioeconomic History  . Marital status: Married    Spouse name: Not on file  . Number of children: Not on file  . Years of education: Not on file  . Highest education level: Not on file  Occupational History  . Not on file  Social Needs  . Financial resource strain: Not on file  . Food insecurity:    Worry: Not on file    Inability: Not on file  . Transportation needs:    Medical: Not on file    Non-medical: Not on file  Tobacco Use  . Smoking status: Never Smoker  . Smokeless tobacco: Current User    Types: Snuff  . Tobacco comment: 3 dips per day  Substance and Sexual Activity  . Alcohol use: No  . Drug use: No  . Sexual activity: Yes    Birth control/protection: None  Lifestyle  . Physical activity:    Days per week: Not on file    Minutes per session: Not on file  . Stress: Not on file  Relationships  . Social connections:    Talks on phone: Not on file    Gets together: Not on file    Attends religious service: Not on file    Active member of club or organization: Not on file    Attends meetings of clubs or organizations: Not on file    Relationship status: Not on file  Other Topics Concern  . Not on file  Social History Narrative  . Not on file   Outpatient Encounter Medications as of 02/18/2018  Medication Sig  . ACCU-CHEK AVIVA  PLUS test strip USE TO TEST BLOOD SUGAR 4 TIMES A DAY (DX: E11.65)  . ACCU-CHEK FASTCLIX LANCETS MISC 1 each by Does not apply route 2 (two) times daily.  Marland Kitchen acetaminophen (TYLENOL) 325 MG tablet Take 650 mg by mouth 3 (three) times daily.   Marland Kitchen amoxicillin (AMOXIL) 500 MG capsule Take 1 capsule (500 mg total) by mouth every 12 (twelve) hours.  Marland Kitchen aspirin 325 MG EC tablet Take 81 mg by mouth daily.  Marland Kitchen atorvastatin (LIPITOR) 10 MG tablet Take 10 mg by mouth at bedtime.   . carvedilol (COREG) 12.5 MG tablet TAKE 1/2 TABLET BY MOUTH TWICE A DAY WITH A MEAL (Patient taking differently: Take 6.25 mg by mouth 2  (two) times daily. )  . Cholecalciferol (VITAMIN D PO) Take 1 tablet by mouth daily.  . feeding supplement, ENSURE ENLIVE, (ENSURE ENLIVE) LIQD Take 237 mLs by mouth 2 (two) times daily between meals.  . Insulin Lispro Prot & Lispro (HUMALOG MIX 75/25 KWIKPEN [place]) Inject 30 Units into the skin 2 (two) times daily before a meal.  . Lancets Misc. (ACCU-CHEK FASTCLIX LANCET) KIT 1 each by Does not apply route 2 (two) times daily.  Marland Kitchen lisinopril (PRINIVIL,ZESTRIL) 5 MG tablet Take 1 tablet 5 mg in morning and 1/2 tablet 2.5 mg in afternoon  . traMADol (ULTRAM) 50 MG tablet Take 50 mg by mouth every 6 (six) hours as needed.  . [DISCONTINUED] HUMALOG MIX 75/25 KWIKPEN (75-25) 100 UNIT/ML Kwikpen INJECT 40 UNITS INTO THE SKIN TWICE A DAY   No facility-administered encounter medications on file as of 02/18/2018.    ALLERGIES: No Known Allergies VACCINATION STATUS:  There is no immunization history on file for this patient.  Diabetes She presents for her follow-up diabetic visit. She has type 2 diabetes mellitus. Onset time: She was diagnosed at approximate age of 71 years. Her disease course has been improving . There are some mild asymptomatic hypoglycemic episodes.  Pertinent negatives for hypoglycemia include no confusion, headaches, pallor or seizures. There are no diabetic associated symptoms. Pertinent negatives for diabetes include no chest pain, no polydipsia, no polyphagia and no polyuria. There are no hypoglycemic complications. Symptoms are worsening. Diabetic complications include nephropathy. Risk factors for coronary artery disease include diabetes mellitus, dyslipidemia, hypertension, sedentary lifestyle and tobacco exposure. Current diabetic treatment includes intensive insulin program .   She has been changing around the doses of her insulin against the recommendations.  Her A1c is 6.7% improving from 8.3%, average blood glucose of 135 in her meter for the last 30 days.    Her weight is  stable, she complains of poor appetite. She is following a generally unhealthy diet, slowly eats "junk food".  She has not had a previous visit with a dietitian (She declined a referral to CDE.)   An ACE inhibitor/angiotensin II receptor blocker is being taken. Eye exam is current.    Hyperlipidemia This is a chronic problem. The current episode started more than 1 year ago. Pertinent negatives include no chest pain, myalgias or shortness of breath. Current antihyperlipidemic treatment includes statins. Risk factors for coronary artery disease include dyslipidemia, diabetes mellitus and a sedentary lifestyle.  Hypertension This is a chronic problem. The current episode started more than 1 year ago. The problem is controlled. Pertinent negatives include no chest pain, headaches, palpitations or shortness of breath. Past treatments include ACE inhibitors. Hypertensive end-organ damage includes kidney disease.     Review of Systems  Constitutional: Negative for unexpected weight change.  HENT: Negative for trouble swallowing and voice change.    Eyes: Negative for visual disturbance.  Respiratory: Negative for cough, shortness of breath and wheezing.   Cardiovascular: For chest pain, palpitations, leg swelling.    Gastrointestinal: Negative for nausea, vomiting and diarrhea.  Endocrine: Negative for cold intolerance, heat intolerance, polydipsia, polyphagia and polyuria.  Musculoskeletal: She recently fell and sustained injury to her left lower extremity status post x-ray with no evidence of fracture.  She walks with a cane. Neurological: Negative for seizures and headaches.  Psychiatric/Behavioral: Negative for suicidal ideas and confusion.    Objective:    BP (!) 109/56   Pulse 79   Ht _0  (1.702 m)   Wt 197 lb (89.4 kg)   BMI 30.85 kg/m   Wt Readings from Last 3 Encounters:  02/18/18 197 lb (89.4 kg)  02/09/18 199 lb 6.4 oz (90.4 kg)  01/31/18 197 lb 12 oz (89.7 kg)     Physical Exam  Constitutional: She is oriented to person, place, and time. She appears well-developed.  Walks with a cane. HENT:  Head: Normocephalic and atraumatic.  Eyes: EOM are normal.  Neck: Normal range of motion. Neck supple. No tracheal deviation present. No thyromegaly present.   Musculoskeletal:  She exhibits no edema. Small area of bruise on left total from her fall yesterday.  She had x-ray of hip and knee with no fractures. Neurological: She is alert and oriented to person, place, and time. She has normal reflexes. No cranial nerve deficit. Coordination normal.  Skin: Skin is warm and dry. No rash noted. No erythema. No pallor.  Psychiatric: She has a reluctant affect.      CMP Latest Ref Rng & Units 02/08/2018 01/30/2018 01/29/2018  Glucose 65 - 99 mg/dL 106(H) 215(H) 148(H)  BUN 7 - 25 mg/dL _1 Creatinine 0.60 - 0.93 mg/dL 0.77 0.86 0.99  Sodium 135 - 146 mmol/L 144 136 137  Potassium 3.5 - 5.3 mmol/L 4.1 3.6 3.8  Chloride 98 - 110 mmol/L 106 102 104  CO2 20 - 32 mmol/L _2 Calcium 8.6 - 10.4 mg/dL 9.7 9.6 9.5  Total Protein 6.1 - 8.1 g/dL 6.9 - -  Total Bilirubin 0.2 - 1.2 mg/dL 0.3 - -  Alkaline Phos 38 - 126 U/L - - -  AST 10 - 35 U/L 15 - -  ALT 6 - 29 U/L 18 - -    Lab Results  Component Value Date   HGBA1C 6.7 (H) 02/08/2018   HGBA1C 8.3 (H) 09/15/2017   HGBA1C 8.2 (H) 06/07/2017   Lipid Panel     Component Value Date/Time   CHOL 147 11/24/2016 0931   TRIG 75 11/24/2016 0931   HDL 67 11/24/2016 0931   CHOLHDL 2.2 11/24/2016 0931   VLDL 14 09/26/2015 0856   LDLCALC 64 11/24/2016 0931     Assessment & Plan:   1. Type 2 diabetes mellitus with long-term current use of insulin (HCC)  -Her diabetes is complicated by cardiomyopathy and she remains at a high risk for more acute and chronic complications of diabetes which include CAD, CVA, CKD, retinopathy, and neuropathy. These are all discussed in detail with the patient.  Patient came  with her meter showing average blood glucose of 135 over the last 30 days, A1c of 6.7% improving from 8.3%.    - Glucose logs and insulin administration records pertaining to this visit,  to be scanned into patient's records.  Recent labs reviewed.   -  I have re-counseled the patient on diet management and weight loss  by adopting a carbohydrate restricted / protein rich  Diet. - Patient admits there is a room for improvement in her diet and drink choices. -  Suggestion is made for her to avoid simple carbohydrates  from her diet including Cakes, Sweet Desserts / Pastries, Ice Cream, Soda (diet and regular), Sweet Tea, Candies, Chips, Cookies, Store Bought Juices, Alcohol in Excess of  1-2 drinks a day, Artificial Sweeteners, and "Sugar-free" Products. This will help patient to have stable blood glucose profile and potentially avoid unintended weight gain.  - Patient is advised to stick to a routine mealtimes to eat 3 meals  a day and avoid unnecessary snacks ( to snack only to correct hypoglycemia).  - The patient  declined referral to a  CDE for individualized DM education.  - I have approached patient with the following individualized plan to manage diabetes and patient agrees.   - Patient  worries a lot about hypoglycemia, she changes around her insulin doses against recommendations.    -  She wants to avoid lowering blood glucose to below 100. -She is advised to lower her Humalog 75/25 to 30 units with breakfast and 30 units with supper for pre-meal blood glucose readings above 90 mg/dL, associated with monitoring of blood glucose 4 times a day-before meals and at bedtime.    -Patient is encouraged to call clinic for blood glucose levels less than 70 or above 200 mg /dl.  -Her labs show her renal function is improving. She would have benefited from metformin however she reports intolerance to metformin.   - Patient specific target  for A1c; LDL, HDL, Triglycerides, and  Waist  Circumference were discussed in detail.  2) BP/HTN: Her blood pressure is controlled to target.  She is advised to continue her current blood pressure medications including lisinopril 7.5 mg p.o. daily.    3) Lipids/HPL: Recent lipid panel showed controlled LDL at 64.  She is advised to continue atorvastatin 10 mg p.o. nightly.   4)  Weight/Diet: She declined CDE consult . exercise, and carbohydrates information provided.  5) Chronic Care/Health Maintenance:  -Patient is on ACEI/ARB and Statin medications and encouraged to continue to follow up with Ophthalmology, Podiatrist at least yearly or according to recommendations, and advised to  stay away from smoking. I have recommended yearly flu vaccine and pneumonia vaccination at least every 5 years; moderate intensity exercise for up to 150 minutes weekly; and  sleep for at least 7 hours a day.   - I advised patient to maintain close follow up with Jake Samples, PA-C for primary care needs.  - Time spent with the patient: 25 min, of which >50% was spent in reviewing her blood glucose logs , discussing her hypoglycemia and hyperglycemia episodes, reviewing her current and  previous labs / studies and medications  doses and developing a plan to avoid hypoglycemia and hyperglycemia. Please refer to Patient Instructions for Blood Glucose Monitoring and Insulin/Medications Dosing Guide"  in media tab for additional information. Kyiah Golda Acre participated in the discussions, expressed understanding, and voiced agreement with the above plans.  All questions were answered to her satisfaction. she is encouraged to contact clinic should she have any questions or concerns prior to her return visit.   Follow up plan: -Return in about 4 months (around 06/19/2018) for Follow up with Pre-visit Labs, Meter, and Logs.  Glade Lloyd, MD Phone: [phone]  Fax: [phone]  -  This note was partially dictated with voice recognition software. Similar  sounding words can be transcribed inadequately or may not  be corrected upon review.  02/18/2018, 9:10 AM

## 2018-02-25 LAB — CUP PACEART INCLINIC DEVICE CHECK
Date Time Interrogation Session: 20200207102644
Implantable Lead Implant Date: 20120702
Implantable Lead Location: 753859
Implantable Lead Location: 753860
Implantable Lead Model: 5076
Implantable Lead Model: 6947
Implantable Pulse Generator Implant Date: 20191011
MDC IDC LEAD IMPLANT DT: 20120702

## 2018-04-20 ENCOUNTER — Telehealth: Payer: Self-pay | Admitting: Cardiovascular Disease

## 2018-04-20 ENCOUNTER — Telehealth: Payer: Self-pay

## 2018-04-20 NOTE — Telephone Encounter (Signed)
Per Raynelle Fanning, LPN--ok to cancel appt if no cardiac symptoms. Left message on home vm--ok per DPR--to call back when she can to discuss.

## 2018-04-20 NOTE — Telephone Encounter (Signed)
Called patient, LVM advising her to call back regarding her appointment for Friday giving the option of keeping it the same via telephone visit, or cancelling and rescheduling when available. Gave call back number to discuss.

## 2018-04-22 ENCOUNTER — Ambulatory Visit: Payer: Medicare Other | Admitting: Cardiovascular Disease

## 2018-04-28 ENCOUNTER — Other Ambulatory Visit: Payer: Self-pay | Admitting: "Endocrinology

## 2018-05-03 ENCOUNTER — Ambulatory Visit (INDEPENDENT_AMBULATORY_CARE_PROVIDER_SITE_OTHER): Payer: Medicare Other | Admitting: *Deleted

## 2018-05-03 DIAGNOSIS — I428 Other cardiomyopathies: Secondary | ICD-10-CM | POA: Diagnosis not present

## 2018-05-03 LAB — CUP PACEART REMOTE DEVICE CHECK
Battery Remaining Longevity: 117 mo
Battery Voltage: 3.05 V
Brady Statistic AP VP Percent: 0.03 %
Brady Statistic AP VS Percent: 85.91 %
Brady Statistic AS VP Percent: 0.01 %
Brady Statistic AS VS Percent: 14.05 %
Brady Statistic RA Percent Paced: 85.55 %
Brady Statistic RV Percent Paced: 0.04 %
Date Time Interrogation Session: 20200414153836
HighPow Impedance: 39 Ohm
HighPow Impedance: 49 Ohm
Implantable Lead Implant Date: 20120702
Implantable Lead Implant Date: 20120702
Implantable Lead Location: 753859
Implantable Lead Location: 753860
Implantable Lead Model: 5076
Implantable Lead Model: 6947
Implantable Pulse Generator Implant Date: 20191011
Lead Channel Impedance Value: 342 Ohm
Lead Channel Impedance Value: 437 Ohm
Lead Channel Impedance Value: 456 Ohm
Lead Channel Pacing Threshold Amplitude: 0.625 V
Lead Channel Pacing Threshold Amplitude: 0.875 V
Lead Channel Pacing Threshold Pulse Width: 0.4 ms
Lead Channel Pacing Threshold Pulse Width: 0.4 ms
Lead Channel Sensing Intrinsic Amplitude: 10 mV
Lead Channel Sensing Intrinsic Amplitude: 10 mV
Lead Channel Sensing Intrinsic Amplitude: 2.125 mV
Lead Channel Sensing Intrinsic Amplitude: 2.125 mV
Lead Channel Setting Pacing Amplitude: 1.75 V
Lead Channel Setting Pacing Amplitude: 2 V
Lead Channel Setting Pacing Pulse Width: 0.4 ms
Lead Channel Setting Sensing Sensitivity: 0.3 mV

## 2018-05-04 ENCOUNTER — Other Ambulatory Visit: Payer: Self-pay

## 2018-05-09 ENCOUNTER — Telehealth: Payer: Self-pay

## 2018-05-09 ENCOUNTER — Other Ambulatory Visit: Payer: Self-pay | Admitting: "Endocrinology

## 2018-05-09 NOTE — Telephone Encounter (Signed)
Referred to [facility] clinic by Dr Royann Shivers after reviewing [facility] 05/03/2018 report and abnormal Optivol. Attempted call to patient for ICM intro and left message for return call.  Scheduled remote transmission for 05/23/2018 if pt is agreeable to ICM follow up.

## 2018-05-10 ENCOUNTER — Encounter: Payer: Self-pay | Admitting: Cardiology

## 2018-05-10 NOTE — Progress Notes (Signed)
Remote ICD transmission.   

## 2018-05-31 NOTE — Telephone Encounter (Signed)
Attempted ICM Intro Call to patient and no answer.

## 2018-06-06 NOTE — Telephone Encounter (Signed)
Attempted ICM intro call and left message to return call with direct ICM number.

## 2018-06-07 NOTE — Telephone Encounter (Signed)
Spoke with patient and agreeable to monthly follow up.  Advised monitor should be by bedside in order for it to automatically transmit a report during sleep hours of 12 midnight and 6 AM.  Patient confirmed monitor is at bedside. Advised will receive a call after the transmission is reviewed to provide results.   Provided ICM number and explained should call if experiencing any fluid symptoms such as weight gain, shortness of breath or extremity/abdominal swelling. 1st ICM remote scheduled for 06/14/2018.

## 2018-06-14 ENCOUNTER — Telehealth: Payer: Self-pay

## 2018-06-14 ENCOUNTER — Other Ambulatory Visit: Payer: Self-pay

## 2018-06-14 ENCOUNTER — Ambulatory Visit (INDEPENDENT_AMBULATORY_CARE_PROVIDER_SITE_OTHER): Payer: Medicare Other

## 2018-06-14 DIAGNOSIS — I5042 Chronic combined systolic (congestive) and diastolic (congestive) heart failure: Secondary | ICD-10-CM

## 2018-06-14 DIAGNOSIS — Z4502 Encounter for adjustment and management of automatic implantable cardiac defibrillator: Secondary | ICD-10-CM | POA: Diagnosis not present

## 2018-06-14 NOTE — Progress Notes (Signed)
EPIC Encounter for ICM Monitoring  Patient Name: Diana Campbell is a 73 y.o. female Date: 06/14/2018 Primary Care Physican: Ladon Applebaum Primary Cardiologist: Tresa Endo Electrophysiologist: [place] 02/09/2018 Weight: 199 lbs (in office)       1st ICM remote transmission. Attempted call to patient and unable to reach.  Transmission reviewed.    Report: Thoracic impedance normal but was slightly abnormal suggesting fluid accumulation from 05/30/2018 - 06/07/2018.   Prescribed: No diuretic  Labs: 02/08/2018 Creatinine 0.77, BUN 13, Potassium 4.1, Sodium 144, GFR 76-88 01/30/2018 Creatinine 0.86, BUN 11, Potassium 3.6, Sodium 136, GFR >60  01/29/2018 Creatinine 0.99, BUN 12, Potassium 3.8, Sodium 137, GFR 56->60  A complete set of results can be found in Results Review.   Recommendations: Unable to reach.  Follow-up plan: ICM clinic phone appointment on 07/15/2018.    Copy of ICM check sent to Dr. Royann Shivers.   3 month ICM trend: 06/14/2018    1 Year ICM trend:       [name], RN 06/14/2018 3:07 PM

## 2018-06-14 NOTE — Progress Notes (Signed)
Thank you :)

## 2018-06-14 NOTE — Telephone Encounter (Signed)
Remote ICM transmission received.  Attempted call to patient regarding ICM remote transmission and no answer or voice mail. ° °

## 2018-06-20 ENCOUNTER — Ambulatory Visit: Payer: Medicare Other | Admitting: "Endocrinology

## 2018-06-21 LAB — LIPID PANEL
Cholesterol: 164 mg/dL (ref <200)
HDL: 64 mg/dL (ref >=50)
LDL Cholesterol (Calc): 81 mg/dL (calc)
Non-HDL Cholesterol (Calc): 100 mg/dL (calc) (ref <130)
Total CHOL/HDL Ratio: 2.6 (calc) (ref <5.0)
Triglycerides: 94 mg/dL (ref <150)

## 2018-06-21 LAB — HEMOGLOBIN A1C
Hgb A1c MFr Bld: 6.6 % of total Hgb — ABNORMAL HIGH (ref <5.7)
Mean Plasma Glucose: 143 (calc)
eAG (mmol/L): 7.9 (calc)

## 2018-06-21 LAB — COMPLETE METABOLIC PANEL WITH GFR
AG RATIO: 1.3 (calc) (ref 1.0–2.5)
ALT: 9 U/L (ref 6–29)
AST: 15 U/L (ref 10–35)
Albumin: 3.9 g/dL (ref 3.6–5.1)
Alkaline phosphatase (APISO): 64 U/L (ref 37–153)
BUN / CREAT RATIO: 18 (calc) (ref 6–22)
BUN: 18 mg/dL (ref 7–25)
CO2: 29 mmol/L (ref 20–32)
Calcium: 9.8 mg/dL (ref 8.6–10.4)
Chloride: 108 mmol/L (ref 98–110)
Creat: 1.02 mg/dL — ABNORMAL HIGH (ref 0.60–0.93)
GFR, Est African American: 63 mL/min/{1.73_m2} (ref >=60)
GFR, Est Non African American: 54 mL/min/{1.73_m2} — ABNORMAL LOW (ref >=60)
Globulin: 3 g/dL (calc) (ref 1.9–3.7)
Glucose, Bld: 111 mg/dL — ABNORMAL HIGH (ref 65–99)
Potassium: 4.6 mmol/L (ref 3.5–5.3)
SODIUM: 143 mmol/L (ref 135–146)
Total Bilirubin: 0.3 mg/dL (ref 0.2–1.2)
Total Protein: 6.9 g/dL (ref 6.1–8.1)

## 2018-06-21 LAB — VITAMIN D 25 HYDROXY (VIT D DEFICIENCY, FRACTURES): Vit D, 25-Hydroxy: 36 ng/mL (ref 30–100)

## 2018-06-24 ENCOUNTER — Ambulatory Visit: Payer: Medicare Other | Admitting: "Endocrinology

## 2018-06-28 ENCOUNTER — Encounter: Payer: Self-pay | Admitting: "Endocrinology

## 2018-06-28 ENCOUNTER — Other Ambulatory Visit: Payer: Self-pay

## 2018-06-28 ENCOUNTER — Ambulatory Visit (INDEPENDENT_AMBULATORY_CARE_PROVIDER_SITE_OTHER): Payer: Medicare Other | Admitting: "Endocrinology

## 2018-06-28 VITALS — BP 134/64 | HR 64 | Ht 67.0 in | Wt 202.0 lb

## 2018-06-28 DIAGNOSIS — I10 Essential (primary) hypertension: Secondary | ICD-10-CM

## 2018-06-28 DIAGNOSIS — E1165 Type 2 diabetes mellitus with hyperglycemia: Secondary | ICD-10-CM

## 2018-06-28 DIAGNOSIS — E118 Type 2 diabetes mellitus with unspecified complications: Secondary | ICD-10-CM | POA: Diagnosis not present

## 2018-06-28 DIAGNOSIS — IMO0002 Reserved for concepts with insufficient information to code with codable children: Secondary | ICD-10-CM

## 2018-06-28 DIAGNOSIS — E782 Mixed hyperlipidemia: Secondary | ICD-10-CM | POA: Diagnosis not present

## 2018-06-28 DIAGNOSIS — Z794 Long term (current) use of insulin: Secondary | ICD-10-CM

## 2018-06-28 NOTE — Progress Notes (Signed)
Endocrinology follow-up note   Subjective:    Patient ID: Diana Campbell, female    DOB: 1945-12-10, PCP Scherrie Bateman   Past Medical History:  Diagnosis Date  . Arthritis   . Automatic implantable cardioverter-defibrillator in situ 07/2010  . Cardiac abnormality    with defibrilator  . Chronic combined systolic and diastolic CHF (congestive heart failure) ([place]) 09/04/2015  . Diabetes mellitus   . H/O cardiac arrest 2006  . Hypercholesteremia   . Hypertension   . Neuropathy    Past Surgical History:  Procedure Laterality Date  . 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  Marland Kitchen CARDIAC CATHETERIZATION  04/14/2000   Normal coronary arteries.  Marland Kitchen CARDIAC DEFIBRILLATOR PLACEMENT  07/21/2010   Medtronic Protecta XT DR, model#D314DRG, serial#[id number]  . CARDIOVASCULAR STRESS TEST  04/05/2000   Scintigraphic evidence of mild LV dilatation and global LV dysfunction with an EF 41% with mild inferolateral ischemia.  Marland Kitchen CARPAL TUNNEL RELEASE Right 01/23/2016   Procedure: CARPAL TUNNEL RELEASE;  Surgeon: Carole Civil, MD;  Location: [facility];  Service: Orthopedics;  Laterality: Right;  pt knows to arrive at 6:15  . COLONOSCOPY  03/30/2011   Procedure: COLONOSCOPY;  Surgeon: Jamesetta So, MD;  Location: [facility];  Service: Gastroenterology;  Laterality: N/A;  . DORSAL COMPARTMENT RELEASE Right 08/11/2013   Procedure: RELEASE DORSAL COMPARTMENT (DEQUERVAIN);  Surgeon: Carole Civil, MD;  Location: [facility];  Service: Orthopedics;  Laterality: Right;  . ICD GENERATOR CHANGEOUT N/A 10/29/2017   Procedure: ICD GENERATOR CHANGEOUT;  Surgeon: Sanda Klein, MD;  Location: [facility] [place] [facility];  Service: Cardiovascular;  Laterality: N/A;  . TRANSTHORACIC ECHOCARDIOGRAM  08/03/2011   EF 45-50%, mildly reduced LV systolic  function.   Social History   Socioeconomic History  . Marital status: Married    Spouse name: Not on file  . Number of children: Not on file  . Years of education: Not on file  . Highest education level: Not on file  Occupational History  . Not on file  Social Needs  . Financial resource strain: Not on file  . Food insecurity:    Worry: Not on file    Inability: Not on file  . Transportation needs:    Medical: Not on file    Non-medical: Not on file  Tobacco Use  . Smoking status: Never Smoker  . Smokeless tobacco: Current User    Types: Snuff  . Tobacco comment: 3 dips per day  Substance and Sexual Activity  . Alcohol use: No  . Drug use: No  . Sexual activity: Yes    Birth control/protection: None  Lifestyle  . Physical activity:    Days per week: Not on file    Minutes per session: Not on file  . Stress: Not on file  Relationships  . Social connections:    Talks on phone: Not on file    Gets together: Not on file    Attends religious service: Not on file    Active member of club or organization: Not on file    Attends meetings of clubs or organizations: Not on file    Relationship status: Not on file  Other Topics Concern  . Not on file  Social History Narrative  . Not on file   Outpatient Encounter Medications as of 06/28/2018  Medication Sig  . ACCU-CHEK AVIVA  PLUS test strip USE TO TEST BLOOD SUGAR 4 TIMES A DAY (DX: E11.65)  . ACCU-CHEK FASTCLIX LANCETS MISC 1 each by Does not apply route 2 (two) times daily.  Marland Kitchen acetaminophen (TYLENOL) 325 MG tablet Take 650 mg by mouth 3 (three) times daily.   Marland Kitchen aspirin 325 MG EC tablet Take 81 mg by mouth daily.  Marland Kitchen atorvastatin (LIPITOR) 10 MG tablet Take 10 mg by mouth at bedtime.   . carvedilol (COREG) 12.5 MG tablet TAKE 1/2 TABLET BY MOUTH TWICE A DAY WITH A MEAL (Patient taking differently: Take 6.25 mg by mouth 2 (two) times daily. )  . Cholecalciferol (VITAMIN D PO) Take 1 tablet by mouth daily.  Marland Kitchen HUMALOG MIX  75/25 KWIKPEN (75-25) 100 UNIT/ML Kwikpen INJECT 40 UNITS INTO THE SKIN TWICE A DAY (Patient taking differently: 30 Units. )  . Lancets Misc. (ACCU-CHEK FASTCLIX LANCET) KIT 1 each by Does not apply route 2 (two) times daily.  Marland Kitchen lisinopril (PRINIVIL,ZESTRIL) 5 MG tablet Take 1 tablet 5 mg in morning and 1/2 tablet 2.5 mg in afternoon  . traMADol (ULTRAM) 50 MG tablet Take 50 mg by mouth every 6 (six) hours as needed.  . [DISCONTINUED] amoxicillin (AMOXIL) 500 MG capsule Take 1 capsule (500 mg total) by mouth every 12 (twelve) hours.  . [DISCONTINUED] feeding supplement, ENSURE ENLIVE, (ENSURE ENLIVE) LIQD Take 237 mLs by mouth 2 (two) times daily between meals.  . [DISCONTINUED] Insulin Lispro Prot & Lispro (HUMALOG MIX 75/25 KWIKPEN [place]) Inject 30 Units into the skin 2 (two) times daily before a meal.   No facility-administered encounter medications on file as of 06/28/2018.    ALLERGIES: No Known Allergies VACCINATION STATUS:  There is no immunization history on file for this patient.  Diabetes She presents for her follow-up diabetic visit. She has type 2 diabetes mellitus. Onset time: She was diagnosed at approximate age of 42 years. Her disease course has been improving . There are some mild asymptomatic hypoglycemic episodes.  Pertinent negatives for hypoglycemia include no confusion, headaches, pallor or seizures. There are no diabetic associated symptoms. Pertinent negatives for diabetes include no chest pain, no polydipsia, no polyphagia and no polyuria. There are no hypoglycemic complications. Symptoms are worsening. Diabetic complications include nephropathy. Risk factors for coronary artery disease include diabetes mellitus, dyslipidemia, hypertension, sedentary lifestyle and tobacco exposure. Current diabetic treatment includes intensive insulin program .   She has been changing around the doses of her insulin against the recommendations.  Her A1c is 6.6 percent, overall improving from  8.3%.  Her weight is stable, she complains of poor appetite. She is following a generally unhealthy diet, slowly eats "junk food".  She has not had a previous visit with a dietitian (She declined a referral to CDE.)   An ACE inhibitor/angiotensin II receptor blocker is being taken. Eye exam is current.    Hyperlipidemia This is a chronic problem. The current episode started more than 1 year ago. Pertinent negatives include no chest pain, myalgias or shortness of breath. Current antihyperlipidemic treatment includes statins. Risk factors for coronary artery disease include dyslipidemia, diabetes mellitus and a sedentary lifestyle.  Hypertension This is a chronic problem. The current episode started more than 1 year ago. The problem is controlled. Pertinent negatives include no chest pain, headaches, palpitations or shortness of breath. Past treatments include ACE inhibitors. Hypertensive end-organ damage includes kidney disease.     Review of Systems  Constitutional: Negative for unexpected weight change.  HENT: Negative for trouble swallowing  and voice change.    Eyes: Negative for visual disturbance.  Respiratory: Negative for cough, shortness of breath and wheezing.   Cardiovascular: For chest pain, palpitations, leg swelling.    Gastrointestinal: Negative for nausea, vomiting and diarrhea.  Endocrine: Negative for cold intolerance, heat intolerance, polydipsia, polyphagia and polyuria.  Musculoskeletal: She recently fell and sustained injury to her left lower extremity status post x-ray with no evidence of fracture.  She walks with a cane. Neurological: Negative for seizures and headaches.  Psychiatric/Behavioral: Negative for suicidal ideas and confusion.    Objective:    BP 134/64   Pulse 64   Ht 5' 7"  (1.702 m)   Wt 202 lb (91.6 kg)   BMI 31.64 kg/m   Wt Readings from Last 3 Encounters:  06/28/18 202 lb (91.6 kg)  02/18/18 197 lb (89.4 kg)  02/09/18 199 lb 6.4 oz (90.4 kg)     Physical Exam  Constitutional:  not in acute distress, normal state of mind Eyes:  EOMI, no exophthalmos Neck: Supple Respiratory: Adequate breathing efforts Musculoskeletal: no gross deformities, strength intact in all four extremities Skin:  no rashes, no hyperemia Neurological: no tremor with outstretched hands.    CMP Latest Ref Rng & Units 06/20/2018 02/08/2018 01/30/2018  Glucose 65 - 99 mg/dL 111(H) 106(H) 215(H)  BUN 7 - 25 mg/dL 18 13 11   Creatinine 0.60 - 0.93 mg/dL 1.02(H) 0.77 0.86  Sodium 135 - 146 mmol/L 143 144 136  Potassium 3.5 - 5.3 mmol/L 4.6 4.1 3.6  Chloride 98 - 110 mmol/L 108 106 102  CO2 20 - 32 mmol/L 29 29 24   Calcium 8.6 - 10.4 mg/dL 9.8 9.7 9.6  Total Protein 6.1 - 8.1 g/dL 6.9 6.9 -  Total Bilirubin 0.2 - 1.2 mg/dL 0.3 0.3 -  Alkaline Phos 38 - 126 U/L - - -  AST 10 - 35 U/L 15 15 -  ALT 6 - 29 U/L 9 18 -    Lab Results  Component Value Date   HGBA1C 6.6 (H) 06/20/2018   HGBA1C 6.7 (H) 02/08/2018   HGBA1C 8.3 (H) 09/15/2017   Lipid Panel     Component Value Date/Time   CHOL 164 06/20/2018 0900   TRIG 94 06/20/2018 0900   HDL 64 06/20/2018 0900   CHOLHDL 2.6 06/20/2018 0900   VLDL 14 09/26/2015 0856   LDLCALC 81 06/20/2018 0900     Assessment & Plan:   1. Type 2 diabetes mellitus with long-term current use of insulin (HCC)  -Her diabetes is complicated by cardiomyopathy and she remains at a high risk for more acute and chronic complications of diabetes which include CAD, CVA, CKD, retinopathy, and neuropathy. These are all discussed in detail with the patient.  Patient came with blood glucose readings near target, A1c of 6.6%, overall improving.    Recent labs reviewed.   - I have re-counseled the patient on diet management and weight loss  by adopting a carbohydrate restricted / protein rich  Diet.  - she  admits there is a room for improvement in her diet and drink choices. -  Suggestion is made for her to avoid simple  carbohydrates  from her diet including Cakes, Sweet Desserts / Pastries, Ice Cream, Soda (diet and regular), Sweet Tea, Candies, Chips, Cookies, Sweet Pastries,  Store Bought Juices, Alcohol in Excess of  1-2 drinks a day, Artificial Sweeteners, Coffee Creamer, and "Sugar-free" Products. This will help patient to have stable blood glucose profile and potentially avoid unintended  weight gain.   - Patient is advised to stick to a routine mealtimes to eat 3 meals  a day and avoid unnecessary snacks ( to snack only to correct hypoglycemia).  - The patient  declined referral to a  CDE for individualized DM education.  - I have approached patient with the following individualized plan to manage diabetes and patient agrees.   - Patient  worries a lot about hypoglycemia, she changes around her insulin doses against recommendations.   -She has done very well on her current premixed insulin regimen. -She is advised to continue Humalog75/25  30 units with breakfast and 30 units with supper for pre-meal blood glucose readings above 90 mg/dL, associated with monitoring of blood glucose 4 times a day-before meals and at bedtime.    -Patient is encouraged to call clinic for blood glucose levels less than 70 or above 200 mg /dl.  -Her labs show her renal function is improving. She would have benefited from metformin however she reports intolerance to metformin.   - Patient specific target  for A1c; LDL, HDL, Triglycerides, and  Waist Circumference were discussed in detail.  2) BP/HTN: Her blood pressure is controlled to target.   She is advised to continue her current blood pressure medications including lisinopril 7.5 mg p.o. daily.    3) Lipids/HPL: Recent lipid panel showed controlled LDL at 64.  She is advised to continue atorvastatin 10 mg p.o. nightly.   4)  Weight/Diet: She declined CDE consult . exercise, and carbohydrates information provided.  5) Chronic Care/Health Maintenance:  -Patient is on  ACEI/ARB and Statin medications and encouraged to continue to follow up with Ophthalmology, Podiatrist at least yearly or according to recommendations, and advised to  stay away from smoking. I have recommended yearly flu vaccine and pneumonia vaccination at least every 5 years; moderate intensity exercise for up to 150 minutes weekly; and  sleep for at least 7 hours a day.   - I advised patient to maintain close follow up with Jake Samples, PA-C for primary care needs.  - Time spent with the patient: 25 min, of which >50% was spent in reviewing her blood glucose logs , discussing her hypoglycemia and hyperglycemia episodes, reviewing her current and  previous labs / studies and medications  doses and developing a plan to avoid hypoglycemia and hyperglycemia. Please refer to Patient Instructions for Blood Glucose Monitoring and Insulin/Medications Dosing Guide"  in media tab for additional information. Please  also refer to " Patient Self Inventory" in the Media  tab for reviewed elements of pertinent patient history.  Dorothyann Golda Acre participated in the discussions, expressed understanding, and voiced agreement with the above plans.  All questions were answered to her satisfaction. she is encouraged to contact clinic should she have any questions or concerns prior to her return visit.    Follow up plan: -Return in about 6 months (around 12/28/2018) for Follow up with Pre-visit Labs, Meter, and Logs.  Glade Lloyd, MD Phone: [phone]  Fax: [phone]  -  This note was partially dictated with voice recognition software. Similar sounding words can be transcribed inadequately or may not  be corrected upon review.  06/28/2018, 12:44 PM

## 2018-06-28 NOTE — Patient Instructions (Signed)

## 2018-07-15 ENCOUNTER — Ambulatory Visit (INDEPENDENT_AMBULATORY_CARE_PROVIDER_SITE_OTHER): Payer: Medicare Other

## 2018-07-15 DIAGNOSIS — I5042 Chronic combined systolic (congestive) and diastolic (congestive) heart failure: Secondary | ICD-10-CM | POA: Diagnosis not present

## 2018-07-15 DIAGNOSIS — Z95810 Presence of automatic (implantable) cardiac defibrillator: Secondary | ICD-10-CM | POA: Diagnosis not present

## 2018-07-19 ENCOUNTER — Telehealth: Payer: Self-pay

## 2018-07-19 ENCOUNTER — Telehealth: Payer: Self-pay | Admitting: Cardiovascular Disease

## 2018-07-19 NOTE — Telephone Encounter (Signed)
Pt states that she has done a download last week and want to make sure everything is ok.

## 2018-07-19 NOTE — Telephone Encounter (Signed)
Spoke to pt regarding transmission from 6/26. Informed her that there were no events or episodes. Pt had no further questions.

## 2018-07-19 NOTE — Telephone Encounter (Signed)
Remote ICM transmission received.  Attempted call to patient regarding ICM remote transmission and left message, per DPR, to return call.    

## 2018-07-19 NOTE — Telephone Encounter (Signed)
New message:     Patient calling concerning she has not heard from anyone concerning her device. Please call patient concerning this appt.

## 2018-07-19 NOTE — Progress Notes (Signed)
Great, thanks

## 2018-07-19 NOTE — Progress Notes (Signed)
EPIC Encounter for ICM Monitoring  Patient Name: Diana Campbell is a 73 y.o. female Date: 07/19/2018 Primary Care Physican: Scherrie Bateman Primary Cardiologist: Claiborne Billings Electrophysiologist: [place] 02/09/2018 Weight: 199 lbs (in office)                                                           Attempted call to patient and unable to reach.  Transmission reviewed.    Optivol thoracic impedance normal.   Prescribed: No diuretic  Labs: 02/08/2018 Creatinine 0.77, BUN 13, Potassium 4.1, Sodium 144, GFR 76-88 01/30/2018 Creatinine 0.86, BUN 11, Potassium 3.6, Sodium 136, GFR >60  01/29/2018 Creatinine 0.99, BUN 12, Potassium 3.8, Sodium 137, GFR 56->60  A complete set of results can be found in Results Review.   Recommendations: Unable to reach.  Follow-up plan: ICM clinic phone appointment on 08/22/2018.    Copy of ICM check sent to Dr. Sallyanne Kuster.    3 month ICM trend: 07/15/2018    1 Year ICM trend:       [name], RN 07/19/2018 10:28 AM

## 2018-08-02 ENCOUNTER — Ambulatory Visit (INDEPENDENT_AMBULATORY_CARE_PROVIDER_SITE_OTHER): Payer: Medicare Other | Admitting: *Deleted

## 2018-08-02 DIAGNOSIS — I5042 Chronic combined systolic (congestive) and diastolic (congestive) heart failure: Secondary | ICD-10-CM

## 2018-08-02 DIAGNOSIS — I428 Other cardiomyopathies: Secondary | ICD-10-CM

## 2018-08-02 LAB — CUP PACEART REMOTE DEVICE CHECK
Battery Remaining Longevity: 116 mo
Battery Voltage: 3.03 V
Brady Statistic AP VP Percent: 0.04 %
Brady Statistic AP VS Percent: 93.92 %
Brady Statistic AS VP Percent: 0 %
Brady Statistic AS VS Percent: 6.04 %
Brady Statistic RA Percent Paced: 93.06 %
Brady Statistic RV Percent Paced: 0.04 %
Date Time Interrogation Session: 20200714052303
HighPow Impedance: 42 Ohm
HighPow Impedance: 56 Ohm
Implantable Lead Implant Date: 20120702
Implantable Lead Implant Date: 20120702
Implantable Lead Location: 753859
Implantable Lead Location: 753860
Implantable Lead Model: 5076
Implantable Lead Model: 6947
Implantable Pulse Generator Implant Date: 20191011
Lead Channel Impedance Value: 342 Ohm
Lead Channel Impedance Value: 437 Ohm
Lead Channel Impedance Value: 456 Ohm
Lead Channel Pacing Threshold Amplitude: 0.75 V
Lead Channel Pacing Threshold Amplitude: 0.75 V
Lead Channel Pacing Threshold Pulse Width: 0.4 ms
Lead Channel Pacing Threshold Pulse Width: 0.4 ms
Lead Channel Sensing Intrinsic Amplitude: 2.5 mV
Lead Channel Sensing Intrinsic Amplitude: 2.5 mV
Lead Channel Sensing Intrinsic Amplitude: 9.75 mV
Lead Channel Sensing Intrinsic Amplitude: 9.75 mV
Lead Channel Setting Pacing Amplitude: 1.5 V
Lead Channel Setting Pacing Amplitude: 2 V
Lead Channel Setting Pacing Pulse Width: 0.4 ms
Lead Channel Setting Sensing Sensitivity: 0.3 mV

## 2018-08-11 ENCOUNTER — Other Ambulatory Visit: Payer: Self-pay

## 2018-08-11 MED ORDER — LISINOPRIL 5 MG PO TABS: ORAL_TABLET | ORAL | 6 refills | Status: DC

## 2018-08-16 NOTE — Progress Notes (Signed)
Remote ICD transmission.   

## 2018-08-22 ENCOUNTER — Encounter: Payer: Self-pay | Admitting: Cardiology

## 2018-08-22 ENCOUNTER — Ambulatory Visit (INDEPENDENT_AMBULATORY_CARE_PROVIDER_SITE_OTHER): Payer: Medicare Other | Admitting: Cardiology

## 2018-08-22 ENCOUNTER — Other Ambulatory Visit: Payer: Self-pay

## 2018-08-22 ENCOUNTER — Ambulatory Visit (INDEPENDENT_AMBULATORY_CARE_PROVIDER_SITE_OTHER): Payer: Medicare Other

## 2018-08-22 VITALS — BP 130/60 | HR 65 | Temp 95.5°F | Ht 67.0 in | Wt 204.0 lb

## 2018-08-22 DIAGNOSIS — Z95810 Presence of automatic (implantable) cardiac defibrillator: Secondary | ICD-10-CM | POA: Diagnosis not present

## 2018-08-22 DIAGNOSIS — I480 Paroxysmal atrial fibrillation: Secondary | ICD-10-CM

## 2018-08-22 DIAGNOSIS — I428 Other cardiomyopathies: Secondary | ICD-10-CM | POA: Diagnosis not present

## 2018-08-22 DIAGNOSIS — I10 Essential (primary) hypertension: Secondary | ICD-10-CM | POA: Diagnosis not present

## 2018-08-22 DIAGNOSIS — E118 Type 2 diabetes mellitus with unspecified complications: Secondary | ICD-10-CM

## 2018-08-22 DIAGNOSIS — I5042 Chronic combined systolic (congestive) and diastolic (congestive) heart failure: Secondary | ICD-10-CM | POA: Diagnosis not present

## 2018-08-22 DIAGNOSIS — I4901 Ventricular fibrillation: Secondary | ICD-10-CM | POA: Diagnosis not present

## 2018-08-22 DIAGNOSIS — Z794 Long term (current) use of insulin: Secondary | ICD-10-CM

## 2018-08-22 MED ORDER — ASPIRIN EC 81 MG PO TBEC: 81.0000 mg | DELAYED_RELEASE_TABLET | Freq: Every day | ORAL | 3 refills | Status: AC

## 2018-08-22 NOTE — Patient Instructions (Signed)
Medication Instructions:  DECREASE Aspirin 81mg  Take 1 tablet once a day  If you need a refill on your cardiac medications before your next appointment, please call your pharmacy.   Lab work: None  If you have labs (blood work) drawn today and your tests are completely normal, you will receive your results only by: Marland Kitchen MyChart Message (if you have MyChart) OR . A paper copy in the mail If you have any lab test that is abnormal or we need to change your treatment, we will call you to review the results.  Testing/Procedures: None   Follow-Up: At [facility], you and your health needs are our priority.  As part of our continuing mission to provide you with exceptional heart care, we have created designated Provider Care Teams.  These Care Teams include your primary Cardiologist (physician) and Advanced Practice Providers (APPs -  Physician Assistants and Nurse Practitioners) who all work together to provide you with the care you need, when you need it. You will need a follow up appointment in 6 months.  Please call our office 2 months in advance to schedule this appointment.  You may see DR Holland Falling or one of the following Advanced Practice Providers on your designated Care Team: Almyra Deforest, [place] . Fabian Sharp, PA-C  Any Other Special Instructions Will Be Listed Below (If Applicable).

## 2018-08-22 NOTE — Assessment & Plan Note (Signed)
Stable- Optivol reading showed normal impedence today

## 2018-08-22 NOTE — Progress Notes (Signed)
Cardiology Office Note:    Date:  08/22/2018   ID:  [name], [name] [birth year], MRN [id number]  PCP:  Jake Samples, PA-C  Cardiologist:  Sanda Klein, MD  Electrophysiologist:  None   Referring MD: Jake Samples, PA*   Chief Complaint  Patient presents with  . Follow-up  . Chest Pain    History of Present Illness:    Diana Campbell is a 73 y.o. female with a hx of remote VF arrest in June 2005.  She had an ICD implanted.  She had a whole system revision in July 2012 and a generator change out in October 2019.  Her EF at the time of her VF arrest was 15%, she had minor coronary disease.  Her most recent ejection fraction was in March 2019 which was 40 to 45%.  Other problems include insulin-dependent diabetes, her renal function is normal.  She is in the office today for routine check.  Optivol readings today showed normal impedance.  She denies any orthopnea.  She has occasional lower extremity edema, usually at the end of the day.  She admits she has been under significant stress, she is caring for her 31-year-old great grandson at home.  She says her diet has not been good.  Past Medical History:  Diagnosis Date  . Arthritis   . Automatic implantable cardioverter-defibrillator in situ 07/2010  . Cardiac abnormality    with defibrilator  . Chronic combined systolic and diastolic CHF (congestive heart failure) ([place]) 09/04/2015  . Diabetes mellitus   . H/O cardiac arrest 2006  . Hypercholesteremia   . Hypertension   . Neuropathy     Past Surgical History:  Procedure Laterality Date  . 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  Marland Kitchen CARDIAC CATHETERIZATION  04/14/2000   Normal coronary arteries.  Marland Kitchen CARDIAC DEFIBRILLATOR PLACEMENT  07/21/2010   Medtronic Protecta XT DR, model#[id number], serial#[id number]  .  CARDIOVASCULAR STRESS TEST  04/05/2000   Scintigraphic evidence of mild LV dilatation and global LV dysfunction with an EF 41% with mild inferolateral ischemia.  Marland Kitchen CARPAL TUNNEL RELEASE Right 01/23/2016   Procedure: CARPAL TUNNEL RELEASE;  Surgeon: Carole Civil, MD;  Location: [facility];  Service: Orthopedics;  Laterality: Right;  pt knows to arrive at 6:15  . COLONOSCOPY  03/30/2011   Procedure: COLONOSCOPY;  Surgeon: Jamesetta So, MD;  Location: [facility];  Service: Gastroenterology;  Laterality: N/A;  . DORSAL COMPARTMENT RELEASE Right 08/11/2013   Procedure: RELEASE DORSAL COMPARTMENT (DEQUERVAIN);  Surgeon: Carole Civil, MD;  Location: [facility];  Service: Orthopedics;  Laterality: Right;  . ICD GENERATOR CHANGEOUT N/A 10/29/2017   Procedure: ICD GENERATOR CHANGEOUT;  Surgeon: Sanda Klein, MD;  Location: [facility];  Service: Cardiovascular;  Laterality: N/A;  . TRANSTHORACIC ECHOCARDIOGRAM  08/03/2011   EF 45-50%, mildly reduced LV systolic function.    Current Medications: Current Meds  Medication Sig  . ACCU-CHEK AVIVA PLUS test strip USE TO TEST BLOOD SUGAR 4 TIMES A DAY (DX: E11.65)  . ACCU-CHEK FASTCLIX LANCETS MISC 1 each by Does not apply route 2 (two) times daily.  Marland Kitchen acetaminophen (TYLENOL) 325 MG tablet Take 650 mg by mouth 3 (three) times daily.   Marland Kitchen atorvastatin (LIPITOR) 10 MG tablet Take 10 mg by mouth at bedtime.   . carvedilol (COREG) 12.5 MG tablet TAKE 1/2 TABLET BY  MOUTH TWICE A DAY WITH A MEAL (Patient taking differently: Take 6.25 mg by mouth 2 (two) times daily. )  . Cholecalciferol (VITAMIN D PO) Take 1 tablet by mouth daily.  Marland Kitchen HUMALOG MIX 75/25 KWIKPEN (75-25) 100 UNIT/ML Kwikpen INJECT 40 UNITS INTO THE SKIN TWICE A DAY (Patient taking differently: 30 Units. )  . Lancets Misc. (ACCU-CHEK FASTCLIX LANCET) KIT 1 each by Does not apply route 2 (two) times daily.  Marland Kitchen lisinopril (ZESTRIL) 5 MG tablet Take 1 tablet 5 mg in morning and 1/2 tablet 2.5  mg in afternoon  . traMADol (ULTRAM) 50 MG tablet Take 50 mg by mouth every 6 (six) hours as needed.  . [DISCONTINUED] aspirin 325 MG EC tablet Take 81 mg by mouth daily.     Allergies:   Patient has no known allergies.   Social History   Socioeconomic History  . Marital status: Married    Spouse name: Not on file  . Number of children: Not on file  . Years of education: Not on file  . Highest education level: Not on file  Occupational History  . Not on file  Social Needs  . Financial resource strain: Not on file  . Food insecurity    Worry: Not on file    Inability: Not on file  . Transportation needs    Medical: Not on file    Non-medical: Not on file  Tobacco Use  . Smoking status: Never Smoker  . Smokeless tobacco: Current User    Types: Snuff  . Tobacco comment: 3 dips per day  Substance and Sexual Activity  . Alcohol use: No  . Drug use: No  . Sexual activity: Yes    Birth control/protection: None  Lifestyle  . Physical activity    Days per week: Not on file    Minutes per session: Not on file  . Stress: Not on file  Relationships  . Social Herbalist on phone: Not on file    Gets together: Not on file    Attends religious service: Not on file    Active member of club or organization: Not on file    Attends meetings of clubs or organizations: Not on file    Relationship status: Not on file  Other Topics Concern  . Not on file  Social History Narrative  . Not on file     Family History: The patient's family history is not on file.  ROS:   Please see the history of present illness.     All other systems reviewed and are negative.  EKGs/Labs/Other Studies Reviewed:    The following studies were reviewed today: Echo March 2019  EKG:  EKG is ordered today.  The ekg ordered today demonstrates A pace  Recent Labs: 01/29/2018: TSH 1.683 01/31/2018: Hemoglobin 11.5; Platelets 310 06/20/2018: ALT 9; BUN 18; Creat 1.02; Potassium 4.6; Sodium 143   Recent Lipid Panel    Component Value Date/Time   CHOL 164 06/20/2018 0900   TRIG 94 06/20/2018 0900   HDL 64 06/20/2018 0900   CHOLHDL 2.6 06/20/2018 0900   VLDL 14 09/26/2015 0856   LDLCALC 81 06/20/2018 0900    Physical Exam:    VS:  BP 130/60 (BP Location: Left Arm, Patient Position: Sitting, Cuff Size: Normal)   Pulse 65   Temp (!) 95.5 F (35.3 C)   Ht 5' 7"  (1.702 m)   Wt 204 lb (92.5 kg)   BMI 31.95 kg/m  Wt Readings from Last 3 Encounters:  08/22/18 204 lb (92.5 kg)  06/28/18 202 lb (91.6 kg)  02/18/18 197 lb (89.4 kg)     GEN: Overweight AA female, well developed in no acute distress HEENT: Normal NECK: No JVD; No carotid bruits LYMPHATICS: No lymphadenopathy CARDIAC: RRR, no murmurs, rubs, gallops RESPIRATORY:  Clear to auscultation without rales, wheezing or rhonchi  ABDOMEN: Soft, non-tender, non-distended MUSCULOSKELETAL:  trace edema; No deformity  SKIN: Warm and dry NEUROLOGIC:  Alert and oriented x 3 PSYCHIATRIC:  Normal affect   ASSESSMENT:    Chronic combined systolic and diastolic CHF (congestive heart failure) (H[facility]) Stable- Optivol reading showed normal impedence today  Cardiomyopathy, nonischemic ([facility]) Last EF 40-45% March 2019  ICD (implantable cardioverter-defibrillator) in place ICD implanted 2005, complete system change 2012, Gen change Oct 2019  VF (ventricular fibrillation) [facility]) History of VF June 2005  Type 2 diabetes mellitus with complication, with long-term current use of insulin ([place]) Followed by PCP- she says her diet has been poor  PAF (paroxysmal atrial fibrillation) ([place]) One brief episode recorded by ICD- no AC per Dr Sallyanne Kuster  PLAN:    Decrease ASA to 81 mg daily.  She needs a tooth pulled and I told her that would not be a problem from our standpoint. F/U Dr Sallyanne Kuster in 6 months.    Medication Adjustments/Labs and Tests Ordered: Current medicines are reviewed at length with the patient today.  Concerns  regarding medicines are outlined above.  Orders Placed This Encounter  Procedures  . EKG 12-Lead   Meds ordered this encounter  Medications  . aspirin EC 81 MG tablet    Sig: Take 1 tablet (81 mg total) by mouth daily.    Dispense:  90 tablet    Refill:  3    Patient Instructions  Medication Instructions:  DECREASE Aspirin 30m Take 1 tablet once a day  If you need a refill on your cardiac medications before your next appointment, please call your pharmacy.   Lab work: None  If you have labs (blood work) drawn today and your tests are completely normal, you will receive your results only by: .Marland KitchenMyChart Message (if you have MyChart) OR . A paper copy in the mail If you have any lab test that is abnormal or we need to change your treatment, we will call you to review the results.  Testing/Procedures: None   Follow-Up: At [facility] you and your health needs are our priority.  As part of our continuing mission to provide you with exceptional heart care, we have created designated Provider Care Teams.  These Care Teams include your primary Cardiologist (physician) and Advanced Practice Providers (APPs -  Physician Assistants and Nurse Practitioners) who all work together to provide you with the care you need, when you need it. You will need a follow up appointment in 6 months.  Please call our office 2 months in advance to schedule this appointment.  You may see DR MHolland Fallingor one of the following Advanced Practice Providers on your designated Care Team: HAlmyra Deforest [place]. AFabian Sharp PA-C  Any Other Special Instructions Will Be Listed Below (If Applicable).      SAngelena Form PA-C  08/22/2018 10:29 AM    [facility]

## 2018-08-22 NOTE — Progress Notes (Signed)
Thanks

## 2018-08-22 NOTE — Assessment & Plan Note (Signed)
Last EF 40-45% March 2019

## 2018-08-22 NOTE — Assessment & Plan Note (Signed)
ICD implanted 2005, complete system change 2012, Gen change Oct 2019

## 2018-08-22 NOTE — Progress Notes (Signed)
Thank you :)

## 2018-08-22 NOTE — Assessment & Plan Note (Signed)
One brief episode recorded by ICD- no AC per Dr Sallyanne Kuster

## 2018-08-22 NOTE — Assessment & Plan Note (Signed)
History of VF June 2005

## 2018-08-22 NOTE — Assessment & Plan Note (Signed)
Followed by PCP- she says her diet has been poor

## 2018-08-22 NOTE — Progress Notes (Signed)
EPIC Encounter for ICM Monitoring  Patient Name: Diana Campbell is a 73 y.o. female Date: 08/22/2018 Primary Care Physican: Scherrie Bateman Primary Cardiologist:Kelly Electrophysiologist:Croitoru 01/22/2020Weight: 199 lbs (in office)   Transmission reviewed. Attempted call to patient and left detailed message.   Optivol thoracic impedance normal.   Prescribed:No diuretic  Labs: 06/20/2018 Creatinine 1.02, BUN 18, Potassium 4.6, Sodium 143, GFR 54-63 02/08/2018 Creatinine0.77, BUN13, Potassium4.1, Sodium144, TDD22-02 A complete set of results can be found in Results Review.  Recommendations:Unable to reach.  Follow-up plan: ICM clinic phone appointment on9/04/2018.   Copy of ICM check sent to Dr.Croitoru and Kerin Ransom, PA since patient has OV with him today.     3 month ICM trend: 08/22/2018    1 Year ICM trend:       [name], RN 08/22/2018 7:59 AM

## 2018-08-23 ENCOUNTER — Telehealth: Payer: Self-pay

## 2018-08-23 NOTE — Telephone Encounter (Signed)
Remote ICM transmission received.  Attempted call to patient regarding ICM remote transmission and left detailed message, per DPR, with next ICM remote transmission date of 09/23/2018.  Advised to return call for any fluid symptoms or questions.

## 2018-09-15 ENCOUNTER — Other Ambulatory Visit: Payer: Self-pay | Admitting: Family Medicine

## 2018-09-15 DIAGNOSIS — Z1231 Encounter for screening mammogram for malignant neoplasm of breast: Secondary | ICD-10-CM

## 2018-09-23 ENCOUNTER — Ambulatory Visit (INDEPENDENT_AMBULATORY_CARE_PROVIDER_SITE_OTHER): Payer: Medicare Other

## 2018-09-23 DIAGNOSIS — I5042 Chronic combined systolic (congestive) and diastolic (congestive) heart failure: Secondary | ICD-10-CM

## 2018-09-23 DIAGNOSIS — Z95810 Presence of automatic (implantable) cardiac defibrillator: Secondary | ICD-10-CM

## 2018-09-23 NOTE — Progress Notes (Signed)
EPIC Encounter for ICM Monitoring  Patient Name: Diana Campbell is a 73 y.o. female Date: 09/23/2018 Primary Care Physican: Scherrie Bateman Primary Cardiologist:Kelly Electrophysiologist:Croitoru 08/03/2020Weight: 204 lbs (in office)   Transmission reviewed.  Optivol thoracic impedance normal.   Prescribed:No diuretic  Labs: 06/20/2018 Creatinine 1.02, BUN 18, Potassium 4.6, Sodium 143, GFR 54-63 02/08/2018 Creatinine0.77, BUN13, Potassium4.1, Sodium144, VEH20-94 A complete set of results can be found in Results Review.  Recommendations: None  Follow-up plan: ICM clinic phone appointment on 11/03/2018. 91 day device clinic remote transmission 11/02/2018.    Copy of ICM check sent to Dr. Sallyanne Kuster.   3 month ICM trend: 09/23/2018    1 Year ICM trend:       [name], RN 09/23/2018 4:40 PM

## 2018-09-25 NOTE — Progress Notes (Signed)
Looks great, thanks

## 2018-10-17 ENCOUNTER — Other Ambulatory Visit: Payer: Self-pay | Admitting: Cardiovascular Disease

## 2018-11-01 ENCOUNTER — Ambulatory Visit
Admission: RE | Admit: 2018-11-01 | Discharge: 2018-11-01 | Disposition: A | Discharge status: Home / Self Care | Payer: Medicare Other | Source: Ambulatory Visit | Attending: Family Medicine | Admitting: Family Medicine

## 2018-11-01 ENCOUNTER — Other Ambulatory Visit: Payer: Self-pay

## 2018-11-01 DIAGNOSIS — Z1231 Encounter for screening mammogram for malignant neoplasm of breast: Secondary | ICD-10-CM

## 2018-11-02 ENCOUNTER — Ambulatory Visit (INDEPENDENT_AMBULATORY_CARE_PROVIDER_SITE_OTHER): Payer: Medicare Other | Admitting: *Deleted

## 2018-11-02 DIAGNOSIS — I4901 Ventricular fibrillation: Secondary | ICD-10-CM | POA: Diagnosis not present

## 2018-11-02 DIAGNOSIS — I5042 Chronic combined systolic (congestive) and diastolic (congestive) heart failure: Secondary | ICD-10-CM

## 2018-11-02 LAB — CUP PACEART REMOTE DEVICE CHECK
Battery Remaining Longevity: 112 mo
Battery Voltage: 3.02 V
Brady Statistic AP VP Percent: 0.05 %
Brady Statistic AP VS Percent: 93.19 %
Brady Statistic AS VP Percent: 0 %
Brady Statistic AS VS Percent: 6.76 %
Brady Statistic RA Percent Paced: 92.45 %
Brady Statistic RV Percent Paced: 0.05 %
Date Time Interrogation Session: 20201014084223
HighPow Impedance: 42 Ohm
HighPow Impedance: 48 Ohm
Implantable Lead Implant Date: 20120702
Implantable Lead Implant Date: 20120702
Implantable Lead Location: 753859
Implantable Lead Location: 753860
Implantable Lead Model: 5076
Implantable Lead Model: 6947
Implantable Pulse Generator Implant Date: 20191011
Lead Channel Impedance Value: 380 Ohm
Lead Channel Impedance Value: 456 Ohm
Lead Channel Impedance Value: 456 Ohm
Lead Channel Pacing Threshold Amplitude: 0.625 V
Lead Channel Pacing Threshold Amplitude: 0.75 V
Lead Channel Pacing Threshold Pulse Width: 0.4 ms
Lead Channel Pacing Threshold Pulse Width: 0.4 ms
Lead Channel Sensing Intrinsic Amplitude: 10.5 mV
Lead Channel Sensing Intrinsic Amplitude: 10.5 mV
Lead Channel Sensing Intrinsic Amplitude: 2.25 mV
Lead Channel Sensing Intrinsic Amplitude: 2.25 mV
Lead Channel Setting Pacing Amplitude: 1.5 V
Lead Channel Setting Pacing Amplitude: 2 V
Lead Channel Setting Pacing Pulse Width: 0.4 ms
Lead Channel Setting Sensing Sensitivity: 0.3 mV

## 2018-11-04 ENCOUNTER — Ambulatory Visit (INDEPENDENT_AMBULATORY_CARE_PROVIDER_SITE_OTHER): Payer: Medicare Other

## 2018-11-04 DIAGNOSIS — Z95810 Presence of automatic (implantable) cardiac defibrillator: Secondary | ICD-10-CM

## 2018-11-04 DIAGNOSIS — I5042 Chronic combined systolic (congestive) and diastolic (congestive) heart failure: Secondary | ICD-10-CM

## 2018-11-06 IMAGING — DX DG KNEE COMPLETE 4+V*L*
4 series · 4 of 4 positions shown · non-contrast
Comparison: None.

CLINICAL DATA: Patient fell on 21 September, 2017 and has persistent
pain.

EXAM:
LEFT KNEE - COMPLETE 4+ VIEW

[knee ap]
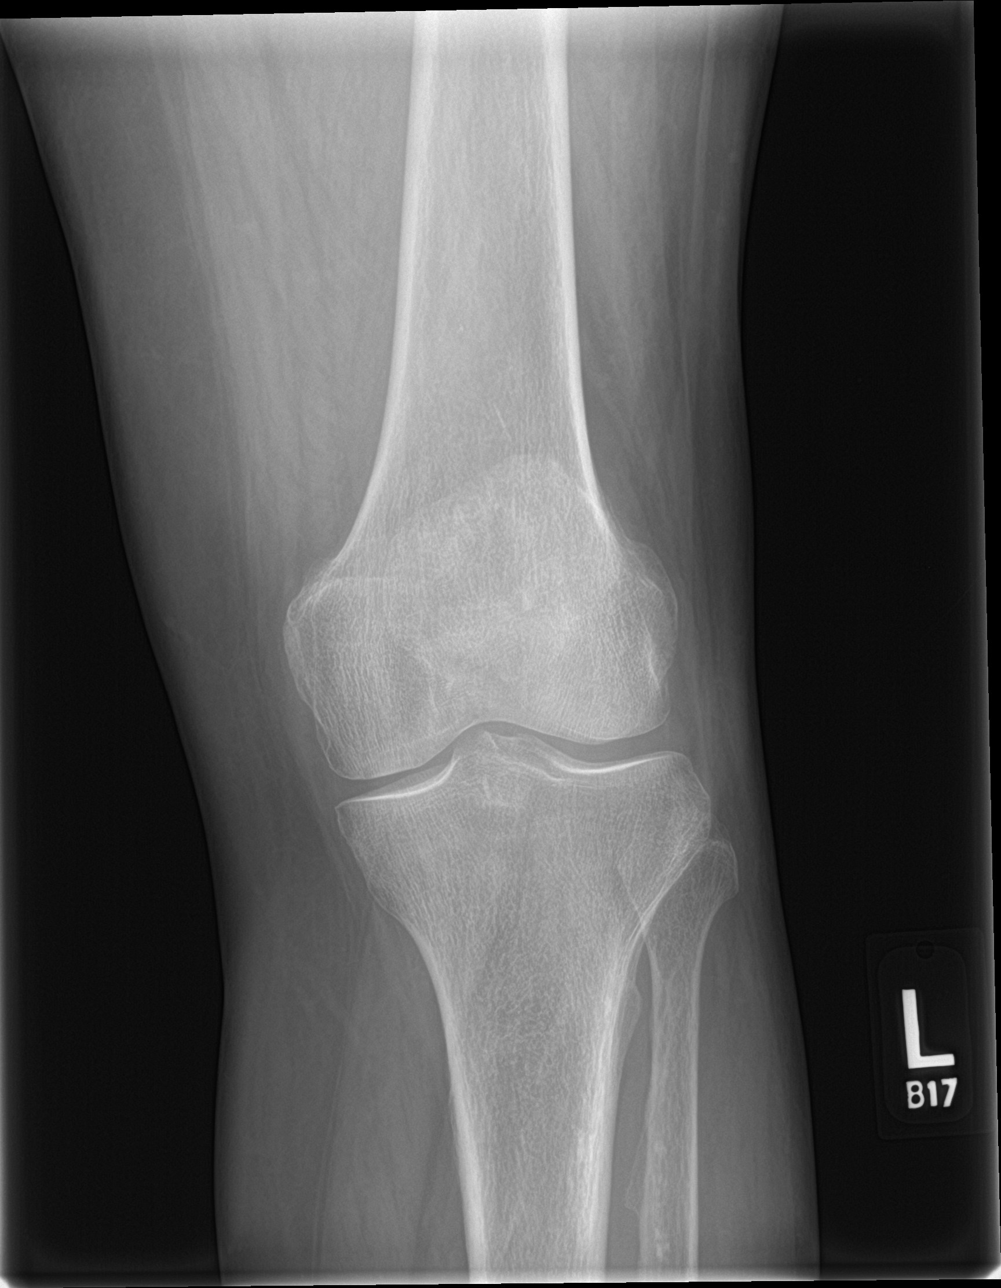

[tunnel]
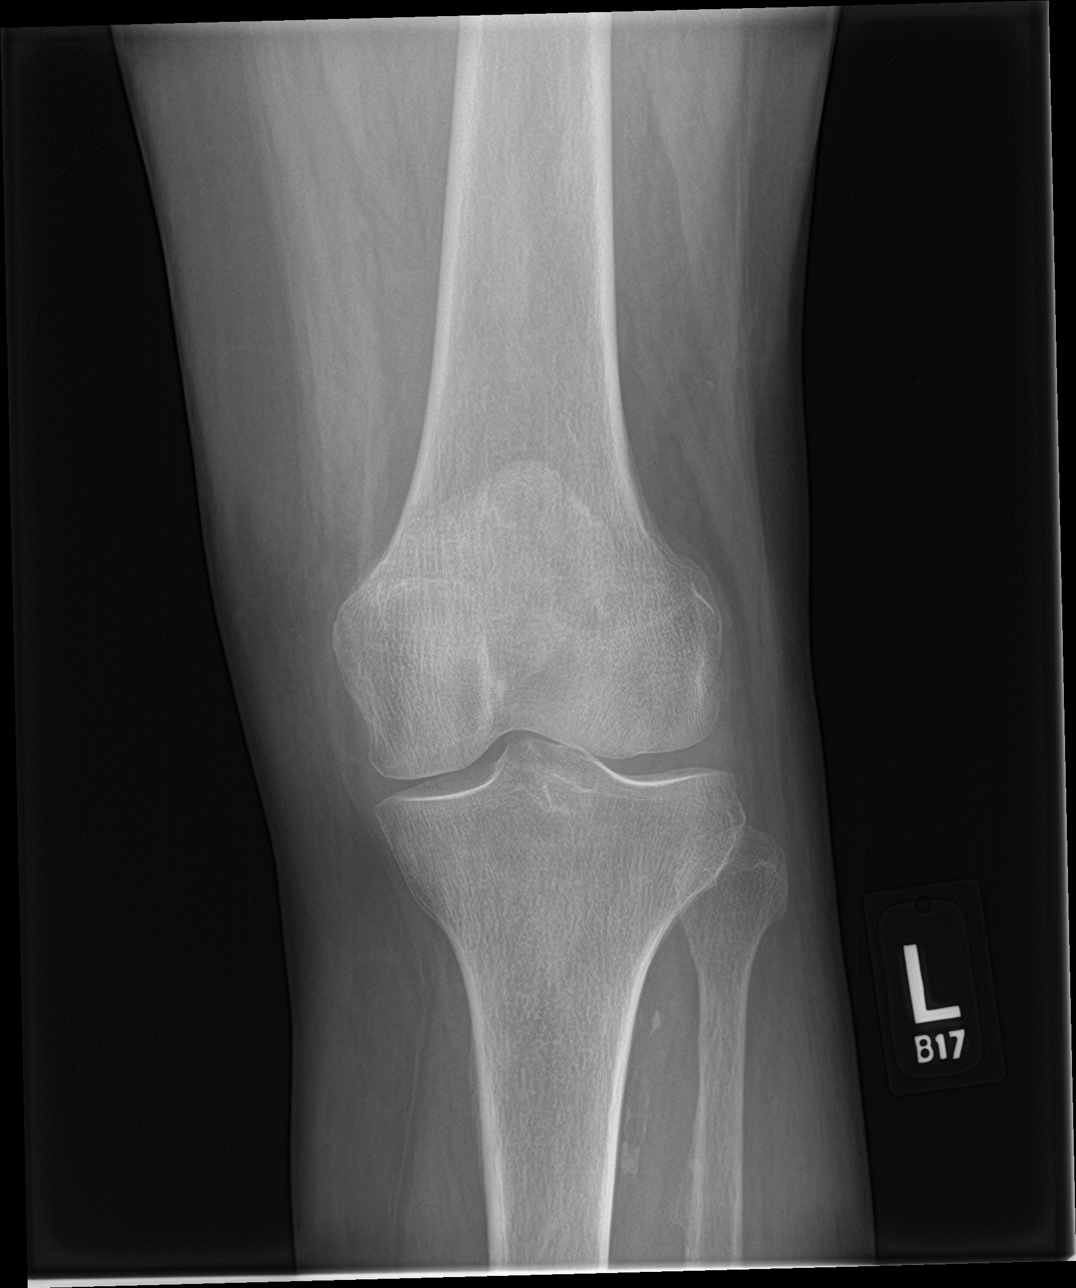

[knee lat]
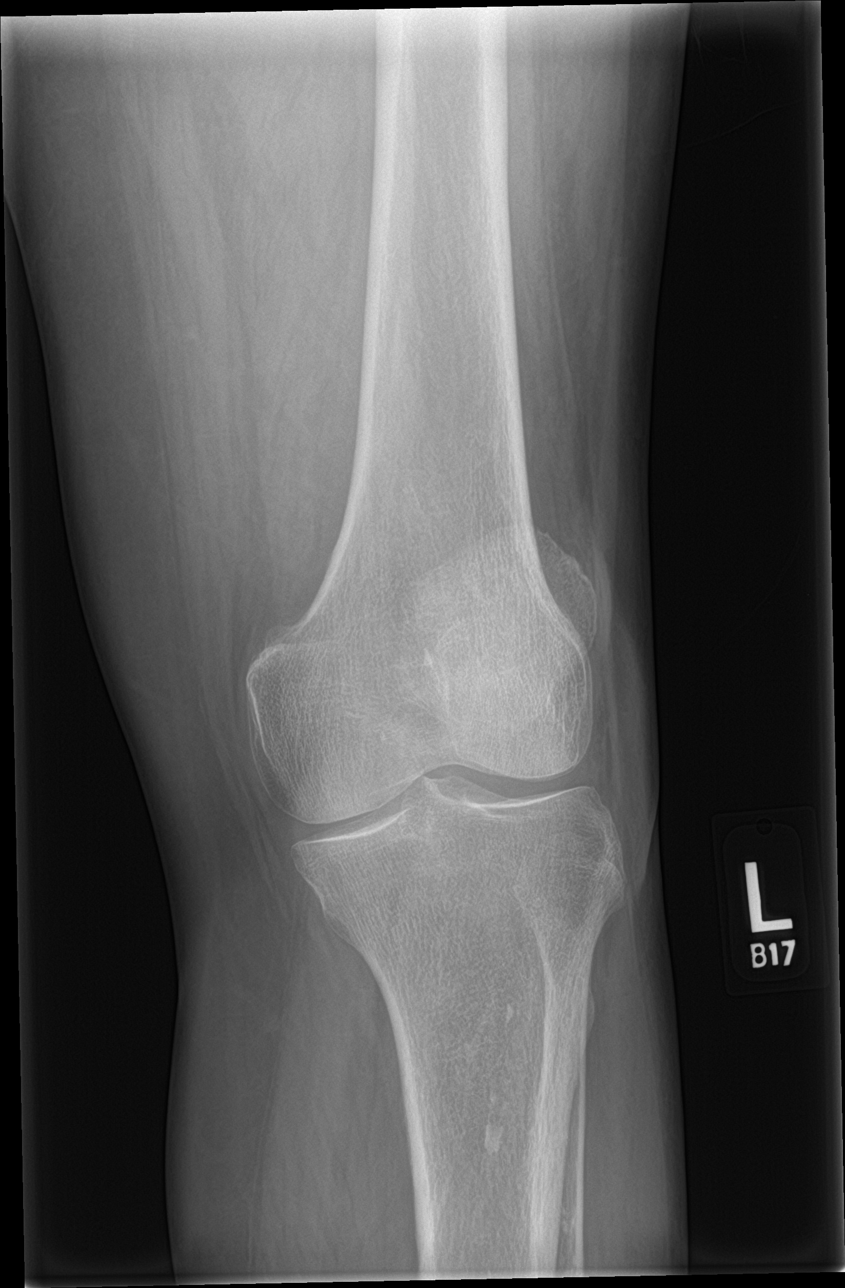

[knee sunrise]
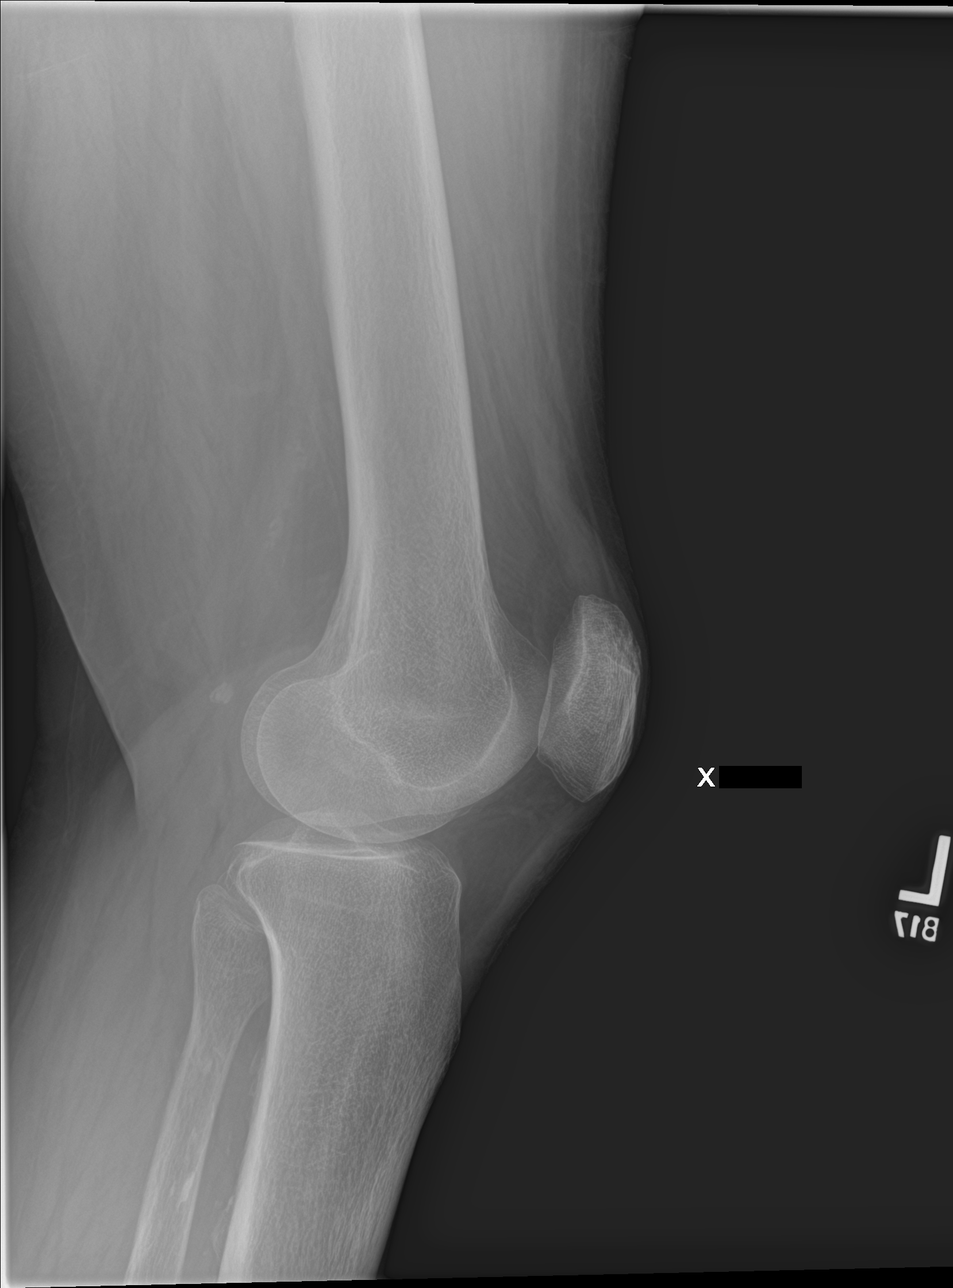

[4 of 4 positions shown; findings below may reference images not displayed]

FINDINGS: The bones are subjectively adequately mineralized. The joint spaces
are well maintained. There is no acute fracture or dislocation.
There may be a small suprapatellar effusion.
IMPRESSION: There is no acute or significant chronic bony abnormality of the
left knee. Possible small suprapatellar effusion.

## 2018-11-06 IMAGING — DX DG HIP (WITH OR WITHOUT PELVIS) 2-3V*L*
3 series · 3 of 3 positions shown · non-contrast
Comparison: None.

CLINICAL DATA: Onset of left hip pain 3 days ago. Patient fell on
September 21, 2017.

EXAM:
DG HIP (WITH OR WITHOUT PELVIS) 2-3V LEFT

[pelvis ap]
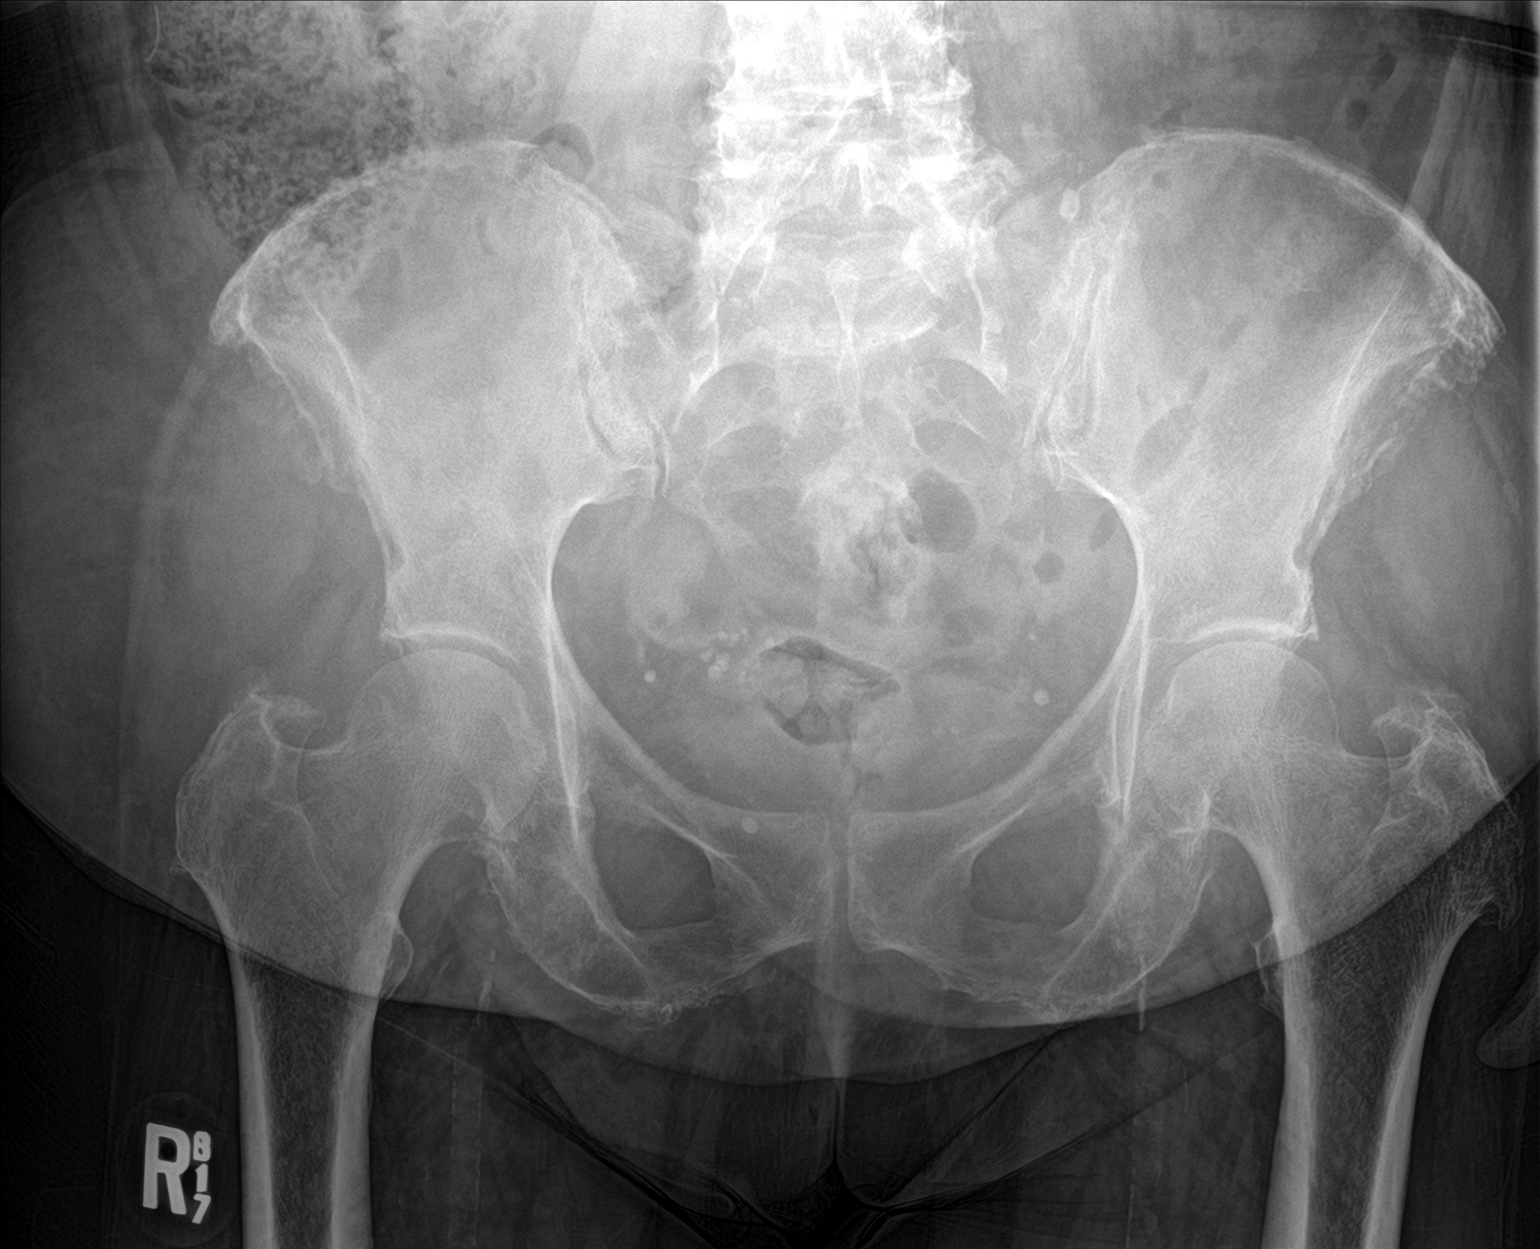

[hip ap]
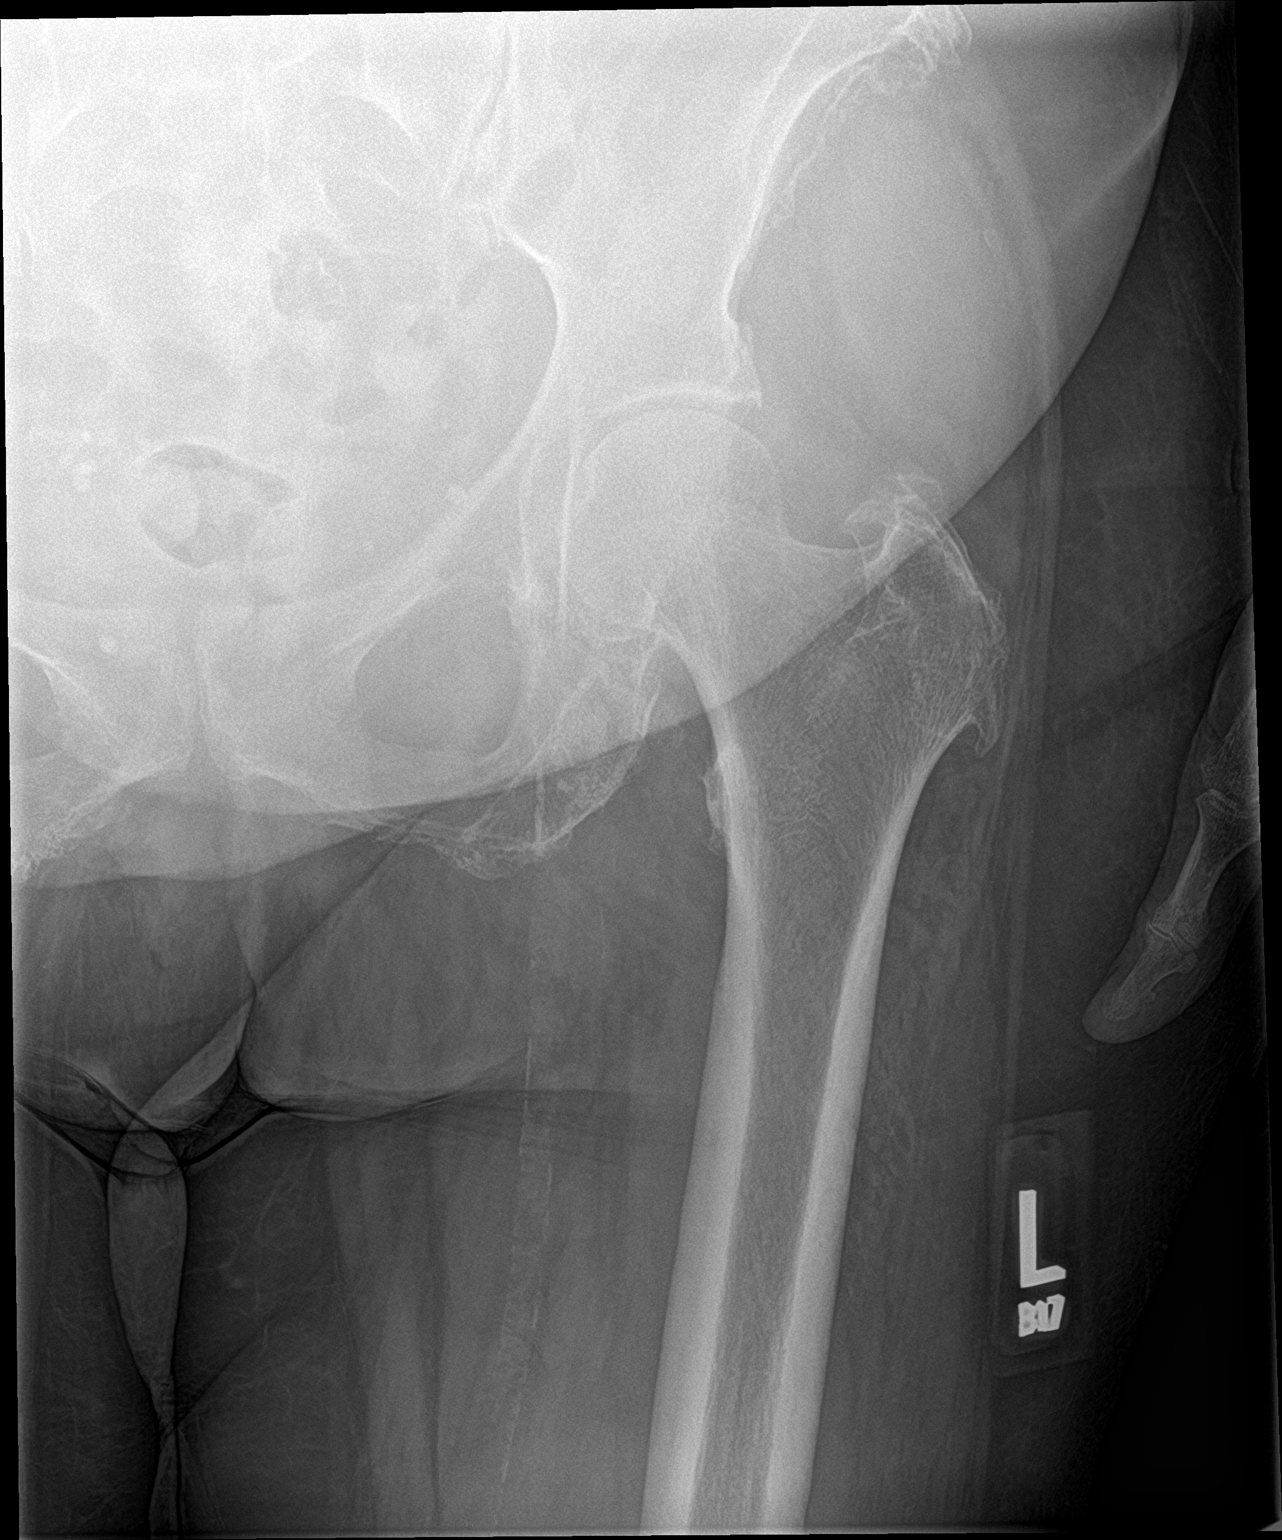

[hip lat]
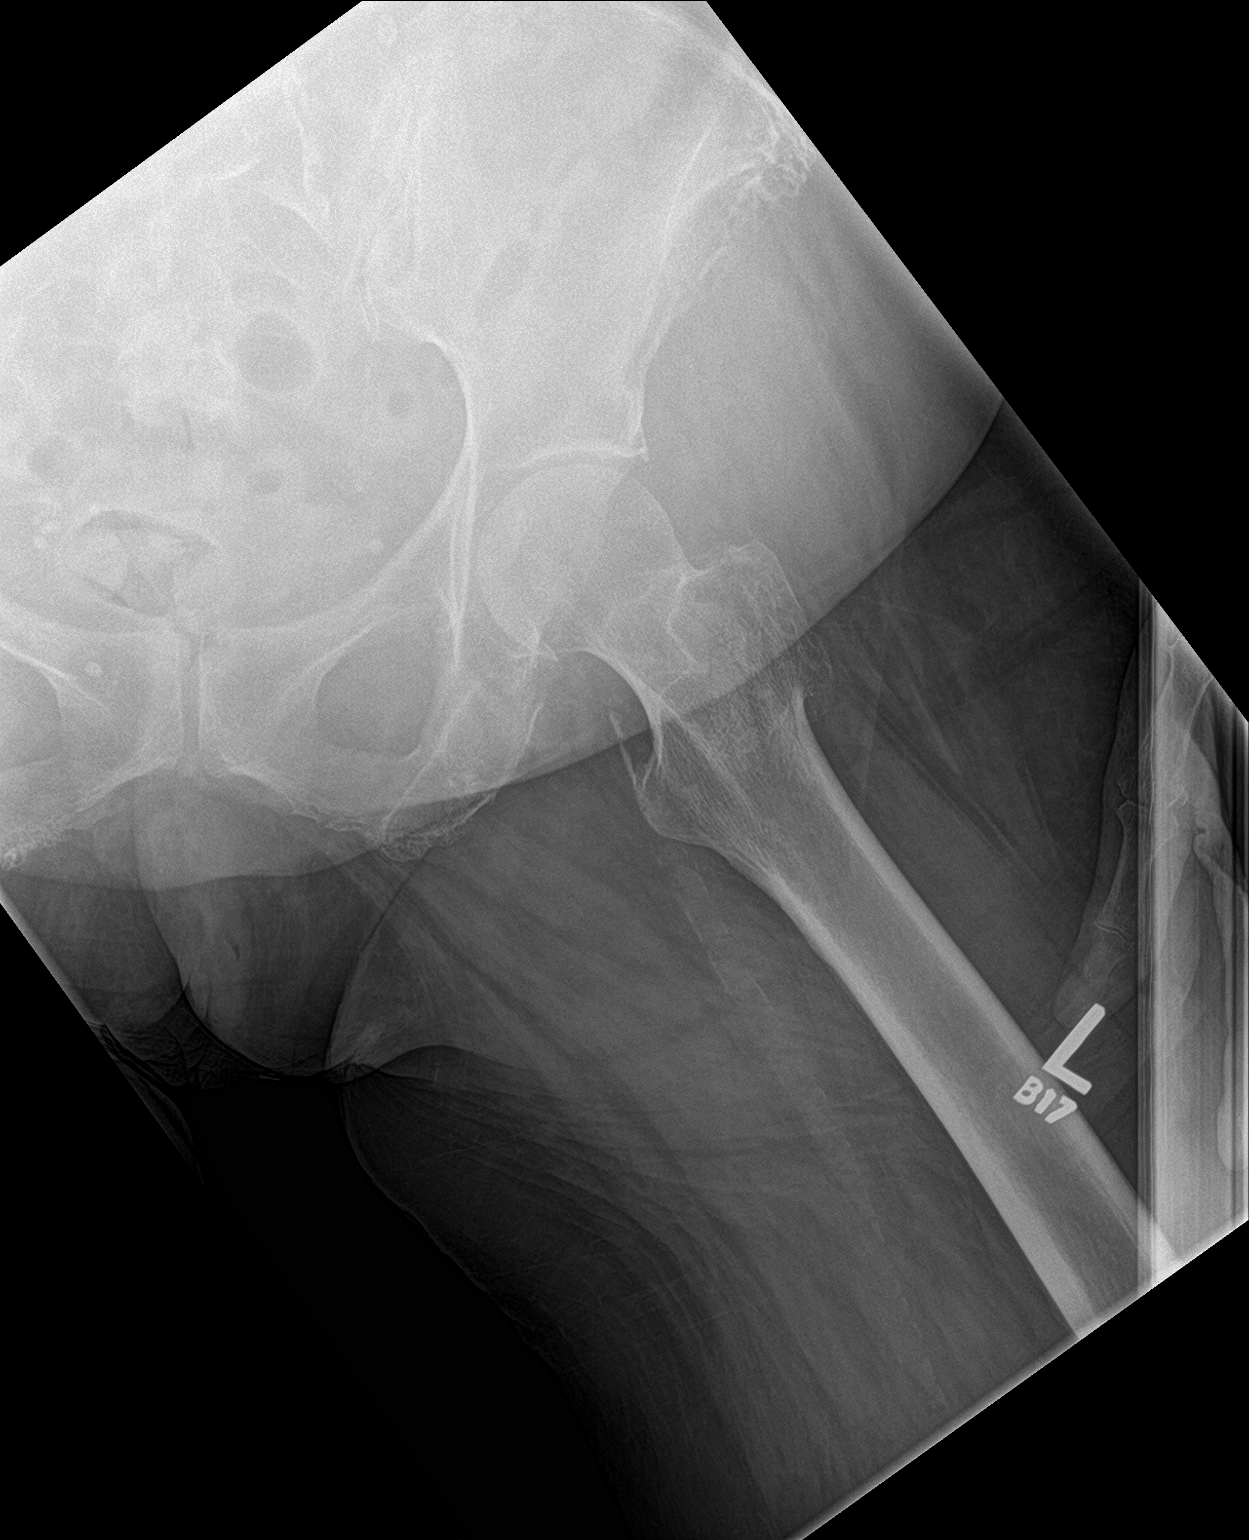

[3 of 3 positions shown; findings below may reference images not displayed]

FINDINGS: The bony pelvis is subjectively adequately mineralized. No acute or
healing pelvic fracture is observed. AP and lateral views of the
left hip reveal preservation of the joint space. The articular
surfaces of the left femoral head and acetabulum remains smoothly
rounded. The femoral neck, intertrochanteric, and subtrochanteric
regions are normal.
IMPRESSION: There is no acute fracture nor dislocation of the left hip. The
observed portions of the bony pelvis are normal.

## 2018-11-07 ENCOUNTER — Telehealth: Payer: Self-pay

## 2018-11-07 NOTE — Progress Notes (Signed)
Ty!

## 2018-11-07 NOTE — Progress Notes (Signed)
EPIC Encounter for ICM Monitoring  Patient Name: Diana Campbell is a 73 y.o. female Date: 11/07/2018 Primary Care Physican: Jake Samples, PA-C Primary Cardiologist:Kelly Electrophysiologist:Croitoru 08/03/2020Weight: 204 lbs (in office)   Attempted call to patient and unable to reach.  Left detailed message per DPR regarding transmission. Transmission reviewed.   Optivol thoracic impedance normal.   Prescribed:No diuretic  Labs: 06/20/2018 Creatinine 1.02, BUN 18, Potassium 4.6, Sodium 143, GFR 54-63 02/08/2018 Creatinine0.77, BUN13, Potassium4.1, Sodium144, ITG54-98 A complete set of results can be found in Results Review.  Recommendations: None  Recommendations: Left voice mail with ICM number and encouraged to call if experiencing any fluid symptoms.  Follow-up plan: ICM clinic phone appointment on 12/06/2018.   91 day device clinic remote transmission 02/01/2019.   Copy of ICM check sent to Dr. Sallyanne Kuster.   3 month ICM trend: 11/02/2018    1 Year ICM trend:       [name], RN 11/07/2018 11:04 AM

## 2018-11-07 NOTE — Telephone Encounter (Signed)
Remote ICM transmission received.  Attempted call to patient regarding ICM remote transmission and left detailed message per DPR.  Advised to return call for any fluid symptoms or questions.  

## 2018-11-14 NOTE — Progress Notes (Signed)
Remote ICD transmission.   

## 2018-12-01 ENCOUNTER — Other Ambulatory Visit: Payer: Self-pay | Admitting: "Endocrinology

## 2018-12-06 ENCOUNTER — Ambulatory Visit (INDEPENDENT_AMBULATORY_CARE_PROVIDER_SITE_OTHER): Payer: Medicare Other

## 2018-12-06 DIAGNOSIS — I5042 Chronic combined systolic (congestive) and diastolic (congestive) heart failure: Secondary | ICD-10-CM | POA: Diagnosis not present

## 2018-12-06 DIAGNOSIS — Z95810 Presence of automatic (implantable) cardiac defibrillator: Secondary | ICD-10-CM | POA: Diagnosis not present

## 2018-12-09 NOTE — Progress Notes (Signed)
EPIC Encounter for ICM Monitoring  Patient Name: Diana Campbell is a 73 y.o. female Date: 12/09/2018 Primary Care Physican: Jake Samples, PA-C Primary Cardiologist:Kelly Electrophysiologist:Croitoru 08/03/2020Weight:204lbs (in office)   Transmission reviewed.   Optivol thoracic impedance normal.   Prescribed:No diuretic  Labs: 06/20/2018 Creatinine 1.02, BUN 18, Potassium 4.6, Sodium 143, GFR 54-63 02/08/2018 Creatinine0.77, BUN13, Potassium4.1, Sodium144, BJY78-29 A complete set of results can be found in Results Review.  Recommendations:None  Follow-up plan: ICM clinic phone appointment on 01/16/2019.   91 day device clinic remote transmission 02/01/2019.   Copy of ICM check sent to Dr. Sallyanne Kuster.   3 month ICM trend: 12/06/2018    1 Year ICM trend:       [name], RN 12/09/2018 9:52 AM

## 2018-12-28 LAB — COMPLETE METABOLIC PANEL WITH GFR
AG Ratio: 1.3 (calc) (ref 1.0–2.5)
ALT: 7 U/L (ref 6–29)
AST: 13 U/L (ref 10–35)
Albumin: 3.8 g/dL (ref 3.6–5.1)
Alkaline phosphatase (APISO): 70 U/L (ref 37–153)
BUN: 16 mg/dL (ref 7–25)
CO2: 29 mmol/L (ref 20–32)
Calcium: 9.5 mg/dL (ref 8.6–10.4)
Chloride: 106 mmol/L (ref 98–110)
Creat: 0.92 mg/dL (ref 0.60–0.93)
GFR, Est African American: 71 mL/min/{1.73_m2} (ref >=60)
GFR, Est Non African American: 61 mL/min/{1.73_m2} (ref >=60)
Globulin: 2.9 g/dL (calc) (ref 1.9–3.7)
Glucose, Bld: 169 mg/dL — ABNORMAL HIGH (ref 65–99)
Potassium: 4.3 mmol/L (ref 3.5–5.3)
Sodium: 141 mmol/L (ref 135–146)
Total Bilirubin: 0.4 mg/dL (ref 0.2–1.2)
Total Protein: 6.7 g/dL (ref 6.1–8.1)

## 2018-12-28 LAB — HEMOGLOBIN A1C
Hgb A1c MFr Bld: 7.8 % of total Hgb — ABNORMAL HIGH (ref <5.7)
Mean Plasma Glucose: 177 (calc)
eAG (mmol/L): 9.8 (calc)

## 2018-12-29 ENCOUNTER — Ambulatory Visit (INDEPENDENT_AMBULATORY_CARE_PROVIDER_SITE_OTHER): Payer: Medicare Other | Admitting: "Endocrinology

## 2018-12-29 ENCOUNTER — Encounter: Payer: Self-pay | Admitting: "Endocrinology

## 2018-12-29 DIAGNOSIS — I10 Essential (primary) hypertension: Secondary | ICD-10-CM

## 2018-12-29 DIAGNOSIS — Z794 Long term (current) use of insulin: Secondary | ICD-10-CM

## 2018-12-29 DIAGNOSIS — E118 Type 2 diabetes mellitus with unspecified complications: Secondary | ICD-10-CM | POA: Diagnosis not present

## 2018-12-29 DIAGNOSIS — IMO0002 Reserved for concepts with insufficient information to code with codable children: Secondary | ICD-10-CM

## 2018-12-29 DIAGNOSIS — E782 Mixed hyperlipidemia: Secondary | ICD-10-CM

## 2018-12-29 DIAGNOSIS — E1165 Type 2 diabetes mellitus with hyperglycemia: Secondary | ICD-10-CM

## 2018-12-29 NOTE — Progress Notes (Signed)
12/29/2018                                                    Endocrinology Telehealth Visit Follow up Note -During COVID -19 Pandemic  This visit type was conducted due to national recommendations for restrictions regarding the COVID-19 Pandemic  in an effort to limit this patient's exposure and mitigate transmission of the corona virus.  Due to her co-morbid illnesses, Diana Campbell is at  moderate to high risk for complications without adequate follow up.  This format is felt to be most appropriate for her at this time.  I connected with this patient on 12/29/2018   by telephone and verified that I am speaking with the correct person using two identifiers. Diana Campbell, September 08, 1945. she has verbally consented to this visit. All issues noted in this document were discussed and addressed. The format was not optimal for physical exam.    Subjective:    Patient ID: Diana Campbell, female    DOB: [birth year], PCP Jake Samples, PA-C   Past Medical History:  Diagnosis Date  . Arthritis   . Automatic implantable cardioverter-defibrillator in situ 07/2010  . Cardiac abnormality    with defibrilator  . Chronic combined systolic and diastolic CHF (congestive heart failure) ([place]) 09/04/2015  . Diabetes mellitus   . H/O cardiac arrest 2006  . Hypercholesteremia   . Hypertension   . Neuropathy    Past Surgical History:  Procedure Laterality Date  . 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  Marland Kitchen CARDIAC CATHETERIZATION  04/14/2000   Normal coronary arteries.  Marland Kitchen CARDIAC DEFIBRILLATOR PLACEMENT  07/21/2010   Medtronic Protecta XT DR, model#[id number], serial#[id number]  . CARDIOVASCULAR STRESS TEST  04/05/2000   Scintigraphic evidence of mild LV dilatation and global LV dysfunction with an EF 41% with mild inferolateral ischemia.  Marland Kitchen  CARPAL TUNNEL RELEASE Right 01/23/2016   Procedure: CARPAL TUNNEL RELEASE;  Surgeon: Carole Civil, MD;  Location: [facility];  Service: Orthopedics;  Laterality: Right;  pt knows to arrive at 6:15  . COLONOSCOPY  03/30/2011   Procedure: COLONOSCOPY;  Surgeon: Jamesetta So, MD;  Location: [facility];  Service: Gastroenterology;  Laterality: N/A;  . DORSAL COMPARTMENT RELEASE Right 08/11/2013   Procedure: RELEASE DORSAL COMPARTMENT (DEQUERVAIN);  Surgeon: Carole Civil, MD;  Location: [facility];  Service: Orthopedics;  Laterality: Right;  . ICD GENERATOR CHANGEOUT N/A 10/29/2017   Procedure: ICD GENERATOR CHANGEOUT;  Surgeon: Sanda Klein, MD;  Location: [facility];  Service: Cardiovascular;  Laterality: N/A;  . TRANSTHORACIC ECHOCARDIOGRAM  08/03/2011   EF 45-50%, mildly reduced LV systolic function.   Social History   Socioeconomic History  . Marital status: Married    Spouse name: Not on file  . Number of children: Not on file  . Years of education: Not on file  . Highest education level: Not on file  Occupational History  . Not on file  Tobacco Use  . Smoking status: Never Smoker  . Smokeless tobacco: Current User    Types: Snuff  . Tobacco comment: 3 dips per day  Substance and Sexual Activity  . Alcohol use: No  . Drug use: No  . Sexual  activity: Yes    Birth control/protection: None  Other Topics Concern  . Not on file  Social History Narrative  . Not on file   Social Determinants of Health   Financial Resource Strain:   . Difficulty of Paying Living Expenses: Not on file  Food Insecurity:   . Worried About Charity fundraiser in the Last Year: Not on file  . Ran Out of Food in the Last Year: Not on file  Transportation Needs:   . Lack of Transportation (Medical): Not on file  . Lack of Transportation (Non-Medical): Not on file  Physical Activity:   . Days of Exercise per Week: Not on file  . Minutes of Exercise per Session: Not on file  Stress:    . Feeling of Stress : Not on file  Social Connections:   . Frequency of Communication with Friends and Family: Not on file  . Frequency of Social Gatherings with Friends and Family: Not on file  . Attends Religious Services: Not on file  . Active Member of Clubs or Organizations: Not on file  . Attends Archivist Meetings: Not on file  . Marital Status: Not on file   Outpatient Encounter Medications as of 12/29/2018  Medication Sig  . ACCU-CHEK AVIVA PLUS test strip USE TO TEST BLOOD SUGAR 4 TIMES A DAY (DX: E11.65)  . ACCU-CHEK FASTCLIX LANCETS MISC 1 each by Does not apply route 2 (two) times daily.  Marland Kitchen acetaminophen (TYLENOL) 325 MG tablet Take 650 mg by mouth 3 (three) times daily.   Marland Kitchen aspirin EC 81 MG tablet Take 1 tablet (81 mg total) by mouth daily.  Marland Kitchen atorvastatin (LIPITOR) 10 MG tablet Take 10 mg by mouth at bedtime.   . carvedilol (COREG) 12.5 MG tablet TAKE 1/2 TABLET BY MOUTH TWICE A DAY WITH A MEAL  . Cholecalciferol (VITAMIN D PO) Take 1 tablet by mouth daily.  Marland Kitchen HUMALOG MIX 75/25 KWIKPEN (75-25) 100 UNIT/ML Kwikpen INJECT 40 UNITS INTO THE SKIN TWICE A DAY  . Lancets Misc. (ACCU-CHEK FASTCLIX LANCET) KIT 1 each by Does not apply route 2 (two) times daily.  Marland Kitchen lisinopril (ZESTRIL) 5 MG tablet Take 1 tablet 5 mg in morning and 1/2 tablet 2.5 mg in afternoon  . traMADol (ULTRAM) 50 MG tablet Take 50 mg by mouth every 6 (six) hours as needed.   No facility-administered encounter medications on file as of 12/29/2018.   ALLERGIES: No Known Allergies VACCINATION STATUS:  There is no immunization history on file for this patient.  Diabetes She is being engaged in telehealth via telephone in follow-up for her currently uncontrolled type 2 diabetes, hyperlipidemia, hypertension.   Onset time: She was diagnosed with diabetes at approximate age of 22 years. Her disease course has been fluctuating, currently worsening.   There are some mild asymptomatic hypoglycemic  episodes.  Pertinent negatives for hypoglycemia include no confusion, headaches, pallor or seizures. There are no diabetic associated symptoms. Pertinent negatives for diabetes include no chest pain, no polydipsia, no polyphagia and no polyuria. There are no hypoglycemic complications. Symptoms are worsening. Diabetic complications include nephropathy. Risk factors for coronary artery disease include diabetes mellitus, dyslipidemia, hypertension, sedentary lifestyle and tobacco exposure. Current diabetic treatment includes premixed insulin twice daily associated with monitoring of blood glucose at least 2 times a day.    -Her previsit labs show A1c of 7.8% increasing from 6.6%, although overall improving from 8.3%.   Her weight is stable, she complains of poor appetite. She  is following a generally unhealthy diet, regularly eats "junk food".  She has not had a previous visit with a dietitian (She declined a referral to CDE.)   An ACE inhibitor/angiotensin II receptor blocker is being taken. Eye exam is current.    Hyperlipidemia This is a chronic problem. The current episode started more than 1 year ago. Pertinent negatives include no chest pain, myalgias or shortness of breath. Current antihyperlipidemic treatment includes statins. Risk factors for coronary artery disease include dyslipidemia, diabetes mellitus and a sedentary lifestyle.  Hypertension This is a chronic problem. The current episode started more than 1 year ago. The problem is controlled. Pertinent negatives include no chest pain, headaches, palpitations or shortness of breath. Past treatments include ACE inhibitors. Hypertensive end-organ damage includes kidney disease.    Review of systems: Limited as above.   Objective:    There were no vitals taken for this visit.  Wt Readings from Last 3 Encounters:  08/22/18 204 lb (92.5 kg)  06/28/18 202 lb (91.6 kg)  02/18/18 197 lb (89.4 kg)      CMP Latest Ref Rng & Units  12/27/2018 06/20/2018 02/08/2018  Glucose 65 - 99 mg/dL 169(H) 111(H) 106(H)  BUN 7 - 25 mg/dL 16 18 13   Creatinine 0.60 - 0.93 mg/dL 0.92 1.02(H) 0.77  Sodium 135 - 146 mmol/L 141 143 144  Potassium 3.5 - 5.3 mmol/L 4.3 4.6 4.1  Chloride 98 - 110 mmol/L 106 108 106  CO2 20 - 32 mmol/L 29 29 29   Calcium 8.6 - 10.4 mg/dL 9.5 9.8 9.7  Total Protein 6.1 - 8.1 g/dL 6.7 6.9 6.9  Total Bilirubin 0.2 - 1.2 mg/dL 0.4 0.3 0.3  Alkaline Phos 38 - 126 U/L - - -  AST 10 - 35 U/L 13 15 15   ALT 6 - 29 U/L 7 9 18     Lab Results  Component Value Date   HGBA1C 7.8 (H) 12/27/2018   HGBA1C 6.6 (H) 06/20/2018   HGBA1C 6.7 (H) 02/08/2018   Lipid Panel     Component Value Date/Time   CHOL 164 06/20/2018 0900   TRIG 94 06/20/2018 0900   HDL 64 06/20/2018 0900   CHOLHDL 2.6 06/20/2018 0900   VLDL 14 09/26/2015 0856   LDLCALC 81 06/20/2018 0900     Assessment & Plan:   1. Type 2 diabetes mellitus with long-term current use of insulin (HCC)  -Her diabetes is complicated by cardiomyopathy and she remains at a high risk for more acute and chronic complications of diabetes which include CAD, CVA, CKD, retinopathy, and neuropathy. These are all discussed in detail with the patient.  Patient reports near target glycemic profile, no hypoglycemia at this time.  Her previsit A1c is 7.8% increasing from 6.6%.  However patient feels better this time.   Recent labs reviewed.   - I have re-counseled the patient on diet management and weight loss  by adopting a carbohydrate restricted / protein rich  Diet.  - she  admits there is a room for improvement in her diet and drink choices. -  Suggestion is made for her to avoid simple carbohydrates  from her diet including Cakes, Sweet Desserts / Pastries, Ice Cream, Soda (diet and regular), Sweet Tea, Candies, Chips, Cookies, Sweet Pastries,  Store Bought Juices, Alcohol in Excess of  1-2 drinks a day, Artificial Sweeteners, Coffee Creamer, and "Sugar-free" Products.  This will help patient to have stable blood glucose profile and potentially avoid unintended weight gain.   - Patient  is advised to stick to a routine mealtimes to eat 3 meals  a day and avoid unnecessary snacks ( to snack only to correct hypoglycemia).  - The patient  declined referral to a  CDE for individualized DM education.  - I have approached patient with the following individualized plan to manage diabetes and patient agrees.   - Patient  worries a lot about hypoglycemia, she changes around her insulin doses against recommendations.   -She has done relatively better on her current premixed insulin regimen.  She is advised to continue  Humalog75/25  30 units with breakfast and 30 units with supper for pre-meal blood glucose readings above 90 mg/dL, associated with monitoring of blood glucose 4 times a day-before meals and at bedtime.    -Patient is encouraged to call clinic for blood glucose levels less than 70 or above 200 mg /dl.  -Her labs show her renal function is improving. She would have benefited from metformin however she reports intolerance to metformin.   - Patient specific target  for A1c; LDL, HDL, Triglycerides, and  Waist Circumference were discussed in detail.  2) BP/HTN: she is advised to home monitor blood pressure and report if > 140/90 on 2 separate readings.   She is advised to continue her current blood pressure medications including lisinopril 7.5 mg p.o. daily.    3) Lipids/HPL: Recent lipid panel showed controlled LDL at 64.  She is advised to continue atorvastatin 10 mg p.o. nightly.    4)  Weight/Diet: She declined CDE consult . exercise, and carbohydrates information provided.  5) Chronic Care/Health Maintenance:  -Patient is on ACEI/ARB and Statin medications and encouraged to continue to follow up with Ophthalmology, Podiatrist at least yearly or according to recommendations, and advised to  stay away from smoking. I have recommended yearly flu vaccine  and pneumonia vaccination at least every 5 years; moderate intensity exercise for up to 150 minutes weekly; and  sleep for at least 7 hours a day.   - I advised patient to maintain close follow up with Jake Samples, PA-C for primary care needs.  - Patient Care Time Today:  25 min, of which >50% was spent in  counseling and the rest reviewing her  current and  previous labs/studies, previous treatments, her blood glucose readings, and medications' doses and developing a plan for long-term care based on the latest recommendations for standards of care.   Daney Golda Acre participated in the discussions, expressed understanding, and voiced agreement with the above plans.  All questions were answered to her satisfaction. she is encouraged to contact clinic should she have any questions or concerns prior to her return visit.   Follow up plan: -Return in about 4 months (around 04/29/2019) for Bring Meter and Logs- A1c in Office.  Glade Lloyd, MD Phone: [phone]  Fax: [phone]  -  This note was partially dictated with voice recognition software. Similar sounding words can be transcribed inadequately or may not  be corrected upon review.  12/29/2018, 12:56 PM

## 2019-01-16 ENCOUNTER — Ambulatory Visit (INDEPENDENT_AMBULATORY_CARE_PROVIDER_SITE_OTHER): Payer: Medicare Other

## 2019-01-16 DIAGNOSIS — Z95810 Presence of automatic (implantable) cardiac defibrillator: Secondary | ICD-10-CM

## 2019-01-16 DIAGNOSIS — I5042 Chronic combined systolic (congestive) and diastolic (congestive) heart failure: Secondary | ICD-10-CM

## 2019-01-16 NOTE — Progress Notes (Signed)
EPIC Encounter for ICM Monitoring  Patient Name: Diana Campbell is a 73 y.o. female Date: 01/16/2019 Primary Care Physican: Jake Samples, PA-C Primary Cardiologist:Kelly Electrophysiologist:Croitoru LastWeight:204lbs (in office)   Attempted call to patient and unable to reach. Transmission reviewed.   Optivol thoracic impedance normal.   Prescribed:No diuretic  Labs: 06/20/2018 Creatinine 1.02, BUN 18, Potassium 4.6, Sodium 143, GFR 54-63 02/08/2018 Creatinine0.77, BUN13, Potassium4.1, Sodium144, [id number] A complete set of results can be found in Results Review.  Recommendations:Unable to reach.    Follow-up plan: ICM clinic phone appointment on2/01/2019. 91 day device clinic remote transmission 02/01/2019.   Copy of ICM check sent to Dr.Croitoru.   3 month ICM trend: 01/16/2019    1 Year ICM trend:       [name], RN 01/16/2019 12:46 PM

## 2019-01-17 ENCOUNTER — Telehealth: Payer: Self-pay

## 2019-01-17 NOTE — Telephone Encounter (Signed)
Remote ICM transmission received.  Attempted call to patient regarding ICM remote transmission and no answer.  

## 2019-01-28 ENCOUNTER — Other Ambulatory Visit: Payer: Self-pay | Admitting: "Endocrinology

## 2019-02-01 ENCOUNTER — Ambulatory Visit (INDEPENDENT_AMBULATORY_CARE_PROVIDER_SITE_OTHER): Payer: Medicare PPO | Admitting: *Deleted

## 2019-02-01 DIAGNOSIS — I5042 Chronic combined systolic (congestive) and diastolic (congestive) heart failure: Secondary | ICD-10-CM | POA: Diagnosis not present

## 2019-02-01 LAB — CUP PACEART REMOTE DEVICE CHECK
Battery Remaining Longevity: 107 mo
Battery Voltage: 3.01 V
Brady Statistic AP VP Percent: 0.04 %
Brady Statistic AP VS Percent: 90.79 %
Brady Statistic AS VP Percent: 0.01 %
Brady Statistic AS VS Percent: 9.17 %
Brady Statistic RA Percent Paced: 89.82 %
Brady Statistic RV Percent Paced: 0.05 %
Date Time Interrogation Session: 20210113043624
HighPow Impedance: 43 Ohm
HighPow Impedance: 50 Ohm
Implantable Lead Implant Date: 20120702
Implantable Lead Implant Date: 20120702
Implantable Lead Location: 753859
Implantable Lead Location: 753860
Implantable Lead Model: 5076
Implantable Lead Model: 6947
Implantable Pulse Generator Implant Date: 20191011
Lead Channel Impedance Value: 342 Ohm
Lead Channel Impedance Value: 437 Ohm
Lead Channel Impedance Value: 456 Ohm
Lead Channel Pacing Threshold Amplitude: 0.75 V
Lead Channel Pacing Threshold Amplitude: 0.875 V
Lead Channel Pacing Threshold Pulse Width: 0.4 ms
Lead Channel Pacing Threshold Pulse Width: 0.4 ms
Lead Channel Sensing Intrinsic Amplitude: 11.625 mV
Lead Channel Sensing Intrinsic Amplitude: 11.625 mV
Lead Channel Sensing Intrinsic Amplitude: 2.5 mV
Lead Channel Sensing Intrinsic Amplitude: 2.5 mV
Lead Channel Setting Pacing Amplitude: 1.75 V
Lead Channel Setting Pacing Amplitude: 2 V
Lead Channel Setting Pacing Pulse Width: 0.4 ms
Lead Channel Setting Sensing Sensitivity: 0.3 mV

## 2019-02-08 DIAGNOSIS — M2550 Pain in unspecified joint: Secondary | ICD-10-CM | POA: Diagnosis not present

## 2019-02-08 DIAGNOSIS — Z6831 Body mass index (BMI) 31.0-31.9, adult: Secondary | ICD-10-CM | POA: Diagnosis not present

## 2019-02-08 DIAGNOSIS — E6609 Other obesity due to excess calories: Secondary | ICD-10-CM | POA: Diagnosis not present

## 2019-02-08 DIAGNOSIS — M792 Neuralgia and neuritis, unspecified: Secondary | ICD-10-CM | POA: Diagnosis not present

## 2019-02-08 DIAGNOSIS — Z1389 Encounter for screening for other disorder: Secondary | ICD-10-CM | POA: Diagnosis not present

## 2019-02-08 DIAGNOSIS — Z0000 Encounter for general adult medical examination without abnormal findings: Secondary | ICD-10-CM | POA: Diagnosis not present

## 2019-02-08 DIAGNOSIS — R5383 Other fatigue: Secondary | ICD-10-CM | POA: Diagnosis not present

## 2019-02-20 ENCOUNTER — Ambulatory Visit (INDEPENDENT_AMBULATORY_CARE_PROVIDER_SITE_OTHER): Payer: Medicare PPO

## 2019-02-20 DIAGNOSIS — I5042 Chronic combined systolic (congestive) and diastolic (congestive) heart failure: Secondary | ICD-10-CM | POA: Diagnosis not present

## 2019-02-20 DIAGNOSIS — Z95810 Presence of automatic (implantable) cardiac defibrillator: Secondary | ICD-10-CM

## 2019-02-22 ENCOUNTER — Telehealth: Payer: Self-pay

## 2019-02-22 NOTE — Progress Notes (Signed)
Great, Thanks.

## 2019-02-22 NOTE — Progress Notes (Signed)
EPIC Encounter for ICM Monitoring  Patient Name: Diana Campbell is a 74 y.o. female Date: 02/22/2019 Primary Care Physican: Avis Epley, PA-C Primary Cardiologist:Kelly Electrophysiologist:Croitoru LastWeight:204lbs (in office)   Attempted call to patient and unable to reach. Transmission reviewed.   Optivol thoracic impedance normal.   Prescribed:No diuretic  Labs: 06/20/2018 Creatinine 1.02, BUN 18, Potassium 4.6, Sodium 143, GFR 54-63 02/08/2018 Creatinine0.77, BUN13, Potassium4.1, Sodium144, XLL02-20 A complete set of results can be found in Results Review.  Recommendations:Unable to reach.    Follow-up plan: ICM clinic phone appointment on3/08/2019. 91 day device clinic remote transmission 05/03/2019.   Copy of ICM check sent to Dr.Croitoru.  3 month ICM trend: 02/20/2019    1 Year ICM trend:       [name], RN 02/22/2019 9:26 AM

## 2019-02-22 NOTE — Telephone Encounter (Signed)
Remote ICM transmission received.  Attempted call to patient regarding ICM remote transmission and memory is full, no message left

## 2019-03-27 ENCOUNTER — Ambulatory Visit (INDEPENDENT_AMBULATORY_CARE_PROVIDER_SITE_OTHER): Payer: Medicare PPO

## 2019-03-27 ENCOUNTER — Telehealth: Payer: Self-pay

## 2019-03-27 DIAGNOSIS — Z95810 Presence of automatic (implantable) cardiac defibrillator: Secondary | ICD-10-CM

## 2019-03-27 DIAGNOSIS — I5042 Chronic combined systolic (congestive) and diastolic (congestive) heart failure: Secondary | ICD-10-CM | POA: Diagnosis not present

## 2019-03-27 NOTE — Progress Notes (Signed)
EPIC Encounter for ICM Monitoring  Patient Name: Diana Campbell is a 74 y.o. female Date: 03/27/2019 Primary Care Physican: Avis Epley, PA-C Primary Cardiologist:Kelly Electrophysiologist:Croitoru LastWeight:204lbs (in office)   Attempted call to patient and unable to reach. Transmission reviewed.  Optivol thoracic impedance normal.   Prescribed:No diuretic  Labs: 12/27/2019 Creatinine 0.92, BUN 18, Potassium 4.3, Sodium 141, GFR 61-71 06/20/2018 Creatinine 1.02, BUN 18, Potassium 4.6, Sodium 143, GFR 54-63 02/08/2018 Creatinine0.77, BUN13, Potassium4.1, Sodium144, [id number] A complete set of results can be found in Results Review.  Recommendations:Unable to reach.  Follow-up plan: ICM clinic phone appointment on4/15/2021. 91 day device clinic remote transmission 05/03/2019.   Copy of ICM check sent to Dr.Croitoru.  3 month ICM trend: 03/27/2019    1 Year ICM trend:       [name], RN 03/27/2019 3:18 PM

## 2019-03-27 NOTE — Telephone Encounter (Signed)
Remote ICM transmission received.  Attempted call to patient regarding ICM remote transmission and no answer or answering machine. 

## 2019-03-28 NOTE — Progress Notes (Signed)
Thanks

## 2019-04-04 ENCOUNTER — Other Ambulatory Visit: Payer: Self-pay | Admitting: "Endocrinology

## 2019-05-02 ENCOUNTER — Encounter: Payer: Self-pay | Admitting: "Endocrinology

## 2019-05-02 ENCOUNTER — Ambulatory Visit (INDEPENDENT_AMBULATORY_CARE_PROVIDER_SITE_OTHER): Payer: Medicare PPO | Admitting: "Endocrinology

## 2019-05-02 ENCOUNTER — Other Ambulatory Visit: Payer: Self-pay

## 2019-05-02 VITALS — BP 158/67 | HR 66 | Ht 67.0 in | Wt 207.8 lb

## 2019-05-02 DIAGNOSIS — E118 Type 2 diabetes mellitus with unspecified complications: Secondary | ICD-10-CM

## 2019-05-02 DIAGNOSIS — I10 Essential (primary) hypertension: Secondary | ICD-10-CM | POA: Diagnosis not present

## 2019-05-02 DIAGNOSIS — Z794 Long term (current) use of insulin: Secondary | ICD-10-CM

## 2019-05-02 DIAGNOSIS — E782 Mixed hyperlipidemia: Secondary | ICD-10-CM

## 2019-05-02 DIAGNOSIS — IMO0002 Reserved for concepts with insufficient information to code with codable children: Secondary | ICD-10-CM

## 2019-05-02 DIAGNOSIS — E1165 Type 2 diabetes mellitus with hyperglycemia: Secondary | ICD-10-CM | POA: Diagnosis not present

## 2019-05-02 LAB — POCT GLYCOSYLATED HEMOGLOBIN (HGB A1C): Hemoglobin A1C: 8.3 % — AB (ref 4.0–5.6)

## 2019-05-02 MED ORDER — INSULIN LISPRO PROT & LISPRO (75-25 MIX) 100 UNIT/ML KWIKPEN: 30.0000 [IU] | PEN_INJECTOR | Freq: Two times a day (BID) | SUBCUTANEOUS | 2 refills | Status: DC

## 2019-05-02 NOTE — Progress Notes (Signed)
05/02/2019   Endocrinology follow-up note   Subjective:    Patient ID: Diana Campbell, female    DOB: 1945-11-12, PCP Scherrie Bateman   Past Medical History:  Diagnosis Date  . Arthritis   . Automatic implantable cardioverter-defibrillator in situ 07/2010  . Cardiac abnormality    with defibrilator  . Chronic combined systolic and diastolic CHF (congestive heart failure) ([place]) 09/04/2015  . Diabetes mellitus   . H/O cardiac arrest 2006  . Hypercholesteremia   . Hypertension   . Neuropathy    Past Surgical History:  Procedure Laterality Date  . 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  Marland Kitchen CARDIAC CATHETERIZATION  04/14/2000   Normal coronary arteries.  Marland Kitchen CARDIAC DEFIBRILLATOR PLACEMENT  07/21/2010   Medtronic Protecta XT DR, model#[id number], serial#[id number]  . CARDIOVASCULAR STRESS TEST  04/05/2000   Scintigraphic evidence of mild LV dilatation and global LV dysfunction with an EF 41% with mild inferolateral ischemia.  Marland Kitchen CARPAL TUNNEL RELEASE Right 01/23/2016   Procedure: CARPAL TUNNEL RELEASE;  Surgeon: Carole Civil, MD;  Location: [facility];  Service: Orthopedics;  Laterality: Right;  pt knows to arrive at 6:15  . COLONOSCOPY  03/30/2011   Procedure: COLONOSCOPY;  Surgeon: Jamesetta So, MD;  Location: [facility];  Service: Gastroenterology;  Laterality: N/A;  . DORSAL COMPARTMENT RELEASE Right 08/11/2013   Procedure: RELEASE DORSAL COMPARTMENT (DEQUERVAIN);  Surgeon: Carole Civil, MD;  Location: [facility];  Service: Orthopedics;  Laterality: Right;  . ICD GENERATOR CHANGEOUT N/A 10/29/2017   Procedure: ICD GENERATOR CHANGEOUT;  Surgeon: Sanda Klein, MD;  Location: [facility];  Service: Cardiovascular;  Laterality: N/A;  . TRANSTHORACIC ECHOCARDIOGRAM  08/03/2011   EF 45-50%, mildly reduced LV  systolic function.   Social History   Socioeconomic History  . Marital status: Married    Spouse name: Not on file  . Number of children: Not on file  . Years of education: Not on file  . Highest education level: Not on file  Occupational History  . Not on file  Tobacco Use  . Smoking status: Never Smoker  . Smokeless tobacco: Current User    Types: Snuff  . Tobacco comment: 3 dips per day  Substance and Sexual Activity  . Alcohol use: No  . Drug use: No  . Sexual activity: Yes    Birth control/protection: None  Other Topics Concern  . Not on file  Social History Narrative  . Not on file   Social Determinants of Health   Financial Resource Strain:   . Difficulty of Paying Living Expenses:   Food Insecurity:   . Worried About Charity fundraiser in the Last Year:   . Arboriculturist in the Last Year:   Transportation Needs:   . Film/video editor (Medical):   Marland Kitchen Lack of Transportation (Non-Medical):   Physical Activity:   . Days of Exercise per Week:   . Minutes of Exercise per Session:   Stress:   . Feeling of Stress :   Social Connections:   . Frequency of Communication with Friends and Family:   . Frequency of Social Gatherings with Friends and Family:   . Attends Religious Services:   . Active Member of Clubs or Organizations:   . Attends Archivist Meetings:   Marland Kitchen Marital Status:    Outpatient Encounter  Medications as of 05/02/2019  Medication Sig  . ACCU-CHEK AVIVA PLUS test strip USE TO TEST BLOOD SUGAR 4 TIMES A DAY (DX: E11.65)  . ACCU-CHEK FASTCLIX LANCETS MISC 1 each by Does not apply route 2 (two) times daily.  Marland Kitchen acetaminophen (TYLENOL) 325 MG tablet Take 650 mg by mouth 3 (three) times daily.   Marland Kitchen aspirin EC 81 MG tablet Take 1 tablet (81 mg total) by mouth daily.  Marland Kitchen atorvastatin (LIPITOR) 10 MG tablet Take 10 mg by mouth at bedtime.   . carvedilol (COREG) 12.5 MG tablet TAKE 1/2 TABLET BY MOUTH TWICE A DAY WITH A MEAL  . Cholecalciferol  (VITAMIN D PO) Take 1 tablet by mouth daily.  . Insulin Lispro Prot & Lispro (HUMALOG MIX 75/25 KWIKPEN) (75-25) 100 UNIT/ML Kwikpen Inject 30-35 Units into the skin 2 (two) times daily before a meal.  . Lancets Misc. (ACCU-CHEK FASTCLIX LANCET) KIT 1 each by Does not apply route 2 (two) times daily.  Marland Kitchen lisinopril (ZESTRIL) 5 MG tablet Take 1 tablet 5 mg in morning and 1/2 tablet 2.5 mg in afternoon  . [DISCONTINUED] HUMALOG MIX 75/25 KWIKPEN (75-25) 100 UNIT/ML Kwikpen INJECT 40 UNITS INTO THE SKIN TWICE A DAY  . [DISCONTINUED] traMADol (ULTRAM) 50 MG tablet Take 50 mg by mouth every 6 (six) hours as needed.   No facility-administered encounter medications on file as of 05/02/2019.   ALLERGIES: No Known Allergies VACCINATION STATUS: Immunization History  Administered Date(s) Administered  . Influenza-Unspecified 11/19/2017    Diabetes She is being engaged in telehealth via telephone in follow-up for her currently uncontrolled type 2 diabetes, hyperlipidemia, hypertension.   Onset time: She was diagnosed with diabetes at approximately 74 years of age.   Her disease course has been fluctuating, currently worsening.   There are some mild asymptomatic hypoglycemic episodes.  Pertinent negatives for hypoglycemia include no confusion, headaches, pallor or seizures. There are no diabetic associated symptoms. Pertinent negatives for diabetes include no chest pain, no polydipsia, no polyphagia and no polyuria. There are no hypoglycemic complications. Symptoms are worsening. Diabetic complications include nephropathy. Risk factors for coronary artery disease include diabetes mellitus, dyslipidemia, hypertension, sedentary lifestyle and tobacco exposure. Current diabetic treatment includes premixed insulin twice daily associated with monitoring of blood glucose at least 2 times a day.    -She presents with controlled fasting glycemic profile, significantly above target postprandial readings and  point-of-care A1c is 8.3%, increasing from 7.8% during her last visit.     Her weight is stable, she complains of poor appetite. She is following a generally unhealthy diet, regularly eats "junk food".  She has not had a previous visit with a dietitian (She declined a referral to CDE.)   An ACE inhibitor/angiotensin II receptor blocker is being taken. Eye exam is current.    Hyperlipidemia This is a chronic problem. The current episode started more than 1 year ago. Pertinent negatives include no chest pain, myalgias or shortness of breath. Current antihyperlipidemic treatment includes statins. Risk factors for coronary artery disease include dyslipidemia, diabetes mellitus and a sedentary lifestyle.  Hypertension This is a chronic problem. The current episode started more than 1 year ago. The problem is controlled. Pertinent negatives include no chest pain, headaches, palpitations or shortness of breath. Past treatments include ACE inhibitors. Hypertensive end-organ damage includes kidney disease.    Review of systems: Limited as above.   Objective:    BP (!) 158/67   Pulse 66   Ht 5' 7"  (1.702 m)  Wt 207 lb 12.8 oz (94.3 kg)   BMI 32.55 kg/m   Wt Readings from Last 3 Encounters:  05/02/19 207 lb 12.8 oz (94.3 kg)  08/22/18 204 lb (92.5 kg)  06/28/18 202 lb (91.6 kg)      CMP Latest Ref Rng & Units 12/27/2018 06/20/2018 02/08/2018  Glucose 65 - 99 mg/dL 169(H) 111(H) 106(H)  BUN 7 - 25 mg/dL 16 18 13   Creatinine 0.60 - 0.93 mg/dL 0.92 1.02(H) 0.77  Sodium 135 - 146 mmol/L 141 143 144  Potassium 3.5 - 5.3 mmol/L 4.3 4.6 4.1  Chloride 98 - 110 mmol/L 106 108 106  CO2 20 - 32 mmol/L 29 29 29   Calcium 8.6 - 10.4 mg/dL 9.5 9.8 9.7  Total Protein 6.1 - 8.1 g/dL 6.7 6.9 6.9  Total Bilirubin 0.2 - 1.2 mg/dL 0.4 0.3 0.3  Alkaline Phos 38 - 126 U/L - - -  AST 10 - 35 U/L 13 15 15   ALT 6 - 29 U/L 7 9 18     Lab Results  Component Value Date   HGBA1C 8.3 (A) 05/02/2019   HGBA1C 7.8  (H) 12/27/2018   HGBA1C 6.6 (H) 06/20/2018   Lipid Panel     Component Value Date/Time   CHOL 164 06/20/2018 0900   TRIG 94 06/20/2018 0900   HDL 64 06/20/2018 0900   CHOLHDL 2.6 06/20/2018 0900   VLDL 14 09/26/2015 0856   LDLCALC 81 06/20/2018 0900     Assessment & Plan:   1. Type 2 diabetes mellitus with long-term current use of insulin (HCC)  -Her diabetes is complicated by cardiomyopathy and she remains at a high risk for more acute and chronic complications of diabetes which include CAD, CVA, CKD, retinopathy, and neuropathy. These are all discussed in detail with the patient.  Patient reports near target fasting glycemic profile, above target postprandial blood glucose readings.  Her point-of-care A1c is 8.3% increasing from 7.8%.     However patient feels better this time.   Recent labs reviewed.   - I have re-counseled the patient on diet management and weight loss  by adopting a carbohydrate restricted / protein rich  Diet.  - she  admits there is a room for improvement in her diet and drink choices. -  Suggestion is made for her to avoid simple carbohydrates  from her diet including Cakes, Sweet Desserts / Pastries, Ice Cream, Soda (diet and regular), Sweet Tea, Candies, Chips, Cookies, Sweet Pastries,  Store Bought Juices, Alcohol in Excess of  1-2 drinks a day, Artificial Sweeteners, Coffee Creamer, and "Sugar-free" Products. This will help patient to have stable blood glucose profile and potentially avoid unintended weight gain.  - Patient is advised to stick to a routine mealtimes to eat 3 meals  a day and avoid unnecessary snacks ( to snack only to correct hypoglycemia).  - The patient  declined referral to a  CDE for individualized DM education.  - I have approached patient with the following individualized plan to manage diabetes and patient agrees.   - Patient  worries a lot about hypoglycemia, she changes around her insulin doses against recommendations.    -She has done relatively better on her current premixed insulin regimen.  She is advised to increase her Humalog 75/25 to 35 units with breakfast and continue 30 units with supper for pre-meal blood glucose readings above 90 mg/dL, associated with monitoring of blood glucose 4 times a day-before meals and at bedtime.    -Patient is encouraged  to call clinic for blood glucose levels less than 70 or above 200 mg /dl.  -Her labs show her renal function is improving. She would have benefited from metformin however she reports intolerance to metformin.   - Patient specific target  for A1c; LDL, HDL, Triglycerides, and  Waist Circumference were discussed in detail.  2) BP/HTN:  Her blood pressure is not controlled to target.   She is advised to continue her current blood pressure medications including lisinopril 7.5 mg p.o. daily.    3) Lipids/HPL: Recent lipid panel showed controlled LDL at 64.  She is advised to continue atorvastatin 10 mg p.o. nightly.  Side effects and precautions discussed with her.    4)  Weight/Diet: Her BMI 32.5, a candidate for modest weight loss.  She declined CDE consult . exercise, and carbohydrates information provided.  5) Chronic Care/Health Maintenance:  -Patient is on ACEI/ARB and Statin medications and encouraged to continue to follow up with Ophthalmology, Podiatrist at least yearly or according to recommendations, and advised to  stay away from smoking. I have recommended yearly flu vaccine and pneumonia vaccination at least every 5 years; moderate intensity exercise for up to 150 minutes weekly; and  sleep for at least 7 hours a day.   - I advised patient to maintain close follow up with Jake Samples, PA-C for primary care needs.  - Time spent on this patient care encounter:  35 min, of which > 50% was spent in  counseling and the rest reviewing her blood glucose logs , discussing her hypoglycemia and hyperglycemia episodes, reviewing her current and   previous labs / studies  ( including abstraction from other facilities) and medications  doses and developing a  long term treatment plan and documenting her care.   Please refer to Patient Instructions for Blood Glucose Monitoring and Insulin/Medications Dosing Guide"  in media tab for additional information. Please  also refer to " Patient Self Inventory" in the Media  tab for reviewed elements of pertinent patient history.  Delaynee Golda Acre participated in the discussions, expressed understanding, and voiced agreement with the above plans.  All questions were answered to her satisfaction. she is encouraged to contact clinic should she have any questions or concerns prior to her return visit.    Follow up plan: -Return in about 4 months (around 09/01/2019) for Bring Meter and Logs- A1c in Office, Follow up with Pre-visit Labs.  Glade Lloyd, MD Phone: [phone]  Fax: [phone]  -  This note was partially dictated with voice recognition software. Similar sounding words can be transcribed inadequately or may not  be corrected upon review.  05/02/2019, 1:00 PM

## 2019-05-02 NOTE — Patient Instructions (Signed)

## 2019-05-03 ENCOUNTER — Ambulatory Visit (INDEPENDENT_AMBULATORY_CARE_PROVIDER_SITE_OTHER): Payer: Medicare PPO | Admitting: *Deleted

## 2019-05-03 DIAGNOSIS — I5042 Chronic combined systolic (congestive) and diastolic (congestive) heart failure: Secondary | ICD-10-CM | POA: Diagnosis not present

## 2019-05-03 LAB — CUP PACEART REMOTE DEVICE CHECK
Battery Remaining Longevity: 104 mo
Battery Voltage: 3.01 V
Brady Statistic AP VP Percent: 0.03 %
Brady Statistic AP VS Percent: 81.34 %
Brady Statistic AS VP Percent: 0.01 %
Brady Statistic AS VS Percent: 18.62 %
Brady Statistic RA Percent Paced: 80.4 %
Brady Statistic RV Percent Paced: 0.04 %
Date Time Interrogation Session: 20210414043823
HighPow Impedance: 42 Ohm
HighPow Impedance: 50 Ohm
Implantable Lead Implant Date: 20120702
Implantable Lead Implant Date: 20120702
Implantable Lead Location: 753859
Implantable Lead Location: 753860
Implantable Lead Model: 5076
Implantable Lead Model: 6947
Implantable Pulse Generator Implant Date: 20191011
Lead Channel Impedance Value: 342 Ohm
Lead Channel Impedance Value: 437 Ohm
Lead Channel Impedance Value: 456 Ohm
Lead Channel Pacing Threshold Amplitude: 0.875 V
Lead Channel Pacing Threshold Amplitude: 0.875 V
Lead Channel Pacing Threshold Pulse Width: 0.4 ms
Lead Channel Pacing Threshold Pulse Width: 0.4 ms
Lead Channel Sensing Intrinsic Amplitude: 10.875 mV
Lead Channel Sensing Intrinsic Amplitude: 10.875 mV
Lead Channel Sensing Intrinsic Amplitude: 2.625 mV
Lead Channel Sensing Intrinsic Amplitude: 2.625 mV
Lead Channel Setting Pacing Amplitude: 1.75 V
Lead Channel Setting Pacing Amplitude: 2 V
Lead Channel Setting Pacing Pulse Width: 0.4 ms
Lead Channel Setting Sensing Sensitivity: 0.3 mV

## 2019-05-03 NOTE — Progress Notes (Signed)
ICD Remote  

## 2019-05-04 ENCOUNTER — Ambulatory Visit (INDEPENDENT_AMBULATORY_CARE_PROVIDER_SITE_OTHER): Payer: Medicare PPO

## 2019-05-04 DIAGNOSIS — I5042 Chronic combined systolic (congestive) and diastolic (congestive) heart failure: Secondary | ICD-10-CM

## 2019-05-04 DIAGNOSIS — Z95810 Presence of automatic (implantable) cardiac defibrillator: Secondary | ICD-10-CM

## 2019-05-05 ENCOUNTER — Telehealth: Payer: Self-pay

## 2019-05-05 NOTE — Telephone Encounter (Signed)
Remote ICM transmission received.  Attempted call to patient regarding ICM remote transmission and no answer or answering machine. 

## 2019-05-05 NOTE — Progress Notes (Signed)
EPIC Encounter for ICM Monitoring  Patient Name: Diana Campbell is a 74 y.o. female Date: 05/05/2019 Primary Care Physican: Ladon Applebaum Primary Cardiologist:Kelly Electrophysiologist:Croitoru 05/02/2019 OfficeWeight:207lbs    Attempted call to patient and unable to reach.  Transmission reviewed.   Optivol thoracic impedance suggesting possible fluid accumulation 04/14/2019 but starting to trend back toward baseline.   Prescribed:No diuretic  Labs: 12/27/2018 Creatinine 0.92, BUN 18, Potassium 4.3, Sodium 141, GFR 61-71 06/20/2018 Creatinine 1.02, BUN 18, Potassium 4.6, Sodium 143, GFR 54-63 02/08/2018 Creatinine0.77, BUN13, Potassium4.1, Sodium144, [id number] A complete set of results can be found in Results Review.  Recommendations: Unable to reach.    Follow-up plan: ICM clinic phone appointment on4/22/2021 recheck fluid levels. 91 day device clinic remote transmission7/14/2021.   Copy of ICM check sent to Dr.Croitoru and Dr Tresa Endo.  3 month ICM trend: 05/04/2019    1 Year ICM trend:       [name], RN 05/05/2019 11:40 AM

## 2019-05-11 ENCOUNTER — Ambulatory Visit (INDEPENDENT_AMBULATORY_CARE_PROVIDER_SITE_OTHER): Payer: Medicare PPO

## 2019-05-11 ENCOUNTER — Other Ambulatory Visit: Payer: Self-pay | Admitting: Cardiovascular Disease

## 2019-05-11 DIAGNOSIS — Z95810 Presence of automatic (implantable) cardiac defibrillator: Secondary | ICD-10-CM

## 2019-05-11 DIAGNOSIS — I5042 Chronic combined systolic (congestive) and diastolic (congestive) heart failure: Secondary | ICD-10-CM

## 2019-05-11 NOTE — Telephone Encounter (Signed)
Rx(s) sent to pharmacy electronically. OV 05/12/19 with Wynema Birch PA

## 2019-05-12 ENCOUNTER — Telehealth: Payer: Self-pay

## 2019-05-12 ENCOUNTER — Encounter: Payer: Self-pay | Admitting: Physician Assistant

## 2019-05-12 ENCOUNTER — Telehealth (INDEPENDENT_AMBULATORY_CARE_PROVIDER_SITE_OTHER): Payer: Medicare PPO | Admitting: Physician Assistant

## 2019-05-12 VITALS — BP 162/86 | HR 64 | Wt 204.0 lb

## 2019-05-12 DIAGNOSIS — E782 Mixed hyperlipidemia: Secondary | ICD-10-CM

## 2019-05-12 DIAGNOSIS — Z95810 Presence of automatic (implantable) cardiac defibrillator: Secondary | ICD-10-CM

## 2019-05-12 DIAGNOSIS — E785 Hyperlipidemia, unspecified: Secondary | ICD-10-CM | POA: Diagnosis not present

## 2019-05-12 DIAGNOSIS — I2510 Atherosclerotic heart disease of native coronary artery without angina pectoris: Secondary | ICD-10-CM | POA: Diagnosis not present

## 2019-05-12 DIAGNOSIS — E119 Type 2 diabetes mellitus without complications: Secondary | ICD-10-CM | POA: Diagnosis not present

## 2019-05-12 DIAGNOSIS — I5042 Chronic combined systolic (congestive) and diastolic (congestive) heart failure: Secondary | ICD-10-CM | POA: Diagnosis not present

## 2019-05-12 DIAGNOSIS — I110 Hypertensive heart disease with heart failure: Secondary | ICD-10-CM | POA: Diagnosis not present

## 2019-05-12 DIAGNOSIS — F17290 Nicotine dependence, other tobacco product, uncomplicated: Secondary | ICD-10-CM

## 2019-05-12 DIAGNOSIS — I4891 Unspecified atrial fibrillation: Secondary | ICD-10-CM | POA: Diagnosis not present

## 2019-05-12 DIAGNOSIS — I10 Essential (primary) hypertension: Secondary | ICD-10-CM

## 2019-05-12 DIAGNOSIS — I480 Paroxysmal atrial fibrillation: Secondary | ICD-10-CM

## 2019-05-12 NOTE — Patient Instructions (Addendum)
Medication Instructions:  Your physician recommends that you continue on your current medications as directed. Please refer to the Current Medication list given to you today.  *If you need a refill on your cardiac medications before your next appointment, please call your pharmacy*  Lab Work: NONE ordered at this time of appointment   If you have labs (blood work) drawn today and your tests are completely normal, you will receive your results only by: Marland Kitchen MyChart Message (if you have MyChart) OR . A paper copy in the mail If you have any lab test that is abnormal or we need to change your treatment, we will call you to review the results.  Testing/Procedures: NONE ordered at this time of appointment   Follow-Up: At [facility], you and your health needs are our priority.  As part of our continuing mission to provide you with exceptional heart care, we have created designated Provider Care Teams.  These Care Teams include your primary Cardiologist (physician) and Advanced Practice Providers (APPs -  Physician Assistants and Nurse Practitioners) who all work together to provide you with the care you need, when you need it.  We recommend signing up for the patient portal called "MyChart".  Sign up information is provided on this After Visit Summary.  MyChart is used to connect with patients for Virtual Visits (Telemedicine).  Patients are able to view lab/test results, encounter notes, upcoming appointments, etc.  Non-urgent messages can be sent to your provider as well.   To learn more about what you can do with MyChart, go to [email or web address].    Your next appointment:   6 month(s)  1 year(s)   The format for your next appointment:   In Person  In Person  Provider:   Thurmon Fair, MD  Lennette Bihari, MD  Other Instructions  Monitor blood pressure daily. Take your blood pressure 2 hours after taking morning medications. If your systolic (top) number of your blood pressure  is greater than 140 take a whole tablet of Lisinopril 2 times a day.

## 2019-05-12 NOTE — Progress Notes (Signed)
EPIC Encounter for ICM Monitoring  Patient Name: Diana Campbell is a 74 y.o. female Date: 05/12/2019 Primary Care Physican: Ladon Applebaum Primary Cardiologist:Kelly Electrophysiologist:Croitoru 05/02/2019 OfficeWeight:207lbs    Attempted call to patient and unable to reach.  Transmission reviewed.   Optivol thoracic impedance returned to baseline after 4/15 remote transmission but impedance trending just below baseline showing possible fluid accumulation starting again 05/06/19.   Prescribed:No diuretic  Labs: 12/27/2018 Creatinine 0.92, BUN 18, Potassium 4.3, Sodium 141, GFR 61-71 06/20/2018 Creatinine 1.02, BUN 18, Potassium 4.6, Sodium 143, GFR 54-63 02/08/2018 Creatinine0.77, BUN13, Potassium4.1, Sodium144, [id number] A complete set of results can be found in Results Review.  Recommendations: Unable to reach.    Follow-up plan: ICM clinic phone appointment on4/28/2021 recheck fluid levels. 91 day device clinic remote transmission7/14/2021.   Copy of ICM check sent to Dr.Croitoru and Dr Tresa Endo.  3 month ICM trend: 05/11/2019    1 Year ICM trend:       [name], RN 05/12/2019 11:27 AM

## 2019-05-12 NOTE — Telephone Encounter (Signed)
Remote ICM transmission received.  Attempted call to patient regarding ICM remote transmission and no answer or answering machine. 

## 2019-05-12 NOTE — Progress Notes (Signed)
Virtual Visit via Telephone Note   This visit type was conducted due to national recommendations for restrictions regarding the COVID-19 Pandemic (e.g. social distancing) in an effort to limit this patient's exposure and mitigate transmission in our community.  Due to her co-morbid illnesses, this patient is at least at moderate risk for complications without adequate follow up.  This format is felt to be most appropriate for this patient at this time.  The patient did not have access to video technology/had technical difficulties with video requiring transitioning to audio format only (telephone).  All issues noted in this document were discussed and addressed.  No physical exam could be performed with this format.  Please refer to the patient's chart for her  consent to telehealth for [facility].   The patient was identified using 2 identifiers.  Date:  05/14/2019   ID:  Diana, Campbell 1945-04-08, MRN [id number]  Patient Location: Home Provider Location: Office  PCP:  Avis Epley, PA-C  Cardiologist:  Nicki Guadalajara, MD  Electrophysiologist:  Thurmon Fair, MD   Evaluation Performed:  Follow-Up Visit  Chief Complaint:  followup  History of Present Illness:    Diana Campbell is a 74 y.o. female with PMH of remote VF arrest in June 2005 status post ICD and revision in July 2012 with generator change October 2019, DM 2 on insulin, HTN, HLD, PAF and chronic combined systolic and diastolic heart failure.  Cardiac catheterization performed in March 2002 showed normal coronary arteries with nondominant RCA.  In 2005, patient had a witnessed cardiac arrest at work and was found to be in ventricular fibrillation.  Cardiac catheterization at the time showed EF 15%, trivial single-vessel CAD with 40% in the proximal intermediate ramus vessel, otherwise normal coronary arteries in other vessels, low cardiac output and moderate pulmonary hypertension.  He underwent ICD implantation  and later underwent revision and the device change.  Last device change was seen October 2019.  Last echocardiogram obtained in March 2019 showed EF 40 to 45%.  Patient had one brief episode of PAF recorded by ICD in the past.  Patient was last seen by Corine Shelter, PA-C on 08/22/2018 at which time she was doing well.  Her aspirin was decreased to 81 mg daily.  Patient presents today for cardiology virtual visit.  She denies any chest pain or shortness of breath.  She did has some ankle edema however this is resolving.  She also had some leg pain which she attributed to the weather.  She did have the first dose of vaccine earlier this month and is due for the second dose of vaccine near the end of this month.  Overall she is doing quite well from cardiology perspective and has been fairly independent at home.  She can follow-up with Dr. Royann Shivers in 6 months and Dr. Tresa Endo in 1 year.   The patient does not have symptoms concerning for COVID-19 infection (fever, chills, cough, or new shortness of breath).    Past Medical History:  Diagnosis Date  . Arthritis   . Automatic implantable cardioverter-defibrillator in situ 07/2010  . Cardiac abnormality    with defibrilator  . Chronic combined systolic and diastolic CHF (congestive heart failure) (HCC) 09/04/2015  . Diabetes mellitus   . H/O cardiac arrest 2006  . Hypercholesteremia   . Hypertension   . Neuropathy    Past Surgical History:  Procedure Laterality Date  . CARDIAC CATHETERIZATION  06/22/2003   Severe nonischemic cardiomyopathy, suspect recent  or even chronic smoldering myocarditis, trivial single vessel CAD, moderate pulmonary HTN, low cardiac output.  Marland Kitchen CARDIAC CATHETERIZATION  04/14/2000   Normal coronary arteries.  Marland Kitchen CARDIAC DEFIBRILLATOR PLACEMENT  07/21/2010   Medtronic Protecta XT DR, model#[id number], serial#[id number]  . CARDIOVASCULAR STRESS TEST  04/05/2000   Scintigraphic evidence of mild LV dilatation and global LV dysfunction with  an EF 41% with mild inferolateral ischemia.  Marland Kitchen CARPAL TUNNEL RELEASE Right 01/23/2016   Procedure: CARPAL TUNNEL RELEASE;  Surgeon: Carole Civil, MD;  Location: [facility];  Service: Orthopedics;  Laterality: Right;  pt knows to arrive at 6:15  . COLONOSCOPY  03/30/2011   Procedure: COLONOSCOPY;  Surgeon: Jamesetta So, MD;  Location: [facility];  Service: Gastroenterology;  Laterality: N/A;  . DORSAL COMPARTMENT RELEASE Right 08/11/2013   Procedure: RELEASE DORSAL COMPARTMENT (DEQUERVAIN);  Surgeon: Carole Civil, MD;  Location: [facility];  Service: Orthopedics;  Laterality: Right;  . ICD GENERATOR CHANGEOUT N/A 10/29/2017   Procedure: ICD GENERATOR CHANGEOUT;  Surgeon: Sanda Klein, MD;  Location: [place] [facility];  Service: Cardiovascular;  Laterality: N/A;  . TRANSTHORACIC ECHOCARDIOGRAM  08/03/2011   EF 45-50%, mildly reduced LV systolic function.     Current Meds  Medication Sig  . ACCU-CHEK AVIVA PLUS test strip USE TO TEST BLOOD SUGAR 4 TIMES A DAY (DX: E11.65)  . ACCU-CHEK FASTCLIX LANCETS MISC 1 each by Does not apply route 2 (two) times daily.  Marland Kitchen aspirin EC 81 MG tablet Take 1 tablet (81 mg total) by mouth daily.  Marland Kitchen atorvastatin (LIPITOR) 10 MG tablet Take 10 mg by mouth at bedtime.   . carvedilol (COREG) 12.5 MG tablet TAKE 1/2 TABLET BY MOUTH TWICE A DAY WITH A MEAL  . Insulin Lispro Prot & Lispro (HUMALOG MIX 75/25 KWIKPEN) (75-25) 100 UNIT/ML Kwikpen Inject 30-35 Units into the skin 2 (two) times daily before a meal.  . lisinopril (ZESTRIL) 5 MG tablet TAKE 1 TABLET BY MOUTH EVERY MORNING AND 1/2 TABLET AFTERNOON     Allergies:   Patient has no known allergies.   Social History   Tobacco Use  . Smoking status: Never Smoker  . Smokeless tobacco: Current User    Types: Snuff  . Tobacco comment: 3 dips per day  Substance Use Topics  . Alcohol use: No  . Drug use: No     Family Hx: The patient's family history is not on file.  ROS:   Please see the history  of present illness.     All other systems reviewed and are negative.   Prior CV studies:   The following studies were reviewed today:  Echo 04/07/2017 LV EF: 40% -  45%   -------------------------------------------------------------------  Indications:   Cardiomyopathy (I42.8).   -------------------------------------------------------------------  History:  PMH: h/o Cardiac Arrest, Defibrillator. Risk factors:  Hypertension. Diabetes mellitus.   -------------------------------------------------------------------  Study Conclusions   - Left ventricle: The cavity size was mildly dilated. Wall  thickness was normal. Systolic function was mildly to moderately  reduced. The estimated ejection fraction was in the range of 40%  to 45%. Diffuse hypokinesis. Doppler parameters are consistent  with abnormal left ventricular relaxation (grade 1 diastolic  dysfunction).  - Aortic valve: There was no stenosis.  - Mitral valve: There was trivial regurgitation.  - Left atrium: The atrium was mildly dilated.  - Right ventricle: The cavity size was normal. Pacer wire or  catheter noted in right ventricle. Systolic function was normal.  - Tricuspid valve:  Peak RV-RA gradient (S): 30 mm Hg.  - Pulmonary arteries: PA peak pressure: 33 mm Hg (S).  - Inferior vena cava: The vessel was normal in size. The  respirophasic diameter changes were in the normal range (= 50%),  consistent with normal central venous pressure.   Impressions:   - Mildly dilated LV with mild to moderate diffuse hypokinesis, EF  40-45%. Normal RV size and systolic function. No significant  valvular abnormalities.   Labs/Other Tests and Data Reviewed:    EKG:  An ECG dated 08/22/2018 was personally reviewed today and demonstrated:  Atrial paced rhythm, left bundle branch block.  Recent Labs: 12/27/2018: ALT 7; BUN 16; Creat 0.92; Potassium 4.3; Sodium 141   Recent Lipid Panel Lab Results    Component Value Date/Time   CHOL 164 06/20/2018 09:00 AM   TRIG 94 06/20/2018 09:00 AM   HDL 64 06/20/2018 09:00 AM   CHOLHDL 2.6 06/20/2018 09:00 AM   LDLCALC 81 06/20/2018 09:00 AM    Wt Readings from Last 3 Encounters:  05/12/19 204 lb (92.5 kg)  05/02/19 207 lb 12.8 oz (94.3 kg)  08/22/18 204 lb (92.5 kg)     Objective:    Vital Signs:  BP (!) 162/86   Pulse 64   Wt 204 lb (92.5 kg)   BMI 31.95 kg/m    VITAL SIGNS:  reviewed  ASSESSMENT & PLAN:    1. Chronic combined systolic and diastolic heart failure: Last ejection fraction was about 40 to 45% by echocardiogram in 2019.  She denies any recent heart failure symptoms such as orthopnea or PND.  Last year device interrogation suggested signs of volume overload.  She did have some lower extremity edema, however this has resolved.  2. Hypertension: Blood pressure is elevated today, however normally her blood pressure is very well controlled.  I did not adjust her blood pressure medication today.  3. Hyperlipidemia: Continue Lipitor  4. DM2: Managed by primary care provider  5. PAF: Seen on previous device interrogation.  Continue carvedilol.  Currently not on systemic anticoagulation therapy as A. fib burden is fairly low.  6. History of V. fib arrest s/p ICD: V. fib arrest occurred in June 2005.  Followed by Dr. Royann Shivers.  Device functioning normally on last interrogation April 2021, last remote transmission did not indicate signs of volume overload.  7. CAD: Minimal disease with less than 40% lesion in ramus intermedius vessel in 2005.  Denies any recent chest pain.  COVID-19 Education: The signs and symptoms of COVID-19 were discussed with the patient and how to seek care for testing (follow up with PCP or arrange E-visit).  The importance of social distancing was discussed today.  Time:   Today, I have spent 13 minutes with the patient with telehealth technology discussing the above problems.     Medication  Adjustments/Labs and Tests Ordered: Current medicines are reviewed at length with the patient today.  Concerns regarding medicines are outlined above.   Tests Ordered: No orders of the defined types were placed in this encounter.   Medication Changes: No orders of the defined types were placed in this encounter.   Follow Up:  Either In Person or Virtual in 6 month(s)  Signed, Azalee Course, [place]  05/14/2019 11:27 PM    [facility]

## 2019-05-12 NOTE — Telephone Encounter (Signed)
  Patient Consent for Virtual Visit         Diana Campbell has provided verbal consent on 05/12/2019 for a virtual visit (video or telephone).   CONSENT FOR VIRTUAL VISIT FOR:  Diana Campbell  By participating in this virtual visit I agree to the following:  I hereby voluntarily request, consent and authorize [facility] and its employed or contracted physicians, physician assistants, nurse practitioners or other licensed health care professionals (the Practitioner), to provide me with telemedicine health care services (the "Services") as deemed necessary by the treating Practitioner. I acknowledge and consent to receive the Services by the Practitioner via telemedicine. I understand that the telemedicine visit will involve communicating with the Practitioner through live audiovisual communication technology and the disclosure of certain medical information by electronic transmission. I acknowledge that I have been given the opportunity to request an in-person assessment or other available alternative prior to the telemedicine visit and am voluntarily participating in the telemedicine visit.  I understand that I have the right to withhold or withdraw my consent to the use of telemedicine in the course of my care at any time, without affecting my right to future care or treatment, and that the Practitioner or I may terminate the telemedicine visit at any time. I understand that I have the right to inspect all information obtained and/or recorded in the course of the telemedicine visit and may receive copies of available information for a reasonable fee.  I understand that some of the potential risks of receiving the Services via telemedicine include:  Marland Kitchen Delay or interruption in medical evaluation due to technological equipment failure or disruption; . Information transmitted may not be sufficient (e.g. poor resolution of images) to allow for appropriate medical decision making by the  Practitioner; and/or  . In rare instances, security protocols could fail, causing a breach of personal health information.  Furthermore, I acknowledge that it is my responsibility to provide information about my medical history, conditions and care that is complete and accurate to the best of my ability. I acknowledge that Practitioner's advice, recommendations, and/or decision may be based on factors not within their control, such as incomplete or inaccurate data provided by me or distortions of diagnostic images or specimens that may result from electronic transmissions. I understand that the practice of medicine is not an exact science and that Practitioner makes no warranties or guarantees regarding treatment outcomes. I acknowledge that a copy of this consent can be made available to me via my patient portal [facility] MyChart), or I can request a printed copy by calling the office of [facility].    I understand that my insurance will be billed for this visit.   I have read or had this consent read to me. . I understand the contents of this consent, which adequately explains the benefits and risks of the Services being provided via telemedicine.  . I have been provided ample opportunity to ask questions regarding this consent and the Services and have had my questions answered to my satisfaction. . I give my informed consent for the services to be provided through the use of telemedicine in my medical care

## 2019-05-12 NOTE — Telephone Encounter (Signed)
Called patient to discuss AVS instructions gave Hao Meng's recommendations and patient voiced understanding. AVS summary mailed to patient.    

## 2019-05-12 NOTE — Progress Notes (Signed)
I'll try to call her over the weekend, I am on call.

## 2019-05-14 MED ORDER — FUROSEMIDE 40 MG PO TABS: 40.0000 mg | ORAL_TABLET | Freq: Every day | ORAL | 3 refills | Status: DC | PRN

## 2019-05-14 NOTE — Progress Notes (Signed)
Managed to get in touch with her. She has been feeling a little short of breath (when she lays down in bed at night "she feels like she has been running").  And has a little bit of swelling around the ankles. Asked her to be mindful of the sodium in her diet.  She weighs herself every other day but does not remember exactly what the weight was "2 - 0 - something". I will call in a prescription for furosemide 40 mg daily and I told to take 1 on Monday morning and one on Tuesday morning, then we will recheck her fluid status by OptiVol on Wednesday morning.

## 2019-05-14 NOTE — Addendum Note (Signed)
Addended by: Thurmon Fair on: 05/14/2019 04:49 PM   Modules accepted: Orders

## 2019-05-15 NOTE — Progress Notes (Signed)
Thank you.  I will follow up with her on Wednesday to see how she is feeling and get an updated Optivol.  ICM recheck scheduled for 05/17/2019.

## 2019-05-17 ENCOUNTER — Ambulatory Visit (INDEPENDENT_AMBULATORY_CARE_PROVIDER_SITE_OTHER): Payer: Medicare PPO

## 2019-05-17 DIAGNOSIS — Z95810 Presence of automatic (implantable) cardiac defibrillator: Secondary | ICD-10-CM

## 2019-05-17 DIAGNOSIS — I5042 Chronic combined systolic (congestive) and diastolic (congestive) heart failure: Secondary | ICD-10-CM

## 2019-05-17 NOTE — Progress Notes (Signed)
Thank you :)

## 2019-05-17 NOTE — Progress Notes (Signed)
EPIC Encounter for ICM Monitoring  Patient Name: Diana Campbell is a 74 y.o. female Date: 05/17/2019 Primary Care Physican: Avis Epley, PA-C Primary Cardiologist:Kelly Electrophysiologist:Croitoru 05/02/2019 OfficeWeight:207lbs  05/17/2019 Weight: 202.8 lbs   Spoke with patient. She lost 4 lbs, ankle swelling and nigh time shortness of breath resolved.  Optivol thoracic impedance returned to baseline normal after taking Furosemide for a few day as prescribed by Dr Royann Shivers.  Prescribed:Furosemide 40 mg take 1 tablet as needed for fluid  Labs: 12/08/2020Creatinine 0.92, BUN 18, Potassium 4.3, Sodium 141, GFR 61-71 06/20/2018 Creatinine 1.02, BUN 18, Potassium 4.6, Sodium 143, GFR 54-63 02/08/2018 Creatinine0.77, BUN13, Potassium4.1, Sodium144, [id number] A complete set of results can be found in Results Review.  Recommendations:Advised Dr Royann Shivers prescribed the Furosemide as needed and explained to take when she has the symptoms she was experiencing the last couple of weeks.  Advised to limit salt intake to 2000 mg daily and encouraged to read food labels.    Follow-up plan: ICM clinic phone appointment on5/24/2021.91 day device clinic remote transmission7/14/2021.   Copy of ICM check sent to Dr.Croitoru.  3 month ICM trend: 05/17/2019    1 Year ICM trend:       [name], RN 05/17/2019 2:01 PM

## 2019-06-12 ENCOUNTER — Ambulatory Visit (INDEPENDENT_AMBULATORY_CARE_PROVIDER_SITE_OTHER): Payer: Medicare PPO

## 2019-06-12 DIAGNOSIS — I5042 Chronic combined systolic (congestive) and diastolic (congestive) heart failure: Secondary | ICD-10-CM

## 2019-06-12 DIAGNOSIS — Z95810 Presence of automatic (implantable) cardiac defibrillator: Secondary | ICD-10-CM

## 2019-06-13 ENCOUNTER — Telehealth: Payer: Self-pay

## 2019-06-13 DIAGNOSIS — Z95810 Presence of automatic (implantable) cardiac defibrillator: Secondary | ICD-10-CM

## 2019-06-13 DIAGNOSIS — I5042 Chronic combined systolic (congestive) and diastolic (congestive) heart failure: Secondary | ICD-10-CM

## 2019-06-13 NOTE — Telephone Encounter (Signed)
Remote ICM transmission received.  Attempted call to patient regarding ICM remote transmission and left detailed message per DPR to return call.   

## 2019-06-13 NOTE — Progress Notes (Signed)
Spoke with patient and transmission reviewed.  She reports getting SOB when bending over to pick up something but other wise feels fine. Advised to take PRN Furosemide 1 tablet x 2 days and then return to PRN.  Will recheck remote transmission on 5/28.

## 2019-06-13 NOTE — Progress Notes (Signed)
EPIC Encounter for ICM Monitoring  Patient Name: Diana Campbell is a 74 y.o. female Date: 06/13/2019 Primary Care Physican: Ladon Applebaum Primary Cardiologist:Kelly Electrophysiologist:Croitoru 05/17/2019 Weight: 202.8 lbs   Attempted call to patient and unable to reach.  Left detailed message per DPR regarding transmission. Transmission reviewed.   Optivol thoracic impedance slightly below baseline normal suggesting possible mildly fluid accumulation.  Prescribed:Furosemide 40 mg take 1 tablet as needed for fluid  Labs: 12/08/2020Creatinine 0.92, BUN 18, Potassium 4.3, Sodium 141, GFR 61-71 06/20/2018 Creatinine 1.02, BUN 18, Potassium 4.6, Sodium 143, GFR 54-63 02/08/2018 Creatinine0.77, BUN13, Potassium4.1, Sodium144, [id number] A complete set of results can be found in Results Review.  Recommendations:Left voice mail with ICM number and encouraged to call if experiencing any fluid symptoms. Will advise patient to take PRN Furosemide if reached.  Follow-up plan: ICM clinic phone appointment on5/28/2021 to recheck fluid levels.91 day device clinic remote transmission7/14/2021.   Copy of ICM check sent to Dr.Croitoru and Dr Tresa Endo.  3 month ICM trend: 06/12/2019    1 Year ICM trend:       [name], RN 06/13/2019 9:49 AM

## 2019-06-16 ENCOUNTER — Telehealth: Payer: Self-pay

## 2019-06-16 ENCOUNTER — Ambulatory Visit (INDEPENDENT_AMBULATORY_CARE_PROVIDER_SITE_OTHER): Payer: Medicare PPO

## 2019-06-16 DIAGNOSIS — I5042 Chronic combined systolic (congestive) and diastolic (congestive) heart failure: Secondary | ICD-10-CM

## 2019-06-16 DIAGNOSIS — Z95810 Presence of automatic (implantable) cardiac defibrillator: Secondary | ICD-10-CM

## 2019-06-16 NOTE — Telephone Encounter (Signed)
Remote ICM transmission received.  Attempted call to patient regarding ICM remote transmission and left detailed message per DPR.  Advised to return call for any fluid symptoms or questions.  

## 2019-06-16 NOTE — Progress Notes (Signed)
EPIC Encounter for ICM Monitoring  Patient Name: Diana Campbell is a 74 y.o. female Date: 06/16/2019 Primary Care Physican: Ladon Applebaum Primary Cardiologist:Kelly Electrophysiologist:Croitoru 05/17/2019 Weight: 202.8 lbs   Attempted call to patient and unable to reach.  Left detailed message per DPR regarding transmission. Transmission reviewed.   Optivol thoracic impedancereturned to normal after taking PRN Furosemide.  Prescribed:Furosemide 40 mg take 1 tablet as needed for fluid  Labs: 12/08/2020Creatinine 0.92, BUN 18, Potassium 4.3, Sodium 141, GFR 61-71 06/20/2018 Creatinine 1.02, BUN 18, Potassium 4.6, Sodium 143, GFR 54-63 02/08/2018 Creatinine0.77, BUN13, Potassium4.1, Sodium144, [id number] A complete set of results can be found in Results Review.  Recommendations:Left voice mail with ICM number and encouraged to call if experiencing any fluid symptoms.   Follow-up plan: ICM clinic phone appointment on6/28/2021.91 day device clinic remote transmission7/14/2021.   Copy of ICM check sent to Dr.Croitoru.  3 month ICM trend: 06/16/2019    1 Year ICM trend:       [name], RN 06/16/2019 3:58 PM

## 2019-07-17 ENCOUNTER — Telehealth: Payer: Self-pay

## 2019-07-17 ENCOUNTER — Ambulatory Visit (INDEPENDENT_AMBULATORY_CARE_PROVIDER_SITE_OTHER): Payer: Medicare PPO

## 2019-07-17 DIAGNOSIS — I5042 Chronic combined systolic (congestive) and diastolic (congestive) heart failure: Secondary | ICD-10-CM | POA: Diagnosis not present

## 2019-07-17 DIAGNOSIS — Z95810 Presence of automatic (implantable) cardiac defibrillator: Secondary | ICD-10-CM

## 2019-07-17 NOTE — Telephone Encounter (Signed)
Remote ICM transmission received.  Attempted call to patient regarding ICM remote transmission and left detailed message per DPR.  Advised to return call for any fluid symptoms or questions. Next ICM remote transmission scheduled 08/21/2019.   ° ° °

## 2019-07-17 NOTE — Progress Notes (Signed)
EPIC Encounter for ICM Monitoring  Patient Name: Diana Campbell is a 74 y.o. female Date: 07/17/2019 Primary Care Physican: Ladon Applebaum Primary Cardiologist:Kelly Electrophysiologist:Croitoru 05/17/2019 Weight: 202.8 lbs   Attempted call to patient and unable to reach. Left detailed message per DPR regarding transmission. Transmission reviewed.  Optivol thoracic impedancenormal.  Prescribed:Furosemide 40 mg take 1 tablet as needed for fluid  Labs: 12/08/2020Creatinine 0.92, BUN 18, Potassium 4.3, Sodium 141, GFR 61-71 06/20/2018 Creatinine 1.02, BUN 18, Potassium 4.6, Sodium 143, GFR 54-63 02/08/2018 Creatinine0.77, BUN13, Potassium4.1, Sodium144, [id number] A complete set of results can be found in Results Review.  Recommendations:Left voice mail with ICM number and encouraged to call if experiencing any fluid symptoms.   Follow-up plan: ICM clinic phone appointment on8/02/2019.91 day device clinic remote transmission7/14/2021.   Copy of ICM check sent to Dr.Croitoru.  3 month ICM trend: 07/17/2019    1 Year ICM trend:       [name], RN 07/17/2019 3:42 PM

## 2019-07-17 NOTE — Progress Notes (Signed)
TY

## 2019-08-02 ENCOUNTER — Ambulatory Visit (INDEPENDENT_AMBULATORY_CARE_PROVIDER_SITE_OTHER): Payer: Medicare PPO | Admitting: *Deleted

## 2019-08-02 DIAGNOSIS — I428 Other cardiomyopathies: Secondary | ICD-10-CM

## 2019-08-02 LAB — CUP PACEART REMOTE DEVICE CHECK
Battery Remaining Longevity: 101 mo
Battery Voltage: 3.01 V
Brady Statistic AP VP Percent: 0.03 %
Brady Statistic AP VS Percent: 86.94 %
Brady Statistic AS VP Percent: 0.01 %
Brady Statistic AS VS Percent: 13.02 %
Brady Statistic RA Percent Paced: 85.91 %
Brady Statistic RV Percent Paced: 0.04 %
Date Time Interrogation Session: 20210714044223
HighPow Impedance: 41 Ohm
HighPow Impedance: 47 Ohm
Implantable Lead Implant Date: 20120702
Implantable Lead Implant Date: 20120702
Implantable Lead Location: 753859
Implantable Lead Location: 753860
Implantable Lead Model: 5076
Implantable Lead Model: 6947
Implantable Pulse Generator Implant Date: 20191011
Lead Channel Impedance Value: 342 Ohm
Lead Channel Impedance Value: 399 Ohm
Lead Channel Impedance Value: 456 Ohm
Lead Channel Pacing Threshold Amplitude: 0.875 V
Lead Channel Pacing Threshold Amplitude: 0.875 V
Lead Channel Pacing Threshold Pulse Width: 0.4 ms
Lead Channel Pacing Threshold Pulse Width: 0.4 ms
Lead Channel Sensing Intrinsic Amplitude: 12.875 mV
Lead Channel Sensing Intrinsic Amplitude: 12.875 mV
Lead Channel Sensing Intrinsic Amplitude: 2.375 mV
Lead Channel Sensing Intrinsic Amplitude: 2.375 mV
Lead Channel Setting Pacing Amplitude: 1.75 V
Lead Channel Setting Pacing Amplitude: 2 V
Lead Channel Setting Pacing Pulse Width: 0.4 ms
Lead Channel Setting Sensing Sensitivity: 0.3 mV

## 2019-08-03 NOTE — Progress Notes (Signed)
Remote ICD transmission.   

## 2019-08-07 ENCOUNTER — Other Ambulatory Visit: Payer: Self-pay | Admitting: Cardiovascular Disease

## 2019-08-08 DIAGNOSIS — Z961 Presence of intraocular lens: Secondary | ICD-10-CM | POA: Diagnosis not present

## 2019-08-08 DIAGNOSIS — H524 Presbyopia: Secondary | ICD-10-CM | POA: Diagnosis not present

## 2019-08-08 DIAGNOSIS — E119 Type 2 diabetes mellitus without complications: Secondary | ICD-10-CM | POA: Diagnosis not present

## 2019-08-08 DIAGNOSIS — Z794 Long term (current) use of insulin: Secondary | ICD-10-CM | POA: Diagnosis not present

## 2019-08-10 DIAGNOSIS — G894 Chronic pain syndrome: Secondary | ICD-10-CM | POA: Diagnosis not present

## 2019-08-10 DIAGNOSIS — R627 Adult failure to thrive: Secondary | ICD-10-CM | POA: Diagnosis not present

## 2019-08-10 DIAGNOSIS — Z6831 Body mass index (BMI) 31.0-31.9, adult: Secondary | ICD-10-CM | POA: Diagnosis not present

## 2019-08-10 DIAGNOSIS — E6609 Other obesity due to excess calories: Secondary | ICD-10-CM | POA: Diagnosis not present

## 2019-08-21 ENCOUNTER — Ambulatory Visit (INDEPENDENT_AMBULATORY_CARE_PROVIDER_SITE_OTHER): Payer: Medicare PPO

## 2019-08-21 DIAGNOSIS — I5042 Chronic combined systolic (congestive) and diastolic (congestive) heart failure: Secondary | ICD-10-CM | POA: Diagnosis not present

## 2019-08-21 DIAGNOSIS — Z95810 Presence of automatic (implantable) cardiac defibrillator: Secondary | ICD-10-CM | POA: Diagnosis not present

## 2019-08-22 NOTE — Progress Notes (Signed)
EPIC Encounter for ICM Monitoring  Patient Name: Diana Campbell is a 74 y.o. female Date: 08/22/2019 Primary Care Physican: Ladon Applebaum Primary Cardiologist:Kelly Electrophysiologist:Croitoru 05/17/2019 Weight: 202.8 lbs   Transmission reviewed.  Optivol thoracic impedancenormal.  Prescribed:Furosemide 40 mg take 1 tablet as needed for fluid  Labs: 12/08/2020Creatinine 0.92, BUN 18, Potassium 4.3, Sodium 141, GFR 61-71 06/20/2018 Creatinine 1.02, BUN 18, Potassium 4.6, Sodium 143, GFR 54-63 02/08/2018 Creatinine0.77, BUN13, Potassium4.1, Sodium144, [id number] A complete set of results can be found in Results Review.  Recommendations:Left voice mail with ICM number and encouraged to call if experiencing any fluid symptoms.   Follow-up plan: ICM clinic phone appointment on 09/21/2019.   91 day device clinic remote transmission 11/01/2019.    EP/Cardiology Office Visits: None at this time.    Copy of ICM check sent to Dr. Royann Shivers.   3 month ICM trend: 08/21/2019    1 Year ICM trend:       [name], RN 08/22/2019 3:19 PM

## 2019-08-28 DIAGNOSIS — E118 Type 2 diabetes mellitus with unspecified complications: Secondary | ICD-10-CM | POA: Diagnosis not present

## 2019-08-28 DIAGNOSIS — Z794 Long term (current) use of insulin: Secondary | ICD-10-CM | POA: Diagnosis not present

## 2019-08-28 DIAGNOSIS — E1165 Type 2 diabetes mellitus with hyperglycemia: Secondary | ICD-10-CM | POA: Diagnosis not present

## 2019-08-28 DIAGNOSIS — E782 Mixed hyperlipidemia: Secondary | ICD-10-CM | POA: Diagnosis not present

## 2019-08-28 LAB — COMPLETE METABOLIC PANEL WITH GFR
AG Ratio: 1.3 (calc) (ref 1.0–2.5)
ALT: 9 U/L (ref 6–29)
AST: 11 U/L (ref 10–35)
Albumin: 4.1 g/dL (ref 3.6–5.1)
Alkaline phosphatase (APISO): 85 U/L (ref 37–153)
BUN: 19 mg/dL (ref 7–25)
CO2: 30 mmol/L (ref 20–32)
Calcium: 9.6 mg/dL (ref 8.6–10.4)
Chloride: 106 mmol/L (ref 98–110)
Creat: 0.9 mg/dL (ref 0.60–0.93)
GFR, Est African American: 72 mL/min/{1.73_m2} (ref >=60)
GFR, Est Non African American: 63 mL/min/{1.73_m2} (ref >=60)
Globulin: 3.1 g/dL (calc) (ref 1.9–3.7)
Glucose, Bld: 188 mg/dL — ABNORMAL HIGH (ref 65–99)
Potassium: 4.7 mmol/L (ref 3.5–5.3)
Sodium: 140 mmol/L (ref 135–146)
Total Bilirubin: 0.5 mg/dL (ref 0.2–1.2)
Total Protein: 7.2 g/dL (ref 6.1–8.1)

## 2019-08-28 LAB — LIPID PANEL
Cholesterol: 219 mg/dL — ABNORMAL HIGH (ref <200)
HDL: 62 mg/dL (ref >=50)
LDL Cholesterol (Calc): 141 mg/dL (calc) — ABNORMAL HIGH
Non-HDL Cholesterol (Calc): 157 mg/dL (calc) — ABNORMAL HIGH (ref <130)
Total CHOL/HDL Ratio: 3.5 (calc) (ref <5.0)
Triglycerides: 65 mg/dL (ref <150)

## 2019-08-28 LAB — TSH: TSH: 1.4 mIU/L (ref 0.40–4.50)

## 2019-08-28 LAB — T4, FREE: Free T4: 1.2 ng/dL (ref 0.8–1.8)

## 2019-08-29 LAB — MICROALBUMIN / CREATININE URINE RATIO
Creatinine, Urine: 173 mg/dL (ref 20–275)
Microalb Creat Ratio: 4 mcg/mg creat (ref <30)
Microalb, Ur: 0.7 mg/dL

## 2019-08-31 ENCOUNTER — Other Ambulatory Visit: Payer: Self-pay

## 2019-08-31 DIAGNOSIS — IMO0002 Reserved for concepts with insufficient information to code with codable children: Secondary | ICD-10-CM

## 2019-08-31 DIAGNOSIS — E1165 Type 2 diabetes mellitus with hyperglycemia: Secondary | ICD-10-CM

## 2019-09-04 ENCOUNTER — Encounter: Payer: Self-pay | Admitting: "Endocrinology

## 2019-09-04 ENCOUNTER — Other Ambulatory Visit: Payer: Self-pay

## 2019-09-04 ENCOUNTER — Ambulatory Visit (INDEPENDENT_AMBULATORY_CARE_PROVIDER_SITE_OTHER): Payer: Medicare PPO | Admitting: "Endocrinology

## 2019-09-04 VITALS — BP 108/64 | HR 64 | Ht 67.0 in | Wt 205.8 lb

## 2019-09-04 DIAGNOSIS — E118 Type 2 diabetes mellitus with unspecified complications: Secondary | ICD-10-CM

## 2019-09-04 DIAGNOSIS — IMO0002 Reserved for concepts with insufficient information to code with codable children: Secondary | ICD-10-CM

## 2019-09-04 DIAGNOSIS — Z794 Long term (current) use of insulin: Secondary | ICD-10-CM | POA: Diagnosis not present

## 2019-09-04 DIAGNOSIS — E1165 Type 2 diabetes mellitus with hyperglycemia: Secondary | ICD-10-CM

## 2019-09-04 LAB — POCT GLYCOSYLATED HEMOGLOBIN (HGB A1C): Hemoglobin A1C: 7.8 % — AB (ref 4.0–5.6)

## 2019-09-04 NOTE — Progress Notes (Signed)
09/04/2019   Endocrinology follow-up note   Subjective:    Patient ID: Diana Campbell, female    DOB: 1945-01-25, PCP Scherrie Bateman   Past Medical History:  Diagnosis Date  . Arthritis   . Automatic implantable cardioverter-defibrillator in situ 07/2010  . Cardiac abnormality    with defibrilator  . Chronic combined systolic and diastolic CHF (congestive heart failure) ([place]) 09/04/2015  . Diabetes mellitus   . H/O cardiac arrest 2006  . Hypercholesteremia   . Hypertension   . Neuropathy    Past Surgical History:  Procedure Laterality Date  . 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  Marland Kitchen CARDIAC CATHETERIZATION  04/14/2000   Normal coronary arteries.  Marland Kitchen CARDIAC DEFIBRILLATOR PLACEMENT  07/21/2010   Medtronic Protecta XT DR, model#[id number], serial#[id number]  . CARDIOVASCULAR STRESS TEST  04/05/2000   Scintigraphic evidence of mild LV dilatation and global LV dysfunction with an EF 41% with mild inferolateral ischemia.  Marland Kitchen CARPAL TUNNEL RELEASE Right 01/23/2016   Procedure: CARPAL TUNNEL RELEASE;  Surgeon: Carole Civil, MD;  Location: [facility];  Service: Orthopedics;  Laterality: Right;  pt knows to arrive at 6:15  . COLONOSCOPY  03/30/2011   Procedure: COLONOSCOPY;  Surgeon: Jamesetta So, MD;  Location: [facility];  Service: Gastroenterology;  Laterality: N/A;  . DORSAL COMPARTMENT RELEASE Right 08/11/2013   Procedure: RELEASE DORSAL COMPARTMENT (DEQUERVAIN);  Surgeon: Carole Civil, MD;  Location: [facility];  Service: Orthopedics;  Laterality: Right;  . ICD GENERATOR CHANGEOUT N/A 10/29/2017   Procedure: ICD GENERATOR CHANGEOUT;  Surgeon: Sanda Klein, MD;  Location: [facility];  Service: Cardiovascular;  Laterality: N/A;  . TRANSTHORACIC ECHOCARDIOGRAM  08/03/2011   EF 45-50%, mildly reduced LV  systolic function.   Social History   Socioeconomic History  . Marital status: Married    Spouse name: Not on file  . Number of children: Not on file  . Years of education: Not on file  . Highest education level: Not on file  Occupational History  . Not on file  Tobacco Use  . Smoking status: Never Smoker  . Smokeless tobacco: Current User    Types: Snuff  . Tobacco comment: 3 dips per day  Vaping Use  . Vaping Use: Never used  Substance and Sexual Activity  . Alcohol use: No  . Drug use: No  . Sexual activity: Yes    Birth control/protection: None  Other Topics Concern  . Not on file  Social History Narrative  . Not on file   Social Determinants of Health   Financial Resource Strain:   . Difficulty of Paying Living Expenses:   Food Insecurity:   . Worried About Charity fundraiser in the Last Year:   . Arboriculturist in the Last Year:   Transportation Needs:   . Film/video editor (Medical):   Marland Kitchen Lack of Transportation (Non-Medical):   Physical Activity:   . Days of Exercise per Week:   . Minutes of Exercise per Session:   Stress:   . Feeling of Stress :   Social Connections:   . Frequency of Communication with Friends and Family:   . Frequency of Social Gatherings with Friends and Family:   . Attends Religious Services:   . Active Member of Clubs or Organizations:   . Attends Archivist Meetings:   .  Marital Status:    Outpatient Encounter Medications as of 09/04/2019  Medication Sig  . atorvastatin (LIPITOR) 20 MG tablet Take 20 mg by mouth daily.  Marland Kitchen ACCU-CHEK AVIVA PLUS test strip USE TO TEST BLOOD SUGAR 4 TIMES A DAY (DX: E11.65)  . ACCU-CHEK FASTCLIX LANCETS MISC 1 each by Does not apply route 2 (two) times daily.  Marland Kitchen acetaminophen (TYLENOL) 325 MG tablet Take 650 mg by mouth 3 (three) times daily.   Marland Kitchen aspirin EC 81 MG tablet Take 1 tablet (81 mg total) by mouth daily.  . carvedilol (COREG) 12.5 MG tablet TAKE 1/2 TABLET BY MOUTH TWICE A  DAY WITH A MEAL  . furosemide (LASIX) 40 MG tablet TAKE 1 TABLET (40 MG TOTAL) BY MOUTH DAILY AS NEEDED FOR FLUID.  . Insulin Lispro Prot & Lispro (HUMALOG MIX 75/25 KWIKPEN) (75-25) 100 UNIT/ML Kwikpen Inject 30-35 Units into the skin 2 (two) times daily before a meal.  . Lancets Misc. (ACCU-CHEK FASTCLIX LANCET) KIT 1 each by Does not apply route 2 (two) times daily.  Marland Kitchen lisinopril (ZESTRIL) 5 MG tablet TAKE 1 TABLET BY MOUTH EVERY MORNING AND 1/2 TABLET AFTERNOON  . [DISCONTINUED] atorvastatin (LIPITOR) 10 MG tablet Take 10 mg by mouth at bedtime.   . [DISCONTINUED] Cholecalciferol (VITAMIN D PO) Take 1 tablet by mouth daily.   No facility-administered encounter medications on file as of 09/04/2019.   ALLERGIES: No Known Allergies VACCINATION STATUS: Immunization History  Administered Date(s) Administered  . Influenza-Unspecified 11/19/2017    Diabetes She is being engaged in telehealth via telephone in follow-up for her currently uncontrolled type 2 diabetes, hyperlipidemia, hypertension.   Onset time: She was diagnosed with diabetes at approximately 74 years of age.   Her disease course has been fluctuating, currently improving.  She did not document any clinically significant hypoglycemia at this time.  Pertinent negatives for hypoglycemia include no confusion, headaches, pallor or seizures. There are no diabetic associated symptoms. Pertinent negatives for diabetes include no chest pain, no polydipsia, no polyphagia and no polyuria. There are no hypoglycemic complications. Symptoms are worsening. Diabetic complications include nephropathy. Risk factors for coronary artery disease include diabetes mellitus, dyslipidemia, hypertension, sedentary lifestyle and tobacco exposure. Current diabetic treatment includes premixed insulin twice daily associated with monitoring of blood glucose at least 2 times a day.    -She presents with controlled fasting and postprandial hyperglycemia, however,  point-of-care A1c 7.8%, improving from 8.3% .  Her weight is stable, she complains of poor appetite. She is following a generally unhealthy diet, regularly eats "junk food".  She has not had a previous visit with a dietitian (She declined a referral to CDE.)   An ACE inhibitor/angiotensin II receptor blocker is being taken. Eye exam is current.    Hyperlipidemia This is a chronic problem. The current episode started more than 1 year ago. Pertinent negatives include no chest pain, myalgias or shortness of breath. Current antihyperlipidemic treatment includes statins. Risk factors for coronary artery disease include dyslipidemia, diabetes mellitus and a sedentary lifestyle.  Hypertension This is a chronic problem. The current episode started more than 1 year ago. The problem is controlled. Pertinent negatives include no chest pain, headaches, palpitations or shortness of breath. Past treatments include ACE inhibitors. Hypertensive end-organ damage includes kidney disease.    Review of systems: Limited as above.   Objective:    BP 108/64   Pulse 64   Ht 5' 7" (1.702 m)   Wt 205 lb 12.8 oz (93.4 kg)  BMI 32.23 kg/m   Wt Readings from Last 3 Encounters:  09/04/19 205 lb 12.8 oz (93.4 kg)  05/12/19 204 lb (92.5 kg)  05/02/19 207 lb 12.8 oz (94.3 kg)     Physical Exam- Limited  Constitutional:  Body mass index is 32.23 kg/m. , not in acute distress, normal state of mind Eyes:  EOMI, no exophthalmos Neck: Supple Thyroid: No gross goiter Respiratory: Adequate breathing efforts Musculoskeletal: no gross deformities, strength intact in all four extremities, no gross restriction of joint movements Skin:  no rashes, no hyperemia Neurological: no tremor with outstretched hands   CMP Latest Ref Rng & Units 08/28/2019 12/27/2018 06/20/2018  Glucose 65 - 99 mg/dL 188(H) 169(H) 111(H)  BUN 7 - 25 mg/dL 19 16 18  Creatinine 0.60 - 0.93 mg/dL 0.90 0.92 1.02(H)  Sodium 135 - 146 mmol/L 140 141  143  Potassium 3.5 - 5.3 mmol/L 4.7 4.3 4.6  Chloride 98 - 110 mmol/L 106 106 108  CO2 20 - 32 mmol/L 30 29 29  Calcium 8.6 - 10.4 mg/dL 9.6 9.5 9.8  Total Protein 6.1 - 8.1 g/dL 7.2 6.7 6.9  Total Bilirubin 0.2 - 1.2 mg/dL 0.5 0.4 0.3  Alkaline Phos 38 - 126 U/L - - -  AST 10 - 35 U/L 11 13 15  ALT 6 - 29 U/L 9 7 9    Lab Results  Component Value Date   HGBA1C 7.8 (A) 09/04/2019   HGBA1C 8.3 (A) 05/02/2019   HGBA1C 7.8 (H) 12/27/2018   Lipid Panel     Component Value Date/Time   CHOL 219 (H) 08/28/2019 0851   TRIG 65 08/28/2019 0851   HDL 62 08/28/2019 0851   CHOLHDL 3.5 08/28/2019 0851   VLDL 14 09/26/2015 0856   LDLCALC 141 (H) 08/28/2019 0851     Assessment & Plan:   1. Type 2 diabetes mellitus with long-term current use of insulin (HCC)  -Her diabetes is complicated by cardiomyopathy and she remains at a high risk for more acute and chronic complications of diabetes which include CAD, CVA, CKD, retinopathy, and neuropathy. These are all discussed in detail with the patient.  Patient presents with near target glycemic profile both fasting and postprandial.  Her point-of-care A1c 7.8% improving from 8.3%.  She feels better, no hypoglycemia.    Recent labs reviewed.   - I have re-counseled the patient on diet management and weight loss  by adopting a carbohydrate restricted / protein rich  Diet. - she  admits there is a room for improvement in her diet and drink choices. -  Suggestion is made for her to avoid simple carbohydrates  from her diet including Cakes, Sweet Desserts / Pastries, Ice Cream, Soda (diet and regular), Sweet Tea, Candies, Chips, Cookies, Sweet Pastries,  Store Bought Juices, Alcohol in Excess of  1-2 drinks a day, Artificial Sweeteners, Coffee Creamer, and "Sugar-free" Products. This will help patient to have stable blood glucose profile and potentially avoid unintended weight gain.   - Patient is advised to stick to a routine mealtimes to eat 3  meals  a day and avoid unnecessary snacks ( to snack only to correct hypoglycemia).  - The patient  declined referral to a  CDE for individualized DM education.  - I have approached patient with the following individualized plan to manage diabetes and patient agrees.   - Patient  worries a lot about hypoglycemia, she changes around her insulin doses against recommendations.   -She has done relatively better   on her current premixed insulin regimen.  She is advised to continue Humalog 75/25  35 units with breakfast and  30 units with supper for pre-meal blood glucose readings above 90 mg/dL, associated with monitoring of blood glucose 4 times a day-before meals and at bedtime.    -Patient is encouraged to call clinic for blood glucose levels less than 70 or above 200 mg /dl.  -Her labs show her renal function is improving. She would have benefited from metformin however she reports intolerance to metformin.   - Patient specific target  for A1c; LDL, HDL, Triglycerides, and  Waist Circumference were discussed in detail.  2) BP/HTN:  -Her blood pressure is controlled to target.   She is advised to continue her current blood pressure medications including lisinopril 7.5 mg p.o. daily.    3) Lipids/HPL: Recent lipid panel showed controlled LDL at 64.  She is advised to continue atorvastatin 10 mg p.o. nightly.    Side effects and precautions discussed with her.    4)  Weight/Diet: Her BMI 32.5, a candidate for modest weight loss.  She declined CDE consult . exercise, and carbohydrates information provided.  5) Chronic Care/Health Maintenance:  -Patient is on ACEI/ARB and Statin medications and encouraged to continue to follow up with Ophthalmology, Podiatrist at least yearly or according to recommendations, and advised to  stay away from smoking. I have recommended yearly flu vaccine and pneumonia vaccination at least every 5 years; moderate intensity exercise for up to 150 minutes weekly; and   sleep for at least 7 hours a day.   - I advised patient to maintain close follow up with Jake Samples, PA-C for primary care needs.  - Time spent on this patient care encounter:  35 min, of which > 50% was spent in  counseling and the rest reviewing her blood glucose logs , discussing her hypoglycemia and hyperglycemia episodes, reviewing her current and  previous labs / studies  ( including abstraction from other facilities) and medications  doses and developing a  long term treatment plan and documenting her care.   Please refer to Patient Instructions for Blood Glucose Monitoring and Insulin/Medications Dosing Guide"  in media tab for additional information. Please  also refer to " Patient Self Inventory" in the Media  tab for reviewed elements of pertinent patient history.  Tyreka Golda Acre participated in the discussions, expressed understanding, and voiced agreement with the above plans.  All questions were answered to her satisfaction. she is encouraged to contact clinic should she have any questions or concerns prior to her return visit.    Follow up plan: -Return in about 4 months (around 01/04/2020) for Bring Meter and Logs- A1c in Office.  Glade Lloyd, MD Phone: [phone]  Fax: [phone]  -  This note was partially dictated with voice recognition software. Similar sounding words can be transcribed inadequately or may not  be corrected upon review.  09/04/2019, 6:11 PM

## 2019-09-04 NOTE — Patient Instructions (Signed)

## 2019-09-15 ENCOUNTER — Other Ambulatory Visit: Payer: Self-pay | Admitting: "Endocrinology

## 2019-09-21 ENCOUNTER — Ambulatory Visit (INDEPENDENT_AMBULATORY_CARE_PROVIDER_SITE_OTHER): Payer: Medicare PPO

## 2019-09-21 DIAGNOSIS — I5042 Chronic combined systolic (congestive) and diastolic (congestive) heart failure: Secondary | ICD-10-CM

## 2019-09-21 DIAGNOSIS — Z95810 Presence of automatic (implantable) cardiac defibrillator: Secondary | ICD-10-CM

## 2019-09-22 NOTE — Progress Notes (Signed)
Thanks. Have a good holiday weekend!

## 2019-09-22 NOTE — Progress Notes (Signed)
EPIC Encounter for ICM Monitoring  Patient Name: Diana Campbell is a 74 y.o. female Date: 09/22/2019 Primary Care Physican: Avis Epley, PA-C Primary Cardiologist:Kelly Electrophysiologist:Croitoru 09/22/2019 Weight: 202 lbs   Spoke with patient and reports feeling well at this time.  Denies fluid symptoms.    Optivol thoracic impedancenormal.  Prescribed:Furosemide 40 mg take 1 tablet as needed for fluid  Labs: 12/08/2020Creatinine 0.92, BUN 18, Potassium 4.3, Sodium 141, GFR 61-71 06/20/2018 Creatinine 1.02, BUN 18, Potassium 4.6, Sodium 143, GFR 54-63 02/08/2018 Creatinine0.77, BUN13, Potassium4.1, Sodium144, [id number] A complete set of results can be found in Results Review.  Recommendations: No changes and encouraged to call if experiencing any fluid symptoms.  Follow-up plan: ICM clinic phone appointment on 10/23/2019.   91 day device clinic remote transmission 11/01/2019.    EP/Cardiology Office Visits: None at this time.    Copy of ICM check sent to Dr. Royann Shivers.   3 month ICM trend: 09/21/2019    1 Year ICM trend:       [name], RN 09/22/2019 3:29 PM

## 2019-09-26 ENCOUNTER — Other Ambulatory Visit: Payer: Self-pay | Admitting: Family Medicine

## 2019-09-26 DIAGNOSIS — Z1231 Encounter for screening mammogram for malignant neoplasm of breast: Secondary | ICD-10-CM

## 2019-10-23 ENCOUNTER — Ambulatory Visit (INDEPENDENT_AMBULATORY_CARE_PROVIDER_SITE_OTHER): Payer: Medicare PPO

## 2019-10-23 ENCOUNTER — Telehealth: Payer: Self-pay

## 2019-10-23 DIAGNOSIS — I5042 Chronic combined systolic (congestive) and diastolic (congestive) heart failure: Secondary | ICD-10-CM

## 2019-10-23 DIAGNOSIS — Z95810 Presence of automatic (implantable) cardiac defibrillator: Secondary | ICD-10-CM

## 2019-10-23 NOTE — Telephone Encounter (Signed)
Remote ICM transmission received.  Attempted call to patient regarding ICM remote transmission and no answer or answering machine. 

## 2019-10-23 NOTE — Progress Notes (Signed)
EPIC Encounter for ICM Monitoring  Patient Name: Diana Campbell is a 74 y.o. female Date: 10/23/2019 Primary Care Physican: Avis Epley, PA-C Primary Cardiologist:Kelly Electrophysiologist:Croitoru 09/22/2019 Weight: 202 lbs   Attempted call to patient and unable to reach.   Transmission reviewed.   Optivol thoracic impedancepossible fluid accumulation since 10/06/2019.  Prescribed:Furosemide 40 mg take 1 tablet as needed for fluid  Labs: 08/28/2019 Creatinine 0.90, BUN 19, Potassium 4.7, Sodium 140, GFR 63-72 A complete set of results can be found in Results Review.  Recommendations: Unable to reach.  Will advise patient to take PRN Furosemide if reached as prescribed.  Follow-up plan: ICM clinic phone appointment on10/11/2019 to recheck fluid levels. 91 day device clinic remote transmission 11/01/2019.   EP/Cardiology Office Visits: Pt due to schedule a 6 month appointment with Dr Tresa Endo in October and was due to schedule a yearly appointment with Dr Royann Shivers 01/2019  Copy of ICM check sent to Dr.Croitoru and Dr Tresa Endo for review.   3 month ICM trend: 10/23/2019    1 Year ICM trend:       [name], RN 10/23/2019 2:27 PM

## 2019-10-23 NOTE — Progress Notes (Signed)
Agree that it looks like she is gaining fluid. Will also try to reach.

## 2019-10-28 ENCOUNTER — Other Ambulatory Visit: Payer: Self-pay | Admitting: "Endocrinology

## 2019-10-30 ENCOUNTER — Telehealth: Payer: Self-pay

## 2019-10-30 ENCOUNTER — Other Ambulatory Visit: Payer: Self-pay

## 2019-10-30 ENCOUNTER — Ambulatory Visit (INDEPENDENT_AMBULATORY_CARE_PROVIDER_SITE_OTHER): Payer: Medicare PPO

## 2019-10-30 DIAGNOSIS — IMO0002 Reserved for concepts with insufficient information to code with codable children: Secondary | ICD-10-CM

## 2019-10-30 DIAGNOSIS — I5042 Chronic combined systolic (congestive) and diastolic (congestive) heart failure: Secondary | ICD-10-CM

## 2019-10-30 DIAGNOSIS — Z95810 Presence of automatic (implantable) cardiac defibrillator: Secondary | ICD-10-CM

## 2019-10-30 DIAGNOSIS — E1165 Type 2 diabetes mellitus with hyperglycemia: Secondary | ICD-10-CM

## 2019-10-30 MED ORDER — ACCU-CHEK GUIDE VI STRP: ORAL_STRIP | 1 refills | Status: DC

## 2019-10-30 MED ORDER — ACCU-CHEK GUIDE W/DEVICE KIT: PACK | 0 refills | Status: AC

## 2019-10-30 NOTE — Progress Notes (Signed)
Thanks

## 2019-10-30 NOTE — Progress Notes (Signed)
EPIC Encounter for ICM Monitoring  Patient Name: Diana Campbell is a 74 y.o. female Date: 10/30/2019 Primary Care Physican: Ladon Applebaum Primary Cardiologist:Kelly Electrophysiologist:Croitoru 09/22/2019 Weight: 202lbs   Attempted call to patient and unable to reach.   Transmission reviewed.   Optivol thoracic impedancepossible fluid accumulation since 10/06/2019.  Prescribed:Furosemide 40 mg take 1 tablet as needed for fluid  Labs: 08/28/2019 Creatinine 0.90, BUN 19, Potassium 4.7, Sodium 140, GFR 63-72 A complete set of results can be found in Results Review.  Recommendations:Unable to reach.  Will advise patient to take PRN Furosemide if reached as prescribed.  Follow-up plan: ICM clinic phone appointment on10/25/2021 to recheck fluid levels. 91 day device clinic remote transmission 11/01/2019.   EP/Cardiology Office Visits: Pt due to schedule a 6 month appointment with Dr Tresa Endo in October and was due to schedule a yearly appointment with Dr Royann Shivers 01/2019  Copy of ICM check sent to Dr.Croitoru and Dr Tresa Endo for review.  3 month ICM trend: 10/30/2019    1 Year ICM trend:       [name], RN 10/30/2019 10:58 AM

## 2019-10-30 NOTE — Telephone Encounter (Signed)
Remote ICM transmission received.  Attempted call to patient regarding ICM remote transmission and line is busy.

## 2019-10-30 NOTE — Telephone Encounter (Signed)
Remote ICM transmission received.  Attempted call to patient regarding ICM remote transmission and left detailed message per DPR to return call.  Advised to return call for any fluid symptoms or questions.      

## 2019-11-01 ENCOUNTER — Ambulatory Visit (INDEPENDENT_AMBULATORY_CARE_PROVIDER_SITE_OTHER): Payer: Medicare PPO

## 2019-11-01 DIAGNOSIS — I428 Other cardiomyopathies: Secondary | ICD-10-CM | POA: Diagnosis not present

## 2019-11-01 LAB — CUP PACEART REMOTE DEVICE CHECK
Battery Remaining Longevity: 98 mo
Battery Voltage: 2.99 V
Brady Statistic AP VP Percent: 0.04 %
Brady Statistic AP VS Percent: 88.11 %
Brady Statistic AS VP Percent: 0 %
Brady Statistic AS VS Percent: 11.85 %
Brady Statistic RA Percent Paced: 85.39 %
Brady Statistic RV Percent Paced: 0.04 %
Date Time Interrogation Session: 20211013003903
HighPow Impedance: 41 Ohm
HighPow Impedance: 53 Ohm
Implantable Lead Implant Date: 20120702
Implantable Lead Implant Date: 20120702
Implantable Lead Location: 753859
Implantable Lead Location: 753860
Implantable Lead Model: 5076
Implantable Lead Model: 6947
Implantable Pulse Generator Implant Date: 20191011
Lead Channel Impedance Value: 380 Ohm
Lead Channel Impedance Value: 437 Ohm
Lead Channel Impedance Value: 456 Ohm
Lead Channel Pacing Threshold Amplitude: 0.875 V
Lead Channel Pacing Threshold Amplitude: 0.875 V
Lead Channel Pacing Threshold Pulse Width: 0.4 ms
Lead Channel Pacing Threshold Pulse Width: 0.4 ms
Lead Channel Sensing Intrinsic Amplitude: 11.625 mV
Lead Channel Sensing Intrinsic Amplitude: 11.625 mV
Lead Channel Sensing Intrinsic Amplitude: 2.5 mV
Lead Channel Sensing Intrinsic Amplitude: 2.5 mV
Lead Channel Setting Pacing Amplitude: 1.75 V
Lead Channel Setting Pacing Amplitude: 2 V
Lead Channel Setting Pacing Pulse Width: 0.4 ms
Lead Channel Setting Sensing Sensitivity: 0.3 mV

## 2019-11-02 ENCOUNTER — Other Ambulatory Visit: Payer: Self-pay

## 2019-11-02 ENCOUNTER — Ambulatory Visit
Admission: RE | Admit: 2019-11-02 | Discharge: 2019-11-02 | Disposition: A | Discharge status: Home / Self Care | Payer: Medicare PPO | Source: Ambulatory Visit | Attending: Family Medicine | Admitting: Family Medicine

## 2019-11-02 DIAGNOSIS — Z1231 Encounter for screening mammogram for malignant neoplasm of breast: Secondary | ICD-10-CM

## 2019-11-06 ENCOUNTER — Telehealth: Payer: Self-pay

## 2019-11-06 NOTE — Progress Notes (Signed)
Remote ICD transmission.   

## 2019-11-06 NOTE — Telephone Encounter (Signed)
Attempted ICM return call as requested by voice mail message.  Recording states to enter a remote access code and unable to leave message.

## 2019-11-08 ENCOUNTER — Other Ambulatory Visit: Payer: Self-pay | Admitting: Cardiovascular Disease

## 2019-11-13 ENCOUNTER — Ambulatory Visit (INDEPENDENT_AMBULATORY_CARE_PROVIDER_SITE_OTHER): Payer: Medicare PPO

## 2019-11-13 DIAGNOSIS — Z95810 Presence of automatic (implantable) cardiac defibrillator: Secondary | ICD-10-CM

## 2019-11-13 DIAGNOSIS — I5042 Chronic combined systolic (congestive) and diastolic (congestive) heart failure: Secondary | ICD-10-CM

## 2019-11-17 NOTE — Progress Notes (Signed)
EPIC Encounter for ICM Monitoring  Patient Name: BLAISE PALLADINO is a 74 y.o. female Date: 11/17/2019 Primary Care Physican: Ladon Applebaum Primary Cardiologist:Kelly Electrophysiologist:Croitoru 09/22/2019 Weight: 202lbs   Transmission reviewed.  Optivol thoracic impedance suggesting fluid levels have returned close to normal.  Prescribed:Furosemide 40 mg take 1 tablet as needed for fluid  Labs: 08/28/2019 Creatinine0.90, BUN19, Potassium4.7, Sodium140, [id number] A complete set of results can be found in Results Review.  Recommendations:No changes  Follow-up plan: ICM clinic phone appointment on11/16/2021. 91 day device clinic remote transmission 01/31/2020.   EP/Cardiology Office Visits: Pt due to schedule a 6 month appointment with Dr Tresa Endo in October. Last visit with Dr Royann Shivers was 02/09/2018 and overdue to schedule an appointment.  Copy of ICM check sent to Dr.Croitoru.   3 month ICM trend: 11/13/2019    1 Year ICM trend:       [name], RN 11/17/2019 9:36 AM

## 2019-11-21 ENCOUNTER — Other Ambulatory Visit: Payer: Self-pay | Admitting: "Endocrinology

## 2019-11-29 ENCOUNTER — Telehealth: Payer: Self-pay

## 2019-11-29 NOTE — Telephone Encounter (Signed)
Pt called back and states her pharmacy has everything she needs.

## 2019-11-29 NOTE — Telephone Encounter (Signed)
Discussed with pt to take her Humalog 75/25 35 units at breakfast and 30 units at supper only if her BG is greater than 90 per Dr.Nida's last office note. Advised pt that we refilled her Humalog 75/25 on 11/2 and to check with her pharmacy and to call us back if she needed anything. Understanding voiced per pt.

## 2019-11-29 NOTE — Telephone Encounter (Signed)
Patient said she is running out of her insulin too fast. She said she does not have enough for her evening dose. Can you call this pt. I advised her that it was sent in on 11/2

## 2019-12-05 ENCOUNTER — Ambulatory Visit (INDEPENDENT_AMBULATORY_CARE_PROVIDER_SITE_OTHER): Payer: Medicare PPO

## 2019-12-05 ENCOUNTER — Telehealth: Payer: Self-pay

## 2019-12-05 DIAGNOSIS — I5042 Chronic combined systolic (congestive) and diastolic (congestive) heart failure: Secondary | ICD-10-CM

## 2019-12-05 DIAGNOSIS — Z95810 Presence of automatic (implantable) cardiac defibrillator: Secondary | ICD-10-CM | POA: Diagnosis not present

## 2019-12-05 NOTE — Progress Notes (Signed)
EPIC Encounter for ICM Monitoring  Patient Name: Diana Campbell is a 74 y.o. female Date: 12/05/2019 Primary Care Physican: Ladon Applebaum Primary Cardiologist:Kelly Electrophysiologist:Croitoru 09/22/2019 Weight: 202lbs   Attempted call to patient and unable to reach.  Left detaiiled message per DPR regarding transmission. Transmission reviewed.   Optivol thoracic impedance suggesting possible fluid accumulation since 10/06/2019 with the exception of approximately a week close to baseline.  Prescribed:Furosemide 40 mg take 1 tablet as needed for fluid  Labs: 08/28/2019 Creatinine0.90, BUN19, Potassium4.7, Sodium140, [id number] A complete set of results can be found in Results Review.  Recommendations:Unable to reach and left message to return call.    Follow-up plan: ICM clinic phone appointment on11/19/2021 (manual) to recheck fluid levels. 91 day device clinic remote transmission 01/31/2020.   EP/Cardiology Office Visits: Pt due to schedule a 6 month appointment with Dr Tresa Endo in 10/2019. Last visit with Dr Royann Shivers was 02/09/2018 and overdue to schedule an appointment.  Copy of ICM check sent to Dr.Croitoru and Dr Tresa Endo.    3 month ICM trend: 12/05/2019    1 Year ICM trend:       [name], RN 12/05/2019 11:06 AM

## 2019-12-05 NOTE — Telephone Encounter (Signed)
Remote ICM transmission received.  Attempted call to patient regarding ICM remote transmission and left detailed message per DPR to return call.  Advised to return call for any fluid symptoms or questions.      

## 2019-12-06 NOTE — Progress Notes (Signed)
Spoke with patient and she reports eft ankle swollen during the day but resolves after sleeping at night.  She does feel tired.  Most recent weight is 209 lbs which is above her baseline of 203-205 lbs.  Advised to take 1 Furosemide 40 mg daily x 4 days and then return to PRN.  Will check fluid levels on 12/11/2019.  Discussed limiting salt and fluid.  Reviewed her diet and she does not follow low salt diet and drinking > 64 oz daily.  Explained importance of limiting salt intake to 2000 mg daily and fluid intake 64 oz daily.    Copy sent to Dr Tresa Endo and Dr Royann Shivers for review of recommendations.  Provided patient with [facility] office number and encouraged to call today to schedule overdue appointments with Dr Tresa Endo and Dr Royann Shivers.

## 2019-12-06 NOTE — Progress Notes (Signed)
Thanks, agree with diuretic dose adjustment

## 2019-12-11 ENCOUNTER — Telehealth: Payer: Self-pay

## 2019-12-11 ENCOUNTER — Ambulatory Visit (INDEPENDENT_AMBULATORY_CARE_PROVIDER_SITE_OTHER): Payer: Medicare PPO

## 2019-12-11 DIAGNOSIS — I5042 Chronic combined systolic (congestive) and diastolic (congestive) heart failure: Secondary | ICD-10-CM

## 2019-12-11 DIAGNOSIS — Z95810 Presence of automatic (implantable) cardiac defibrillator: Secondary | ICD-10-CM

## 2019-12-11 NOTE — Progress Notes (Signed)
EPIC Encounter for ICM Monitoring  Patient Name: Diana Campbell is a 74 y.o. female Date: 12/11/2019 Primary Care Physican: Ladon Applebaum Primary Cardiologist:Kelly Electrophysiologist:Croitoru 09/22/2019 Weight: 202lbs   Attempted call to patient and unable to reach.  Left detaiiled message per DPR regarding transmission. Transmission reviewed.   Optivol thoracic impedancesuggesting fluid levels returned to normal after taking 4 days of PRN Furosemide  Prescribed:Furosemide 40 mg take 1 tablet as needed for fluid  Labs: 08/28/2019 Creatinine0.90, BUN19, Potassium4.7, Sodium140, [id number] A complete set of results can be found in Results Review.  Recommendations:Left voice mail with ICM number and encouraged to call if experiencing any fluid symptoms.  Follow-up plan: ICM clinic phone appointment on12/27/2021. 91 day device clinic remote transmission1/12/2020.   EP/Cardiology Office Visits:03/04/2020 with Dr Royann Shivers.  Patient has office number to call and make 6 month appointment with Dr Tresa Endo in 10/2019.   Copy of ICM check sent to Dr.Croitoru.    3 month ICM trend: 12/11/2019    1 Year ICM trend:       [name], RN 12/11/2019 4:45 PM

## 2019-12-11 NOTE — Telephone Encounter (Signed)
Remote ICM transmission received.  Attempted call to patient regarding ICM remote transmission and left detailed message per DPR.  Advised to return call for any fluid symptoms or questions.  

## 2019-12-12 NOTE — Progress Notes (Signed)
Great. Good to have her back to baseline before Thanksgiving dinner.

## 2020-01-04 ENCOUNTER — Ambulatory Visit (INDEPENDENT_AMBULATORY_CARE_PROVIDER_SITE_OTHER): Payer: Medicare PPO | Admitting: "Endocrinology

## 2020-01-04 ENCOUNTER — Other Ambulatory Visit: Payer: Self-pay

## 2020-01-04 ENCOUNTER — Other Ambulatory Visit: Payer: Self-pay | Admitting: "Endocrinology

## 2020-01-04 ENCOUNTER — Encounter: Payer: Self-pay | Admitting: "Endocrinology

## 2020-01-04 VITALS — BP 120/66 | HR 60 | Ht 67.0 in | Wt 200.0 lb

## 2020-01-04 DIAGNOSIS — E118 Type 2 diabetes mellitus with unspecified complications: Secondary | ICD-10-CM | POA: Diagnosis not present

## 2020-01-04 DIAGNOSIS — E1165 Type 2 diabetes mellitus with hyperglycemia: Secondary | ICD-10-CM

## 2020-01-04 DIAGNOSIS — I10 Essential (primary) hypertension: Secondary | ICD-10-CM | POA: Diagnosis not present

## 2020-01-04 DIAGNOSIS — E782 Mixed hyperlipidemia: Secondary | ICD-10-CM | POA: Diagnosis not present

## 2020-01-04 DIAGNOSIS — Z794 Long term (current) use of insulin: Secondary | ICD-10-CM | POA: Diagnosis not present

## 2020-01-04 DIAGNOSIS — IMO0002 Reserved for concepts with insufficient information to code with codable children: Secondary | ICD-10-CM

## 2020-01-04 LAB — HEMOGLOBIN A1C: Hemoglobin A1C: 7.3

## 2020-01-04 NOTE — Progress Notes (Signed)
01/04/2020  Endocrinology follow-up note   Subjective:    Patient ID: Diana Campbell, female    DOB: 76-06-27-1947, PCP Scherrie Bateman   Past Medical History:  Diagnosis Date  . Arthritis   . Automatic implantable cardioverter-defibrillator in situ 07/2010  . Cardiac abnormality    with defibrilator  . Chronic combined systolic and diastolic CHF (congestive heart failure) ([place]) 09/04/2015  . Diabetes mellitus   . H/O cardiac arrest 2006  . Hypercholesteremia   . Hypertension   . Neuropathy    Past Surgical History:  Procedure Laterality Date  . 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  Marland Kitchen CARDIAC CATHETERIZATION  04/14/2000   Normal coronary arteries.  Marland Kitchen CARDIAC DEFIBRILLATOR PLACEMENT  07/21/2010   Medtronic Protecta XT DR, model#[id number], serial#[id number]  . CARDIOVASCULAR STRESS TEST  04/05/2000   Scintigraphic evidence of mild LV dilatation and global LV dysfunction with an EF 41% with mild inferolateral ischemia.  Marland Kitchen CARPAL TUNNEL RELEASE Right 01/23/2016   Procedure: CARPAL TUNNEL RELEASE;  Surgeon: Carole Civil, MD;  Location: [facility];  Service: Orthopedics;  Laterality: Right;  pt knows to arrive at 6:15  . COLONOSCOPY  03/30/2011   Procedure: COLONOSCOPY;  Surgeon: Jamesetta So, MD;  Location: [facility];  Service: Gastroenterology;  Laterality: N/A;  . DORSAL COMPARTMENT RELEASE Right 08/11/2013   Procedure: RELEASE DORSAL COMPARTMENT (DEQUERVAIN);  Surgeon: Carole Civil, MD;  Location: [facility];  Service: Orthopedics;  Laterality: Right;  . ICD GENERATOR CHANGEOUT N/A 10/29/2017   Procedure: ICD GENERATOR CHANGEOUT;  Surgeon: Sanda Klein, MD;  Location: [place] [facility];  Service: Cardiovascular;  Laterality: N/A;  . TRANSTHORACIC ECHOCARDIOGRAM  08/03/2011   EF 45-50%, mildly reduced LV  systolic function.   Social History   Socioeconomic History  . Marital status: Married    Spouse name: Not on file  . Number of children: Not on file  . Years of education: Not on file  . Highest education level: Not on file  Occupational History  . Not on file  Tobacco Use  . Smoking status: Never Smoker  . Smokeless tobacco: Current User    Types: Snuff  . Tobacco comment: 3 dips per day  Vaping Use  . Vaping Use: Never used  Substance and Sexual Activity  . Alcohol use: No  . Drug use: No  . Sexual activity: Yes    Birth control/protection: None  Other Topics Concern  . Not on file  Social History Narrative  . Not on file   Social Determinants of Health   Financial Resource Strain: Not on file  Food Insecurity: Not on file  Transportation Needs: Not on file  Physical Activity: Not on file  Stress: Not on file  Social Connections: Not on file   Outpatient Encounter Medications as of 01/04/2020  Medication Sig  . ACCU-CHEK FASTCLIX LANCETS MISC 1 each by Does not apply route 2 (two) times daily.  Marland Kitchen acetaminophen (TYLENOL) 325 MG tablet Take 650 mg by mouth 3 (three) times daily.   Marland Kitchen aspirin EC 81 MG tablet Take 1 tablet (81 mg total) by mouth daily.  Marland Kitchen atorvastatin (LIPITOR) 20 MG tablet Take 20 mg by mouth daily.  . Blood Glucose Monitoring Suppl (ACCU-CHEK GUIDE) w/Device KIT Use as directed to check blood glucose four times daily  . carvedilol (COREG) 12.5 MG tablet TAKE  1/2 TABLET BY MOUTH TWICE A DAY WITH A MEAL  . furosemide (LASIX) 40 MG tablet TAKE 1 TABLET (40 MG TOTAL) BY MOUTH DAILY AS NEEDED FOR FLUID.  Marland Kitchen glucose blood (ACCU-CHEK GUIDE) test strip Use as instructed to test blood glucose four times daily  . HUMALOG MIX 75/25 KWIKPEN (75-25) 100 UNIT/ML Kwikpen INJECT 30-35 UNITS INTO THE SKIN 2 (TWO) TIMES DAILY BEFORE A MEAL.  Marland Kitchen Lancets Misc. (ACCU-CHEK FASTCLIX LANCET) KIT 1 each by Does not apply route 2 (two) times daily.  Marland Kitchen lisinopril (ZESTRIL) 5 MG  tablet TAKE 1 TABLET BY MOUTH EVERY MORNING AND 1/2 TABLET AFTERNOON   No facility-administered encounter medications on file as of 01/04/2020.   ALLERGIES: No Known Allergies VACCINATION STATUS: Immunization History  Administered Date(s) Administered  . Influenza-Unspecified 11/19/2017    Diabetes She is returns for follow-up in the management of her currently uncontrolled type 2 diabetes, hyperlipidemia, hypertension.   Onset time: She was diagnosed with diabetes at approximately 74 years of age.   Her disease course has been fluctuating, currently improving with point-of-care A1c of 7.3%, on premixed insulin twice a day.    Pertinent negatives for hypoglycemia include no confusion, headaches, pallor or seizures. There are no diabetic associated symptoms. Pertinent negatives for diabetes include no chest pain, no polydipsia, no polyphagia and no polyuria. There are no hypoglycemic complications. Symptoms are worsening. Diabetic complications include nephropathy. Risk factors for coronary artery disease include diabetes mellitus, dyslipidemia, hypertension, sedentary lifestyle and tobacco exposure. Current diabetic treatment includes premixed insulin twice daily associated with monitoring of blood glucose at least 2 times a day.    -She presents with controlled fasting glycemic profile both fasting and postprandial, point-of-care A1c of 7.3% overall improving from 8.3%.    Her weight is stable, she complains of poor appetite. She is following a generally unhealthy diet, regularly eats "junk food".  She has not had a previous visit with a dietitian (She declined a referral to CDE.)   An ACE inhibitor/angiotensin II receptor blocker is being taken. Eye exam is current.    Hyperlipidemia This is a chronic problem. The current episode started more than 1 year ago. Pertinent negatives include no chest pain, myalgias or shortness of breath. Current antihyperlipidemic treatment includes statins.  Risk factors for coronary artery disease include dyslipidemia, diabetes mellitus and a sedentary lifestyle.  Hypertension This is a chronic problem. The current episode started more than 1 year ago. The problem is controlled. Pertinent negatives include no chest pain, headaches, palpitations or shortness of breath. Past treatments include ACE inhibitors. Hypertensive end-organ damage includes kidney disease.    Review of systems: Limited as above.   Objective:    BP 120/66   Pulse 60   Ht 5' 7"  (1.702 m)   Wt 200 lb (90.7 kg)   BMI 31.32 kg/m   Wt Readings from Last 3 Encounters:  01/04/20 200 lb (90.7 kg)  09/04/19 205 lb 12.8 oz (93.4 kg)  05/12/19 204 lb (92.5 kg)     Physical Exam- Limited  Constitutional:  Body mass index is 31.32 kg/m. , not in acute distress, normal state of mind Eyes:  EOMI, no exophthalmos Neck: Supple Thyroid: No gross goiter Respiratory: Adequate breathing efforts Musculoskeletal: + Walks with a cane for disequilibrium, no gross deformities, strength intact in all four extremities, no gross restriction of joint movements Skin:  no rashes, no hyperemia Neurological: no tremor with outstretched hands   CMP Latest Ref Rng & Units 08/28/2019 12/27/2018  06/20/2018  Glucose 65 - 99 mg/dL 188(H) 169(H) 111(H)  BUN 7 - 25 mg/dL 19 16 18   Creatinine 0.60 - 0.93 mg/dL 0.90 0.92 1.02(H)  Sodium 135 - 146 mmol/L 140 141 143  Potassium 3.5 - 5.3 mmol/L 4.7 4.3 4.6  Chloride 98 - 110 mmol/L 106 106 108  CO2 20 - 32 mmol/L 30 29 29   Calcium 8.6 - 10.4 mg/dL 9.6 9.5 9.8  Total Protein 6.1 - 8.1 g/dL 7.2 6.7 6.9  Total Bilirubin 0.2 - 1.2 mg/dL 0.5 0.4 0.3  Alkaline Phos 38 - 126 U/L - - -  AST 10 - 35 U/L 11 13 15   ALT 6 - 29 U/L 9 7 9     Lab Results  Component Value Date   HGBA1C 7.8 (A) 09/04/2019   HGBA1C 8.3 (A) 05/02/2019   HGBA1C 7.8 (H) 12/27/2018   Lipid Panel     Component Value Date/Time   CHOL 219 (H) 08/28/2019 0851   TRIG 65 08/28/2019  0851   HDL 62 08/28/2019 0851   CHOLHDL 3.5 08/28/2019 0851   VLDL 14 09/26/2015 0856   LDLCALC 141 (H) 08/28/2019 0851     Assessment & Plan:   1. Type 2 diabetes mellitus with long-term current use of insulin (HCC)  -Her diabetes is complicated by cardiomyopathy and she remains at a high risk for more acute and chronic complications of diabetes which include CAD, CVA, CKD, retinopathy, and neuropathy. These are all discussed in detail with the patient.  Patient presents with near target glycemic profile fasting and postprandial.  Her point-of-care A1c 7.3% improving from 8.2%, no hypoglycemia reported or documented.     Recent labs reviewed.   - I have re-counseled the patient on diet management and weight loss  by adopting a carbohydrate restricted / protein rich  Diet.  - she acknowledges that there is a room for improvement in her food and drink choices. - Suggestion is made for her to avoid simple carbohydrates  from her diet including Cakes, Sweet Desserts, Ice Cream, Soda (diet and regular), Sweet Tea, Candies, Chips, Cookies, Store Bought Juices, Alcohol in Excess of  1-2 drinks a day, Artificial Sweeteners,  Coffee Creamer, and "Sugar-free" Products, Lemonade. This will help patient to have more stable blood glucose profile and potentially avoid unintended weight gain.   - Patient is advised to stick to a routine mealtimes to eat 3 meals  a day and avoid unnecessary snacks ( to snack only to correct hypoglycemia).  - The patient  declined referral to a  CDE for individualized DM education.  - I have approached patient with the following individualized plan to manage diabetes and patient agrees.   - Patient  worries a lot about hypoglycemia, she changes around her insulin doses against recommendations.   -She has done relatively better on her current premixed insulin regimen.  She is advised to continue Humalog 75/25 35 units with NAC Sustain 30 units with supper, for  premeal blood glucose readings above 90 mg per DL.   -She is advised and agrees to continue monitoring of blood glucose 4 times a day-before meals and at bedtime.    -Patient is encouraged to call clinic for blood glucose levels less than 70 or above 200 mg /dl.  -Her labs show her renal function is improving. She would have benefited from metformin however she reports intolerance to metformin.   - Patient specific target  for A1c; LDL, HDL, Triglycerides, and  Waist Circumference were discussed  in detail.  2) BP/HTN:  -Her blood pressure is controlled to target.   She is advised to continue her current blood pressure medications including lisinopril 5 mg daily.      3) Lipids/HPL: Recent lipid panel showed controlled LDL at 64.  She is advised to continue atorvastatin 10 mg p.o. nightly.  Side effects and precautions discussed with her.    4)  Weight/Diet: Her BMI 31.3 -a candidate for modest weight loss.  She declined CDE consult . exercise, and carbohydrates information provided.  5) Chronic Care/Health Maintenance:  -Patient is on ACEI/ARB and Statin medications and encouraged to continue to follow up with Ophthalmology, Podiatrist at least yearly or according to recommendations, and advised to  stay away from smoking. I have recommended yearly flu vaccine and pneumonia vaccination at least every 5 years; moderate intensity exercise for up to 150 minutes weekly; and  sleep for at least 7 hours a day.   - I advised patient to maintain close follow up with Jake Samples, PA-C for primary care needs.   - Time spent on this patient care encounter:  35 min, of which > 50% was spent in  counseling and the rest reviewing her blood glucose logs , discussing her hypoglycemia and hyperglycemia episodes, reviewing her current and  previous labs / studies  ( including abstraction from other facilities) and medications  doses and developing a  long term treatment plan and documenting her  care.   Please refer to Patient Instructions for Blood Glucose Monitoring and Insulin/Medications Dosing Guide"  in media tab for additional information. Please  also refer to " Patient Self Inventory" in the Media  tab for reviewed elements of pertinent patient history.  Saachi Golda Acre participated in the discussions, expressed understanding, and voiced agreement with the above plans.  All questions were answered to her satisfaction. she is encouraged to contact clinic should she have any questions or concerns prior to her return visit.     Follow up plan: -Return in about 4 months (around 05/04/2020) for F/U with Pre-visit Labs, Meter, Logs, A1c here.Glade Lloyd, MD Phone: [phone]  Fax: [phone]  -  This note was partially dictated with voice recognition software. Similar sounding words can be transcribed inadequately or may not  be corrected upon review.  01/04/2020, 9:19 AM

## 2020-01-04 NOTE — Patient Instructions (Signed)
                                     Advice for Weight Management  -For most of us the best way to lose weight is by diet management. Generally speaking, diet management means consuming less calories intentionally which over time brings about progressive weight loss.  This can be achieved more effectively by restricting carbohydrate consumption to the minimum possible.  So, it is critically important to know your numbers: how much calorie you are consuming and how much calorie you need. More importantly, our carbohydrates sources should be unprocessed or minimally processed complex starch food items.   Sometimes, it is important to balance nutrition by increasing protein intake (animal or plant source), fruits, and vegetables.  -Sticking to a routine mealtime to eat 3 meals a day and avoiding unnecessary snacks is shown to have a big role in weight control. Under normal circumstances, the only time we lose real weight is when we are hungry, so allow hunger to take place- hunger means no food between meal times, only water.  It is not advisable to starve.   -It is better to avoid simple carbohydrates including: Cakes, Sweet Desserts, Ice Cream, Soda (diet and regular), Sweet Tea, Candies, Chips, Cookies, Store Bought Juices, Alcohol in Excess of  1-2 drinks a day, Lemonade,  Artificial Sweeteners, Doughnuts, Coffee Creamers, "Sugar-free" Products, etc, etc.  This is not a complete list.....    -Consulting with certified diabetes educators is proven to provide you with the most accurate and current information on diet.  Also, you may be  interested in discussing diet options/exchanges , we can schedule a visit with Diana Campbell, RDN, CDE for individualized nutrition education.  -Exercise: If you are able: 30 -60 minutes a day ,4 days a week, or 150 minutes a week.  The longer the better.  Combine stretch, strength, and aerobic activities.  If you were told in the  past that you have high risk for cardiovascular diseases, you may seek evaluation by your heart doctor prior to initiating moderate to intense exercise programs.                                  Additional Care Considerations for Diabetes   -Diabetes  is a chronic disease.  The most important care consideration is regular follow-up with your diabetes care provider with the goal being avoiding or delaying its complications and to take advantage of advances in medications and technology.    -Type 2 diabetes is known to coexist with other important comorbidities such as high blood pressure and high cholesterol.  It is critical to control not only the diabetes but also the high blood pressure and high cholesterol to minimize and delay the risk of complications including coronary artery disease, stroke, amputations, blindness, etc.    - Studies showed that people with diabetes will benefit from a class of medications known as ACE inhibitors and statins.  Unless there are specific reasons not to be on these medications, the standard of care is to consider getting one from these groups of medications at an optimal doses.  These medications are generally considered safe and proven to help protect the heart and the kidneys.    - People with diabetes are encouraged to initiate and maintain regular follow-up with eye doctors, foot   doctors, dentists , and if necessary heart and kidney doctors.     - It is highly recommended that people with diabetes quit smoking or stay away from smoking, and get yearly  flu vaccine and pneumonia vaccine at least every 5 years.  One other important lifestyle recommendation is to ensure adequate sleep - at least 6-7 hours of uninterrupted sleep at night.  -Exercise: If you are able: 30 -60 minutes a day, 4 days a week, or 150 minutes a week.  The longer the better.  Combine stretch, strength, and aerobic activities.  If you were told in the past that you have high risk for  cardiovascular diseases, you may seek evaluation by your heart doctor prior to initiating moderate to intense exercise programs.          

## 2020-01-15 ENCOUNTER — Ambulatory Visit (INDEPENDENT_AMBULATORY_CARE_PROVIDER_SITE_OTHER): Payer: Medicare PPO

## 2020-01-15 DIAGNOSIS — I5042 Chronic combined systolic (congestive) and diastolic (congestive) heart failure: Secondary | ICD-10-CM

## 2020-01-15 DIAGNOSIS — Z95810 Presence of automatic (implantable) cardiac defibrillator: Secondary | ICD-10-CM

## 2020-01-15 NOTE — Progress Notes (Signed)
Thanks, I agree

## 2020-01-15 NOTE — Progress Notes (Signed)
EPIC Encounter for ICM Monitoring  Patient Name: GLENDY BARSANTI is a 74 y.o. female Date: 01/15/2020 Primary Care Physican: Ladon Applebaum Primary Cardiologist:Kelly Electrophysiologist:Croitoru 01/15/2020 Weight: 202lbs   Spoke with patient and reports feeling well at this time.  She denies fluid symptoms.    Optivol thoracic impedancesuggestingpossible fluid accumulation 01/02/2020.  Prescribed:Furosemide 40 mg take 1 tablet as needed for fluid  Labs: 08/28/2019 Creatinine0.90, BUN19, Potassium4.7, Sodium140, ONG29-52 A complete set of results can be found in Results Review.  Recommendations: Advised to take PRN Furosemide 1 tablet x 3 days.   Follow-up plan: ICM clinic phone appointment on1/03/2020 to recheck fluid levels. 91 day device clinic remote transmission1/12/2020.   EP/Cardiology Office Visits:03/04/2020 with Dr Royann Shivers.  Patient has office number to call and make 6 month appointment with Dr Tresa Endo in 10/2019.   Copy of ICM check sent to Dr.Croitoru and Dr Tresa Endo.  3 month ICM trend: 01/15/2020    1 Year ICM trend:       [name], RN 01/15/2020 12:56 PM

## 2020-01-16 ENCOUNTER — Other Ambulatory Visit: Payer: Self-pay

## 2020-01-16 ENCOUNTER — Other Ambulatory Visit (HOSPITAL_COMMUNITY): Payer: Self-pay | Admitting: Internal Medicine

## 2020-01-16 ENCOUNTER — Other Ambulatory Visit: Payer: Self-pay | Admitting: Cardiovascular Disease

## 2020-01-16 ENCOUNTER — Ambulatory Visit (HOSPITAL_COMMUNITY)
Admission: RE | Admit: 2020-01-16 | Discharge: 2020-01-16 | Disposition: A | Discharge status: Home / Self Care | Payer: Medicare PPO | Source: Ambulatory Visit | Attending: Internal Medicine | Admitting: Internal Medicine

## 2020-01-16 DIAGNOSIS — J189 Pneumonia, unspecified organism: Secondary | ICD-10-CM | POA: Diagnosis not present

## 2020-01-16 DIAGNOSIS — Z681 Body mass index (BMI) 19 or less, adult: Secondary | ICD-10-CM | POA: Diagnosis not present

## 2020-01-16 DIAGNOSIS — R051 Acute cough: Secondary | ICD-10-CM

## 2020-01-16 DIAGNOSIS — R059 Cough, unspecified: Secondary | ICD-10-CM | POA: Diagnosis not present

## 2020-01-16 DIAGNOSIS — E118 Type 2 diabetes mellitus with unspecified complications: Secondary | ICD-10-CM | POA: Diagnosis not present

## 2020-01-16 DIAGNOSIS — I4891 Unspecified atrial fibrillation: Secondary | ICD-10-CM | POA: Diagnosis not present

## 2020-01-31 ENCOUNTER — Ambulatory Visit (INDEPENDENT_AMBULATORY_CARE_PROVIDER_SITE_OTHER): Payer: Medicare PPO

## 2020-01-31 DIAGNOSIS — I428 Other cardiomyopathies: Secondary | ICD-10-CM | POA: Diagnosis not present

## 2020-01-31 LAB — CUP PACEART REMOTE DEVICE CHECK
Battery Remaining Longevity: 96 mo
Battery Voltage: 3 V
Brady Statistic AP VP Percent: 0.05 %
Brady Statistic AP VS Percent: 85.47 %
Brady Statistic AS VP Percent: 0 %
Brady Statistic AS VS Percent: 14.48 %
Brady Statistic RA Percent Paced: 82.4 %
Brady Statistic RV Percent Paced: 0.05 %
Date Time Interrogation Session: 20220112033322
HighPow Impedance: 44 Ohm
HighPow Impedance: 52 Ohm
Implantable Lead Implant Date: 20120702
Implantable Lead Implant Date: 20120702
Implantable Lead Location: 753859
Implantable Lead Location: 753860
Implantable Lead Model: 5076
Implantable Lead Model: 6947
Implantable Pulse Generator Implant Date: 20191011
Lead Channel Impedance Value: 342 Ohm
Lead Channel Impedance Value: 456 Ohm
Lead Channel Impedance Value: 494 Ohm
Lead Channel Pacing Threshold Amplitude: 0.75 V
Lead Channel Pacing Threshold Amplitude: 0.875 V
Lead Channel Pacing Threshold Pulse Width: 0.4 ms
Lead Channel Pacing Threshold Pulse Width: 0.4 ms
Lead Channel Sensing Intrinsic Amplitude: 12 mV
Lead Channel Sensing Intrinsic Amplitude: 12 mV
Lead Channel Sensing Intrinsic Amplitude: 3.125 mV
Lead Channel Sensing Intrinsic Amplitude: 3.125 mV
Lead Channel Setting Pacing Amplitude: 1.75 V
Lead Channel Setting Pacing Amplitude: 2 V
Lead Channel Setting Pacing Pulse Width: 0.4 ms
Lead Channel Setting Sensing Sensitivity: 0.3 mV

## 2020-02-02 ENCOUNTER — Telehealth: Payer: Self-pay

## 2020-02-02 ENCOUNTER — Ambulatory Visit (INDEPENDENT_AMBULATORY_CARE_PROVIDER_SITE_OTHER): Payer: Medicare PPO

## 2020-02-02 DIAGNOSIS — Z95810 Presence of automatic (implantable) cardiac defibrillator: Secondary | ICD-10-CM

## 2020-02-02 DIAGNOSIS — I5042 Chronic combined systolic (congestive) and diastolic (congestive) heart failure: Secondary | ICD-10-CM

## 2020-02-02 NOTE — Telephone Encounter (Signed)
Remote ICM transmission received.  Attempted call to patient regarding ICM remote transmission and left detailed message per DPR.  Advised to return call for any fluid symptoms or questions. Next ICM remote transmission scheduled 03/11/2020.     

## 2020-02-02 NOTE — Progress Notes (Signed)
EPIC Encounter for ICM Monitoring  Patient Name: HUDSYN BARICH is a 75 y.o. female Date: 02/02/2020 Primary Care Physican: Ladon Applebaum Primary Cardiologist:Kelly Electrophysiologist:Croitoru 01/15/2020 Weight: 202lbs   Attempted call to patient and unable to reach.  Left detailed message per DPR regarding transmission. Transmission reviewed.   Optivol thoracic impedancesuggestingfluid levels returned to normal.  Prescribed:Furosemide 40 mg take 1 tablet as needed for fluid  Labs: 08/28/2019 Creatinine0.90, BUN19, Potassium4.7, Sodium140, [id number] A complete set of results can be found in Results Review.  Recommendations: Left voice mail with ICM number and encouraged to call if experiencing any fluid symptoms.  Follow-up plan: ICM clinic phone appointment on2/21/2022. 91 day device clinic remote transmission4/13/2022.   EP/Cardiology Office Visits:03/04/2020 with Dr Royann Shivers. Patient has office number to call and make 6 month appointment with Dr Tresa Endo in 10/2019.  Copy of ICM check sent to Dr.Croitoru   3 month ICM trend: 01/31/2020.    1 Year ICM trend:       [name], RN 02/02/2020 9:37 AM

## 2020-02-02 NOTE — Progress Notes (Signed)
Good. Thanks!

## 2020-02-13 NOTE — Progress Notes (Signed)
Remote ICD transmission.   

## 2020-02-20 DIAGNOSIS — Z0000 Encounter for general adult medical examination without abnormal findings: Secondary | ICD-10-CM | POA: Diagnosis not present

## 2020-02-20 DIAGNOSIS — M2550 Pain in unspecified joint: Secondary | ICD-10-CM | POA: Diagnosis not present

## 2020-02-20 DIAGNOSIS — E6609 Other obesity due to excess calories: Secondary | ICD-10-CM | POA: Diagnosis not present

## 2020-02-20 DIAGNOSIS — E7849 Other hyperlipidemia: Secondary | ICD-10-CM | POA: Diagnosis not present

## 2020-02-20 DIAGNOSIS — Z1331 Encounter for screening for depression: Secondary | ICD-10-CM | POA: Diagnosis not present

## 2020-02-20 DIAGNOSIS — Z6833 Body mass index (BMI) 33.0-33.9, adult: Secondary | ICD-10-CM | POA: Diagnosis not present

## 2020-02-20 DIAGNOSIS — Z1389 Encounter for screening for other disorder: Secondary | ICD-10-CM | POA: Diagnosis not present

## 2020-03-04 ENCOUNTER — Encounter: Payer: Self-pay | Admitting: Cardiovascular Disease

## 2020-03-04 ENCOUNTER — Ambulatory Visit (INDEPENDENT_AMBULATORY_CARE_PROVIDER_SITE_OTHER): Payer: Medicare PPO | Admitting: Cardiovascular Disease

## 2020-03-04 ENCOUNTER — Other Ambulatory Visit: Payer: Self-pay

## 2020-03-04 VITALS — BP 123/57 | HR 62 | Ht 67.0 in | Wt 207.8 lb

## 2020-03-04 DIAGNOSIS — I10 Essential (primary) hypertension: Secondary | ICD-10-CM | POA: Diagnosis not present

## 2020-03-04 DIAGNOSIS — Z95810 Presence of automatic (implantable) cardiac defibrillator: Secondary | ICD-10-CM

## 2020-03-04 DIAGNOSIS — E119 Type 2 diabetes mellitus without complications: Secondary | ICD-10-CM | POA: Diagnosis not present

## 2020-03-04 DIAGNOSIS — I480 Paroxysmal atrial fibrillation: Secondary | ICD-10-CM | POA: Diagnosis not present

## 2020-03-04 DIAGNOSIS — E782 Mixed hyperlipidemia: Secondary | ICD-10-CM | POA: Diagnosis not present

## 2020-03-04 DIAGNOSIS — I428 Other cardiomyopathies: Secondary | ICD-10-CM

## 2020-03-04 DIAGNOSIS — I5042 Chronic combined systolic (congestive) and diastolic (congestive) heart failure: Secondary | ICD-10-CM

## 2020-03-04 MED ORDER — SACUBITRIL-VALSARTAN 49-51 MG PO TABS: 1.0000 | ORAL_TABLET | Freq: Two times a day (BID) | ORAL | 11 refills | Status: DC

## 2020-03-04 NOTE — Patient Instructions (Addendum)
Medication Instructions:  STOP the Lisinopril  START Entresto 49-51 mg twice daily. Start the Entresto 48 hours after you stop the Lisinopril.   *If you need a refill on your cardiac medications before your next appointment, please call your pharmacy*   Lab Work: None ordered If you have labs (blood work) drawn today and your tests are completely normal, you will receive your results only by: Marland Kitchen MyChart Message (if you have MyChart) OR . A paper copy in the mail If you have any lab test that is abnormal or we need to change your treatment, we will call you to review the results.   Testing/Procedures: None ordered   Follow-Up: At [facility], you and your health needs are our priority.  As part of our continuing mission to provide you with exceptional heart care, we have created designated Provider Care Teams.  These Care Teams include your primary Cardiologist (physician) and Advanced Practice Providers (APPs -  Physician Assistants and Nurse Practitioners) who all work together to provide you with the care you need, when you need it.  We recommend signing up for the patient portal called "MyChart".  Sign up information is provided on this After Visit Summary.  MyChart is used to connect with patients for Virtual Visits (Telemedicine).  Patients are able to view lab/test results, encounter notes, upcoming appointments, etc.  Non-urgent messages can be sent to your provider as well.   To learn more about what you can do with MyChart, go to [email or web address].    Your next appointment:   Follow up in one month with Dr. Tresa Endo or pharmD.

## 2020-03-04 NOTE — Progress Notes (Signed)
Patient ID: Diana Campbell, female   DOB: 1945/08/12, 75 y.o.   MRN: [id number]    Cardiology Office Note    Date:  03/04/2020   [name], [place] 08/31/1945, MRN [id number]  PCP:  Jake Samples, PA-C  Cardiologist:  Shelva Majestic, M.D. Sanda Klein, MD   No chief complaint on file.   History of Present Illness:  Diana Campbell is a 75 y.o. female a remote history of ventricular fibrillation arrest in June 2005, resuscitated, in the setting of nonischemic cardiomyopathy with left ventricular ejection fraction that has improved from 15% at diagnosis to around 45% by most recent evaluation. She has minor coronary atherosclerosis by previous angiography. She had a  Medtronic defibrillator/Sprint Fidelis lead,subsequently had a complete revision of her system with placement of a new right ventricular lead and dual-chamber generator in July 2012.  Underwent elective generator change out in October 2019.  She has not been hospitalized since January 8315 (complicated E. faecalis urinary tract infection).    She has dyspnea bending over or walking the driveway. She denies any chest pain at rest or with exertion, dyspnea at rest, orthopnea, paroxysmal nocturnal dyspnea, syncope, palpitations, focal neurological deficits, intermittent claudication, lower extremity edema, unexplained weight gain, cough, hemoptysis or wheezing.  Optivol has been repeatedly above range in the last few months, requiring repeated dosing of furosemide, with improvement, then recurrence.  Additional problems include obstructive sleep apnea, diabetes mellitus on insulin, hypertension and hyperlipidemia.  Most recent hemoglobin A1c was 7.3% in December 2021. LDL was 141 last August (temporarily off meds), 69 back on meds Feb 01.  Normal device function.   Estimated generator longevity 7.9 years.  She has 88% atrial pacing, no ventricular pacing (native AV conduction with left bundle branch block).  No afib  seen, a single 6-beat episode of NSVT. She does have an MRI conditional generator, but also has abandoned leads (so cannot have MRI scans).      Past Medical History:  Diagnosis Date  . Arthritis   . Automatic implantable cardioverter-defibrillator in situ 07/2010  . Cardiac abnormality    with defibrilator  . Chronic combined systolic and diastolic CHF (congestive heart failure) ([place]) 09/04/2015  . Diabetes mellitus   . H/O cardiac arrest 2006  . Hypercholesteremia   . Hypertension   . Neuropathy     Past Surgical History:  Procedure Laterality Date  . 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  Marland Kitchen CARDIAC CATHETERIZATION  04/14/2000   Normal coronary arteries.  Marland Kitchen CARDIAC DEFIBRILLATOR PLACEMENT  07/21/2010   Medtronic Protecta XT DR, model#[id number], serial#[id number] h  . CARDIOVASCULAR STRESS TEST  04/05/2000   Scintigraphic evidence of mild LV dilatation and global LV dysfunction with an EF 41% with mild inferolateral ischemia.  Marland Kitchen CARPAL TUNNEL RELEASE Right 01/23/2016   Procedure: CARPAL TUNNEL RELEASE;  Surgeon: Carole Civil, MD;  Location: [facility];  Service: Orthopedics;  Laterality: Right;  pt knows to arrive at 6:15  . COLONOSCOPY  03/30/2011   Procedure: COLONOSCOPY;  Surgeon: Jamesetta So, MD;  Location: [facility];  Service: Gastroenterology;  Laterality: N/A;  . DORSAL COMPARTMENT RELEASE Right 08/11/2013   Procedure: RELEASE DORSAL COMPARTMENT (DEQUERVAIN);  Surgeon: Carole Civil, MD;  Location: [facility];  Service: Orthopedics;  Laterality: Right;  . ICD GENERATOR CHANGEOUT N/A 10/29/2017   Procedure: [place];  Surgeon: Sanda Klein, MD;  Location: [facility];  Service: Cardiovascular;  Laterality: N/A;  . TRANSTHORACIC ECHOCARDIOGRAM  08/03/2011   EF 45-50%, mildly reduced LV systolic  function.    Outpatient Medications Prior to Visit  Medication Sig Dispense Refill  . ACCU-CHEK FASTCLIX LANCETS MISC 1 each by Does not apply route 2 (two) times daily. 200 each 5  . acetaminophen (TYLENOL) 325 MG tablet Take 650 mg by mouth 3 (three) times daily.    Marland Kitchen aspirin EC 81 MG tablet Take 1 tablet (81 mg total) by mouth daily. 90 tablet 3  . atorvastatin (LIPITOR) 20 MG tablet Take 20 mg by mouth daily.    . Blood Glucose Monitoring Suppl (ACCU-CHEK GUIDE) w/Device KIT Use as directed to check blood glucose four times daily 1 kit 0  . carvedilol (COREG) 12.5 MG tablet TAKE 1/2 TABLET BY MOUTH TWICE A DAY WITH A MEAL 90 tablet 2  . glucose blood (ACCU-CHEK GUIDE) test strip Use as instructed to test blood glucose four times daily 400 each 1  . HUMALOG MIX 75/25 KWIKPEN (75-25) 100 UNIT/ML Kwikpen INJECT 30-35 UNITS INTO THE SKIN 2 (TWO) TIMES DAILY BEFORE A MEAL. 63 mL 0  . Lancets Misc. (ACCU-CHEK FASTCLIX LANCET) KIT 1 each by Does not apply route 2 (two) times daily. 1 kit 2  . lisinopril (ZESTRIL) 5 MG tablet TAKE 1 TABLET BY MOUTH EVERY MORNING AND 1/2 TABLET AFTERNOON 135 tablet 3  . furosemide (LASIX) 40 MG tablet TAKE 1 TABLET (40 MG TOTAL) BY MOUTH DAILY AS NEEDED FOR FLUID. 90 tablet 3   No facility-administered medications prior to visit.     Allergies:   Patient has no known allergies.   Social History   Socioeconomic History  . Marital status: Married    Spouse name: Not on file  . Number of children: Not on file  . Years of education: Not on file  . Highest education level: Not on file  Occupational History  . Not on file  Tobacco Use  . Smoking status: Never Smoker  . Smokeless tobacco: Current User    Types: Snuff  . Tobacco comment: 3 dips per day  Vaping Use  . Vaping Use: Never used  Substance and Sexual Activity  . Alcohol use: No  . Drug use: No  . Sexual activity: Yes    Birth control/protection: None  Other Topics Concern  . Not on file   Social History Narrative  . Not on file   Social Determinants of Health   Financial Resource Strain: Not on file  Food Insecurity: Not on file  Transportation Needs: Not on file  Physical Activity: Not on file  Stress: Not on file  Social Connections: Not on file     ROS:   Please see the history of present illness.    ROS All other systems are reviewed and are negative.   PHYSICAL EXAM:   VS:  BP (!) 123/57   Pulse 62   Ht 5' 7"  (1.702 m)   Wt 207 lb 12.8 oz (94.3 kg)   SpO2 96%   BMI 32.55 kg/m      General: Alert, oriented x3, no distress, mildly obese. Healthy ICD site. Head: no evidence of trauma, PERRL, EOMI, no exophtalmos or lid lag, no myxedema, no xanthelasma; normal ears, nose and oropharynx Neck: normal jugular venous pulsations and no hepatojugular reflux; brisk carotid pulses without delay and no carotid bruits Chest: clear to auscultation, no signs of  consolidation by percussion or palpation, normal fremitus, symmetrical and full respiratory excursions Cardiovascular: normal position and quality of the apical impulse, regular rhythm, normal first and second heart sounds, no murmurs, rubs or gallops Abdomen: no tenderness or distention, no masses by palpation, no abnormal pulsatility or arterial bruits, normal bowel sounds, no hepatosplenomegaly Extremities: no clubbing, cyanosis or edema; 2+ radial, ulnar and brachial pulses bilaterally; 2+ right femoral, posterior tibial and dorsalis pedis pulses; 2+ left femoral, posterior tibial and dorsalis pedis pulses; no subclavian or femoral bruits Neurological: grossly nonfocal Psych: Normal mood and affect   Wt Readings from Last 3 Encounters:  03/04/20 207 lb 12.8 oz (94.3 kg)  01/04/20 200 lb (90.7 kg)  09/04/19 205 lb 12.8 oz (93.4 kg)      Studies/Labs Reviewed:   EKG:  EKG is ordered today.  It shows A paced, V sensed rhythm (LBBB 142 ms, left axis deviation). QTc 487 ms.  Recent Labs: 08/28/2019: ALT 9;  BUN 19; Creat 0.90; Potassium 4.7; Sodium 140; TSH 1.40   Lipid Panel    Component Value Date/Time   CHOL 219 (H) 08/28/2019 0851   TRIG 65 08/28/2019 0851   HDL 62 08/28/2019 0851   CHOLHDL 3.5 08/28/2019 0851   VLDL 14 09/26/2015 0856   LDLCALC 141 (H) 08/28/2019 0851   03/01/2020 Chol 138, HDL 53, TG 78 (calc LDL 69).  ASSESSMENT:    1. Chronic combined systolic and diastolic CHF (congestive heart failure) (HCC)   2. Cardiomyopathy, nonischemic ([place])   3. Essential hypertension, benign   4. ICD (implantable cardioverter-defibrillator) in place   5. Controlled type 2 diabetes mellitus without complication, without long-term current use of insulin ([place])   6. PAF (paroxysmal atrial fibrillation) ([place])   7. Mixed hyperlipidemia      PLAN:  In order of problems listed above:  1.CMP/CHF:  NYHA class 2 (occasionally 3), euvolemic by exam, but Optivol suggests repeated issues with hypervolemia, correcting with loop diuretics.  Most recent ejection fraction 40-45% by echo performed in March 2019.  She is on carvedilol and lisinopril. Will benefit from transition to [facility]. Discussed need for 36h washout from the ACEi. Bring back for labs and clinical assessment for titration of medication in 3-4 weeks (preferably w Dr. Claiborne Billings if he has an available appt.).  Consider SGLT-2 inhibitor, but she is very worried about the UTI risk after her 2020 hospitalization.  2. HTN:  Controlled. May decrease further w Entresto. Asked her to call us if she has hypotension symptoms. 3. ICD: Generator change out, normal device function.  Note that she has an abandoned lead and cannot unfortunately have MRIs even though she has an MRI conditional system.  Option for future CRT upgrade if clinical status deteriorates. 4. DM: Normal creatinine level, has known diabetic nephropathy.  Hemoglobin A1c is fair at 7.3%.   5. AFib: had one very brief episode in 2017, no recurrence.  I don't think anticoagulation is  indicated 6. HLP: On statin.  LDL close to target. Reports some myalgia w atorvastatin, consider switching to alternative agent.    Medication Adjustments/Labs and Tests Ordered: Current medicines are reviewed at length with the patient today.  Concerns regarding medicines are outlined above.  Medication changes, Labs and Tests ordered today are listed in the Patient Instructions below. Patient Instructions  Medication Instructions:  STOP the Lisinopril  START Entresto 49-51 mg twice daily. Start the Entresto 48 hours after you stop the Lisinopril.   *If you need a refill  on your cardiac medications before your next appointment, please call your pharmacy*   Lab Work: None ordered If you have labs (blood work) drawn today and your tests are completely normal, you will receive your results only by: Marland Kitchen MyChart Message (if you have MyChart) OR . A paper copy in the mail If you have any lab test that is abnormal or we need to change your treatment, we will call you to review the results.   Testing/Procedures: None ordered   Follow-Up: At [facility], you and your health needs are our priority.  As part of our continuing mission to provide you with exceptional heart care, we have created designated Provider Care Teams.  These Care Teams include your primary Cardiologist (physician) and Advanced Practice Providers (APPs -  Physician Assistants and Nurse Practitioners) who all work together to provide you with the care you need, when you need it.  We recommend signing up for the patient portal called "MyChart".  Sign up information is provided on this After Visit Summary.  MyChart is used to connect with patients for Virtual Visits (Telemedicine).  Patients are able to view lab/test results, encounter notes, upcoming appointments, etc.  Non-urgent messages can be sent to your provider as well.   To learn more about what you can do with MyChart, go to [email or web address].    Your next  appointment:   Follow up in one month with Dr. Claiborne Billings or pharmD.        Signed, Sanda Klein, MD  03/04/2020 9:11 AM    [address], [place], [place]  [address] Phone: [phone]; Fax: [phone]

## 2020-03-11 ENCOUNTER — Ambulatory Visit (INDEPENDENT_AMBULATORY_CARE_PROVIDER_SITE_OTHER): Payer: Medicare PPO

## 2020-03-11 DIAGNOSIS — I5042 Chronic combined systolic (congestive) and diastolic (congestive) heart failure: Secondary | ICD-10-CM | POA: Diagnosis not present

## 2020-03-11 DIAGNOSIS — Z95810 Presence of automatic (implantable) cardiac defibrillator: Secondary | ICD-10-CM | POA: Diagnosis not present

## 2020-03-19 NOTE — Progress Notes (Signed)
EPIC Encounter for ICM Monitoring  Patient Name: Diana Campbell is a 75 y.o. female Date: 03/19/2020 Primary Care Physican: Ladon Applebaum Primary Cardiologist:Kelly Electrophysiologist:Croitoru 03/04/2020 Office Weight: 207lbs   Transmission reviewed.   Optivol thoracic impedancesuggestingnormal fluid levels.  Prescribed:Furosemide 40 mg take 1 tablet as needed for fluid  Labs: 08/28/2019 Creatinine0.90, BUN19, Potassium4.7, Sodium140, MBB40-37 A complete set of results can be found in Results Review.  Recommendations:No changes  Follow-up plan: ICM clinic phone appointment on4/04/2020. 91 day device clinic remote transmission4/13/2022.   EP/Cardiology Office Visits:  Recall2/14/2023 with Dr Royann Shivers. Recall 06/02/2020 with Dr Tresa Endo.    Copy of ICM check sent to Dr.Croitoru   3 month ICM trend: 03/11/2020.    1 Year ICM trend:       [name], RN 03/19/2020 4:19 PM

## 2020-03-23 ENCOUNTER — Other Ambulatory Visit: Payer: Self-pay | Admitting: "Endocrinology

## 2020-04-03 ENCOUNTER — Encounter: Payer: Self-pay | Admitting: *Deleted

## 2020-04-03 ENCOUNTER — Other Ambulatory Visit: Payer: Self-pay

## 2020-04-03 ENCOUNTER — Ambulatory Visit (INDEPENDENT_AMBULATORY_CARE_PROVIDER_SITE_OTHER): Payer: Medicare PPO | Admitting: Pharmacist

## 2020-04-03 VITALS — BP 124/60 | HR 70 | Resp 16 | Ht 66.0 in | Wt 209.4 lb

## 2020-04-03 DIAGNOSIS — Z79899 Other long term (current) drug therapy: Secondary | ICD-10-CM | POA: Diagnosis not present

## 2020-04-03 DIAGNOSIS — I5042 Chronic combined systolic (congestive) and diastolic (congestive) heart failure: Secondary | ICD-10-CM | POA: Diagnosis not present

## 2020-04-03 LAB — BASIC METABOLIC PANEL
BUN/Creatinine Ratio: 15 (ref 12–28)
BUN: 14 mg/dL (ref 8–27)
CO2: 24 mmol/L (ref 20–29)
Calcium: 9.2 mg/dL (ref 8.7–10.3)
Chloride: 101 mmol/L (ref 96–106)
Creatinine, Ser: 0.91 mg/dL (ref 0.57–1.00)
Glucose: 369 mg/dL — ABNORMAL HIGH (ref 65–99)
Potassium: 4.4 mmol/L (ref 3.5–5.2)
Sodium: 140 mmol/L (ref 134–144)
eGFR: 65 mL/min/{1.73_m2} (ref >59)

## 2020-04-03 NOTE — Patient Instructions (Addendum)
Return for a  follow up appointment in AS NEEDED  Go to the lab in TODAY  Check your blood pressure at home daily (if able) and keep record of the readings.  Take your BP meds as follows: *CONTINUE taking ENTRESTO 49-51mg  twice daily *DISCUSS option of FARXIGA/JARDIANCE with endocrinologist  Bring all of your meds, your BP cuff and your record of home blood pressures to your next appointment.  Exercise as you're able, try to walk approximately 30 minutes per day.  Keep salt intake to a minimum, especially watch canned and prepared boxed foods.  Eat more fresh fruits and vegetables and fewer canned items.  Avoid eating in fast food restaurants.    HOW TO TAKE YOUR BLOOD PRESSURE: . Rest 5 minutes before taking your blood pressure. .  Don't smoke or drink caffeinated beverages for at least 30 minutes before. . Take your blood pressure before (not after) you eat. . Sit comfortably with your back supported and both feet on the floor (don't cross your legs). . Elevate your arm to heart level on a table or a desk. . Use the proper sized cuff. It should fit smoothly and snugly around your bare upper arm. There should be enough room to slip a fingertip under the cuff. The bottom edge of the cuff should be 1 inch above the crease of the elbow. . Ideally, take 3 measurements at one sitting and record the average.

## 2020-04-03 NOTE — Progress Notes (Signed)
Patient ID: Diana Campbell                 DOB: 1945-03-18                      MRN: [id number]     HPI: Diana Campbell is a 75 y.o. female referred by Dr. Sallyanne Kuster to pharmacist clinic for HF medication titration and management. PMH includes nonischemic cardiomyopathy with EF 40- 45% (improved from initial 15%), ICD in situ, atrial fibrillation, diabetes, mixed hyperlipidemia, hypertension, and neuropathy. Patient currently on Entresto, carvedilol, and loop diuretic.  May benefit from adding SGLT2 and spironolactone to complete guideline recommended therapy. Dr Sallyanne Kuster noted increased risk of UTI for this patient. Will focus in medication titration, and adding mineralocorticoid therapy at this time. Will discuss Farxiga/Jardiance option with her endocrinologist.   Patient presents accompany by her husband and will like to stop taking Entresto. Noted her sugars are running 180-190 since started Entresto. Denies any other side effect, dizziness, swelling, or chest pain. Noted No BMET repeat since started new medication either, and no BP home readings provided.  Current HTN meds:  Carvedilol 12.82m twice daily Furosemide 481mdaily - last dose 2 months (no difference in the last months) Entresto 49-5128mwice daily  (8am - 5pm)  Previously tried:  Lisinopril 7.5mg22mily  BP goal: <130/80  Family History:   Social History: denies alcohol and tobacco use  Diet:  Breakfast - cookies & regula soda Lunch - chip, cookies, and soda Supper - 2 brisket with chicken gravy, sausage brisket, cubed steak & bread, or hot dogs  Exercise: no exercise (activities of daily living)  Home BP readings: none provided - had machine but don't check it  Wt Readings from Last 3 Encounters:  04/03/20 209 lb 6.4 oz (95 kg)  03/04/20 207 lb 12.8 oz (94.3 kg)  01/04/20 200 lb (90.7 kg)   BP Readings from Last 3 Encounters:  04/03/20 124/60  03/04/20 (!) 123/57  01/04/20 120/66   Pulse Readings from Last  3 Encounters:  04/03/20 70  03/04/20 62  01/04/20 60    Past Medical History:  Diagnosis Date  . Arthritis   . Automatic implantable cardioverter-defibrillator in situ 07/2010  . Cardiac abnormality    with defibrilator  . Chronic combined systolic and diastolic CHF (congestive heart failure) (HCC)[place]16/2017  . Diabetes mellitus   . H/O cardiac arrest 2006  . Hypercholesteremia   . Hypertension   . Neuropathy     Current Outpatient Medications on File Prior to Visit  Medication Sig Dispense Refill  . ACCU-CHEK FASTCLIX LANCETS MISC 1 each by Does not apply route 2 (two) times daily. 200 each 5  . acetaminophen (TYLENOL) 325 MG tablet Take 650 mg by mouth 3 (three) times daily.    . asMarland Kitchenirin EC 81 MG tablet Take 1 tablet (81 mg total) by mouth daily. 90 tablet 3  . atorvastatin (LIPITOR) 20 MG tablet Take 20 mg by mouth daily.    . Blood Glucose Monitoring Suppl (ACCU-CHEK GUIDE) w/Device KIT Use as directed to check blood glucose four times daily 1 kit 0  . carvedilol (COREG) 12.5 MG tablet TAKE 1/2 TABLET BY MOUTH TWICE A DAY WITH A MEAL 90 tablet 2  . furosemide (LASIX) 40 MG tablet TAKE 1 TABLET (40 MG TOTAL) BY MOUTH DAILY AS NEEDED FOR FLUID. 90 tablet 3  . glucose blood (ACCU-CHEK GUIDE) test strip Use as instructed to test blood  glucose four times daily 400 each 1  . HUMALOG MIX 75/25 KWIKPEN (75-25) 100 UNIT/ML Kwikpen INJECT 30-35 UNITS INTO THE SKIN 2 (TWO) TIMES DAILY BEFORE A MEAL. 15 mL 2  . Lancets Misc. (ACCU-CHEK FASTCLIX LANCET) KIT 1 each by Does not apply route 2 (two) times daily. 1 kit 2  . sacubitril-valsartan (ENTRESTO) 49-51 MG Take 1 tablet by mouth 2 (two) times daily. 60 tablet 11   No current facility-administered medications on file prior to visit.    No Known Allergies  Blood pressure 124/60, pulse 70, resp. rate 16, height 5' 6"  (1.676 m), weight 209 lb 6.4 oz (95 kg), SpO2 96 %.  Chronic combined systolic and diastolic CHF (congestive heart  failure) ([place]) Patient feels that [name] is causing her blood glucose to go up, but continues to eat cookies and regula soda for breakfast. She also continues to eat hot dog and food high in sodium.   We spend most of the visit discussing positive lifestyle modification to help regulate SOB, blood glucose, and blood pressure. Patient is willing to continue Entresto for another month and discuss option of SGLT2 therapy with her endocrinologist. I plan to follow up by phone in 3-4 weeks, and determine if patient is ready to add spironolactone to her therapy. Will repeat BMET today as well.   Raquel Rodriguez-Guzman PharmD, BCPS, [place] [address] [place] [address] 04/12/2020 6:14 PM

## 2020-04-12 ENCOUNTER — Encounter: Payer: Self-pay | Admitting: Pharmacist

## 2020-04-12 NOTE — Assessment & Plan Note (Signed)
Patient feels that Sherryll Burger is causing her blood glucose to go up, but continues to eat cookies and regula soda for breakfast. She also continues to eat hot dog and food high in sodium.   We spend most of the visit discussing positive lifestyle modification to help regulate SOB, blood glucose, and blood pressure. Patient is willing to continue Entresto for another month and discuss option of SGLT2 therapy with her endocrinologist. I plan to follow up by phone in 3-4 weeks, and determine if patient is ready to add spironolactone to her therapy. Will repeat BMET today as well.

## 2020-04-15 ENCOUNTER — Telehealth: Payer: Self-pay

## 2020-04-15 NOTE — Telephone Encounter (Signed)
Patient calling to report she thinks Sherryll Burger is causing her blood sugars to be high.  She said since starting Entresto she has not felt well but only reports about high blood sugar.  Patient had reported same info to Cardiology pharmacist at 04/03/2020.  Patient admits to eating food like cake and foods that are high in starch and explained these types are foods are more than likely the cause of elevated blood sugars.    Patient has not taken Entresto for past 3 days and wants to know if she should restart Lisinopril since she has not taken Entresto for past 3 days.  Advised will send copy to Dr Royann Shivers for him to review and provide recommendations. Advised do not restart Lisinopril until someone from the office calls her back with recommendations.

## 2020-04-16 NOTE — Telephone Encounter (Signed)
Since she has been off Entresto for a few days, is her blood sugar better? I suspect that there is NO connection between the entresto and the lisinopril and would recommend continuing the Entresto.

## 2020-04-16 NOTE — Telephone Encounter (Signed)
Spoke with patient.  Asked patient if blood sugars are lower since she has not had Entresto for past 3 days and she said it depends on what she eats. Discussed at length diet and she does not follow diabetic or low salt diet.  Advised that Dr Royann Shivers recommended she stay on Entresto because there is not correlation that he knows of that it would cause high blood sugars.  Explained all the snacks such as potato chips and cakes she eats is creating high blood sugars.  Advised to call diabetic physician to discuss diet.    Education given not to stop meds without physician approval since this increases risk of hospitalization.  She plans on resuming Entresto today.  Encouraged her to contact the physicians office for changes in her condition.

## 2020-04-16 NOTE — Telephone Encounter (Signed)
Attempted call to patient to provide Dr Croitoru's recommendation and no answer.

## 2020-04-22 ENCOUNTER — Ambulatory Visit (INDEPENDENT_AMBULATORY_CARE_PROVIDER_SITE_OTHER): Payer: Medicare PPO

## 2020-04-22 DIAGNOSIS — Z95810 Presence of automatic (implantable) cardiac defibrillator: Secondary | ICD-10-CM | POA: Diagnosis not present

## 2020-04-22 DIAGNOSIS — I5042 Chronic combined systolic (congestive) and diastolic (congestive) heart failure: Secondary | ICD-10-CM

## 2020-04-23 NOTE — Progress Notes (Signed)
EPIC Encounter for ICM Monitoring  Patient Name: Diana Campbell is a 75 y.o. female Date: 04/23/2020 Primary Care Physican: Diana Campbell Primary Cardiologist:Diana Campbell Electrophysiologist:Diana Campbell 03/04/2020 Office Weight: 207lbs   Spoke with patient and reports feeling tired all the time but does not have any fluid symptoms.   Optivol thoracic impedancesuggestingnormal fluid levels.  Prescribed:Furosemide 40 mg take 1 tablet as needed for fluid  Labs: 08/28/2019 Creatinine0.90, BUN19, Potassium4.7, Sodium140, MLY65-03 A complete set of results can be found in Results Review.  Recommendations:No changes and encouraged to call if experiencing any fluid symptoms.  Follow-up plan: ICM clinic phone appointment on 05/27/2020. 91 day device clinic remote transmission4/13/2022.   EP/Cardiology Office Visits:  Recall2/14/2023 with Dr Diana Campbell. Recall 06/02/2020 with Dr Diana Campbell.    Copy of ICM check sent to Diana Campbell  3 month ICM trend: 04/22/2020.    1 Year ICM trend:       Diana Soda, RN 04/23/2020 11:46 AM

## 2020-04-24 NOTE — Telephone Encounter (Signed)
Called patient, she advised that she has only been taking 1 tablet of the Entresto- she thinks this is the cause of her fatigue and sleepiness. I did advise with patient the drowsiness could be a cause of her blood sugars, she mentions they have been "good" but would not give me readings. Patient denies any chest pain, shortness of breath or swelling. I did advise her to get a BP machine and check that at home and let us know how that's is in about a week or so, patient will do this.  Patient also advised to restart the [facility] for 2 times daily, and let us know if the sleepiness gets worse- patient states there was not a huge change from taking 1 to taking 2 tablets. She will call us on how she feels.   Patient thankful for call back.

## 2020-04-24 NOTE — Telephone Encounter (Signed)
Pt c/o medication issue:  1. Name of Medication:  sacubitril-valsartan (ENTRESTO) 49-51 MG Take 1 tablet by mouth 2 (two) times daily.     2. How are you currently taking this medication (dosage and times per day)?  Patient states she has been taking 1 tablet by mouth once daily  3. Are you having a reaction (difficulty breathing--STAT)? No   4. What is your medication issue?   Patient is following up. She states now the medication is causing extreme drowsiness and fatigue.  She states she sleeps all the time. She states she even started just taking 1 tablet a day but it has not helped.

## 2020-04-24 NOTE — Telephone Encounter (Signed)
Fatigue can be caused by elevated blood glucose, dehydration, or low blood pressure.  Last OV patient was tolerating Entresto twice daily and only complain was elevated blood glucose (not related to [place] , but related to poor diet choices).   What is the blood pressure and heart rate? Blood glucose improved?

## 2020-04-24 NOTE — Telephone Encounter (Signed)
Please advise, I seen PharmD was going to contact patient back in 3-4 weeks to check in and discuss, patient complaints at PharmD visit of her entresto- patient has since only been taking one tablet.  Will route to them to make them aware.

## 2020-04-25 ENCOUNTER — Other Ambulatory Visit: Payer: Self-pay | Admitting: "Endocrinology

## 2020-04-25 DIAGNOSIS — IMO0002 Reserved for concepts with insufficient information to code with codable children: Secondary | ICD-10-CM

## 2020-04-25 DIAGNOSIS — E1165 Type 2 diabetes mellitus with hyperglycemia: Secondary | ICD-10-CM

## 2020-04-29 DIAGNOSIS — E118 Type 2 diabetes mellitus with unspecified complications: Secondary | ICD-10-CM | POA: Diagnosis not present

## 2020-04-29 DIAGNOSIS — Z794 Long term (current) use of insulin: Secondary | ICD-10-CM | POA: Diagnosis not present

## 2020-04-29 DIAGNOSIS — E1165 Type 2 diabetes mellitus with hyperglycemia: Secondary | ICD-10-CM | POA: Diagnosis not present

## 2020-04-30 LAB — COMPREHENSIVE METABOLIC PANEL
ALT: 8 IU/L (ref 0–32)
AST: 13 IU/L (ref 0–40)
Albumin/Globulin Ratio: 1.5 (ref 1.2–2.2)
Albumin: 4.1 g/dL (ref 3.7–4.7)
Alkaline Phosphatase: 77 IU/L (ref 44–121)
BUN/Creatinine Ratio: 15 (ref 12–28)
BUN: 14 mg/dL (ref 8–27)
Bilirubin Total: 0.5 mg/dL (ref 0.0–1.2)
CO2: 24 mmol/L (ref 20–29)
Calcium: 9.5 mg/dL (ref 8.7–10.3)
Chloride: 107 mmol/L — ABNORMAL HIGH (ref 96–106)
Creatinine, Ser: 0.92 mg/dL (ref 0.57–1.00)
Globulin, Total: 2.7 g/dL (ref 1.5–4.5)
Glucose: 89 mg/dL (ref 65–99)
Potassium: 4.1 mmol/L (ref 3.5–5.2)
Sodium: 146 mmol/L — ABNORMAL HIGH (ref 134–144)
Total Protein: 6.8 g/dL (ref 6.0–8.5)
eGFR: 65 mL/min/{1.73_m2} (ref >59)

## 2020-05-01 ENCOUNTER — Ambulatory Visit (INDEPENDENT_AMBULATORY_CARE_PROVIDER_SITE_OTHER): Payer: Medicare PPO

## 2020-05-01 DIAGNOSIS — I428 Other cardiomyopathies: Secondary | ICD-10-CM | POA: Diagnosis not present

## 2020-05-02 LAB — CUP PACEART REMOTE DEVICE CHECK
Battery Remaining Longevity: 92 mo
Battery Voltage: 3 V
Brady Statistic AP VP Percent: 0.04 %
Brady Statistic AP VS Percent: 63.48 %
Brady Statistic AS VP Percent: 0 %
Brady Statistic AS VS Percent: 36.48 %
Brady Statistic RA Percent Paced: 62.68 %
Brady Statistic RV Percent Paced: 0.04 %
Date Time Interrogation Session: 20220413043723
HighPow Impedance: 38 Ohm
HighPow Impedance: 44 Ohm
Implantable Lead Implant Date: 20120702
Implantable Lead Implant Date: 20120702
Implantable Lead Location: 753859
Implantable Lead Location: 753860
Implantable Lead Model: 5076
Implantable Lead Model: 6947
Implantable Pulse Generator Implant Date: 20191011
Lead Channel Impedance Value: 323 Ohm
Lead Channel Impedance Value: 399 Ohm
Lead Channel Impedance Value: 456 Ohm
Lead Channel Pacing Threshold Amplitude: 0.75 V
Lead Channel Pacing Threshold Amplitude: 1 V
Lead Channel Pacing Threshold Pulse Width: 0.4 ms
Lead Channel Pacing Threshold Pulse Width: 0.4 ms
Lead Channel Sensing Intrinsic Amplitude: 10 mV
Lead Channel Sensing Intrinsic Amplitude: 10 mV
Lead Channel Sensing Intrinsic Amplitude: 2.375 mV
Lead Channel Sensing Intrinsic Amplitude: 2.375 mV
Lead Channel Setting Pacing Amplitude: 1.5 V
Lead Channel Setting Pacing Amplitude: 2 V
Lead Channel Setting Pacing Pulse Width: 0.4 ms
Lead Channel Setting Sensing Sensitivity: 0.3 mV

## 2020-05-06 ENCOUNTER — Ambulatory Visit: Payer: Medicare PPO | Admitting: "Endocrinology

## 2020-05-06 ENCOUNTER — Other Ambulatory Visit: Payer: Self-pay

## 2020-05-06 ENCOUNTER — Encounter: Payer: Self-pay | Admitting: "Endocrinology

## 2020-05-06 VITALS — BP 140/62 | HR 60 | Ht 66.0 in | Wt 212.2 lb

## 2020-05-06 DIAGNOSIS — I10 Essential (primary) hypertension: Secondary | ICD-10-CM | POA: Diagnosis not present

## 2020-05-06 DIAGNOSIS — E118 Type 2 diabetes mellitus with unspecified complications: Secondary | ICD-10-CM

## 2020-05-06 DIAGNOSIS — Z794 Long term (current) use of insulin: Secondary | ICD-10-CM | POA: Diagnosis not present

## 2020-05-06 DIAGNOSIS — E782 Mixed hyperlipidemia: Secondary | ICD-10-CM | POA: Diagnosis not present

## 2020-05-06 DIAGNOSIS — IMO0002 Reserved for concepts with insufficient information to code with codable children: Secondary | ICD-10-CM

## 2020-05-06 DIAGNOSIS — E1165 Type 2 diabetes mellitus with hyperglycemia: Secondary | ICD-10-CM

## 2020-05-06 LAB — POCT GLYCOSYLATED HEMOGLOBIN (HGB A1C): HbA1c, POC (controlled diabetic range): 7.1 % — AB (ref 0.0–7.0)

## 2020-05-06 MED ORDER — INSULIN LISPRO PROT & LISPRO (75-25 MIX) 100 UNIT/ML KWIKPEN: 20.0000 [IU] | PEN_INJECTOR | Freq: Two times a day (BID) | SUBCUTANEOUS | 2 refills | Status: DC

## 2020-05-06 NOTE — Progress Notes (Signed)
05/06/2020  Endocrinology follow-up note   Subjective:    Patient ID: Diana Campbell, female    DOB: Oct 14, 1945, PCP Scherrie Bateman   Past Medical History:  Diagnosis Date  . Arthritis   . Automatic implantable cardioverter-defibrillator in situ 07/2010  . Cardiac abnormality    with defibrilator  . Chronic combined systolic and diastolic CHF (congestive heart failure) ([place]) 09/04/2015  . Diabetes mellitus   . H/O cardiac arrest 2006  . Hypercholesteremia   . Hypertension   . Neuropathy    Past Surgical History:  Procedure Laterality Date  . 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  Marland Kitchen CARDIAC CATHETERIZATION  04/14/2000   Normal coronary arteries.  Marland Kitchen CARDIAC DEFIBRILLATOR PLACEMENT  07/21/2010   Medtronic Protecta XT DR, model#[id number], serial#[id number]  . CARDIOVASCULAR STRESS TEST  04/05/2000   Scintigraphic evidence of mild LV dilatation and global LV dysfunction with an EF 41% with mild inferolateral ischemia.  Marland Kitchen CARPAL TUNNEL RELEASE Right 01/23/2016   Procedure: CARPAL TUNNEL RELEASE;  Surgeon: Carole Civil, MD;  Location: [facility];  Service: Orthopedics;  Laterality: Right;  pt knows to arrive at 6:15  . COLONOSCOPY  03/30/2011   Procedure: COLONOSCOPY;  Surgeon: Jamesetta So, MD;  Location: [facility];  Service: Gastroenterology;  Laterality: N/A;  . DORSAL COMPARTMENT RELEASE Right 08/11/2013   Procedure: RELEASE DORSAL COMPARTMENT (DEQUERVAIN);  Surgeon: Carole Civil, MD;  Location: [facility];  Service: Orthopedics;  Laterality: Right;  . ICD GENERATOR CHANGEOUT N/A 10/29/2017   Procedure: ICD GENERATOR CHANGEOUT;  Surgeon: Sanda Klein, MD;  Location: [place] [facility];  Service: Cardiovascular;  Laterality: N/A;  . TRANSTHORACIC ECHOCARDIOGRAM  08/03/2011   EF 45-50%, mildly reduced LV  systolic function.   Social History   Socioeconomic History  . Marital status: Married    Spouse name: Not on file  . Number of children: Not on file  . Years of education: Not on file  . Highest education level: Not on file  Occupational History  . Not on file  Tobacco Use  . Smoking status: Never Smoker  . Smokeless tobacco: Current User    Types: Snuff  . Tobacco comment: 3 dips per day  Vaping Use  . Vaping Use: Never used  Substance and Sexual Activity  . Alcohol use: No  . Drug use: No  . Sexual activity: Yes    Birth control/protection: None  Other Topics Concern  . Not on file  Social History Narrative  . Not on file   Social Determinants of Health   Financial Resource Strain: Not on file  Food Insecurity: Not on file  Transportation Needs: Not on file  Physical Activity: Not on file  Stress: Not on file  Social Connections: Not on file   Outpatient Encounter Medications as of 05/06/2020  Medication Sig  . ACCU-CHEK FASTCLIX LANCETS MISC 1 each by Does not apply route 2 (two) times daily.  Marland Kitchen ACCU-CHEK GUIDE test strip USE AS INSTRUCTED TO TEST BLOOD GLUCOSE FOUR TIMES DAILY  . acetaminophen (TYLENOL) 325 MG tablet Take 650 mg by mouth 3 (three) times daily.  Marland Kitchen aspirin EC 81 MG tablet Take 1 tablet (81 mg total) by mouth daily.  Marland Kitchen atorvastatin (LIPITOR) 20 MG tablet Take 20 mg by mouth daily.  . Blood Glucose Monitoring Suppl (ACCU-CHEK GUIDE) w/Device KIT Use as directed  to check blood glucose four times daily  . carvedilol (COREG) 12.5 MG tablet TAKE 1/2 TABLET BY MOUTH TWICE A DAY WITH A MEAL  . furosemide (LASIX) 40 MG tablet TAKE 1 TABLET (40 MG TOTAL) BY MOUTH DAILY AS NEEDED FOR FLUID.  . Insulin Lispro Prot & Lispro (HUMALOG MIX 75/25 KWIKPEN) (75-25) 100 UNIT/ML Kwikpen Inject 20-35 Units into the skin 2 (two) times daily before a meal.  . Lancets Misc. (ACCU-CHEK FASTCLIX LANCET) KIT 1 each by Does not apply route 2 (two) times daily.  .  sacubitril-valsartan (ENTRESTO) 49-51 MG Take 1 tablet by mouth 2 (two) times daily. (Patient not taking: Reported on 05/06/2020)  . [DISCONTINUED] HUMALOG MIX 75/25 KWIKPEN (75-25) 100 UNIT/ML Kwikpen INJECT 30-35 UNITS INTO THE SKIN 2 (TWO) TIMES DAILY BEFORE A MEAL.   No facility-administered encounter medications on file as of 05/06/2020.   ALLERGIES: No Known Allergies VACCINATION STATUS: Immunization History  Administered Date(s) Administered  . Influenza-Unspecified 11/19/2017    Diabetes She is returns for follow-up in the management of her currently uncontrolled type 2 diabetes, hyperlipidemia, hypertension.   Onset time: She was diagnosed with diabetes at approximately 75 years of age.   Her disease course has been fluctuating.  Her point-of-care A1c 7.1%, progressively improving.    She has done relatively better on premixed insulin twice a day. Pertinent negatives for hypoglycemia include no confusion, headaches, pallor or seizures. There are no diabetic associated symptoms. Pertinent negatives for diabetes include no chest pain, no polydipsia, no polyphagia and no polyuria. There are no hypoglycemic complications. Symptoms are worsening. Diabetic complications include nephropathy. Risk factors for coronary artery disease include diabetes mellitus, dyslipidemia, hypertension, sedentary lifestyle and tobacco exposure. Current diabetic treatment includes premixed insulin twice daily associated with monitoring of blood glucose at least 2 times a day.    -She presents with tightly controlled fasting glycemic profile, near target postprandial readings.  No hypoglycemia documented or reported.    Her weight is stable, she complains of poor appetite. She is following a generally unhealthy diet, regularly eats "junk food".  She has not had a previous visit with a dietitian (She declined a referral to CDE.)   An ACE inhibitor/angiotensin II receptor blocker is being taken. Eye exam is  current.    Hyperlipidemia This is a chronic problem. The current episode started more than 1 year ago. Pertinent negatives include no chest pain, myalgias or shortness of breath. Current antihyperlipidemic treatment includes statins. Risk factors for coronary artery disease include dyslipidemia, diabetes mellitus and a sedentary lifestyle.  Hypertension This is a chronic problem. The current episode started more than 1 year ago. The problem is controlled. Pertinent negatives include no chest pain, headaches, palpitations or shortness of breath. Past treatments include ACE inhibitors. Hypertensive end-organ damage includes kidney disease.    Review of systems: Limited as above.   Objective:    BP 140/62   Pulse 60   Ht 5' 6"  (1.676 m)   Wt 212 lb 3.2 oz (96.3 kg)   BMI 34.25 kg/m   Wt Readings from Last 3 Encounters:  05/06/20 212 lb 3.2 oz (96.3 kg)  04/03/20 209 lb 6.4 oz (95 kg)  03/04/20 207 lb 12.8 oz (94.3 kg)     Physical Exam- Limited  Constitutional:  Body mass index is 34.25 kg/m. , not in acute distress, normal state of mind Eyes:  EOMI, no exophthalmos    CMP Latest Ref Rng & Units 04/29/2020 04/03/2020 08/28/2019  Glucose 65 -  99 mg/dL 89 369(H) 188(H)  BUN 8 - 27 mg/dL 14 14 19   Creatinine 0.57 - 1.00 mg/dL 0.92 0.91 0.90  Sodium 134 - 144 mmol/L 146(H) 140 140  Potassium 3.5 - 5.2 mmol/L 4.1 4.4 4.7  Chloride 96 - 106 mmol/L 107(H) 101 106  CO2 20 - 29 mmol/L 24 24 30   Calcium 8.7 - 10.3 mg/dL 9.5 9.2 9.6  Total Protein 6.0 - 8.5 g/dL 6.8 - 7.2  Total Bilirubin 0.0 - 1.2 mg/dL 0.5 - 0.5  Alkaline Phos 44 - 121 IU/L 77 - -  AST 0 - 40 IU/L 13 - 11  ALT 0 - 32 IU/L 8 - 9    Lab Results  Component Value Date   HGBA1C 7.1 (A) 05/06/2020   HGBA1C 7.3 01/04/2020   HGBA1C 7.8 (A) 09/04/2019   Lipid Panel     Component Value Date/Time   CHOL 219 (H) 08/28/2019 0851   TRIG 65 08/28/2019 0851   HDL 62 08/28/2019 0851   CHOLHDL 3.5 08/28/2019 0851   VLDL  14 09/26/2015 0856   LDLCALC 141 (H) 08/28/2019 0851     Assessment & Plan:   1. Type 2 diabetes mellitus with long-term current use of insulin (HCC)  -Her diabetes is complicated by cardiomyopathy and she remains at a high risk for more acute and chronic complications of diabetes which include CAD, CVA, CKD, retinopathy, and neuropathy. These are all discussed in detail with the patient.  Patient presents with tightly controlled fasting glycemic profile, near target presupper readings.  Her point-of-care A1c of 7.1%, progressively improving from 8.2%.  No hypoglycemia reported or documented.     Recent labs reviewed.   - I have re-counseled the patient on diet management and weight loss  by adopting a carbohydrate restricted / protein rich  Diet.  - she acknowledges that there is a room for improvement in her food and drink choices. - Suggestion is made for her to avoid simple carbohydrates  from her diet including Cakes, Sweet Desserts, Ice Cream, Soda (diet and regular), Sweet Tea, Candies, Chips, Cookies, Store Bought Juices, Alcohol in Excess of  1-2 drinks a day, Artificial Sweeteners,  Coffee Creamer, and "Sugar-free" Products, Lemonade. This will help patient to have more stable blood glucose profile and potentially avoid unintended weight gain.   - Patient is advised to stick to a routine mealtimes to eat 3 meals  a day and avoid unnecessary snacks ( to snack only to correct hypoglycemia).  - The patient  declined referral to a  CDE for individualized DM education.  - I have approached patient with the following individualized plan to manage diabetes and patient agrees.   - Patient  worries a lot about hypoglycemia. -She has done relatively better on her current premixed insulin regimen.  She is advised to lower her Humalog 75/25 to 35 units at breakfast and 20 units at supper for premeal blood glucose readings above 90 mg per DL.   -She is advised and agrees to continue  monitoring of blood glucose 4 times a day-before meals and at bedtime.    -Patient is encouraged to call clinic for blood glucose levels less than 70 or above 200 mg /dl.  -Her labs show her renal function is improving. She would have benefited from metformin however she reports intolerance to metformin.   - Patient specific target  for A1c; LDL, HDL, Triglycerides,were discussed in detail.  2) BP/HTN:  -Her blood pressure is controlled to target.  She is advised to continue her current blood pressure medications including lisinopril 5 mg daily.      3) Lipids/HPL: Recent lipid panel showed controlled LDL at 64.  She is advised to continue atorvastatin 10 mg p.o. nightly.  Side effects and precautions discussed with her.    4)  Weight/Diet: Her BMI 31.3 -a candidate for modest weight loss.  She declined CDE consult . exercise, and carbohydrates information provided.  5) Chronic Care/Health Maintenance:  -Patient is on ACEI/ARB and Statin medications and encouraged to continue to follow up with Ophthalmology, Podiatrist at least yearly or according to recommendations, and advised to  stay away from smoking. I have recommended yearly flu vaccine and pneumonia vaccination at least every 5 years; moderate intensity exercise for up to 150 minutes weekly; and  sleep for at least 7 hours a day.   - I advised patient to maintain close follow up with Jake Samples, PA-C for primary care needs.    I spent 41 minutes in the care of the patient today including review of labs from [place], Lipids, Thyroid Function, Hematology (current and previous including abstractions from other facilities); face-to-face time discussing  her blood glucose readings/logs, discussing hypoglycemia and hyperglycemia episodes and symptoms, medications doses, her options of short and long term treatment based on the latest standards of care / guidelines;  discussion about incorporating lifestyle medicine;  and documenting  the encounter.    Please refer to Patient Instructions for Blood Glucose Monitoring and Insulin/Medications Dosing Guide"  in media tab for additional information. Please  also refer to " Patient Self Inventory" in the Media  tab for reviewed elements of pertinent patient history.  Sicily Golda Acre participated in the discussions, expressed understanding, and voiced agreement with the above plans.  All questions were answered to her satisfaction. she is encouraged to contact clinic should she have any questions or concerns prior to her return visit.      Follow up plan: -Return in about 4 months (around 09/05/2020) for Bring Meter and Logs- A1c in Office.  Glade Lloyd, MD Phone: [phone]  Fax: [phone]  -  This note was partially dictated with voice recognition software. Similar sounding words can be transcribed inadequately or may not  be corrected upon review.  05/06/2020, 12:52 PM

## 2020-05-06 NOTE — Patient Instructions (Signed)

## 2020-05-15 NOTE — Progress Notes (Signed)
Remote ICD transmission.   

## 2020-05-25 ENCOUNTER — Other Ambulatory Visit: Payer: Self-pay | Admitting: "Endocrinology

## 2020-05-27 ENCOUNTER — Ambulatory Visit (INDEPENDENT_AMBULATORY_CARE_PROVIDER_SITE_OTHER): Payer: Medicare PPO

## 2020-05-27 DIAGNOSIS — Z95810 Presence of automatic (implantable) cardiac defibrillator: Secondary | ICD-10-CM | POA: Diagnosis not present

## 2020-05-27 DIAGNOSIS — I5042 Chronic combined systolic (congestive) and diastolic (congestive) heart failure: Secondary | ICD-10-CM | POA: Diagnosis not present

## 2020-05-28 ENCOUNTER — Telehealth: Payer: Self-pay

## 2020-05-28 NOTE — Telephone Encounter (Signed)
Remote ICM transmission received.  Attempted call to patient regarding ICM remote transmission and no answer or answering machine. 

## 2020-05-28 NOTE — Progress Notes (Signed)
Thank you :)

## 2020-05-28 NOTE — Progress Notes (Signed)
EPIC Encounter for ICM Monitoring  Patient Name: Diana Campbell is a 75 y.o. female Date: 05/28/2020 Primary Care Physican: Ladon Applebaum Primary Cardiologist:Kelly Electrophysiologist:Croitoru 05/06/2020 OfficeWeight: 212lbs   Attempted call to patient and unable to reach.  Transmission reviewed.   Optivol thoracic impedancesuggestingnormal fluid levels.  Prescribed:Furosemide 40 mg take 1 tablet as needed for fluid  Labs: 08/28/2019 Creatinine0.90, BUN19, Potassium4.7, Sodium140, TKP54-65 A complete set of results can be found in Results Review.  Recommendations:Unable to reach.    Follow-up plan: ICM clinic phone appointment on 07/08/2020. 91 day device clinic remote transmission7/13/2022.   EP/Cardiology Office Visits:Recall2/14/2023with Dr Royann Shivers. 07/25/2020 with Dr Tresa Endo.  Copy of ICM check sent to Dr.Croitoru  3 month ICM trend: 05/28/2020.    1 Year ICM trend:       [name], RN 05/28/2020 8:32 AM

## 2020-06-25 DIAGNOSIS — E6609 Other obesity due to excess calories: Secondary | ICD-10-CM | POA: Diagnosis not present

## 2020-06-25 DIAGNOSIS — J22 Unspecified acute lower respiratory infection: Secondary | ICD-10-CM | POA: Diagnosis not present

## 2020-06-25 DIAGNOSIS — G894 Chronic pain syndrome: Secondary | ICD-10-CM | POA: Diagnosis not present

## 2020-06-25 DIAGNOSIS — Z6833 Body mass index (BMI) 33.0-33.9, adult: Secondary | ICD-10-CM | POA: Diagnosis not present

## 2020-06-25 DIAGNOSIS — M1991 Primary osteoarthritis, unspecified site: Secondary | ICD-10-CM | POA: Diagnosis not present

## 2020-07-08 ENCOUNTER — Ambulatory Visit (INDEPENDENT_AMBULATORY_CARE_PROVIDER_SITE_OTHER): Payer: Medicare PPO

## 2020-07-08 DIAGNOSIS — I5042 Chronic combined systolic (congestive) and diastolic (congestive) heart failure: Secondary | ICD-10-CM | POA: Diagnosis not present

## 2020-07-08 DIAGNOSIS — Z95810 Presence of automatic (implantable) cardiac defibrillator: Secondary | ICD-10-CM | POA: Diagnosis not present

## 2020-07-09 ENCOUNTER — Telehealth: Payer: Self-pay

## 2020-07-09 NOTE — Telephone Encounter (Signed)
Remote ICM transmission received.  Attempted call to patient regarding ICM remote transmission and left detailed message per DPR.  Advised to return call for any fluid symptoms or questions. Next ICM remote transmission scheduled 08/19/2020.       

## 2020-07-09 NOTE — Progress Notes (Signed)
EPIC Encounter for ICM Monitoring  Patient Name: Diana Campbell is a 75 y.o. female Date: 07/09/2020 Primary Care Physican: Ladon Applebaum Primary Cardiologist: Tresa Endo Electrophysiologist: Croitoru 05/06/2020 Office Weight: 212 lbs                                                            Attempted call to patient and unable to reach.  Transmission reviewed.    Optivol thoracic impedance normal but was suggesting possible fluid accumulation from 06/08/2020 - 07/07/2020    Prescribed: Furosemide 40 mg take 1 tablet as needed for fluid   Labs: 08/28/2019 Creatinine 0.90, BUN 19, Potassium 4.7, Sodium 140, GFR 63-72 A complete set of results can be found in Results Review.   Recommendations:  Unable to reach.     Follow-up plan: ICM clinic phone appointment on 08/19/2020.   91 day device clinic remote transmission 07/31/2020.     EP/Cardiology Office Visits:  Recall 03/04/2021 with Dr Royann Shivers.  07/25/2020 with Dr Tresa Endo.     Copy of ICM check sent to Dr. Royann Shivers.   3 month ICM trend: 07/08/2020.    1 Year ICM trend:       [name], RN 07/09/2020 3:47 PM

## 2020-07-25 ENCOUNTER — Ambulatory Visit: Payer: Medicare PPO | Admitting: Cardiovascular Disease

## 2020-07-25 ENCOUNTER — Other Ambulatory Visit: Payer: Self-pay

## 2020-07-25 DIAGNOSIS — I10 Essential (primary) hypertension: Secondary | ICD-10-CM | POA: Diagnosis not present

## 2020-07-25 DIAGNOSIS — E782 Mixed hyperlipidemia: Secondary | ICD-10-CM

## 2020-07-25 DIAGNOSIS — Z79899 Other long term (current) drug therapy: Secondary | ICD-10-CM | POA: Diagnosis not present

## 2020-07-25 DIAGNOSIS — I5042 Chronic combined systolic (congestive) and diastolic (congestive) heart failure: Secondary | ICD-10-CM

## 2020-07-25 DIAGNOSIS — Z794 Long term (current) use of insulin: Secondary | ICD-10-CM

## 2020-07-25 DIAGNOSIS — E118 Type 2 diabetes mellitus with unspecified complications: Secondary | ICD-10-CM

## 2020-07-25 DIAGNOSIS — I428 Other cardiomyopathies: Secondary | ICD-10-CM

## 2020-07-25 DIAGNOSIS — Z95810 Presence of automatic (implantable) cardiac defibrillator: Secondary | ICD-10-CM | POA: Diagnosis not present

## 2020-07-25 MED ORDER — ENTRESTO 24-26 MG PO TABS: 1.0000 | ORAL_TABLET | Freq: Two times a day (BID) | ORAL | 1 refills | Status: DC

## 2020-07-25 MED ORDER — EMPAGLIFLOZIN 10 MG PO TABS: 10.0000 mg | ORAL_TABLET | Freq: Every day | ORAL | 0 refills | Status: DC

## 2020-07-25 MED ORDER — EMPAGLIFLOZIN 10 MG PO TABS: 10.0000 mg | ORAL_TABLET | Freq: Every day | ORAL | 3 refills | Status: DC

## 2020-07-25 NOTE — Progress Notes (Signed)
Patient ID: Diana Campbell, female   DOB: 1945/11/07, 75 y.o.   MRN: [id number]     HPI: Diana Campbell is a 75 y.o. female who presents to the office for a cardiology evaluation.  I have not seen her since March 2019.  Diana Campbell suffered a ventricular fibrillation cardiac arrest in June 2005 at which time she was successfully resuscitated. She was found to have a nonischemic cardiomyopathy;  initial ejection fraction 15%. Catheterization revealed 30-40% intermediate stenosis. At that time, she underwent single-chamber Medtronic ICD implantation and initially had a spring fidelis lead. In July 2012 she underwent complete revision of her system.  An echo Doppler study in July 2013 showed an ejection fraction now at 45-50% with mild global hypokinesis. She did have mild MR, mild TR, and mild aortic sclerosis.  Additional problems include type 2 diabetes mellitus on insulin, hypertension, hyperlipidemia, and probable untreated obstructive sleep apnea.  In the past she has had some mild edema.  She has been followed by Dr. Sallyanne Kuster for her defibrillator.  In September 2018  ICD interrogation showed normal device function, normal lead parameters, no episodes of ventricular tachycardia and she was atrially pacing 86% of the time.  There was no ventricular pacing.  She had a favorable heart rate histogram and thoracic impedance was fairly stable but at times she had some evidence for fluid overload.  When I last saw her in March 2019, she denied any episodes of chest pain.  She was experiencing mild shortness of breath particularly when she bends over and also admitted to some mild ankle swelling.  She has chronic low back pain and for this reason she does not exercise.  Since it had been 6 years since her last echo Doppler study I recommended she undergo a follow-up echocardiographic evaluation.  He echo Doppler study showed an EF of 40 to 45% with mild LV dilation and diffuse hypokinesis.  There is no  significant valvular abnormalities.  Since I last saw her, she underwent elective generator change out for her ICD in October 2019.  She has an abandoned lead and unfortunately cannot have MRIs even though she has an MRI conditional system.  She was hospitalized January 2020 with sepsis secondary to UTI.  She has continued to be followed by Dr. Sallyanne Kuster for the next.  Subsequently, she has been evaluated by Diana Campbell in 2020, Diana Campbell, Diana Campbell she has continued to see Dr. Sallyanne Kuster.  She has been transitioned to Diana Campbell and has been treated with carvedilol and loop diuretic.  She was seen by Diana Campbell, Diana Campbell most recently in March 2022.  At that time, the patient wanted to be off Entresto but further discussion was made and the patient was willing to continue Diana Campbell for another month.  She felt the [name] was causing her blood pressure to go up but apparently her diet was poor and she was eating cookies and soda for breakfast and continue to eat hotdogs and food high in sodium most likely contributing to her blood pressure elevation.  She subsequently stopped her Entresto.  She denies chest pain PND or orthopnea.  When last seen by Dr. Sallyanne Kuster there was some discussion about possible SGLT2 inhibition.  She presents for cardiology follow-up evaluation.  Past Medical History:  Diagnosis Date   Arthritis    Automatic implantable cardioverter-defibrillator in situ 07/2010   Cardiac abnormality    with defibrilator   Chronic combined systolic and diastolic CHF (congestive heart failure) ([place]) 09/04/2015  Diabetes mellitus    H/O cardiac arrest 2006   Hypercholesteremia    Hypertension    Neuropathy     Past Surgical History:  Procedure Laterality Date   CARDIAC CATHETERIZATION  06/22/2003   Severe nonischemic cardiomyopathy, 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   CARDIAC CATHETERIZATION   04/14/2000   Normal coronary arteries.   CARDIAC DEFIBRILLATOR PLACEMENT  07/21/2010   Medtronic Protecta XT DR, model#[id number], serial#[id number] h   CARDIOVASCULAR STRESS TEST  04/05/2000   Scintigraphic evidence of mild LV dilatation and global LV dysfunction with an EF 41% with mild inferolateral ischemia.   CARPAL TUNNEL RELEASE Right 01/23/2016   Procedure: CARPAL TUNNEL RELEASE;  Surgeon: Carole Civil, MD;  Location: [facility];  Service: Orthopedics;  Laterality: Right;  pt knows to arrive at 6:15   COLONOSCOPY  03/30/2011   Procedure: COLONOSCOPY;  Surgeon: Jamesetta So, MD;  Location: [facility];  Service: Gastroenterology;  Laterality: N/A;   DORSAL COMPARTMENT RELEASE Right 08/11/2013   Procedure: RELEASE DORSAL COMPARTMENT (DEQUERVAIN);  Surgeon: Carole Civil, MD;  Location: [facility];  Service: Orthopedics;  Laterality: Right;   ICD GENERATOR CHANGEOUT N/A 10/29/2017   Procedure: ICD GENERATOR CHANGEOUT;  Surgeon: Sanda Klein, MD;  Location: [facility];  Service: Cardiovascular;  Laterality: N/A;   TRANSTHORACIC ECHOCARDIOGRAM  08/03/2011   EF 45-50%, mildly reduced LV systolic function.    Not on File  Current Outpatient Medications  Medication Sig Dispense Refill   ACCU-CHEK FASTCLIX LANCETS MISC 1 each by Does not apply route 2 (two) times daily. 200 each 5   ACCU-CHEK GUIDE test strip USE AS INSTRUCTED TO TEST BLOOD GLUCOSE FOUR TIMES DAILY 400 strip 1   acetaminophen (TYLENOL) 325 MG tablet Take 650 mg by mouth 3 (three) times daily.     aspirin EC 81 MG tablet Take 1 tablet (81 mg total) by mouth daily. 90 tablet 3   atorvastatin (LIPITOR) 20 MG tablet Take 20 mg by mouth daily.     Blood Glucose Monitoring Suppl (ACCU-CHEK GUIDE) w/Device KIT Use as directed to check blood glucose four times daily 1 kit 0   carvedilol (COREG) 12.5 MG tablet TAKE 1/2 TABLET BY MOUTH TWICE A DAY WITH A MEAL 90 tablet 2   HUMALOG MIX 75/25 KWIKPEN (75-25) 100 UNIT/ML Kwikpen  INJECT 30-35 UNITS INTO THE SKIN 2 (TWO) TIMES DAILY BEFORE A MEAL. 30 mL 2   Lancets Misc. (ACCU-CHEK FASTCLIX LANCET) KIT 1 each by Does not apply route 2 (two) times daily. 1 kit 2   sacubitril-valsartan (ENTRESTO) 24-26 MG Take 1 tablet by mouth 2 (two) times daily. 60 tablet 1   empagliflozin (JARDIANCE) 10 MG TABS tablet Take 1 tablet (10 mg total) by mouth daily. 90 tablet 3   No current facility-administered medications for this visit.    Social History   Socioeconomic History   Marital status: Married    Spouse name: Not on file   Number of children: Not on file   Years of education: Not on file   Highest education level: Not on file  Occupational History   Not on file  Tobacco Use   Smoking status: Never   Smokeless tobacco: Current    Types: Snuff   Tobacco comments:    3 dips per day  Vaping Use   Vaping Use: Never used  Substance and Sexual Activity   Alcohol use: No  Drug use: No   Sexual activity: Yes    Birth control/protection: None  Other Topics Concern   Not on file  Social History Narrative   Not on file   Social Determinants of Health   Financial Resource Strain: Not on file  Food Insecurity: Not on file  Transportation Needs: Not on file  Physical Activity: Not on file  Stress: Not on file  Social Connections: Not on file  Intimate Partner Violence: Not on file   Socially she is married and has 4 children. She has a new grand child.  Parents are deceased  ROS General: Negative; No fevers, chills, or night sweats; positive for fatigue HEENT: Negative; No changes in vision or hearing, sinus congestion, difficulty swallowing Pulmonary: Negative; No cough, wheezing, shortness of breath, hemoptysis Cardiovascular: see HPI GI: Negative; No nausea, vomiting, diarrhea, or abdominal pain GU: Negative; No dysuria, hematuria, or difficulty voiding Musculoskeletal: Low back discomfort Hematologic/Oncology: Negative; no easy bruising,  bleeding Endocrine: Positive for diabetes mellitus Neuro: Negative; no changes in balance, headaches Skin: Negative; No rashes or skin lesions Psychiatric: Negative; No behavioral problems, depression Sleep: Probable untreated sleep apnea with history of snoring, fatigue, frequent awakenings.  She did not undergo her recommended sleep study. Other comprehensive 14 point system review is negative.   PE BP 138/60   Pulse 62   Ht 5' 6"  (1.676 m)   Wt 205 lb (93 kg)   BMI 33.09 kg/m    Repeat blood pressure by me was 140/62.  Wt Readings from Last 3 Encounters:  07/25/20 205 lb (93 kg)  05/06/20 212 lb 3.2 oz (96.3 kg)  04/03/20 209 lb 6.4 oz (95 kg)   General: Alert, oriented, no distress.  Skin: normal turgor, no rashes, warm and dry HEENT: Normocephalic, atraumatic. Pupils equal round and reactive to light; sclera anicteric; extraocular muscles intact;  Nose without nasal septal hypertrophy Mouth/Parynx benign; Mallinpatti scale 3 Neck: No JVD, no carotid bruits; normal carotid upstroke Lungs: clear to ausculatation and percussion; no wheezing or rales Chest wall: without tenderness to palpitation Heart: PMI not displaced, RRR, s1 s2 normal, 1/6 systolic murmur, no diastolic murmur, no rubs, gallops, thrills, or heaves Abdomen: soft, nontender; no hepatosplenomehaly, BS+; abdominal aorta nontender and not dilated by palpation. Back: no CVA tenderness Pulses 2+ Musculoskeletal: full range of motion, normal strength, no joint deformities Extremities: no clubbing cyanosis or edema, Homan's sign negative  Neurologic: grossly nonfocal; Cranial nerves grossly wnl Psychologic: Normal mood and affect   ECG (independently read by me): Atrial paced at 62  March 2019 ECG (independently read by me): Telemetry paced rhythm at 63 bpm.  PR interval 206 ms.  March 2017 ECG (independently read by me): Atrially paced rhythm at 61 bpm.  December 2014 ECG: Atrial lead paced rhythm at 79  beats per minute. PRWP.  Isolated PVC.  LABS:  BMP Latest Ref Rng & Units 04/29/2020 04/03/2020 08/28/2019  Glucose 65 - 99 mg/dL 89 369(H) 188(H)  BUN 8 - 27 mg/dL 14 14 19   Creatinine 0.57 - 1.00 mg/dL 0.92 0.91 0.90  BUN/Creat Ratio 12 - 28 15 15  NOT APPLICABLE  Sodium 025 - 144 mmol/L 146(H) 140 140  Potassium 3.5 - 5.2 mmol/L 4.1 4.4 4.7  Chloride 96 - 106 mmol/L 107(H) 101 106  CO2 20 - 29 mmol/L 24 24 30   Calcium 8.7 - 10.3 mg/dL 9.5 9.2 9.6   Hepatic Function Latest Ref Rng & Units 04/29/2020 08/28/2019 12/27/2018  Total Protein 6.0 -  8.5 g/dL 6.8 7.2 6.7  Albumin 3.7 - 4.7 g/dL 4.1 - -  AST 0 - 40 IU/L 13 11 13   ALT 0 - 32 IU/L 8 9 7   Alk Phosphatase 44 - 121 IU/L 77 - -  Total Bilirubin 0.0 - 1.2 mg/dL 0.5 0.5 0.4   CBC Latest Ref Rng & Units 01/31/2018 01/30/2018 01/29/2018  WBC 4.0 - 10.5 K/uL 14.3(H) 16.4(H) 18.1(H)  Hemoglobin 12.0 - 15.0 g/dL 11.5(L) 11.2(L) 10.9(L)  Hematocrit 36.0 - 46.0 % 36.3 36.2 35.6(L)  Platelets 150 - 400 K/uL 310 255 231   Lab Results  Component Value Date   MCV 77.2 (L) 01/31/2018   MCV 77.4 (L) 01/30/2018   MCV 77.6 (L) 01/29/2018   Lab Results  Component Value Date   TSH 1.40 08/28/2019   Lab Results  Component Value Date   HGBA1C 7.1 (A) 05/06/2020   Lipid Panel     Component Value Date/Time   CHOL 219 (H) 08/28/2019 0851   TRIG 65 08/28/2019 0851   HDL 62 08/28/2019 0851   CHOLHDL 3.5 08/28/2019 0851   VLDL 14 09/26/2015 0856   LDLCALC 141 (H) 08/28/2019 0851     RADIOLOGY: No results found.  IMPRESSION:  1. Cardiomyopathy, nonischemic ([place])   2. Chronic combined systolic and diastolic CHF (congestive heart failure) ([place])   3. Essential hypertension   4. Automatic implantable cardioverter-defibrillator in situ   5. Type 2 diabetes mellitus with complication, with long-term current use of insulin ([place])   6. Mixed hyperlipidemia   7. Medication management     ASSESSMENT AND PLAN: Diana Campbell is a 75 year old  African-American female who is status post a ventricular fibrillation cardiac arrest in June 2005 at which time she was found to have an ejection fraction of 15% and felt to have a nonischemic cardiomyopathy. She has a Nutritional therapist XT DR defibrillator in place. An echo Doppler study was done in July 2013 showed an ejection fraction of 45-50%.  She had been on lisinopril, as needed hydrochlorothiazide in the past.  Her last echo Doppler study in March 2019 was showed an EF of 40 to 45% with mild LV dilation and diffuse hypokinesis.  She did not have significant valvular abnormalities.  I have not seen her since 2019.  In October 2019 she had a generator change out.  There was an option for future CRT upgrade if clinical status deteriorates.  Apparently, over the past year she was transitioned to Diana Campbell.  When last seen in March 2022 by Diana Alas.D. she was on Entresto 49/51 mg twice a day, carvedilol 6.25 mg twice a day, furosemide 40 mg daily in addition to atorvastatin 20 mg, Humalog insulin.  Apparently, since that evaluation is planned and has stopped Entresto.  Medications now include carvedilol 6.25 mg twice a day, aspirin 81 mg in addition to atorvastatin and Humalog insulin.  Her blood pressure today is 140/62.  I have recommended resumption of low-dose Entresto and I provided her with samples of 20/26 mg to take twice a day.  I have also providing her with samples of Jardiance 10 mg for SGLT2 inhibition.  I have recommended she see Diana Campbell, Pharm.D. in 6 to 8 weeks.  Prior to that evaluation she will undergo a comprehensive metabolic panel and proBNP.  I will see her in 4 months for follow-up evaluation or sooner as needed.  Troy Sine, MD, [facility]  07/28/2020 4:36 PM

## 2020-07-25 NOTE — Patient Instructions (Signed)
Medication Instructions:  RESTART Entresto 24/26 mg two times daily START Jardiance 10 mg once daily  *If you need a refill on your cardiac medications before your next appointment, please call your pharmacy*   Lab Work: Please return for labs in 6 weeks (CMET,ProBNP)  Our in office lab hours are Monday-Friday 8:00-4:00, closed for lunch 12:45-1:45 pm.  No appointment needed.  Follow-Up: At [facility], you and your health needs are our priority.  As part of our continuing mission to provide you with exceptional heart care, we have created designated Provider Care Teams.  These Care Teams include your primary Cardiologist (physician) and Advanced Practice Providers (APPs -  Physician Assistants and Nurse Practitioners) who all work together to provide you with the care you need, when you need it.  We recommend signing up for the patient portal called "MyChart".  Sign up information is provided on this After Visit Summary.  MyChart is used to connect with patients for Virtual Visits (Telemedicine).  Patients are able to view lab/test results, encounter notes, upcoming appointments, etc.  Non-urgent messages can be sent to your provider as well.   To learn more about what you can do with MyChart, go to [email or web address].    Your next appointment:   2 months with pharmacist 4 months with Dr. Tresa Endo

## 2020-07-28 ENCOUNTER — Encounter: Payer: Self-pay | Admitting: Cardiovascular Disease

## 2020-07-31 ENCOUNTER — Other Ambulatory Visit: Payer: Self-pay | Admitting: Cardiovascular Disease

## 2020-07-31 ENCOUNTER — Ambulatory Visit (INDEPENDENT_AMBULATORY_CARE_PROVIDER_SITE_OTHER): Payer: Medicare PPO

## 2020-07-31 ENCOUNTER — Other Ambulatory Visit: Payer: Self-pay | Admitting: "Endocrinology

## 2020-07-31 DIAGNOSIS — E1165 Type 2 diabetes mellitus with hyperglycemia: Secondary | ICD-10-CM

## 2020-07-31 DIAGNOSIS — IMO0002 Reserved for concepts with insufficient information to code with codable children: Secondary | ICD-10-CM

## 2020-07-31 DIAGNOSIS — I428 Other cardiomyopathies: Secondary | ICD-10-CM | POA: Diagnosis not present

## 2020-07-31 LAB — CUP PACEART REMOTE DEVICE CHECK
Battery Remaining Longevity: 89 mo
Battery Voltage: 2.99 V
Brady Statistic AP VP Percent: 0.03 %
Brady Statistic AP VS Percent: 90.02 %
Brady Statistic AS VP Percent: 0.01 %
Brady Statistic AS VS Percent: 9.94 %
Brady Statistic RA Percent Paced: 89.44 %
Brady Statistic RV Percent Paced: 0.04 %
Date Time Interrogation Session: 20220713033522
HighPow Impedance: 41 Ohm
HighPow Impedance: 48 Ohm
Implantable Lead Implant Date: 20120702
Implantable Lead Implant Date: 20120702
Implantable Lead Location: 753859
Implantable Lead Location: 753860
Implantable Lead Model: 5076
Implantable Lead Model: 6947
Implantable Pulse Generator Implant Date: 20191011
Lead Channel Impedance Value: 342 Ohm
Lead Channel Impedance Value: 399 Ohm
Lead Channel Impedance Value: 437 Ohm
Lead Channel Pacing Threshold Amplitude: 0.75 V
Lead Channel Pacing Threshold Amplitude: 1 V
Lead Channel Pacing Threshold Pulse Width: 0.4 ms
Lead Channel Pacing Threshold Pulse Width: 0.4 ms
Lead Channel Sensing Intrinsic Amplitude: 12 mV
Lead Channel Sensing Intrinsic Amplitude: 12 mV
Lead Channel Sensing Intrinsic Amplitude: 2.375 mV
Lead Channel Sensing Intrinsic Amplitude: 2.375 mV
Lead Channel Setting Pacing Amplitude: 1.5 V
Lead Channel Setting Pacing Amplitude: 2 V
Lead Channel Setting Pacing Pulse Width: 0.4 ms
Lead Channel Setting Sensing Sensitivity: 0.3 mV

## 2020-08-08 DIAGNOSIS — Z79899 Other long term (current) drug therapy: Secondary | ICD-10-CM | POA: Diagnosis not present

## 2020-08-08 DIAGNOSIS — Z794 Long term (current) use of insulin: Secondary | ICD-10-CM | POA: Diagnosis not present

## 2020-08-08 DIAGNOSIS — H524 Presbyopia: Secondary | ICD-10-CM | POA: Diagnosis not present

## 2020-08-08 DIAGNOSIS — Z961 Presence of intraocular lens: Secondary | ICD-10-CM | POA: Diagnosis not present

## 2020-08-08 DIAGNOSIS — I5042 Chronic combined systolic (congestive) and diastolic (congestive) heart failure: Secondary | ICD-10-CM | POA: Diagnosis not present

## 2020-08-08 DIAGNOSIS — E119 Type 2 diabetes mellitus without complications: Secondary | ICD-10-CM | POA: Diagnosis not present

## 2020-08-09 LAB — COMPREHENSIVE METABOLIC PANEL
ALT: 6 IU/L (ref 0–32)
AST: 13 IU/L (ref 0–40)
Albumin/Globulin Ratio: 1.6 (ref 1.2–2.2)
Albumin: 4.1 g/dL (ref 3.7–4.7)
Alkaline Phosphatase: 73 IU/L (ref 44–121)
BUN/Creatinine Ratio: 13 (ref 12–28)
BUN: 14 mg/dL (ref 8–27)
Bilirubin Total: 0.2 mg/dL (ref 0.0–1.2)
CO2: 35 mmol/L — ABNORMAL HIGH (ref 20–29)
Calcium: 9.6 mg/dL (ref 8.7–10.3)
Chloride: 109 mmol/L — ABNORMAL HIGH (ref 96–106)
Creatinine, Ser: 1.09 mg/dL — ABNORMAL HIGH (ref 0.57–1.00)
Globulin, Total: 2.6 g/dL (ref 1.5–4.5)
Glucose: 147 mg/dL — ABNORMAL HIGH (ref 65–99)
Potassium: 4.5 mmol/L (ref 3.5–5.2)
Sodium: 147 mmol/L — ABNORMAL HIGH (ref 134–144)
Total Protein: 6.7 g/dL (ref 6.0–8.5)
eGFR: 53 mL/min/{1.73_m2} — ABNORMAL LOW (ref >59)

## 2020-08-09 LAB — PRO B NATRIURETIC PEPTIDE: NT-Pro BNP: 797 pg/mL — ABNORMAL HIGH (ref 0–738)

## 2020-08-19 ENCOUNTER — Ambulatory Visit (INDEPENDENT_AMBULATORY_CARE_PROVIDER_SITE_OTHER): Payer: Medicare PPO

## 2020-08-19 DIAGNOSIS — Z95810 Presence of automatic (implantable) cardiac defibrillator: Secondary | ICD-10-CM

## 2020-08-19 DIAGNOSIS — I5042 Chronic combined systolic (congestive) and diastolic (congestive) heart failure: Secondary | ICD-10-CM | POA: Diagnosis not present

## 2020-08-20 ENCOUNTER — Telehealth: Payer: Self-pay

## 2020-08-20 NOTE — Telephone Encounter (Signed)
Remote ICM transmission received.  Attempted call to patient regarding ICM remote transmission and left detailed message per DPR.  Advised to return call for any fluid symptoms or questions. Next ICM remote transmission scheduled 09/24/2020.    

## 2020-08-20 NOTE — Progress Notes (Signed)
EPIC Encounter for ICM Monitoring  Patient Name: Diana Campbell is a 75 y.o. female Date: 08/20/2020 Primary Care Physican: Ladon Applebaum Primary Cardiologist: Tresa Endo Electrophysiologist: Croitoru 07/25/2020 Office Weight: 205 lbs                                                            Attempted call to patient and unable to reach.  Transmission reviewed.    Optivol thoracic impedance suggesting normal fluid levels.    Prescribed: No diuretic (Previously on Furosemide 40 mg 1 tablet as needed)   Labs: 08/08/2020 Creatinine 1.09, BUN 14, Potassium 4.5, Sodium 14777, GFR 53 04/29/2020 Creatinine 0.92, BUN 14, Potassium 4.1, Sodium 146, GFR 65  04/03/2020 Creatinine 0.91, BUN 14, Potassium 4.4, Sodium 140, GFR 65  A complete set of results can be found in Results Review.   Recommendations:  Unable to reach.     Follow-up plan: ICM clinic phone appointment on 09/24/2020.   91 day device clinic remote transmission 10/30/2020.     EP/Cardiology Office Visits:  Recall 03/04/2021 with Dr Royann Shivers.  12/02/2020 with Dr Tresa Endo.     Copy of ICM check sent to Dr. Royann Shivers.   3 month ICM trend: 08/19/2020.    1 Year ICM trend:       [name], RN 08/20/2020 9:54 AM

## 2020-08-22 NOTE — Progress Notes (Signed)
Remote ICD transmission.   

## 2020-09-09 ENCOUNTER — Other Ambulatory Visit: Payer: Self-pay

## 2020-09-09 ENCOUNTER — Ambulatory Visit: Payer: Medicare PPO | Admitting: "Endocrinology

## 2020-09-09 ENCOUNTER — Encounter: Payer: Self-pay | Admitting: "Endocrinology

## 2020-09-09 VITALS — BP 120/62 | HR 72 | Ht 66.0 in | Wt 203.4 lb

## 2020-09-09 DIAGNOSIS — I10 Essential (primary) hypertension: Secondary | ICD-10-CM

## 2020-09-09 DIAGNOSIS — Z794 Long term (current) use of insulin: Secondary | ICD-10-CM | POA: Diagnosis not present

## 2020-09-09 DIAGNOSIS — E1165 Type 2 diabetes mellitus with hyperglycemia: Secondary | ICD-10-CM

## 2020-09-09 DIAGNOSIS — E782 Mixed hyperlipidemia: Secondary | ICD-10-CM

## 2020-09-09 DIAGNOSIS — IMO0002 Reserved for concepts with insufficient information to code with codable children: Secondary | ICD-10-CM

## 2020-09-09 DIAGNOSIS — E118 Type 2 diabetes mellitus with unspecified complications: Secondary | ICD-10-CM | POA: Diagnosis not present

## 2020-09-09 LAB — POCT GLYCOSYLATED HEMOGLOBIN (HGB A1C): HbA1c, POC (controlled diabetic range): 6.4 % (ref 0.0–7.0)

## 2020-09-09 MED ORDER — INSULIN LISPRO PROT & LISPRO (75-25 MIX) 100 UNIT/ML KWIKPEN: 20.0000 [IU] | PEN_INJECTOR | Freq: Two times a day (BID) | SUBCUTANEOUS | 1 refills | Status: DC

## 2020-09-09 NOTE — Progress Notes (Signed)
09/09/2020  Endocrinology follow-up note   Subjective:    Patient ID: Diana Campbell, female    DOB: March 10, 1945, PCP Jake Samples, PA-C   Past Medical History:  Diagnosis Date   Arthritis    Automatic implantable cardioverter-defibrillator in situ 07/2010   Cardiac abnormality    with defibrilator   Chronic combined systolic and diastolic CHF (congestive heart failure) ([place]) 09/04/2015   Diabetes mellitus    H/O cardiac arrest 2006   Hypercholesteremia    Hypertension    Neuropathy    Past Surgical History:  Procedure Laterality Date   CARDIAC CATHETERIZATION  06/22/2003   Severe nonischemic cardiomyopathy, 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   CARDIAC CATHETERIZATION  04/14/2000   Normal coronary arteries.   CARDIAC DEFIBRILLATOR PLACEMENT  07/21/2010   Medtronic Protecta XT DR, model#[id number], serial#[id number]   CARDIOVASCULAR STRESS TEST  04/05/2000   Scintigraphic evidence of mild LV dilatation and global LV dysfunction with an EF 41% with mild inferolateral ischemia.   CARPAL TUNNEL RELEASE Right 01/23/2016   Procedure: CARPAL TUNNEL RELEASE;  Surgeon: Carole Civil, MD;  Location: [facility];  Service: Orthopedics;  Laterality: Right;  pt knows to arrive at 6:15   COLONOSCOPY  03/30/2011   Procedure: COLONOSCOPY;  Surgeon: Jamesetta So, MD;  Location: [facility];  Service: Gastroenterology;  Laterality: N/A;   DORSAL COMPARTMENT RELEASE Right 08/11/2013   Procedure: RELEASE DORSAL COMPARTMENT (DEQUERVAIN);  Surgeon: Carole Civil, MD;  Location: [facility];  Service: Orthopedics;  Laterality: Right;   ICD GENERATOR CHANGEOUT N/A 10/29/2017   Procedure: ICD GENERATOR CHANGEOUT;  Surgeon: Sanda Klein, MD;  Location: [facility];  Service: Cardiovascular;  Laterality: N/A;   TRANSTHORACIC ECHOCARDIOGRAM  08/03/2011   EF 45-50%, mildly reduced LV systolic function.    Social History   Socioeconomic History   Marital status: Married    Spouse name: Not on file   Number of children: Not on file   Years of education: Not on file   Highest education level: Not on file  Occupational History   Not on file  Tobacco Use   Smoking status: Never   Smokeless tobacco: Current    Types: Snuff   Tobacco comments:    3 dips per day  Vaping Use   Vaping Use: Never used  Substance and Sexual Activity   Alcohol use: No   Drug use: No   Sexual activity: Yes    Birth control/protection: None  Other Topics Concern   Not on file  Social History Narrative   Not on file   Social Determinants of Health   Financial Resource Strain: Not on file  Food Insecurity: Not on file  Transportation Needs: Not on file  Physical Activity: Not on file  Stress: Not on file  Social Connections: Not on file   Outpatient Encounter Medications as of 09/09/2020  Medication Sig   ACCU-CHEK FASTCLIX LANCETS MISC 1 each by Does not apply route 2 (two) times daily.   ACCU-CHEK GUIDE test strip USE AS INSTRUCTED TO TEST BLOOD GLUCOSE FOUR TIMES DAILY   acetaminophen (TYLENOL) 325 MG tablet Take 650 mg by mouth 3 (three) times daily.   aspirin EC 81 MG tablet Take 1 tablet (81 mg total) by mouth daily.   atorvastatin (LIPITOR) 20 MG tablet Take 20 mg by mouth daily. (Patient not taking: Reported on 09/09/2020)   Blood Glucose Monitoring Suppl (  ACCU-CHEK GUIDE) w/Device KIT Use as directed to check blood glucose four times daily   carvedilol (COREG) 12.5 MG tablet TAKE 1/2 TABLET BY MOUTH TWICE A DAY WITH A MEAL   empagliflozin (JARDIANCE) 10 MG TABS tablet Take 1 tablet (10 mg total) by mouth daily.   Insulin Lispro Prot & Lispro (HUMALOG MIX 75/25 KWIKPEN) (75-25) 100 UNIT/ML Kwikpen Inject 20 Units into the skin 2 (two) times daily.   Lancets Misc. (ACCU-CHEK FASTCLIX LANCET) KIT 1 each by Does not apply route 2 (two) times daily.   sacubitril-valsartan (ENTRESTO) 24-26 MG Take  1 tablet by mouth 2 (two) times daily.   [DISCONTINUED] HUMALOG MIX 75/25 KWIKPEN (75-25) 100 UNIT/ML Kwikpen INJECT 30-35 UNITS INTO THE SKIN 2 (TWO) TIMES DAILY BEFORE A MEAL. (Patient taking differently: Inject 20 Units into the skin 2 (two) times daily.)   No facility-administered encounter medications on file as of 09/09/2020.   ALLERGIES: No Known Allergies VACCINATION STATUS: Immunization History  Administered Date(s) Administered   Influenza-Unspecified 11/19/2017    Diabetes She is returns for follow-up in the management of her currently uncontrolled type 2 diabetes, hyperlipidemia, hypertension.   Onset time: She was diagnosed with diabetes at approximately 75 years of age.   Her disease course has been fluctuating.  She presents with controlled glycemic profile, averaging between 120 and 154.  Her point-of-care A1c is 6.4%, progressively improving.   She has done relatively better on premixed insulin twice a day.  She is currently on Humalog 75/25 20 units twice daily.  In the interval, she was started on Jardiance 10 mg p.o. daily by her cardiologist. Pertinent negatives for hypoglycemia include no confusion, headaches, pallor or seizures. There are no diabetic associated symptoms. Pertinent negatives for diabetes include no chest pain, no polydipsia, no polyphagia and no polyuria. There are no hypoglycemic complications. Symptoms are worsening. Diabetic complications include nephropathy. Risk factors for coronary artery disease include diabetes mellitus, dyslipidemia, hypertension, sedentary lifestyle and tobacco exposure. Current diabetic treatment includes premixed insulin twice daily associated with monitoring of blood glucose at least 2 times a day.   She did not document any major hypoglycemia. Her weight is stable, she complains of poor appetite. She is following a generally unhealthy diet, regularly eats "junk food".  She has not had a previous visit with a dietitian (She  declined a referral to CDE.)   An ACE inhibitor/angiotensin II receptor blocker is being taken. Eye exam is current.    Hyperlipidemia This is a chronic problem. The current episode started more than 1 year ago. Pertinent negatives include no chest pain, myalgias or shortness of breath. Current antihyperlipidemic treatment includes statins. Risk factors for coronary artery disease include dyslipidemia, diabetes mellitus and a sedentary lifestyle.  Hypertension This is a chronic problem. The current episode started more than 1 year ago. The problem is controlled. Pertinent negatives include no chest pain, headaches, palpitations or shortness of breath. Past treatments include ACE inhibitors. Hypertensive end-organ damage includes kidney disease.    Review of systems: Limited as above.   Objective:    BP 120/62   Pulse 72   Ht 5' 6" (1.676 m)   Wt 203 lb 6.4 oz (92.3 kg)   BMI 32.83 kg/m   Wt Readings from Last 3 Encounters:  09/09/20 203 lb 6.4 oz (92.3 kg)  07/25/20 205 lb (93 kg)  05/06/20 212 lb 3.2 oz (96.3 kg)     Physical Exam- Limited  Constitutional:  Body mass index is 32.83 kg/m. ,  not in acute distress, normal state of mind Eyes:  EOMI, no exophthalmos    CMP Latest Ref Rng & Units 08/08/2020 04/29/2020 04/03/2020  Glucose 65 - 99 mg/dL 147(H) 89 369(H)  BUN 8 - 27 mg/dL _0 Creatinine 0.57 - 1.00 mg/dL 1.09(H) 0.92 0.91  Sodium 134 - 144 mmol/L 147(H) 146(H) 140  Potassium 3.5 - 5.2 mmol/L 4.5 4.1 4.4  Chloride 96 - 106 mmol/L 109(H) 107(H) 101  CO2 20 - 29 mmol/L 35(H) 24 24  Calcium 8.7 - 10.3 mg/dL 9.6 9.5 9.2  Total Protein 6.0 - 8.5 g/dL 6.7 6.8 -  Total Bilirubin 0.0 - 1.2 mg/dL 0.2 0.5 -  Alkaline Phos 44 - 121 IU/L 73 77 -  AST 0 - 40 IU/L 13 13 -  ALT 0 - 32 IU/L 6 8 -    Lab Results  Component Value Date   HGBA1C 6.4 09/09/2020   HGBA1C 7.1 (A) 05/06/2020   HGBA1C 7.3 01/04/2020   Lipid Panel     Component Value Date/Time   CHOL 219  (H) 08/28/2019 0851   TRIG 65 08/28/2019 0851   HDL 62 08/28/2019 0851   CHOLHDL 3.5 08/28/2019 0851   VLDL 14 09/26/2015 0856   LDLCALC 141 (H) 08/28/2019 0851     Assessment & Plan:   1. Type 2 diabetes mellitus with long-term current use of insulin (HCC)  -Her diabetes is complicated by cardiomyopathy and she remains at a high risk for more acute and chronic complications of diabetes which include CAD, CVA, CKD, retinopathy, and neuropathy. These are all discussed in detail with the patient.  Patient presents with continued improvement in her glycemic profile, point-of-care A1c of 6.4%-overall improving.  No hypoglycemia reported or documented.     Recent labs reviewed.   - I have re-counseled the patient on diet management and weight loss  by adopting a carbohydrate restricted / protein rich  Diet.  - she acknowledges that there is a room for improvement in her food and drink choices. - Suggestion is made for her to avoid simple carbohydrates  from her diet including Cakes, Sweet Desserts, Ice Cream, Soda (diet and regular), Sweet Tea, Candies, Chips, Cookies, Store Bought Juices, Alcohol in Excess of  1-2 drinks a day, Artificial Sweeteners,  Coffee Creamer, and "Sugar-free" Products, Lemonade. This will help patient to have more stable blood glucose profile and potentially avoid unintended weight gain.   - Patient is advised to stick to a routine mealtimes to eat 3 meals  a day and avoid unnecessary snacks ( to snack only to correct hypoglycemia).  - The patient  declined referral to a  CDE for individualized DM education.  - I have approached patient with the following individualized plan to manage diabetes and patient agrees.   - Patient  worries a lot about hypoglycemia. -She has done very well on premixed insulin.  She is advised to continue Humalog 75/25 20 units at breakfast and 20 units at supper for Premeal blood glucose readings above 90 mg per DL.  In the interval,  she was started on Jardiance 10 mg by her cardiologist, advised to continue.   -She is advised and agrees to continue monitoring of blood glucose 4 times a day-before meals and at bedtime.    -Patient is encouraged to call clinic for blood glucose levels less than 70 or above 200 mg /dl.  -Her labs show her renal function is improving. She would have benefited from metformin however  she reports intolerance to metformin.   - Patient specific target  for A1c; LDL, HDL, Triglycerides,were discussed in detail.  2) BP/HTN:  -Her blood pressure is controlled to target.  She is not on Entresto instead of lisinopril.   She is advised to continue her current blood pressure medications .   3) Lipids/HPL: Recent lipid panel showed controlled LDL at 64.  She is advised to continue atorvastatin 10 mg p.o. nightly.  Side effects and precautions discussed with her.     4)  Weight/Diet: Her BMI 32.8--a candidate for modest weight loss.  She declined CDE consult . exercise, and carbohydrates information provided.  5) Chronic Care/Health Maintenance:  -Patient is on ACEI/ARB and Statin medications and encouraged to continue to follow up with Ophthalmology, Podiatrist at least yearly or according to recommendations, and advised to  stay away from smoking. I have recommended yearly flu vaccine and pneumonia vaccination at least every 5 years; moderate intensity exercise for up to 150 minutes weekly; and  sleep for at least 7 hours a day.   - I advised patient to maintain close follow up with Jake Samples, PA-C for primary care needs.   I spent 34 minutes in the care of the patient today including review of labs from [facility], Lipids, Thyroid Function, Hematology (current and previous including abstractions from other facilities); face-to-face time discussing  her blood glucose readings/logs, discussing hypoglycemia and hyperglycemia episodes and symptoms, medications doses, her options of short and long  term treatment based on the latest standards of care / guidelines;  discussion about incorporating lifestyle medicine;  and documenting the encounter.    Please refer to Patient Instructions for Blood Glucose Monitoring and Insulin/Medications Dosing Guide"  in media tab for additional information. Please  also refer to " Patient Self Inventory" in the Media  tab for reviewed elements of pertinent patient history.  Diana Campbell participated in the discussions, expressed understanding, and voiced agreement with the above plans.  All questions were answered to her satisfaction. she is encouraged to contact clinic should she have any questions or concerns prior to her return visit.   Follow up plan: -Return in about 4 months (around 01/09/2021) for Bring Meter and Logs- A1c in Office.  Glade Lloyd, MD Phone: [phone]  Fax: [phone]  -  This note was partially dictated with voice recognition software. Similar sounding words can be transcribed inadequately or may not  be corrected upon review.  09/09/2020, 9:02 AM

## 2020-09-09 NOTE — Patient Instructions (Signed)

## 2020-09-24 ENCOUNTER — Ambulatory Visit (INDEPENDENT_AMBULATORY_CARE_PROVIDER_SITE_OTHER): Payer: Medicare PPO

## 2020-09-24 ENCOUNTER — Other Ambulatory Visit: Payer: Self-pay | Admitting: Cardiovascular Disease

## 2020-09-24 DIAGNOSIS — Z95810 Presence of automatic (implantable) cardiac defibrillator: Secondary | ICD-10-CM | POA: Diagnosis not present

## 2020-09-24 DIAGNOSIS — I5042 Chronic combined systolic (congestive) and diastolic (congestive) heart failure: Secondary | ICD-10-CM | POA: Diagnosis not present

## 2020-09-25 ENCOUNTER — Ambulatory Visit: Payer: Medicare PPO

## 2020-09-25 ENCOUNTER — Telehealth: Payer: Self-pay

## 2020-09-25 NOTE — Telephone Encounter (Signed)
LMOM TO R/S MISSED APPT  

## 2020-09-25 NOTE — Progress Notes (Deleted)
     09/25/2020 Diana Campbell [birth year] [id number]   HPI:  Diana Campbell is a 75 y.o. female patient of Dr Claiborne Billings, with a PMH below who presents today for heart failure medication titration.  She was previously on Entresto 49/51.  At a previous visit in March with Raquel Rodriguez-Guzman, she believed that the [facility] was causing an increase in her blood sugars, although she was still eating poorly.  At some point after that visit she stopped the medication and it was re-started by Dr. Claiborne Billings  Insurance is [facility] [employer]  Past Medical History: hyperlipidemia   hypertension   AF   DM2         Blood Pressure Goal:  130/80  Current Medications:  carvedilol 6.25 mg bid, Entresto 24/26 mg bid, empagliflozin 10 mg qd  Family Hx:  Social Hx:   Diet:   Exercise:   Home BP readings:   Intolerances: nkda  Labs:    Wt Readings from Last 3 Encounters:  09/09/20 203 lb 6.4 oz (92.3 kg)  07/25/20 205 lb (93 kg)  05/06/20 212 lb 3.2 oz (96.3 kg)   BP Readings from Last 3 Encounters:  09/09/20 120/62  07/25/20 138/60  05/06/20 140/62   Pulse Readings from Last 3 Encounters:  09/09/20 72  07/25/20 62  05/06/20 60    Current Outpatient Medications  Medication Sig Dispense Refill   ACCU-CHEK FASTCLIX LANCETS MISC 1 each by Does not apply route 2 (two) times daily. 200 each 5   ACCU-CHEK GUIDE test strip USE AS INSTRUCTED TO TEST BLOOD GLUCOSE FOUR TIMES DAILY 400 strip 1   acetaminophen (TYLENOL) 325 MG tablet Take 650 mg by mouth 3 (three) times daily.     aspirin EC 81 MG tablet Take 1 tablet (81 mg total) by mouth daily. 90 tablet 3   atorvastatin (LIPITOR) 20 MG tablet Take 20 mg by mouth daily. (Patient not taking: Reported on 09/09/2020)     Blood Glucose Monitoring Suppl (ACCU-CHEK GUIDE) w/Device KIT Use as directed to check blood glucose four times daily 1 kit 0   carvedilol (COREG) 12.5 MG tablet TAKE 1/2 TABLET BY MOUTH TWICE A DAY WITH A MEAL  90 tablet 2   empagliflozin (JARDIANCE) 10 MG TABS tablet Take 1 tablet (10 mg total) by mouth daily. 90 tablet 3   ENTRESTO 24-26 MG TAKE 1 TABLET BY MOUTH TWICE A DAY 60 tablet 1   Insulin Lispro Prot & Lispro (HUMALOG MIX 75/25 KWIKPEN) (75-25) 100 UNIT/ML Kwikpen Inject 20 Units into the skin 2 (two) times daily. 15 mL 1   Lancets Misc. (ACCU-CHEK FASTCLIX LANCET) KIT 1 each by Does not apply route 2 (two) times daily. 1 kit 2   No current facility-administered medications for this visit.    No Known Allergies  Past Medical History:  Diagnosis Date   Arthritis    Automatic implantable cardioverter-defibrillator in situ 07/2010   Cardiac abnormality    with defibrilator   Chronic combined systolic and diastolic CHF (congestive heart failure) ([place]) 09/04/2015   Diabetes mellitus    H/O cardiac arrest 2006   Hypercholesteremia    Hypertension    Neuropathy     There were no vitals taken for this visit.  No problem-specific Assessment & Plan notes found for this encounter.   Tommy Medal PharmD CPP [facility] [address] [place], [place] [address] [phone]

## 2020-09-25 NOTE — Progress Notes (Signed)
EPIC Encounter for ICM Monitoring  Patient Name: Diana Campbell is a 75 y.o. female Date: 09/25/2020 Primary Care Physican: Ladon Applebaum Primary Cardiologist: Tresa Endo Electrophysiologist: Croitoru 07/25/2020 Office Weight: 205 lbs                                                            Attempted call to patient and unable to reach.  Transmission reviewed.    Optivol thoracic impedance suggesting normal fluid levels.    Prescribed: No diuretic (Previously on Furosemide 40 mg 1 tablet as needed)   Labs: 08/08/2020 Creatinine 1.09, BUN 14, Potassium 4.5, Sodium 14777, GFR 53 04/29/2020 Creatinine 0.92, BUN 14, Potassium 4.1, Sodium 146, GFR 65  04/03/2020 Creatinine 0.91, BUN 14, Potassium 4.4, Sodium 140, GFR 65  A complete set of results can be found in Results Review.   Recommendations:  Unable to reach.     Follow-up plan: ICM clinic phone appointment on 10/31/2020.   91 day device clinic remote transmission 10/30/2020.     EP/Cardiology Office Visits:  Recall 03/04/2021 with Dr Royann Shivers.  12/02/2020 with Dr Tresa Endo.     Copy of ICM check sent to Dr. Royann Shivers.   3 month ICM trend: 09/24/2020.    1 Year ICM trend:       [name], RN 09/25/2020 12:10 PM

## 2020-09-25 NOTE — Telephone Encounter (Signed)
Remote ICM transmission received.  Attempted call to patient regarding ICM remote transmission and left detailed message per DPR.  Advised to return call for any fluid symptoms or questions. Next ICM remote transmission scheduled 10/31/2020.

## 2020-10-06 ENCOUNTER — Other Ambulatory Visit: Payer: Self-pay | Admitting: "Endocrinology

## 2020-10-28 DIAGNOSIS — I4891 Unspecified atrial fibrillation: Secondary | ICD-10-CM | POA: Diagnosis not present

## 2020-10-28 DIAGNOSIS — D6869 Other thrombophilia: Secondary | ICD-10-CM | POA: Diagnosis not present

## 2020-10-28 DIAGNOSIS — E1142 Type 2 diabetes mellitus with diabetic polyneuropathy: Secondary | ICD-10-CM | POA: Diagnosis not present

## 2020-10-28 DIAGNOSIS — I110 Hypertensive heart disease with heart failure: Secondary | ICD-10-CM | POA: Diagnosis not present

## 2020-10-28 DIAGNOSIS — I429 Cardiomyopathy, unspecified: Secondary | ICD-10-CM | POA: Diagnosis not present

## 2020-10-28 DIAGNOSIS — I509 Heart failure, unspecified: Secondary | ICD-10-CM | POA: Diagnosis not present

## 2020-10-28 DIAGNOSIS — E1165 Type 2 diabetes mellitus with hyperglycemia: Secondary | ICD-10-CM | POA: Diagnosis not present

## 2020-10-28 DIAGNOSIS — Z794 Long term (current) use of insulin: Secondary | ICD-10-CM | POA: Diagnosis not present

## 2020-10-28 DIAGNOSIS — E261 Secondary hyperaldosteronism: Secondary | ICD-10-CM | POA: Diagnosis not present

## 2020-10-30 ENCOUNTER — Ambulatory Visit (INDEPENDENT_AMBULATORY_CARE_PROVIDER_SITE_OTHER): Payer: Medicare PPO

## 2020-10-30 DIAGNOSIS — I428 Other cardiomyopathies: Secondary | ICD-10-CM

## 2020-10-31 LAB — CUP PACEART REMOTE DEVICE CHECK
Battery Remaining Longevity: 85 mo
Battery Voltage: 2.99 V
Brady Statistic AP VP Percent: 0.04 %
Brady Statistic AP VS Percent: 74.46 %
Brady Statistic AS VP Percent: 0 %
Brady Statistic AS VS Percent: 25.5 %
Brady Statistic RA Percent Paced: 74.16 %
Brady Statistic RV Percent Paced: 0.04 %
Date Time Interrogation Session: 20221012022724
HighPow Impedance: 47 Ohm
HighPow Impedance: 57 Ohm
Implantable Lead Implant Date: 20120702
Implantable Lead Implant Date: 20120702
Implantable Lead Location: 753859
Implantable Lead Location: 753860
Implantable Lead Model: 5076
Implantable Lead Model: 6947
Implantable Pulse Generator Implant Date: 20191011
Lead Channel Impedance Value: 342 Ohm
Lead Channel Impedance Value: 456 Ohm
Lead Channel Impedance Value: 494 Ohm
Lead Channel Pacing Threshold Amplitude: 0.75 V
Lead Channel Pacing Threshold Amplitude: 1.125 V
Lead Channel Pacing Threshold Pulse Width: 0.4 ms
Lead Channel Pacing Threshold Pulse Width: 0.4 ms
Lead Channel Sensing Intrinsic Amplitude: 11.375 mV
Lead Channel Sensing Intrinsic Amplitude: 11.375 mV
Lead Channel Sensing Intrinsic Amplitude: 2.5 mV
Lead Channel Sensing Intrinsic Amplitude: 2.5 mV
Lead Channel Setting Pacing Amplitude: 1.5 V
Lead Channel Setting Pacing Amplitude: 2.25 V
Lead Channel Setting Pacing Pulse Width: 0.4 ms
Lead Channel Setting Sensing Sensitivity: 0.3 mV

## 2020-11-01 ENCOUNTER — Ambulatory Visit (INDEPENDENT_AMBULATORY_CARE_PROVIDER_SITE_OTHER): Payer: Medicare PPO

## 2020-11-01 DIAGNOSIS — I5042 Chronic combined systolic (congestive) and diastolic (congestive) heart failure: Secondary | ICD-10-CM | POA: Diagnosis not present

## 2020-11-01 DIAGNOSIS — Z95810 Presence of automatic (implantable) cardiac defibrillator: Secondary | ICD-10-CM

## 2020-11-01 NOTE — Progress Notes (Signed)
EPIC Encounter for ICM Monitoring  Patient Name: Diana Campbell is a 75 y.o. female Date: 11/01/2020 Primary Care Physican: Ladon Applebaum Primary Cardiologist: Tresa Endo Electrophysiologist: Croitoru 07/25/2020 Office Weight: 205 lbs                                                            Transmission reviewed.    Optivol thoracic impedance suggesting normal fluid levels.    Prescribed: No diuretic (Previously on Furosemide 40 mg 1 tablet as needed)   Labs: 08/08/2020 Creatinine 1.09, BUN 14, Potassium 4.5, Sodium 14777, GFR 53 04/29/2020 Creatinine 0.92, BUN 14, Potassium 4.1, Sodium 146, GFR 65  04/03/2020 Creatinine 0.91, BUN 14, Potassium 4.4, Sodium 140, GFR 65  A complete set of results can be found in Results Review.   Recommendations:  No changes.     Follow-up plan: ICM clinic phone appointment on 12/02/2020.   91 day device clinic remote transmission 01/29/2021.     EP/Cardiology Office Visits:  Recall 03/04/2021 with Dr Royann Shivers.  12/02/2020 with Dr Tresa Endo.     Copy of ICM check sent to Dr. Royann Shivers.   3 month ICM trend: 10/30/2020.    1 Year ICM trend:       [name], RN 11/01/2020 4:21 PM

## 2020-11-07 ENCOUNTER — Other Ambulatory Visit: Payer: Self-pay | Admitting: Cardiovascular Disease

## 2020-11-07 NOTE — Progress Notes (Signed)
Remote ICD transmission.   

## 2020-12-02 ENCOUNTER — Ambulatory Visit: Payer: Medicare PPO | Admitting: Cardiovascular Disease

## 2020-12-02 ENCOUNTER — Encounter: Payer: Self-pay | Admitting: Cardiovascular Disease

## 2020-12-02 ENCOUNTER — Other Ambulatory Visit: Payer: Self-pay

## 2020-12-02 ENCOUNTER — Ambulatory Visit (INDEPENDENT_AMBULATORY_CARE_PROVIDER_SITE_OTHER): Payer: Medicare PPO

## 2020-12-02 VITALS — BP 140/66 | HR 62 | Ht 66.0 in | Wt 209.2 lb

## 2020-12-02 DIAGNOSIS — I5042 Chronic combined systolic (congestive) and diastolic (congestive) heart failure: Secondary | ICD-10-CM

## 2020-12-02 DIAGNOSIS — Z95810 Presence of automatic (implantable) cardiac defibrillator: Secondary | ICD-10-CM

## 2020-12-02 DIAGNOSIS — I10 Essential (primary) hypertension: Secondary | ICD-10-CM | POA: Diagnosis not present

## 2020-12-02 DIAGNOSIS — E782 Mixed hyperlipidemia: Secondary | ICD-10-CM

## 2020-12-02 DIAGNOSIS — E669 Obesity, unspecified: Secondary | ICD-10-CM | POA: Diagnosis not present

## 2020-12-02 DIAGNOSIS — Z794 Long term (current) use of insulin: Secondary | ICD-10-CM | POA: Diagnosis not present

## 2020-12-02 DIAGNOSIS — I519 Heart disease, unspecified: Secondary | ICD-10-CM

## 2020-12-02 DIAGNOSIS — I428 Other cardiomyopathies: Secondary | ICD-10-CM | POA: Diagnosis not present

## 2020-12-02 DIAGNOSIS — E118 Type 2 diabetes mellitus with unspecified complications: Secondary | ICD-10-CM | POA: Diagnosis not present

## 2020-12-02 MED ORDER — SACUBITRIL-VALSARTAN 49-51 MG PO TABS: 1.0000 | ORAL_TABLET | Freq: Two times a day (BID) | ORAL | Status: DC

## 2020-12-02 NOTE — Progress Notes (Signed)
Patient ID: Diana Campbell, female   DOB: 1945/10/11, 75 y.o.   MRN: [id number]     HPI: Diana Campbell is a 75 y.o. female who presents to the office for a 5 month  follow-up cardiology evaluation.       Diana Campbell suffered a ventricular fibrillation cardiac arrest in June 2005 at which time she was successfully resuscitated. She was found to have a nonischemic cardiomyopathy;  initial ejection fraction 15%. Catheterization revealed 30-40% intermediate stenosis. At that time, she underwent single-chamber Medtronic ICD implantation and initially had a spring fidelis lead. In July 2012 she underwent complete revision of her system.  An echo Doppler study in July 2013 showed an ejection fraction now at 45-50% with mild global hypokinesis. She did have mild MR, mild TR, and mild aortic sclerosis.  Additional problems include type 2 diabetes mellitus on insulin, hypertension, hyperlipidemia, and probable untreated obstructive sleep apnea.  In the past she has had some mild edema.  She has been followed by Dr. Sallyanne Kuster for her defibrillator.  In September 2018  ICD interrogation showed normal device function, normal lead parameters, no episodes of ventricular tachycardia and she was atrially pacing 86% of the time.  There was no ventricular pacing.  She had a favorable heart rate histogram and thoracic impedance was fairly stable but at times she had some evidence for fluid overload.  When I last saw her in March 2019, she denied any episodes of chest pain.  She was experiencing mild shortness of breath particularly when she bends over and also admitted to some mild ankle swelling.  She has chronic low back pain and for this reason she does not exercise.  Since it had been 6 years since her last echo Doppler study I recommended she undergo a follow-up echocardiographic evaluation.  He echo Doppler study showed an EF of 40 to 45% with mild LV dilation and diffuse hypokinesis.  There is no significant  valvular abnormalities.  She underwent elective generator change out for her ICD in October 2019.  She has an abandoned lead and unfortunately cannot have MRIs even though she has an MRI conditional system.  She was hospitalized January 2020 with sepsis secondary to UTI.  She has continued to be followed by Dr. Sallyanne Kuster for the next.  She was evaluated by Kerin Ransom in 2020, [place], [place] she has continued to see Dr. Sallyanne Kuster.  She has been transitioned to [facility] and has been treated with carvedilol and loop diuretic.  She was seen by Harrington Challenger, RPH-CPP most recently in March 2022.  At that time, the patient wanted to be off Entresto but further discussion was made and the patient was willing to continue [facility] for another month.  She felt the [name] was causing her blood pressure to go up but apparently her diet was poor and she was eating cookies and soda for breakfast and continue to eat hotdogs and food high in sodium most likely contributing to her blood pressure elevation.  I last saw her in July 2022 after not having seen her since March 2019.  That time she had stopped her Entresto.  She denied she subsequently stopped her Entresto.  She denied chest pain ,PND or orthopnea.  When last seen by Dr. Sallyanne Kuster there was some discussion about possible SGLT2 inhibition.  During 1, I recommended a retrial of Entresto 24/26 mg twice a day.  I also recommended initiation of Jardiance 10 mg.    Presently, she feels well.  She has tolerated Entresto.  He is followed at [place] for her primary care.  At times she does note bilateral leg discomfort.  She has been told of having a nerve in her low back.  Is not had recent evaluation.  He denies chest pain.  She denies dizziness or palpitations.  She presents for reevaluation.   Past Medical History:  Diagnosis Date   Arthritis    Automatic implantable cardioverter-defibrillator in situ 07/2010   Cardiac abnormality    with  defibrilator   Chronic combined systolic and diastolic CHF (congestive heart failure) ([place]) 09/04/2015   Diabetes mellitus    H/O cardiac arrest 2006   Hypercholesteremia    Hypertension    Neuropathy     Past Surgical History:  Procedure Laterality Date   CARDIAC CATHETERIZATION  06/22/2003   Severe nonischemic cardiomyopathy, 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   CARDIAC CATHETERIZATION  04/14/2000   Normal coronary arteries.   CARDIAC DEFIBRILLATOR PLACEMENT  07/21/2010   Medtronic Protecta XT DR, model#[id number], serial#[id number]   CARDIOVASCULAR STRESS TEST  04/05/2000   Scintigraphic evidence of mild LV dilatation and global LV dysfunction with an EF 41% with mild inferolateral ischemia.   CARPAL TUNNEL RELEASE Right 01/23/2016   Procedure: CARPAL TUNNEL RELEASE;  Surgeon: Carole Civil, MD;  Location: [facility];  Service: Orthopedics;  Laterality: Right;  pt knows to arrive at 6:15   COLONOSCOPY  03/30/2011   Procedure: COLONOSCOPY;  Surgeon: Jamesetta So, MD;  Location: [facility];  Service: Gastroenterology;  Laterality: N/A;   DORSAL COMPARTMENT RELEASE Right 08/11/2013   Procedure: RELEASE DORSAL COMPARTMENT (DEQUERVAIN);  Surgeon: Carole Civil, MD;  Location: [facility];  Service: Orthopedics;  Laterality: Right;   ICD GENERATOR CHANGEOUT N/A 10/29/2017   Procedure: ICD GENERATOR CHANGEOUT;  Surgeon: Sanda Klein, MD;  Location: [place] [facility];  Service: Cardiovascular;  Laterality: N/A;   TRANSTHORACIC ECHOCARDIOGRAM  08/03/2011   EF 45-50%, mildly reduced LV systolic function.    No Known Allergies  Current Outpatient Medications  Medication Sig Dispense Refill   ACCU-CHEK FASTCLIX LANCETS MISC 1 each by Does not apply route 2 (two) times daily. 200 each 5   ACCU-CHEK GUIDE test strip USE AS INSTRUCTED TO TEST BLOOD GLUCOSE FOUR TIMES DAILY 400 strip 1   acetaminophen  (TYLENOL) 325 MG tablet Take 650 mg by mouth in the morning and at bedtime.     aspirin EC 81 MG tablet Take 1 tablet (81 mg total) by mouth daily. 90 tablet 3   Blood Glucose Monitoring Suppl (ACCU-CHEK GUIDE) w/Device KIT Use as directed to check blood glucose four times daily 1 kit 0   carvedilol (COREG) 12.5 MG tablet TAKE 1/2 TABLET BY MOUTH TWICE A DAY WITH A MEAL 90 tablet 2   empagliflozin (JARDIANCE) 10 MG TABS tablet Take 1 tablet (10 mg total) by mouth daily. 90 tablet 3   Insulin Lispro Prot & Lispro (HUMALOG MIX 75/25 KWIKPEN) (75-25) 100 UNIT/ML Kwikpen Inject 20 Units into the skin 2 (two) times daily. 15 mL 1   Lancets Misc. (ACCU-CHEK FASTCLIX LANCET) KIT 1 each by Does not apply route 2 (two) times daily. 1 kit 2   atorvastatin (LIPITOR) 20 MG tablet Take 20 mg by mouth daily. (Patient not taking: No sig reported)     No current facility-administered medications for this visit.    Social History   Socioeconomic History  Marital status: Married    Spouse name: Not on file   Number of children: Not on file   Years of education: Not on file   Highest education level: Not on file  Occupational History   Not on file  Tobacco Use   Smoking status: Never   Smokeless tobacco: Current    Types: Snuff   Tobacco comments:    3 dips per day  Vaping Use   Vaping Use: Never used  Substance and Sexual Activity   Alcohol use: No   Drug use: No   Sexual activity: Yes    Birth control/protection: None  Other Topics Concern   Not on file  Social History Narrative   Not on file   Social Determinants of Health   Financial Resource Strain: Not on file  Food Insecurity: Not on file  Transportation Needs: Not on file  Physical Activity: Not on file  Stress: Not on file  Social Connections: Not on file  Intimate Partner Violence: Not on file   Socially she is married and has 4 children. She has a new grand child.  Parents are deceased  ROS General: Negative; No  fevers, chills, or night sweats; positive for fatigue HEENT: Negative; No changes in vision or hearing, sinus congestion, difficulty swallowing Pulmonary: Negative; No cough, wheezing, shortness of breath, hemoptysis Cardiovascular: see HPI GI: Negative; No nausea, vomiting, diarrhea, or abdominal pain GU: Negative; No dysuria, hematuria, or difficulty voiding Musculoskeletal: Low back discomfort Hematologic/Oncology: Negative; no easy bruising, bleeding Endocrine: Positive for diabetes mellitus Neuro: Negative; no changes in balance, headaches Skin: Negative; No rashes or skin lesions Psychiatric: Negative; No behavioral problems, depression Sleep: Probable untreated sleep apnea with history of snoring, fatigue, frequent awakenings.  She did not undergo her recommended sleep study. Other comprehensive 14 point system review is negative.   PE BP 140/66   Pulse 62   Ht 5' 6"  (1.676 m)   Wt 209 lb 3.2 oz (94.9 kg)   SpO2 97%   BMI 33.77 kg/m    Repeat blood pressure by me was 142/68  Wt Readings from Last 3 Encounters:  12/02/20 209 lb 3.2 oz (94.9 kg)  09/09/20 203 lb 6.4 oz (92.3 kg)  07/25/20 205 lb (93 kg)   General: Alert, oriented, no distress.  Skin: normal turgor, no rashes, warm and dry HEENT: Normocephalic, atraumatic. Pupils equal round and reactive to light; sclera anicteric; extraocular muscles intact;  Nose without nasal septal hypertrophy Mouth/Parynx benign; Mallinpatti scale 3 Neck: No JVD, no carotid bruits; normal carotid upstroke Lungs: clear to ausculatation and percussion; no wheezing or rales Chest wall: without tenderness to palpitation Heart: PMI not displaced, RRR, s1 s2 normal, 1/6 systolic murmur, no diastolic murmur, no rubs, gallops, thrills, or heaves Abdomen: soft, nontender; no hepatosplenomehaly, BS+; abdominal aorta nontender and not dilated by palpation. Back: no CVA tenderness Pulses 2+ Musculoskeletal: full range of motion, normal  strength, no joint deformities Extremities: no clubbing cyanosis or edema, Homan's sign negative  Neurologic: grossly nonfocal; Cranial nerves grossly wnl Psychologic: Normal mood and affect   ECG (independently read by me): Atrial paced at 62; prolonged AV conduction PR 212 msec   July 25, 2020 ECG (independently read by me): Atrial paced at 62  March 2019 ECG (independently read by me): Telemetry paced rhythm at 63 bpm.  PR interval 206 ms.  March 2017 ECG (independently read by me): Atrially paced rhythm at 61 bpm.  December 2014 ECG: Atrial lead paced rhythm at  79 beats per minute. PRWP.  Isolated PVC.  LABS:  BMP Latest Ref Rng & Units 08/08/2020 04/29/2020 04/03/2020  Glucose 65 - 99 mg/dL 147(H) 89 369(H)  BUN 8 - 27 mg/dL 14 14 14   Creatinine 0.57 - 1.00 mg/dL 1.09(H) 0.92 0.91  BUN/Creat Ratio 12 - 28 13 15 15   Sodium 134 - 144 mmol/L 147(H) 146(H) 140  Potassium 3.5 - 5.2 mmol/L 4.5 4.1 4.4  Chloride 96 - 106 mmol/L 109(H) 107(H) 101  CO2 20 - 29 mmol/L 35(H) 24 24  Calcium 8.7 - 10.3 mg/dL 9.6 9.5 9.2   Hepatic Function Latest Ref Rng & Units 08/08/2020 04/29/2020 08/28/2019  Total Protein 6.0 - 8.5 g/dL 6.7 6.8 7.2  Albumin 3.7 - 4.7 g/dL 4.1 4.1 -  AST 0 - 40 IU/L 13 13 11   ALT 0 - 32 IU/L 6 8 9   Alk Phosphatase 44 - 121 IU/L 73 77 -  Total Bilirubin 0.0 - 1.2 mg/dL 0.2 0.5 0.5   CBC Latest Ref Rng & Units 01/31/2018 01/30/2018 01/29/2018  WBC 4.0 - 10.5 K/uL 14.3(H) 16.4(H) 18.1(H)  Hemoglobin 12.0 - 15.0 g/dL 11.5(L) 11.2(L) 10.9(L)  Hematocrit 36.0 - 46.0 % 36.3 36.2 35.6(L)  Platelets 150 - 400 K/uL 310 255 231   Lab Results  Component Value Date   MCV 77.2 (L) 01/31/2018   MCV 77.4 (L) 01/30/2018   MCV 77.6 (L) 01/29/2018   Lab Results  Component Value Date   TSH 1.40 08/28/2019   Lab Results  Component Value Date   HGBA1C 6.4 09/09/2020   Lipid Panel     Component Value Date/Time   CHOL 219 (H) 08/28/2019 0851   TRIG 65 08/28/2019 0851   HDL 62  08/28/2019 0851   CHOLHDL 3.5 08/28/2019 0851   VLDL 14 09/26/2015 0856   LDLCALC 141 (H) 08/28/2019 0851     RADIOLOGY: No results found.  IMPRESSION:  1. Cardiomyopathy, nonischemic ([place])   2. Chronic combined systolic and diastolic CHF (congestive heart failure) ([place])   3. Essential hypertension   4. Automatic implantable cardioverter-defibrillator in situ   5. Mixed hyperlipidemia   6. Type 2 diabetes mellitus with complication, with long-term current use of insulin ([place])   7. Mild obesity     ASSESSMENT AND PLAN: Diana Campbell is a 75 year old African-American female who is status post a ventricular fibrillation cardiac arrest in June 2005 at which time she was found to have an ejection fraction of 15% and felt to have a nonischemic cardiomyopathy. She has a Nutritional therapist XT DR defibrillator in place. An echo Doppler study  in July 2013 showed an ejection fraction of 45-50%.  She had been on lisinopril, as needed hydrochlorothiazide in the past.  Her last echo Doppler study in March 2019 was showed an EF of 40 to 45% with mild LV dilation and diffuse hypokinesis.  She did not have significant valvular abnormalities.  I have not seen her since 2019.  In October 2019 she had a generator change out.  There was an option for future CRT upgrade if clinical status deteriorates.  Apparently, over the past year she was transitioned to [place].  When last seen in March 2022 by Elon Alas.D. she was on Entresto 49/51 mg twice a day, carvedilol 6.25 mg twice a day, furosemide 40 mg daily in addition to atorvastatin 20 mg, Humalog insulin.  When I saw her in July 2022 she had stopped her Entresto.  That time her blood pressure was  stable and I recommended reinstitution at 24/26 mg twice a day.  I also added Jardiance for SGLT2 inhibition.  Her blood pressure today remained stable.  She has tolerated Entresto.  I am now recommended retitration back to 49/51 mg twice a day.  He needs to  be on Jardiance and is tolerating this well.  She is followed by [facility] for her diabetes.  Most recent hemoglobin A1c was 6.1.  She continues to be on insulin.  At times she has low back discomfort and has a pinched nerve leading to some leg discomfort.  He has not had a recent echo Doppler study.  In several months on her increased Entresto I am recommending a follow-up 2D echo Doppler to reassessment of LV function.  To that office visit she will undergo repeat laboratory with a comprehensive metabolic panel, CBC, TSH, and fasting lipid studies.  BMI is consistent with mild PCP.  She will follow up with her primary care.  I will see her in 3 months for reassessment.    Apparently, since that evaluation is planned and has stopped Entresto.  Medications now include carvedilol 6.25 mg twice a day, aspirin 81 mg in addition to atorvastatin and Humalog insulin.  Her blood pressure today is 140/62.  I have recommended resumption of low-dose Entresto and I provided her with samples of 20/26 mg to take twice a day.  I have also providing her with samples of Jardiance 10 mg for SGLT2 inhibition.  I have recommended she see Joslyn Hy, Pharm.D. in 6 to 8 weeks.  Prior to that evaluation she will undergo a comprehensive metabolic panel and proBNP.  I will see her in 4 months for follow-up evaluation or sooner as needed.  Troy Sine, MD, [facility]  12/02/2020 8:56 AM

## 2020-12-02 NOTE — Patient Instructions (Signed)
Medication Instructions:  Stop taking entresto 24.26. Start taking entresto 49/51 twice a day. *If you need a refill on your cardiac medications before your next appointment, please call your pharmacy*   Lab Work: In 3 months (before next appointment) get fasting blood work (cmet, cbc, tsh, lipids) If you have labs (blood work) drawn today and your tests are completely normal, you will receive your results only by: MyChart Message (if you have MyChart) OR A paper copy in the mail If you have any lab test that is abnormal or we need to change your treatment, we will call you to review the results.   Testing/Procedures: in 3 months Your physician has requested that you have an echocardiogram. Echocardiography is a painless test that uses sound waves to create images of your heart. It provides your doctor with information about the size and shape of your heart and how well your heart's chambers and valves are working. This procedure takes approximately one hour. There are no restrictions for this procedure.     Follow-Up: At [facility], you and your health needs are our priority.  As part of our continuing mission to provide you with exceptional heart care, we have created designated Provider Care Teams.  These Care Teams include your primary Cardiologist (physician) and Advanced Practice Providers (APPs -  Physician Assistants and Nurse Practitioners) who all work together to provide you with the care you need, when you need it.  We recommend signing up for the patient portal called "MyChart".  Sign up information is provided on this After Visit Summary.  MyChart is used to connect with patients for Virtual Visits (Telemedicine).  Patients are able to view lab/test results, encounter notes, upcoming appointments, etc.  Non-urgent messages can be sent to your provider as well.   To learn more about what you can do with MyChart, go to [email or web address].    Your next appointment:   3  month(s)  The format for your next appointment:   In Person  Provider:   Nicki Guadalajara, MD

## 2020-12-04 NOTE — Progress Notes (Signed)
EPIC Encounter for ICM Monitoring  Patient Name: Diana Campbell is a 75 y.o. female Date: 12/04/2020 Primary Care Physican: Ladon Applebaum Primary Cardiologist: Tresa Endo Electrophysiologist: Croitoru 12/02/2020 Office Weight: 209 lbs                                                            Spoke with patient and heart failure questions reviewed.  Pt asymptomatic for fluid accumulation. She has some swelling in right leg but no swelling in left leg.  Office visit completed 11/14 with Dr Tresa Endo.   Optivol thoracic impedance suggesting normal fluid levels.    Prescribed: No diuretic (Previously on Furosemide 40 mg 1 tablet as needed)   Labs: 08/08/2020 Creatinine 1.09, BUN 14, Potassium 4.5, Sodium 14777, GFR 53 04/29/2020 Creatinine 0.92, BUN 14, Potassium 4.1, Sodium 146, GFR 65  04/03/2020 Creatinine 0.91, BUN 14, Potassium 4.4, Sodium 140, GFR 65  A complete set of results can be found in Results Review.   Recommendations:  No changes and encouraged to call if experiencing any fluid symptoms.   Follow-up plan: ICM clinic phone appointment on 01/06/2021.   91 day device clinic remote transmission 01/29/2021.     EP/Cardiology Office Visits:  Recall 03/04/2021 with Dr Royann Shivers.  12/02/2020 with Dr Tresa Endo.     Copy of ICM check sent to Dr. Royann Shivers.   3 month ICM trend: 12/02/2020.    12-14 Month ICM trend:       [name], RN 12/04/2020 1:56 PM

## 2020-12-16 DIAGNOSIS — E782 Mixed hyperlipidemia: Secondary | ICD-10-CM | POA: Diagnosis not present

## 2020-12-17 LAB — COMPREHENSIVE METABOLIC PANEL
ALT: 7 IU/L (ref 0–32)
AST: 11 IU/L (ref 0–40)
Albumin/Globulin Ratio: 1.5 (ref 1.2–2.2)
Albumin: 4 g/dL (ref 3.7–4.7)
Alkaline Phosphatase: 81 IU/L (ref 44–121)
BUN/Creatinine Ratio: 17 (ref 12–28)
BUN: 16 mg/dL (ref 8–27)
Bilirubin Total: 0.3 mg/dL (ref 0.0–1.2)
CO2: 27 mmol/L (ref 20–29)
Calcium: 9.5 mg/dL (ref 8.7–10.3)
Chloride: 106 mmol/L (ref 96–106)
Creatinine, Ser: 0.93 mg/dL (ref 0.57–1.00)
Globulin, Total: 2.7 g/dL (ref 1.5–4.5)
Glucose: 169 mg/dL — ABNORMAL HIGH (ref 70–99)
Potassium: 4.1 mmol/L (ref 3.5–5.2)
Sodium: 143 mmol/L (ref 134–144)
Total Protein: 6.7 g/dL (ref 6.0–8.5)
eGFR: 63 mL/min/{1.73_m2} (ref >59)

## 2020-12-17 LAB — CBC
Hematocrit: 41.8 % (ref 34.0–46.6)
Hemoglobin: 13.4 g/dL (ref 11.1–15.9)
MCH: 25.5 pg — ABNORMAL LOW (ref 26.6–33.0)
MCHC: 32.1 g/dL (ref 31.5–35.7)
MCV: 80 fL (ref 79–97)
Platelets: 286 10*3/uL (ref 150–450)
RBC: 5.25 x10E6/uL (ref 3.77–5.28)
RDW: 14.2 % (ref 11.7–15.4)
WBC: 8.4 10*3/uL (ref 3.4–10.8)

## 2020-12-17 LAB — LIPID PANEL
Chol/HDL Ratio: 3.4 ratio (ref 0.0–4.4)
Cholesterol, Total: 203 mg/dL — ABNORMAL HIGH (ref 100–199)
HDL: 59 mg/dL (ref >39)
LDL Chol Calc (NIH): 127 mg/dL — ABNORMAL HIGH (ref 0–99)
Triglycerides: 93 mg/dL (ref 0–149)
VLDL Cholesterol Cal: 17 mg/dL (ref 5–40)

## 2020-12-17 LAB — TSH: TSH: 0.933 u[IU]/mL (ref 0.450–4.500)

## 2020-12-18 DIAGNOSIS — Z6832 Body mass index (BMI) 32.0-32.9, adult: Secondary | ICD-10-CM | POA: Diagnosis not present

## 2020-12-18 DIAGNOSIS — E6609 Other obesity due to excess calories: Secondary | ICD-10-CM | POA: Diagnosis not present

## 2020-12-18 DIAGNOSIS — G894 Chronic pain syndrome: Secondary | ICD-10-CM | POA: Diagnosis not present

## 2020-12-18 DIAGNOSIS — M1991 Primary osteoarthritis, unspecified site: Secondary | ICD-10-CM | POA: Diagnosis not present

## 2020-12-24 ENCOUNTER — Telehealth: Payer: Self-pay | Admitting: Cardiovascular Disease

## 2020-12-24 MED ORDER — ATORVASTATIN CALCIUM 10 MG PO TABS: 10.0000 mg | ORAL_TABLET | Freq: Every day | ORAL | 3 refills | Status: DC

## 2020-12-24 NOTE — Telephone Encounter (Signed)
Patient returned call for lab results.  

## 2020-12-24 NOTE — Telephone Encounter (Signed)
Patient made aware of results and verbalized understanding.  Patient has not been taking the Atorvastatin. Atorvastatin 10 mg once daily has been sent in for her.    Lennette Bihari, MD  12/23/2020 11:48 AM EST     CBC and chemistry stable except glucose elevated at 169.  Renal function improved and normal.  LDL cholesterol increased to 127 with total cholesterol 203.  TSH normal.  If patient is not taking atorvastatin 20 mg, consider retrial of 10 mg if at all possible.  If unable to take statin, then institute Zetia 10 mg daily.

## 2020-12-25 ENCOUNTER — Telehealth: Payer: Self-pay

## 2020-12-25 NOTE — Telephone Encounter (Signed)
Received fax from [employer] that requested to send patient the [facility] 2 kit. I called this patient and she stated that she told the man from this company NO that she did NOT want this. I let patient know that I will send declined and not approve this request. Patient verbalized an understanding.

## 2020-12-26 ENCOUNTER — Other Ambulatory Visit: Payer: Self-pay | Admitting: "Endocrinology

## 2021-01-06 ENCOUNTER — Ambulatory Visit (INDEPENDENT_AMBULATORY_CARE_PROVIDER_SITE_OTHER): Payer: Medicare PPO

## 2021-01-06 DIAGNOSIS — Z95810 Presence of automatic (implantable) cardiac defibrillator: Secondary | ICD-10-CM

## 2021-01-06 DIAGNOSIS — I5042 Chronic combined systolic (congestive) and diastolic (congestive) heart failure: Secondary | ICD-10-CM | POA: Diagnosis not present

## 2021-01-07 NOTE — Progress Notes (Signed)
EPIC Encounter for ICM Monitoring  Patient Name: Diana Campbell is a 75 y.o. female Date: 01/07/2021 Primary Care Physican: Ladon Applebaum Primary Cardiologist: Tresa Endo Electrophysiologist: Croitoru 12/02/2020 Office Weight: 209 lbs                                                            Spoke with patient and heart failure questions reviewed.  Pt asymptomatic for fluid accumulation.    Optivol thoracic impedance suggesting normal fluid levels.    Prescribed: No diuretic (Previously on Furosemide 40 mg 1 tablet as needed)   Labs: 08/08/2020 Creatinine 1.09, BUN 14, Potassium 4.5, Sodium 14777, GFR 53 04/29/2020 Creatinine 0.92, BUN 14, Potassium 4.1, Sodium 146, GFR 65  04/03/2020 Creatinine 0.91, BUN 14, Potassium 4.4, Sodium 140, GFR 65  A complete set of results can be found in Results Review.   Recommendations:  No changes and encouraged to call if experiencing any fluid symptoms.   Follow-up plan: ICM clinic phone appointment on 02/10/2021.   91 day device clinic remote transmission 01/29/2021.     EP/Cardiology Office Visits:  Recall 03/04/2021 with Dr Royann Shivers.      Copy of ICM check sent to Dr. Royann Shivers.   3 month ICM trend: 01/06/2021.    12-14 Month ICM trend:       [name], RN 01/07/2021 1:08 PM

## 2021-01-21 ENCOUNTER — Other Ambulatory Visit: Payer: Self-pay | Admitting: "Endocrinology

## 2021-01-21 ENCOUNTER — Other Ambulatory Visit: Payer: Self-pay

## 2021-01-21 ENCOUNTER — Ambulatory Visit: Payer: Medicare PPO | Admitting: "Endocrinology

## 2021-01-21 ENCOUNTER — Encounter: Payer: Self-pay | Admitting: "Endocrinology

## 2021-01-21 ENCOUNTER — Other Ambulatory Visit: Payer: Self-pay | Admitting: Cardiovascular Disease

## 2021-01-21 VITALS — BP 122/56 | HR 60 | Ht 66.0 in | Wt 206.8 lb

## 2021-01-21 DIAGNOSIS — E119 Type 2 diabetes mellitus without complications: Secondary | ICD-10-CM

## 2021-01-21 DIAGNOSIS — I10 Essential (primary) hypertension: Secondary | ICD-10-CM

## 2021-01-21 DIAGNOSIS — E782 Mixed hyperlipidemia: Secondary | ICD-10-CM

## 2021-01-21 DIAGNOSIS — Z794 Long term (current) use of insulin: Secondary | ICD-10-CM | POA: Diagnosis not present

## 2021-01-21 DIAGNOSIS — E118 Type 2 diabetes mellitus with unspecified complications: Secondary | ICD-10-CM

## 2021-01-21 LAB — POCT GLYCOSYLATED HEMOGLOBIN (HGB A1C): HbA1c, POC (controlled diabetic range): 6.5 % (ref 0.0–7.0)

## 2021-01-21 MED ORDER — ATORVASTATIN CALCIUM 20 MG PO TABS: 20.0000 mg | ORAL_TABLET | Freq: Every day | ORAL | 1 refills | Status: DC

## 2021-01-21 NOTE — Patient Instructions (Signed)

## 2021-01-21 NOTE — Progress Notes (Signed)
01/21/2021  Endocrinology follow-up note   Subjective:    Patient ID: Diana Campbell, female    DOB: 01-30-1945, PCP Jake Samples, PA-C   Past Medical History:  Diagnosis Date   Arthritis    Automatic implantable cardioverter-defibrillator in situ 07/2010   Cardiac abnormality    with defibrilator   Chronic combined systolic and diastolic CHF (congestive heart failure) ([place]) 09/04/2015   Diabetes mellitus    H/O cardiac arrest 2006   Hypercholesteremia    Hypertension    Neuropathy    Past Surgical History:  Procedure Laterality Date   CARDIAC CATHETERIZATION  06/22/2003   Severe nonischemic cardiomyopathy, 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   CARDIAC CATHETERIZATION  04/14/2000   Normal coronary arteries.   CARDIAC DEFIBRILLATOR PLACEMENT  07/21/2010   Medtronic Protecta XT DR, model#[id number], serial#[id number]   CARDIOVASCULAR STRESS TEST  04/05/2000   Scintigraphic evidence of mild LV dilatation and global LV dysfunction with an EF 41% with mild inferolateral ischemia.   CARPAL TUNNEL RELEASE Right 01/23/2016   Procedure: CARPAL TUNNEL RELEASE;  Surgeon: Carole Civil, MD;  Location: [facility];  Service: Orthopedics;  Laterality: Right;  pt knows to arrive at 6:15   COLONOSCOPY  03/30/2011   Procedure: COLONOSCOPY;  Surgeon: Jamesetta So, MD;  Location: [facility];  Service: Gastroenterology;  Laterality: N/A;   DORSAL COMPARTMENT RELEASE Right 08/11/2013   Procedure: RELEASE DORSAL COMPARTMENT (DEQUERVAIN);  Surgeon: Carole Civil, MD;  Location: [facility];  Service: Orthopedics;  Laterality: Right;   ICD GENERATOR CHANGEOUT N/A 10/29/2017   Procedure: ICD GENERATOR CHANGEOUT;  Surgeon: Sanda Klein, MD;  Location: [facility];  Service: Cardiovascular;  Laterality: N/A;   TRANSTHORACIC ECHOCARDIOGRAM  08/03/2011   EF 45-50%, mildly reduced LV systolic function.    Social History   Socioeconomic History   Marital status: Married    Spouse name: Not on file   Number of children: Not on file   Years of education: Not on file   Highest education level: Not on file  Occupational History   Not on file  Tobacco Use   Smoking status: Never   Smokeless tobacco: Current    Types: Snuff   Tobacco comments:    3 dips per day  Vaping Use   Vaping Use: Never used  Substance and Sexual Activity   Alcohol use: No   Drug use: No   Sexual activity: Yes    Birth control/protection: None  Other Topics Concern   Not on file  Social History Narrative   Not on file   Social Determinants of Health   Financial Resource Strain: Not on file  Food Insecurity: Not on file  Transportation Needs: Not on file  Physical Activity: Not on file  Stress: Not on file  Social Connections: Not on file   Outpatient Encounter Medications as of 01/21/2021  Medication Sig   ACCU-CHEK FASTCLIX LANCETS MISC 1 each by Does not apply route 2 (two) times daily.   acetaminophen (TYLENOL) 325 MG tablet Take 650 mg by mouth in the morning and at bedtime.   aspirin EC 81 MG tablet Take 1 tablet (81 mg total) by mouth daily.   atorvastatin (LIPITOR) 20 MG tablet Take 1 tablet (20 mg total) by mouth daily.   Blood Glucose Monitoring Suppl (ACCU-CHEK GUIDE) w/Device KIT Use as directed to check blood glucose four times daily   carvedilol (  COREG) 12.5 MG tablet TAKE 1/2 TABLET BY MOUTH TWICE A DAY WITH A MEAL   empagliflozin (JARDIANCE) 10 MG TABS tablet Take 1 tablet (10 mg total) by mouth daily.   HUMALOG MIX 75/25 KWIKPEN (75-25) 100 UNIT/ML KwikPen INJECT 20 UNITS INTO THE SKIN 2 (TWO) TIMES DAILY.   Lancets Misc. (ACCU-CHEK FASTCLIX LANCET) KIT 1 each by Does not apply route 2 (two) times daily.   [DISCONTINUED] ACCU-CHEK GUIDE test strip USE AS INSTRUCTED TO TEST BLOOD GLUCOSE FOUR TIMES DAILY   [DISCONTINUED] atorvastatin (LIPITOR) 10 MG tablet Take 1 tablet (10 mg total) by  mouth daily.   Facility-Administered Encounter Medications as of 01/21/2021  Medication   sacubitril-valsartan (ENTRESTO) 49-51 mg per tablet   ALLERGIES: No Known Allergies VACCINATION STATUS: Immunization History  Administered Date(s) Administered   Influenza-Unspecified 11/19/2017    Diabetes She is returns for follow-up in the management of her currently uncontrolled type 2 diabetes, hyperlipidemia, hypertension.   Onset time: She was diagnosed with diabetes at approximately 76 years of age.   Her disease course has been stable.    She presents with controlled glycemic profile, averaging between 147-166 over the last 30 days.  Her point-of-care A1c is 6.5%, progressively improving.   No major hypoglycemia is documented or reported. She has done relatively better on premixed insulin twice a day.  She is currently on Humalog 75/25 20 units twice daily.  She remains on Jardiance 10 mg p.o. daily, started by her cardiologist.    Pertinent negatives for hypoglycemia include no confusion, headaches, pallor or seizures. There are no diabetic associated symptoms. Pertinent negatives for diabetes include no chest pain, no polydipsia, no polyphagia and no polyuria. There are no hypoglycemic complications. Symptoms are worsening. Diabetic complications include nephropathy. Risk factors for coronary artery disease include diabetes mellitus, dyslipidemia, hypertension, sedentary lifestyle and tobacco exposure. Current diabetic treatment includes premixed insulin twice daily associated with monitoring of blood glucose at least 2 times a day.   She did not document any major hypoglycemia. Her weight is stable, she complains of poor appetite. She is following a generally unhealthy diet, regularly eats "junk food".  She has not had a previous visit with a dietitian (She declined a referral to CDE.)  She has uncontrolled dyslipidemia despite Lipitor 10 mg p.o. daily.  An ACE inhibitor/angiotensin II receptor  blocker is being taken. Eye exam is current.    Hyperlipidemia This is a chronic problem. The current episode started more than 1 year ago. Pertinent negatives include no chest pain, myalgias or shortness of breath. Current antihyperlipidemic treatment includes statins. Risk factors for coronary artery disease include dyslipidemia, diabetes mellitus and a sedentary lifestyle.  Her previsit labs show LDL at 127, despite Lipitor 10 mg p.o. daily. Hypertension This is a chronic problem. The current episode started more than 1 year ago. The problem is controlled. Pertinent negatives include no chest pain, headaches, palpitations or shortness of breath. Past treatments include ACE inhibitors. Hypertensive end-organ damage includes kidney disease.    Review of systems: Limited as above.   Objective:    BP (!) 122/56    Pulse 60    Ht 5' 6"  (1.676 m)    Wt 206 lb 12.8 oz (93.8 kg)    BMI 33.38 kg/m   Wt Readings from Last 3 Encounters:  01/21/21 206 lb 12.8 oz (93.8 kg)  12/02/20 209 lb 3.2 oz (94.9 kg)  09/09/20 203 lb 6.4 oz (92.3 kg)     Physical Exam- Limited  Constitutional:  Body mass index is 33.38 kg/m. , not in acute distress, normal state of mind Eyes:  EOMI, no exophthalmos    CMP Latest Ref Rng & Units 12/16/2020 08/08/2020 04/29/2020  Glucose 70 - 99 mg/dL 169(H) 147(H) 89  BUN 8 - 27 mg/dL 16 14 14   Creatinine 0.57 - 1.00 mg/dL 0.93 1.09(H) 0.92  Sodium 134 - 144 mmol/L 143 147(H) 146(H)  Potassium 3.5 - 5.2 mmol/L 4.1 4.5 4.1  Chloride 96 - 106 mmol/L 106 109(H) 107(H)  CO2 20 - 29 mmol/L 27 35(H) 24  Calcium 8.7 - 10.3 mg/dL 9.5 9.6 9.5  Total Protein 6.0 - 8.5 g/dL 6.7 6.7 6.8  Total Bilirubin 0.0 - 1.2 mg/dL 0.3 0.2 0.5  Alkaline Phos 44 - 121 IU/L 81 73 77  AST 0 - 40 IU/L 11 13 13   ALT 0 - 32 IU/L 7 6 8     Lab Results  Component Value Date   HGBA1C 6.5 01/21/2021   HGBA1C 6.4 09/09/2020   HGBA1C 7.1 (A) 05/06/2020   Lipid Panel     Component Value  Date/Time   CHOL 203 (H) 12/16/2020 0804   TRIG 93 12/16/2020 0804   HDL 59 12/16/2020 0804   CHOLHDL 3.4 12/16/2020 0804   CHOLHDL 3.5 08/28/2019 0851   VLDL 14 09/26/2015 0856   LDLCALC 127 (H) 12/16/2020 0804   LDLCALC 141 (H) 08/28/2019 0851     Assessment & Plan:   1. Type 2 diabetes mellitus with long-term current use of insulin (HCC)  -Her diabetes is complicated by cardiomyopathy and she remains at a high risk for more acute and chronic complications of diabetes which include CAD, CVA, CKD, retinopathy, and neuropathy. These are all discussed in detail with the patient.  Patient presents with continued improvement in her glycemic profile with point-of-care A1c of 6.5%.     Recent labs reviewed.   - I have re-counseled the patient on diet management and weight loss  by adopting a carbohydrate restricted / protein rich  Diet. - she acknowledges that there is a room for improvement in her food and drink choices. - Suggestion is made for her to avoid simple carbohydrates  from her diet including Cakes, Sweet Desserts, Ice Cream, Soda (diet and regular), Sweet Tea, Candies, Chips, Cookies, Store Bought Juices, Alcohol , Artificial Sweeteners,  Coffee Creamer, and "Sugar-free" Products, Lemonade. This will help patient to have more stable blood glucose profile and potentially avoid unintended weight gain.  The following Lifestyle Medicine recommendations according to [facility]) were discussed and and offered to patient and she  agrees to start the journey:  A. Whole Foods, Plant-Based Nutrition comprising of fruits and vegetables, plant-based proteins, whole-grain carbohydrates was discussed in detail with the patient.   A list for source of those nutrients were also provided to the patient.  Patient will use only water or unsweetened tea for hydration. B.  The need to stay away from risky substances including alcohol, smoking; obtaining 7 to 9  hours of restorative sleep, at least 150 minutes of moderate intensity exercise weekly, the importance of healthy social connections,  and stress management techniques were discussed. C.  A full color page of  Calorie density of various food groups per pound showing examples of each food groups was provided to the patient.    - Patient is advised to stick to a routine mealtimes to eat 3 meals  a day and avoid unnecessary snacks (  to snack only to correct hypoglycemia).  - The patient  declined referral to a  CDE for individualized DM education.  - I have approached patient with the following individualized plan to manage diabetes and patient agrees.   - Patient  worries a lot about hypoglycemia. -She has done very well on premixed insulin.  She is advised to continue Humalog 75/25 20 units at breakfast and 20 units at supper for Premeal blood glucose readings above 90 mg per DL.  She is advised to continue Jardiance 10 mg p.o. daily at breakfast, prescribed by her cardiologist.   -She is advised and agrees to continue monitoring of blood glucose 4 times a day-before meals and at bedtime.    -Patient is encouraged to call clinic for blood glucose levels less than 70 or above 200 mg /dl.  -Her labs show her renal function is improving. She would have benefited from metformin however she reports intolerance to metformin.   - Patient specific target  for A1c; LDL, HDL, Triglycerides,were discussed in detail.  2) BP/HTN:  -Her blood pressure is controlled to target.  She is not on Entresto instead of lisinopril.   She is advised to continue her current blood pressure medications .   3) Lipids/HPL: Recent lipid panel showed loss of control her LDL at 127.  She is approached with a higher dose of Lipitor 20 mg p.o. nightly.  The above detailed plan predominant lifestyle medicine nutrition will help with dyslipidemia besides type 2 diabetes.    4)  Weight/Diet: Her BMI 33.38--a candidate for  modest weight loss.  She declined CDE consult . exercise, and carbohydrates information provided.  5) Chronic Care/Health Maintenance:  -Patient is on ACEI/ARB and Statin medications and encouraged to continue to follow up with Ophthalmology, Podiatrist at least yearly or according to recommendations, and advised to  stay away from smoking. I have recommended yearly flu vaccine and pneumonia vaccination at least every 5 years; moderate intensity exercise for up to 150 minutes weekly; and  sleep for at least 7 hours a day.   - I advised patient to maintain close follow up with Jake Samples, PA-C for primary care needs.   I spent 44 minutes in the care of the patient today including review of labs from [facility], Lipids, Thyroid Function, Hematology (current and previous including abstractions from other facilities); face-to-face time discussing  her blood glucose readings/logs, discussing hypoglycemia and hyperglycemia episodes and symptoms, medications doses, her options of short and long term treatment based on the latest standards of care / guidelines;  discussion about incorporating lifestyle medicine;  and documenting the encounter.    Please refer to Patient Instructions for Blood Glucose Monitoring and Insulin/Medications Dosing Guide"  in media tab for additional information. Please  also refer to " Patient Self Inventory" in the Media  tab for reviewed elements of pertinent patient history.  Diana Campbell participated in the discussions, expressed understanding, and voiced agreement with the above plans.  All questions were answered to her satisfaction. she is encouraged to contact clinic should she have any questions or concerns prior to her return visit.   Follow up plan: -Return in about 4 months (around 05/21/2021) for Bring Meter and Logs- A1c in Office.  Glade Lloyd, MD Phone: [phone]  Fax: [phone]  -  This note was partially dictated with voice recognition software.  Similar sounding words can be transcribed inadequately or may not  be corrected upon review.  01/21/2021, 12:13 PM

## 2021-01-29 ENCOUNTER — Ambulatory Visit (INDEPENDENT_AMBULATORY_CARE_PROVIDER_SITE_OTHER): Payer: Medicare PPO

## 2021-01-29 DIAGNOSIS — I428 Other cardiomyopathies: Secondary | ICD-10-CM

## 2021-01-29 LAB — CUP PACEART REMOTE DEVICE CHECK
Battery Remaining Longevity: 80 mo
Battery Voltage: 2.99 V
Brady Statistic AP VP Percent: 0.06 %
Brady Statistic AP VS Percent: 94.88 %
Brady Statistic AS VP Percent: 0 %
Brady Statistic AS VS Percent: 5.06 %
Brady Statistic RA Percent Paced: 93.44 %
Brady Statistic RV Percent Paced: 0.06 %
Date Time Interrogation Session: 20230111001703
HighPow Impedance: 45 Ohm
HighPow Impedance: 56 Ohm
Implantable Lead Implant Date: 20120702
Implantable Lead Implant Date: 20120702
Implantable Lead Location: 753859
Implantable Lead Location: 753860
Implantable Lead Model: 5076
Implantable Lead Model: 6947
Implantable Pulse Generator Implant Date: 20191011
Lead Channel Impedance Value: 380 Ohm
Lead Channel Impedance Value: 437 Ohm
Lead Channel Impedance Value: 494 Ohm
Lead Channel Pacing Threshold Amplitude: 0.875 V
Lead Channel Pacing Threshold Amplitude: 0.875 V
Lead Channel Pacing Threshold Pulse Width: 0.4 ms
Lead Channel Pacing Threshold Pulse Width: 0.4 ms
Lead Channel Sensing Intrinsic Amplitude: 12.375 mV
Lead Channel Sensing Intrinsic Amplitude: 12.375 mV
Lead Channel Sensing Intrinsic Amplitude: 2.25 mV
Lead Channel Sensing Intrinsic Amplitude: 2.25 mV
Lead Channel Setting Pacing Amplitude: 1.75 V
Lead Channel Setting Pacing Amplitude: 2 V
Lead Channel Setting Pacing Pulse Width: 0.4 ms
Lead Channel Setting Sensing Sensitivity: 0.3 mV

## 2021-02-03 DIAGNOSIS — E6609 Other obesity due to excess calories: Secondary | ICD-10-CM | POA: Diagnosis not present

## 2021-02-03 DIAGNOSIS — Z6832 Body mass index (BMI) 32.0-32.9, adult: Secondary | ICD-10-CM | POA: Diagnosis not present

## 2021-02-03 DIAGNOSIS — M1991 Primary osteoarthritis, unspecified site: Secondary | ICD-10-CM | POA: Diagnosis not present

## 2021-02-03 DIAGNOSIS — G894 Chronic pain syndrome: Secondary | ICD-10-CM | POA: Diagnosis not present

## 2021-02-07 NOTE — Progress Notes (Signed)
Remote ICD transmission.   

## 2021-02-08 ENCOUNTER — Other Ambulatory Visit: Payer: Self-pay | Admitting: Cardiovascular Disease

## 2021-02-10 ENCOUNTER — Ambulatory Visit (INDEPENDENT_AMBULATORY_CARE_PROVIDER_SITE_OTHER): Payer: Medicare PPO

## 2021-02-10 DIAGNOSIS — I5042 Chronic combined systolic (congestive) and diastolic (congestive) heart failure: Secondary | ICD-10-CM | POA: Diagnosis not present

## 2021-02-10 DIAGNOSIS — Z95810 Presence of automatic (implantable) cardiac defibrillator: Secondary | ICD-10-CM | POA: Diagnosis not present

## 2021-02-11 ENCOUNTER — Telehealth: Payer: Self-pay

## 2021-02-11 NOTE — Telephone Encounter (Signed)
Remote ICM transmission received.  Attempted call to patient regarding ICM remote transmission and left detailed message per DPR.  Advised to return call for any fluid symptoms or questions. Next ICM remote transmission scheduled 03/17/2021.   °

## 2021-02-11 NOTE — Progress Notes (Signed)
EPIC Encounter for ICM Monitoring  Patient Name: Diana Campbell is a 76 y.o. female Date: 02/11/2021 Primary Care Physican: Scherrie Bateman Primary Cardiologist: Claiborne Billings Electrophysiologist: Croitoru 01/21/2021 Office Weight: 206 lbs                                                            Attempted call to patient and unable to reach.  Left detailed message per DPR regarding transmission. Transmission reviewed.    Optivol thoracic impedance suggesting normal fluid levels.    Prescribed: No diuretic (Previously on Furosemide 40 mg 1 tablet as needed)   Labs: 12/16/2021 Creatinine 0.93, BUN 16, Potassium 4.1, Sodium 143, GFR 63 08/08/2020 Creatinine 1.09, BUN 14, Potassium 4.5, Sodium 147, GFR 53 04/29/2020 Creatinine 0.92, BUN 14, Potassium 4.1, Sodium 146, GFR 65  04/03/2020 Creatinine 0.91, BUN 14, Potassium 4.4, Sodium 140, GFR 65  A complete set of results can be found in Results Review.   Recommendations:  Left voice mail with ICM number and encouraged to call if experiencing any fluid symptoms.   Follow-up plan: ICM clinic phone appointment on 03/17/2021.   91 day device clinic remote transmission 04/30/2021.     EP/Cardiology Office Visits:  Recall 03/04/2021 with Dr Sallyanne Kuster.   03/11/2021 with Dr Claiborne Billings.   Copy of ICM check sent to Dr. Sallyanne Kuster.    3 month ICM trend: 02/10/2021.    12-14 Month ICM trend:     [name], RN 02/11/2021 11:44 AM

## 2021-02-12 ENCOUNTER — Telehealth: Payer: Self-pay | Admitting: Cardiovascular Disease

## 2021-02-12 NOTE — Telephone Encounter (Signed)
Called patient back. Patient had a question regarding her Entresto dosage- on current medication list it states patient is taking 49-51 twice daily. Patient states she was unaware she was to stop the 24-26 twice daily dose. She just finished up on her last dose today of the low dose, and will start the increase dose tomorrow. However, in review of the chart it showed that Dr.Kelly back in November wanted her to change to the increase dose, and in a few months recheck ECHO- and see him after to review LV function. If patient is just now starting increase dose tomorrow, would you still want to keep the ECHO on 02/07- and for her to see you after.  Will route to MD to review.  Thanks!

## 2021-02-12 NOTE — Telephone Encounter (Signed)
Pt has questions about her medications.. please call and advise

## 2021-02-19 NOTE — Telephone Encounter (Signed)
Defer echo for one month

## 2021-02-20 NOTE — Telephone Encounter (Signed)
Left message to call back  

## 2021-02-21 NOTE — Telephone Encounter (Signed)
Called, advised of message from MD to push it back one month.   Schedulers can we please get ECHO reschedule for beginning of March 2023.   Follow up with Dr.Kelly rescheduled at the end of March 2023.  Patient verbalized understanding.

## 2021-02-25 ENCOUNTER — Other Ambulatory Visit (HOSPITAL_COMMUNITY): Payer: Medicare PPO

## 2021-03-11 ENCOUNTER — Ambulatory Visit: Payer: Medicare PPO | Admitting: Cardiovascular Disease

## 2021-03-11 ENCOUNTER — Telehealth: Payer: Self-pay | Admitting: "Endocrinology

## 2021-03-11 NOTE — Telephone Encounter (Signed)
Deliver my meds called and said they have faxed several times for diabetic shoes. They will fax again, please send back to the fax number on sheet with office notes.

## 2021-03-11 NOTE — Telephone Encounter (Signed)
Received paperwork this morning, put it on Dr.Nida's desk to be filled out.

## 2021-03-13 ENCOUNTER — Other Ambulatory Visit: Payer: Self-pay | Admitting: "Endocrinology

## 2021-03-13 ENCOUNTER — Other Ambulatory Visit: Payer: Self-pay | Admitting: Cardiovascular Disease

## 2021-03-13 DIAGNOSIS — E1165 Type 2 diabetes mellitus with hyperglycemia: Secondary | ICD-10-CM | POA: Diagnosis not present

## 2021-03-16 ENCOUNTER — Other Ambulatory Visit: Payer: Self-pay | Admitting: Cardiovascular Disease

## 2021-03-17 ENCOUNTER — Ambulatory Visit (INDEPENDENT_AMBULATORY_CARE_PROVIDER_SITE_OTHER): Payer: Medicare PPO

## 2021-03-17 DIAGNOSIS — Z95810 Presence of automatic (implantable) cardiac defibrillator: Secondary | ICD-10-CM | POA: Diagnosis not present

## 2021-03-17 DIAGNOSIS — I5042 Chronic combined systolic (congestive) and diastolic (congestive) heart failure: Secondary | ICD-10-CM | POA: Diagnosis not present

## 2021-03-19 ENCOUNTER — Telehealth: Payer: Self-pay

## 2021-03-19 NOTE — Progress Notes (Signed)
EPIC Encounter for ICM Monitoring  Patient Name: Diana Campbell is a 76 y.o. female Date: 03/19/2021 Primary Care Physican: Scherrie Bateman Primary Cardiologist: Claiborne Billings Electrophysiologist: Croitoru 01/21/2021 Office Weight: 206 lbs                                                            Attempted call to patient and unable to reach.  Left detailed message per DPR regarding transmission. Transmission reviewed.    Optivol thoracic impedance suggesting normal fluid levels.    Prescribed: No diuretic (Previously on Furosemide 40 mg 1 tablet as needed)   Labs: 12/16/2020 Creatinine 0.93, BUN 16, Potassium 4.1, Sodium 143, GFR 63 08/08/2020 Creatinine 1.09, BUN 14, Potassium 4.5, Sodium 147, GFR 53 04/29/2020 Creatinine 0.92, BUN 14, Potassium 4.1, Sodium 146, GFR 65  04/03/2020 Creatinine 0.91, BUN 14, Potassium 4.4, Sodium 140, GFR 65  A complete set of results can be found in Results Review.   Recommendations:  Left voice mail with ICM number and encouraged to call if experiencing any fluid symptoms.   Follow-up plan: ICM clinic phone appointment on 04/21/2021.   91 day device clinic remote transmission 04/30/2021.     EP/Cardiology Office Visits:  Recall 03/04/2021 with Dr Sallyanne Kuster.   04/11/2021 with Dr Claiborne Billings.   Copy of ICM check sent to Dr. Sallyanne Kuster.    3 month ICM trend: 03/17/2021.    12-14 Month ICM trend:     [name], RN 03/19/2021 11:53 AM

## 2021-03-19 NOTE — Telephone Encounter (Signed)
Remote ICM transmission received.  Attempted call to patient regarding ICM remote transmission and left detailed message per DPR.  Advised to return call for any fluid symptoms or questions. Next ICM remote transmission scheduled 04/21/2021.   ? ?

## 2021-03-21 ENCOUNTER — Other Ambulatory Visit: Payer: Self-pay

## 2021-03-21 ENCOUNTER — Ambulatory Visit (HOSPITAL_COMMUNITY): Payer: Medicare PPO | Attending: Cardiology

## 2021-03-21 DIAGNOSIS — Z8674 Personal history of sudden cardiac arrest: Secondary | ICD-10-CM | POA: Insufficient documentation

## 2021-03-21 DIAGNOSIS — I5042 Chronic combined systolic (congestive) and diastolic (congestive) heart failure: Secondary | ICD-10-CM | POA: Diagnosis not present

## 2021-03-21 DIAGNOSIS — I110 Hypertensive heart disease with heart failure: Secondary | ICD-10-CM | POA: Insufficient documentation

## 2021-03-21 DIAGNOSIS — Z95810 Presence of automatic (implantable) cardiac defibrillator: Secondary | ICD-10-CM | POA: Diagnosis not present

## 2021-03-21 DIAGNOSIS — E785 Hyperlipidemia, unspecified: Secondary | ICD-10-CM | POA: Diagnosis not present

## 2021-03-21 DIAGNOSIS — I519 Heart disease, unspecified: Secondary | ICD-10-CM | POA: Diagnosis not present

## 2021-03-21 DIAGNOSIS — E119 Type 2 diabetes mellitus without complications: Secondary | ICD-10-CM | POA: Insufficient documentation

## 2021-03-21 DIAGNOSIS — I5030 Unspecified diastolic (congestive) heart failure: Secondary | ICD-10-CM | POA: Diagnosis not present

## 2021-03-21 LAB — ECHOCARDIOGRAM COMPLETE
Area-P 1/2: 2.83 cm2
S' Lateral: 4.2 cm

## 2021-03-24 ENCOUNTER — Telehealth: Payer: Self-pay | Admitting: Cardiovascular Disease

## 2021-03-24 NOTE — Telephone Encounter (Addendum)
?*  STAT* If patient is at the pharmacy, call can be transferred to refill team. ? ? ?1. Which medications need to be refilled? (please list name of each medication and dose if known) sacubitril-valsartan (ENTRESTO) 49-51 mg per tablet ? ?2. Which pharmacy/location (including street and city if local pharmacy) is medication to be sent to? [id number] [place], [place] - [address] ? ?3. Do they need a 30 day or 90 day supply? 90 day  ? ?Patient is out of medication ?

## 2021-03-27 ENCOUNTER — Other Ambulatory Visit: Payer: Self-pay

## 2021-03-27 MED ORDER — SACUBITRIL-VALSARTAN 49-51 MG PO TABS: 1.0000 | ORAL_TABLET | Freq: Two times a day (BID) | ORAL | 2 refills | Status: DC

## 2021-03-27 NOTE — Telephone Encounter (Signed)
RX sent to pharmacy  

## 2021-04-11 ENCOUNTER — Encounter: Payer: Self-pay | Admitting: Cardiovascular Disease

## 2021-04-11 ENCOUNTER — Other Ambulatory Visit: Payer: Self-pay

## 2021-04-11 ENCOUNTER — Ambulatory Visit: Payer: Medicare PPO | Admitting: Cardiovascular Disease

## 2021-04-11 DIAGNOSIS — I428 Other cardiomyopathies: Secondary | ICD-10-CM

## 2021-04-11 DIAGNOSIS — Z79899 Other long term (current) drug therapy: Secondary | ICD-10-CM | POA: Diagnosis not present

## 2021-04-11 DIAGNOSIS — M25473 Effusion, unspecified ankle: Secondary | ICD-10-CM | POA: Diagnosis not present

## 2021-04-11 DIAGNOSIS — I10 Essential (primary) hypertension: Secondary | ICD-10-CM

## 2021-04-11 DIAGNOSIS — E669 Obesity, unspecified: Secondary | ICD-10-CM

## 2021-04-11 DIAGNOSIS — I5042 Chronic combined systolic (congestive) and diastolic (congestive) heart failure: Secondary | ICD-10-CM

## 2021-04-11 DIAGNOSIS — Z95810 Presence of automatic (implantable) cardiac defibrillator: Secondary | ICD-10-CM | POA: Diagnosis not present

## 2021-04-11 DIAGNOSIS — E118 Type 2 diabetes mellitus with unspecified complications: Secondary | ICD-10-CM

## 2021-04-11 DIAGNOSIS — E782 Mixed hyperlipidemia: Secondary | ICD-10-CM

## 2021-04-11 DIAGNOSIS — Z794 Long term (current) use of insulin: Secondary | ICD-10-CM

## 2021-04-11 MED ORDER — ATORVASTATIN CALCIUM 20 MG PO TABS: 20.0000 mg | ORAL_TABLET | Freq: Every day | ORAL | 1 refills | Status: DC

## 2021-04-11 MED ORDER — SPIRONOLACTONE 25 MG PO TABS: 12.5000 mg | ORAL_TABLET | Freq: Every day | ORAL | 3 refills | Status: DC

## 2021-04-11 NOTE — Progress Notes (Signed)
Patient ID: Diana Campbell, female   DOB: October 21, 1945, 76 y.o.   MRN: [id number] ? ? ? ? ?HPI: Diana Campbell is a 76 y.o. female who presents to the office for a 4 month  follow-up cardiology evaluation.      ? ?Diana Campbell suffered a ventricular fibrillation cardiac arrest in June 2005 at which time she was successfully resuscitated. She was found to have a nonischemic cardiomyopathy;  initial ejection fraction 15%. Catheterization revealed 30-40% intermediate stenosis. At that time, she underwent single-chamber Medtronic ICD implantation and initially had a spring fidelis lead. In July 2012 she underwent complete revision of her system. ? ?An echo Doppler study in July 2013 showed an ejection fraction now at 45-50% with mild global hypokinesis. She did have mild MR, mild TR, and mild aortic sclerosis. ? ?Additional problems include type 2 diabetes mellitus on insulin, hypertension, hyperlipidemia, and probable untreated obstructive sleep apnea.  In the past she has had some mild edema. ? ?She has been followed by Dr. Sallyanne Kuster for her defibrillator.  In September 2018  ICD interrogation showed normal device function, normal lead parameters, no episodes of ventricular tachycardia and she was atrially pacing 86% of the time.  There was no ventricular pacing.  She had a favorable heart rate histogram and thoracic impedance was fairly stable but at times she had some evidence for fluid overload. ? ?When I last saw her in March 2019, she denied any episodes of chest pain.  She was experiencing mild shortness of breath particularly when she bends over and also admitted to some mild ankle swelling.  She has chronic low back pain and for this reason she does not exercise.  Since it had been 6 years since her last echo Doppler study I recommended she undergo a follow-up echocardiographic evaluation.  He echo Doppler study showed an EF of 40 to 45% with mild LV dilation and diffuse hypokinesis.  There is no significant  valvular abnormalities. ? ?She underwent elective generator change out for her ICD in October 2019.  She has an abandoned lead and unfortunately cannot have MRIs even though she has an MRI conditional system.  She was hospitalized January 2020 with sepsis secondary to UTI.  She has continued to be followed by Dr. Sallyanne Kuster for the next. ? ?She was evaluated by Kerin Ransom in 2020, [place], [place] she has continued to see Dr. Sallyanne Kuster.  She has been transitioned to [facility] and has been treated with carvedilol and loop diuretic.  She was seen by Harrington Challenger, RPH-CPP most recently in March 2022.  At that time, the patient wanted to be off Entresto but further discussion was made and the patient was willing to continue [facility] for another month.  She felt the [name] was causing her blood pressure to go up but apparently her diet was poor and she was eating cookies and soda for breakfast and continue to eat hotdogs and food high in sodium most likely contributing to her blood pressure elevation. ? ?I saw her in July 2022 after not having seen her since March 2019.  She had stopped her Entresto.  She denied she subsequently stopped her Entresto.  She denied chest pain ,PND or orthopnea.  When last seen by Dr. Sallyanne Kuster there was some discussion about possible SGLT2 inhibition.  During 1, I recommended a retrial of Entresto 24/26 mg twice a day.  I also recommended initiation of Jardiance 10 mg.   ? ?I last saw her on November 22, 2020.  At time she was tolerating Entresto 24/26 mg twice a day.  I recommended further titration of her Entresto to 49/51 mg twice a day.  She denied any chest pain.  I did her with samples of Jardiance 10 mg for SGLT 2 inhibition and recommended follow-up pharmacy evaluation.  I also recommended a follow-up echo Doppler study prior to her next office visit. ? ?On March 21, 2021 she underwent her follow-up echo Doppler study which showed an EF of 30 to 35% with global hypokinesis.  There  was grade 1 diastolic dysfunction, aortic valve sclerosis without stenosis, and mild dilation of left atrium. ? ?Since I last saw her, she denies any chest pain.  She does admit to some shortness of breath particularly with walking to the mailbox.  At times she also notes trace swelling in her ankles and she takes furosemide every 3 to 4 days on a as needed basis.  Presently her medical regimen consists of Entresto 49/51 mg twice a day, Jardiance 10 mg daily, carvedilol 6.25 mg twice a day, and she was to have increased to atorvastatin to 20 mg but apparently she is only taking 10 mg.  He continues to undergo ICD remote defibrillator checks sees Dr. Sallyanne Kuster.  She presents for evaluation. ? ?Past Medical History:  ?Diagnosis Date  ? Arthritis   ? Automatic implantable cardioverter-defibrillator in situ 07/2010  ? Cardiac abnormality   ? with defibrilator  ? Chronic combined systolic and diastolic CHF (congestive heart failure) ([place]) 09/04/2015  ? Diabetes mellitus   ? H/O cardiac arrest 2006  ? Hypercholesteremia   ? Hypertension   ? Neuropathy   ? ? ?Past Surgical History:  ?Procedure Laterality Date  ? CARDIAC CATHETERIZATION  06/22/2003  ? Severe nonischemic cardiomyopathy, 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  ? CARDIAC CATHETERIZATION  04/14/2000  ? Normal coronary arteries.  ? CARDIAC DEFIBRILLATOR PLACEMENT  07/21/2010  ? Medtronic Protecta XT DR, model#D314DRG, serial#[id number]  ? CARDIOVASCULAR STRESS TEST  04/05/2000  ? Scintigraphic evidence of mild LV dilatation and global LV dysfunction with an EF 41% with mild inferolateral ischemia.  ? CARPAL TUNNEL RELEASE Right 01/23/2016  ? Procedure: CARPAL TUNNEL RELEASE;  Surgeon: Carole Civil, MD;  [facility]: [facility];  Service: Orthopedics;  Laterality: Right;  pt knows to arrive at 6:15  ? COLONOSCOPY  03/30/2011  ? Procedure: COLONOSCOPY;  Surgeon: Jamesetta So, MD;   [facility];  Service: Gastroenterology;  Laterality: N/A;  ? DORSAL COMPARTMENT RELEASE Right 08/11/2013  ? Procedure: RELEASE DORSAL COMPARTMENT (DEQUERVAIN);  Surgeon: Carole Civil, MD;  Location: [facility];  Service: Orthopedics;  Laterality: Right;  ? ICD GENERATOR CHANGEOUT N/A 10/29/2017  ? Procedure: ICD GENERATOR CHANGEOUT;  Surgeon: Sanda Klein, MD;  Location: [place] [facility];  Service: Cardiovascular;  Laterality: N/A;  ? TRANSTHORACIC ECHOCARDIOGRAM  08/03/2011  ? EF 45-50%, mildly reduced LV systolic function.  ? ? ?No Known Allergies ? ?Current Outpatient Medications  ?Medication Sig Dispense Refill  ? ACCU-CHEK FASTCLIX LANCETS MISC 1 each by Does not apply route 2 (two) times daily. 200 each 5  ? ACCU-CHEK GUIDE test strip USE AS INSTRUCTED TO TEST BLOOD GLUCOSE FOUR TIMES DAILY 400 strip 1  ? acetaminophen (TYLENOL) 325 MG tablet Take 650 mg by mouth in the morning and at bedtime.    ? aspirin EC 81 MG tablet Take 1 tablet (81 mg total) by mouth daily.  90 tablet 3  ? Blood Glucose Monitoring Suppl (ACCU-CHEK GUIDE) w/Device KIT Use as directed to check blood glucose four times daily 1 kit 0  ? carvedilol (COREG) 12.5 MG tablet TAKE 1/2 TABLET BY MOUTH TWICE A DAY WITH A MEAL 90 tablet 1  ? empagliflozin (JARDIANCE) 10 MG TABS tablet Take 1 tablet (10 mg total) by mouth daily. 90 tablet 3  ? HUMALOG MIX 75/25 KWIKPEN (75-25) 100 UNIT/ML KwikPen INJECT 20 UNITS INTO THE SKIN 2 (TWO) TIMES DAILY. 30 mL 1  ? Lancets Misc. (ACCU-CHEK FASTCLIX LANCET) KIT 1 each by Does not apply route 2 (two) times daily. 1 kit 2  ? sacubitril-valsartan (ENTRESTO) 49-51 MG Take 1 tablet by mouth 2 (two) times daily. 60 tablet 2  ? spironolactone (ALDACTONE) 25 MG tablet Take 0.5 tablets (12.5 mg total) by mouth daily. 90 tablet 3  ? atorvastatin (LIPITOR) 20 MG tablet Take 1 tablet (20 mg total) by mouth daily. 90 tablet 1  ? ?No current facility-administered medications for this visit.  ? ? ?Social  History  ? ?Socioeconomic History  ? Marital status: Married  ?  Spouse name: Not on file  ? Number of children: Not on file  ? Years of education: Not on file  ? Highest education level: Not on file  ?Occupatio

## 2021-04-11 NOTE — Patient Instructions (Signed)
Medication Instructions:  ?INCREASE ATORVASTATIN 20MG  DAILY ? ?START SPIRONOLACTONE 12.5MG  (1/2 TAB) ?*If you need a refill on your cardiac medications before your next appointment, please call your pharmacy* ? ?Lab Work: ?FASTING LIPID, PRO-BNP, CMET AND CBC IN 2-3 WEEKS ?If you have labs (blood work) drawn today and your tests are completely normal, you will receive your results only by:  [place] (if you have MyChart) OR A paper copy in the mail.  If you have any lab test that is abnormal or we need to change your treatment, we will call you to review the results. You may go to any [facility] that is convenient for you however, we do have a lab in our office that is able to assist you. You DO NOT need an appointment for our lab. The lab is open 8:00am and closes at 4:00pm. Lunch 12:45 - 1:45pm. ? ?Follow-Up: ?Your next appointment:  4 month(s) In Person with Shelva Majestic, MD    ? ?At [facility], you and your health needs are our priority.  As part of our continuing mission to provide you with exceptional heart care, we have created designated Provider Care Teams.  These Care Teams include your primary Cardiologist (physician) and Advanced Practice Providers (APPs -  Physician Assistants and Nurse Practitioners) who all work together to provide you with the care you need, when you need it. ? ?We recommend signing up for the patient portal called "MyChart".  Sign up information is provided on this After Visit Summary.  MyChart is used to connect with patients for Virtual Visits (Telemedicine).  Patients are able to view lab/test results, encounter notes, upcoming appointments, etc.  Non-urgent messages can be sent to your provider as well.   ?To learn more about what you can do with MyChart, go to [email or web address].   ? ? ?

## 2021-04-21 ENCOUNTER — Ambulatory Visit (INDEPENDENT_AMBULATORY_CARE_PROVIDER_SITE_OTHER): Payer: Medicare PPO

## 2021-04-21 DIAGNOSIS — I5042 Chronic combined systolic (congestive) and diastolic (congestive) heart failure: Secondary | ICD-10-CM | POA: Diagnosis not present

## 2021-04-21 DIAGNOSIS — Z95810 Presence of automatic (implantable) cardiac defibrillator: Secondary | ICD-10-CM

## 2021-04-23 ENCOUNTER — Telehealth: Payer: Self-pay

## 2021-04-23 NOTE — Progress Notes (Signed)
EPIC Encounter for ICM Monitoring ? ?Patient Name: Diana Campbell is a 76 y.o. female ?Date: 04/23/2021 ?Primary Care Physican: Jake Samples, PA-C ?Primary Cardiologist: Claiborne Billings ?Electrophysiologist: Croitoru ?01/21/2021 Office Weight: 206 lbs ?                                                         ?  ?Attempted call to patient and unable to reach.  Left detailed message per DPR regarding transmission. Transmission reviewed.  ?  ?Optivol thoracic impedance suggesting normal fluid levels.  ?  ?Prescribed: No diuretic (Previously on Furosemide 40 mg 1 tablet as needed) ?Spironolactone 25 mg take 0.5 tablet (12.5 mg total) by mouth daily. ?  ?Labs: ?12/16/2020 Creatinine 0.93, BUN 16, Potassium 4.1, Sodium 143, GFR 63 ?08/08/2020 Creatinine 1.09, BUN 14, Potassium 4.5, Sodium 147, GFR 53 ?04/29/2020 Creatinine 0.92, BUN 14, Potassium 4.1, Sodium 146, GFR 65  ?04/03/2020 Creatinine 0.91, BUN 14, Potassium 4.4, Sodium 140, GFR 65  ?A complete set of results can be found in Results Review. ?  ?Recommendations:  Left voice mail with ICM number and encouraged to call if experiencing any fluid symptoms. ?  ?Follow-up plan: ICM clinic phone appointment on 05/26/2021.   91 day device clinic remote transmission 07/30/2021.   ?  ?EP/Cardiology Office Visits:  Recall 03/04/2021 with Dr Sallyanne Kuster.   08/13/2021 with Dr Claiborne Billings. ?  ?Copy of ICM check sent to Dr. Sallyanne Kuster.  ? ?3 month ICM trend: 04/21/2021. ? ? ? ?12-14 Month ICM trend:  ? ? ? ?[name], RN ?04/23/2021 ?11:57 AM ? ?

## 2021-04-23 NOTE — Telephone Encounter (Signed)
Remote ICM transmission received.  Attempted call to patient regarding ICM remote transmission and left detailed message per DPR.  Advised to return call for any fluid symptoms or questions. Next ICM remote transmission scheduled 05/26/2021.   ? ?

## 2021-04-30 ENCOUNTER — Ambulatory Visit (INDEPENDENT_AMBULATORY_CARE_PROVIDER_SITE_OTHER): Payer: Medicare PPO

## 2021-04-30 DIAGNOSIS — I428 Other cardiomyopathies: Secondary | ICD-10-CM

## 2021-04-30 LAB — CUP PACEART REMOTE DEVICE CHECK
Battery Remaining Longevity: 73 mo
Battery Voltage: 2.99 V
Brady Statistic AP VP Percent: 0.05 %
Brady Statistic AP VS Percent: 95.81 %
Brady Statistic AS VP Percent: 0 %
Brady Statistic AS VS Percent: 4.15 %
Brady Statistic RA Percent Paced: 94.97 %
Brady Statistic RV Percent Paced: 0.05 %
Date Time Interrogation Session: 20230412022605
HighPow Impedance: 45 Ohm
HighPow Impedance: 57 Ohm
Implantable Lead Implant Date: 20120702
Implantable Lead Implant Date: 20120702
Implantable Lead Location: 753859
Implantable Lead Location: 753860
Implantable Lead Model: 5076
Implantable Lead Model: 6947
Implantable Pulse Generator Implant Date: 20191011
Lead Channel Impedance Value: 380 Ohm
Lead Channel Impedance Value: 494 Ohm
Lead Channel Impedance Value: 494 Ohm
Lead Channel Pacing Threshold Amplitude: 0.875 V
Lead Channel Pacing Threshold Amplitude: 0.875 V
Lead Channel Pacing Threshold Pulse Width: 0.4 ms
Lead Channel Pacing Threshold Pulse Width: 0.4 ms
Lead Channel Sensing Intrinsic Amplitude: 12 mV
Lead Channel Sensing Intrinsic Amplitude: 12 mV
Lead Channel Sensing Intrinsic Amplitude: 2.25 mV
Lead Channel Sensing Intrinsic Amplitude: 2.25 mV
Lead Channel Setting Pacing Amplitude: 1.75 V
Lead Channel Setting Pacing Amplitude: 2 V
Lead Channel Setting Pacing Pulse Width: 0.4 ms
Lead Channel Setting Sensing Sensitivity: 0.3 mV

## 2021-05-16 NOTE — Progress Notes (Signed)
Remote ICD transmission.   

## 2021-05-21 ENCOUNTER — Ambulatory Visit: Payer: Medicare PPO | Admitting: "Endocrinology

## 2021-05-21 ENCOUNTER — Encounter: Payer: Self-pay | Admitting: "Endocrinology

## 2021-05-21 VITALS — BP 92/50 | HR 64 | Ht 66.0 in | Wt 208.4 lb

## 2021-05-21 DIAGNOSIS — E782 Mixed hyperlipidemia: Secondary | ICD-10-CM | POA: Diagnosis not present

## 2021-05-21 DIAGNOSIS — Z794 Long term (current) use of insulin: Secondary | ICD-10-CM

## 2021-05-21 DIAGNOSIS — I10 Essential (primary) hypertension: Secondary | ICD-10-CM | POA: Diagnosis not present

## 2021-05-21 DIAGNOSIS — E119 Type 2 diabetes mellitus without complications: Secondary | ICD-10-CM | POA: Diagnosis not present

## 2021-05-21 NOTE — Patient Instructions (Signed)

## 2021-05-21 NOTE — Progress Notes (Signed)
? ?05/21/2021 ? ?Endocrinology follow-up note ? ? ?Subjective:  ? ? Patient ID: Diana Campbell, female    DOB: February 23, 1945, PCP Jake Samples, PA-C ? ? ?Past Medical History:  ?Diagnosis Date  ? Arthritis   ? Automatic implantable cardioverter-defibrillator in situ 07/2010  ? Cardiac abnormality   ? with defibrilator  ? Chronic combined systolic and diastolic CHF (congestive heart failure) ([place]) 09/04/2015  ? Diabetes mellitus   ? H/O cardiac arrest 2006  ? Hypercholesteremia   ? Hypertension   ? Neuropathy   ? ?Past Surgical History:  ?Procedure Laterality Date  ? CARDIAC CATHETERIZATION  06/22/2003  ? Severe nonischemic cardiomyopathy, 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  ? CARDIAC CATHETERIZATION  04/14/2000  ? Normal coronary arteries.  ? CARDIAC DEFIBRILLATOR PLACEMENT  07/21/2010  ? Medtronic Protecta XT DR, model#D314DRG, serial#[id number]  ? CARDIOVASCULAR STRESS TEST  04/05/2000  ? Scintigraphic evidence of mild LV dilatation and global LV dysfunction with an EF 41% with mild inferolateral ischemia.  ? CARPAL TUNNEL RELEASE Right 01/23/2016  ? Procedure: CARPAL TUNNEL RELEASE;  Surgeon: Carole Civil, MD;  Location: [facility];  Service: Orthopedics;  Laterality: Right;  pt knows to arrive at 6:15  ? COLONOSCOPY  03/30/2011  ? Procedure: COLONOSCOPY;  Surgeon: Jamesetta So, MD;  [facility];  Service: Gastroenterology;  Laterality: N/A;  ? DORSAL COMPARTMENT RELEASE Right 08/11/2013  ? Procedure: RELEASE DORSAL COMPARTMENT (DEQUERVAIN);  Surgeon: Carole Civil, MD;  Location: [facility];  Service: Orthopedics;  Laterality: Right;  ? ICD GENERATOR CHANGEOUT N/A 10/29/2017  ? Procedure: ICD GENERATOR CHANGEOUT;  Surgeon: Sanda Klein, MD;  Location: [place] [facility];  Service: Cardiovascular;  Laterality: N/A;  ? TRANSTHORACIC ECHOCARDIOGRAM  08/03/2011  ? EF 45-50%, mildly reduced LV systolic function.   ? ?Social History  ? ?Socioeconomic History  ? Marital status: Married  ?  Spouse name: Not on file  ? Number of children: Not on file  ? Years of education: Not on file  ? Highest education level: Not on file  ?Occupational History  ? Not on file  ?Tobacco Use  ? Smoking status: Never  ? Smokeless tobacco: Current  ?  Types: Snuff  ? Tobacco comments:  ?  3 dips per day  ?Vaping Use  ? Vaping Use: Never used  ?Substance and Sexual Activity  ? Alcohol use: No  ? Drug use: No  ? Sexual activity: Yes  ?  Birth control/protection: None  ?Other Topics Concern  ? Not on file  ?Social History Narrative  ? Not on file  ? ?Social Determinants of Health  ? ?Financial Resource Strain: Not on file  ?Food Insecurity: Not on file  ?Transportation Needs: Not on file  ?Physical Activity: Not on file  ?Stress: Not on file  ?Social Connections: Not on file  ? ?Outpatient Encounter Medications as of 05/21/2021  ?Medication Sig  ? ACCU-CHEK FASTCLIX LANCETS MISC 1 each by Does not apply route 2 (two) times daily.  ? ACCU-CHEK GUIDE test strip USE AS INSTRUCTED TO TEST BLOOD GLUCOSE FOUR TIMES DAILY  ? acetaminophen (TYLENOL) 325 MG tablet Take 650 mg by mouth in the morning and at bedtime.  ? aspirin EC 81 MG tablet Take 1 tablet (81 mg total) by mouth daily.  ? atorvastatin (LIPITOR) 20 MG tablet Take 1 tablet (20 mg total) by mouth daily.  ? Blood Glucose Monitoring Suppl (ACCU-CHEK  GUIDE) w/Device KIT Use as directed to check blood glucose four times daily  ? carvedilol (COREG) 12.5 MG tablet TAKE 1/2 TABLET BY MOUTH TWICE A DAY WITH A MEAL  ? empagliflozin (JARDIANCE) 10 MG TABS tablet Take 1 tablet (10 mg total) by mouth daily.  ? HUMALOG MIX 75/25 KWIKPEN (75-25) 100 UNIT/ML KwikPen INJECT 20 UNITS INTO THE SKIN 2 (TWO) TIMES DAILY.  ? Lancets Misc. (ACCU-CHEK FASTCLIX LANCET) KIT 1 each by Does not apply route 2 (two) times daily.  ? sacubitril-valsartan (ENTRESTO) 49-51 MG Take 1 tablet by mouth 2 (two) times daily.  ?  spironolactone (ALDACTONE) 25 MG tablet Take 0.5 tablets (12.5 mg total) by mouth daily.  ? ?No facility-administered encounter medications on file as of 05/21/2021.  ? ?ALLERGIES: ?No Known Allergies ?VACCINATION STATUS: ?Immunization History  ?Administered Date(s) Administered  ? Influenza-Unspecified 11/19/2017  ? ? ?Diabetes ?She is returns for follow-up in the management of her currently uncontrolled type 2 diabetes, hyperlipidemia, hypertension.   ?Onset time: She was diagnosed with diabetes at approximately 76 years of age.  She is responding to her current insulin regimen.   She presents with controlled glycemic profile, averaging between 125-155 milligrams per DL over the last 30 days.  Her point-of-care A1c is 6.4%, progressively improving.   ? ?No major hypoglycemia is documented or reported. ?She has done relatively better on premixed insulin twice a day.  She is currently on Humalog 75/25 20 units twice daily.  She remains on Jardiance 10 mg p.o. daily, started by her cardiologist.   ? ?Pertinent negatives for hypoglycemia include no confusion, headaches, pallor or seizures. There are no diabetic associated symptoms. Pertinent negatives for diabetes include no chest pain, no polydipsia, no polyphagia and no polyuria. There are no hypoglycemic complications. Symptoms are worsening. Diabetic complications include nephropathy. Risk factors for coronary artery disease include diabetes mellitus, dyslipidemia, hypertension, sedentary lifestyle and tobacco exposure. Current diabetic treatment includes premixed insulin twice daily associated with monitoring of blood glucose at least 2 times a day.   ?She did not document any major hypoglycemia. ?Her weight is stable, she complains of poor appetite. She is following a generally unhealthy diet, regularly eats "junk food".  She has not had a previous visit with a dietitian (She declined a referral to CDE.)  She has uncontrolled dyslipidemia despite Lipitor 10 mg  p.o. daily. ? ?An ACE inhibitor/angiotensin II receptor blocker is being taken. Eye exam is current.  ? ? ?Hyperlipidemia ?This is a chronic problem. The current episode started more than 1 year ago. Pertinent negatives include no chest pain, myalgias or shortness of breath. Current antihyperlipidemic treatment includes statins. Risk factors for coronary artery disease include dyslipidemia, diabetes mellitus and a sedentary lifestyle.  Her previsit labs show LDL at 127, despite Lipitor 10 mg p.o. daily. ?Hypertension ?This is a chronic problem. The current episode started more than 1 year ago. The problem is controlled. Pertinent negatives include no chest pain, headaches, palpitations or shortness of breath. Past treatments include ACE inhibitors. Hypertensive end-organ damage includes kidney disease.  ? ? ?Review of systems: Limited as above. ? ? ?Objective:  ?  ?BP (!) 92/50   Pulse 64   Ht _0  (1.676 m)   Wt 208 lb 6.4 oz (94.5 kg)   BMI 33.64 kg/m?   ?Wt Readings from Last 3 Encounters:  ?05/21/21 208 lb 6.4 oz (94.5 kg)  ?04/11/21 210 lb (95.3 kg)  ?01/21/21 206 lb 12.8 oz (93.8 kg)  ?  ? ?  Physical Exam- Limited ? ?Constitutional:  Body mass index is 33.64 kg/m?. , not in acute distress, normal state of mind ?Eyes:  EOMI, no exophthalmos ? ? ? ? ?  Latest Ref Rng & Units 12/16/2020  ?  8:04 AM 08/08/2020  ? 10:11 AM 04/29/2020  ?  9:08 AM  ?CMP  ?Glucose 70 - 99 mg/dL 169   147   89    ?BUN 8 - 27 mg/dL _0 ?Creatinine 0.57 - 1.00 mg/dL 0.93   1.09   0.92    ?Sodium 134 - 144 mmol/L 143   147   146    ?Potassium 3.5 - 5.2 mmol/L 4.1   4.5   4.1    ?Chloride 96 - 106 mmol/L 106   109   107    ?CO2 20 - 29 mmol/L 27   35   24    ?Calcium 8.7 - 10.3 mg/dL 9.5   9.6   9.5    ?Total Protein 6.0 - 8.5 g/dL 6.7   6.7   6.8    ?Total Bilirubin 0.0 - 1.2 mg/dL 0.3   0.2   0.5    ?Alkaline Phos 44 - 121 IU/L 81   73   77    ?AST 0 - 40 IU/L _1 ?ALT 0 - 32 IU/L _2 ? ? ?Lab Results   ?Component Value Date  ? HGBA1C 6.5 01/21/2021  ? HGBA1C 6.4 09/09/2020  ? HGBA1C 7.1 (A) 05/06/2020  ? ?Lipid Panel  ?   ?Component Value Date/Time  ? CHOL 203 (H) 12/16/2020 0804  ? TRIG 93 12/16/2020 0804  ? HDL 59 1

## 2021-05-26 ENCOUNTER — Ambulatory Visit (INDEPENDENT_AMBULATORY_CARE_PROVIDER_SITE_OTHER): Payer: Medicare PPO

## 2021-05-26 DIAGNOSIS — Z95810 Presence of automatic (implantable) cardiac defibrillator: Secondary | ICD-10-CM | POA: Diagnosis not present

## 2021-05-26 DIAGNOSIS — I5042 Chronic combined systolic (congestive) and diastolic (congestive) heart failure: Secondary | ICD-10-CM

## 2021-05-28 NOTE — Progress Notes (Signed)
EPIC Encounter for ICM Monitoring ? ?Patient Name: Diana Campbell is a 76 y.o. female ?Date: 05/28/2021 ?Primary Care Physican: Avis Epley, PA-C ?Primary Cardiologist: Tresa Endo ?Electrophysiologist: Croitoru ?05/28/2021 Weight: 205 lbs ?                                                         ?  ?Spoke with patient and heart failure questions reviewed.  Pt asymptomatic for fluid accumulation.  Reports feeling well at this time and voices no complaints.  ?  ?Optivol thoracic impedance suggesting close to normal fluid levels.  ?  ?Prescribed: No diuretic (Previously on Furosemide 40 mg 1 tablet as needed) ?Spironolactone 25 mg take 0.5 tablet (12.5 mg total) by mouth daily. ?  ?Labs: ?12/16/2020 Creatinine 0.93, BUN 16, Potassium 4.1, Sodium 143, GFR 63 ?08/08/2020 Creatinine 1.09, BUN 14, Potassium 4.5, Sodium 147, GFR 53 ?04/29/2020 Creatinine 0.92, BUN 14, Potassium 4.1, Sodium 146, GFR 65  ?04/03/2020 Creatinine 0.91, BUN 14, Potassium 4.4, Sodium 140, GFR 65  ?A complete set of results can be found in Results Review. ?  ?Recommendations:  No changes and encouraged to call if experiencing any fluid symptoms. ?  ?Follow-up plan: ICM clinic phone appointment on 06/30/2021.   91 day device clinic remote transmission 07/30/2021.   ?  ?EP/Cardiology Office Visits:  Recall 03/04/2021 with Dr Royann Shivers.   08/13/2021 with Dr Tresa Endo. ?  ?Copy of ICM check sent to Dr. Royann Shivers.  ? ?3 month ICM trend: 05/26/2021. ? ? ? ?12-14 Month ICM trend:  ? ? ? ?[name], RN ?05/28/2021 ?1:53 PM ? ?

## 2021-05-29 DIAGNOSIS — M1991 Primary osteoarthritis, unspecified site: Secondary | ICD-10-CM | POA: Diagnosis not present

## 2021-05-29 DIAGNOSIS — E6609 Other obesity due to excess calories: Secondary | ICD-10-CM | POA: Diagnosis not present

## 2021-05-29 DIAGNOSIS — G894 Chronic pain syndrome: Secondary | ICD-10-CM | POA: Diagnosis not present

## 2021-05-29 DIAGNOSIS — Z6832 Body mass index (BMI) 32.0-32.9, adult: Secondary | ICD-10-CM | POA: Diagnosis not present

## 2021-07-03 ENCOUNTER — Other Ambulatory Visit: Payer: Self-pay | Admitting: Cardiovascular Disease

## 2021-07-04 NOTE — Progress Notes (Signed)
No ICM remote transmission received for 06/30/2021 and next ICM transmission scheduled for 07/23/2021.

## 2021-07-07 ENCOUNTER — Telehealth: Payer: Self-pay

## 2021-07-07 NOTE — Telephone Encounter (Signed)
The patient will be receiving a new handheld in 7-10 business days.

## 2021-07-14 ENCOUNTER — Telehealth: Payer: Self-pay

## 2021-07-14 ENCOUNTER — Ambulatory Visit (INDEPENDENT_AMBULATORY_CARE_PROVIDER_SITE_OTHER): Payer: Medicare PPO

## 2021-07-14 DIAGNOSIS — Z95810 Presence of automatic (implantable) cardiac defibrillator: Secondary | ICD-10-CM | POA: Diagnosis not present

## 2021-07-14 DIAGNOSIS — I5042 Chronic combined systolic (congestive) and diastolic (congestive) heart failure: Secondary | ICD-10-CM | POA: Diagnosis not present

## 2021-07-14 NOTE — Telephone Encounter (Signed)
I helped the patient send a transmission. I answered all her questions.

## 2021-07-16 ENCOUNTER — Other Ambulatory Visit: Payer: Self-pay | Admitting: Cardiovascular Disease

## 2021-07-19 ENCOUNTER — Other Ambulatory Visit: Payer: Self-pay | Admitting: "Endocrinology

## 2021-07-24 DIAGNOSIS — E782 Mixed hyperlipidemia: Secondary | ICD-10-CM | POA: Diagnosis not present

## 2021-07-24 DIAGNOSIS — Z794 Long term (current) use of insulin: Secondary | ICD-10-CM | POA: Diagnosis not present

## 2021-07-24 DIAGNOSIS — Z79899 Other long term (current) drug therapy: Secondary | ICD-10-CM | POA: Diagnosis not present

## 2021-07-24 DIAGNOSIS — I10 Essential (primary) hypertension: Secondary | ICD-10-CM | POA: Diagnosis not present

## 2021-07-24 DIAGNOSIS — E119 Type 2 diabetes mellitus without complications: Secondary | ICD-10-CM | POA: Diagnosis not present

## 2021-07-24 DIAGNOSIS — I5042 Chronic combined systolic (congestive) and diastolic (congestive) heart failure: Secondary | ICD-10-CM | POA: Diagnosis not present

## 2021-07-25 LAB — COMPREHENSIVE METABOLIC PANEL
ALT: 5 IU/L (ref 0–32)
ALT: 7 IU/L (ref 0–32)
AST: 12 IU/L (ref 0–40)
AST: 11 IU/L (ref 0–40)
Albumin/Globulin Ratio: 1.5 (ref 1.2–2.2)
Albumin/Globulin Ratio: 1.7 (ref 1.2–2.2)
Albumin: 4 g/dL (ref 3.7–4.7)
Albumin: 4.1 g/dL (ref 3.7–4.7)
Alkaline Phosphatase: 92 IU/L (ref 44–121)
Alkaline Phosphatase: 91 IU/L (ref 44–121)
BUN/Creatinine Ratio: 10 — ABNORMAL LOW (ref 12–28)
BUN/Creatinine Ratio: 10 — ABNORMAL LOW (ref 12–28)
BUN: 11 mg/dL (ref 8–27)
BUN: 10 mg/dL (ref 8–27)
Bilirubin Total: 0.4 mg/dL (ref 0.0–1.2)
Bilirubin Total: 0.4 mg/dL (ref 0.0–1.2)
CO2: 21 mmol/L (ref 20–29)
CO2: 22 mmol/L (ref 20–29)
Calcium: 9.3 mg/dL (ref 8.7–10.3)
Calcium: 9.3 mg/dL (ref 8.7–10.3)
Chloride: 104 mmol/L (ref 96–106)
Chloride: 106 mmol/L (ref 96–106)
Creatinine, Ser: 1.05 mg/dL — ABNORMAL HIGH (ref 0.57–1.00)
Creatinine, Ser: 1.03 mg/dL — ABNORMAL HIGH (ref 0.57–1.00)
Globulin, Total: 2.6 g/dL (ref 1.5–4.5)
Globulin, Total: 2.4 g/dL (ref 1.5–4.5)
Glucose: 192 mg/dL — ABNORMAL HIGH (ref 70–99)
Glucose: 199 mg/dL — ABNORMAL HIGH (ref 70–99)
Potassium: 4.6 mmol/L (ref 3.5–5.2)
Potassium: 4.6 mmol/L (ref 3.5–5.2)
Sodium: 143 mmol/L (ref 134–144)
Sodium: 141 mmol/L (ref 134–144)
Total Protein: 6.6 g/dL (ref 6.0–8.5)
Total Protein: 6.5 g/dL (ref 6.0–8.5)
eGFR: 55 mL/min/{1.73_m2} — ABNORMAL LOW (ref >59)
eGFR: 56 mL/min/{1.73_m2} — ABNORMAL LOW (ref >59)

## 2021-07-25 LAB — LIPID PANEL
Chol/HDL Ratio: 2.6 ratio (ref 0.0–4.4)
Chol/HDL Ratio: 2.8 ratio (ref 0.0–4.4)
Cholesterol, Total: 140 mg/dL (ref 100–199)
Cholesterol, Total: 141 mg/dL (ref 100–199)
HDL: 53 mg/dL (ref >39)
HDL: 51 mg/dL (ref >39)
LDL Chol Calc (NIH): 74 mg/dL (ref 0–99)
LDL Chol Calc (NIH): 77 mg/dL (ref 0–99)
Triglycerides: 63 mg/dL (ref 0–149)
Triglycerides: 66 mg/dL (ref 0–149)
VLDL Cholesterol Cal: 13 mg/dL (ref 5–40)
VLDL Cholesterol Cal: 13 mg/dL (ref 5–40)

## 2021-07-25 LAB — CBC
Hematocrit: 41 % (ref 34.0–46.6)
Hemoglobin: 13.4 g/dL (ref 11.1–15.9)
MCH: 25.5 pg — ABNORMAL LOW (ref 26.6–33.0)
MCHC: 32.7 g/dL (ref 31.5–35.7)
MCV: 78 fL — ABNORMAL LOW (ref 79–97)
Platelets: 275 10*3/uL (ref 150–450)
RBC: 5.25 x10E6/uL (ref 3.77–5.28)
RDW: 13.8 % (ref 11.7–15.4)
WBC: 8.1 10*3/uL (ref 3.4–10.8)

## 2021-07-25 LAB — TSH: TSH: 0.879 u[IU]/mL (ref 0.450–4.500)

## 2021-07-25 LAB — T4, FREE: Free T4: 1.1 ng/dL (ref 0.82–1.77)

## 2021-07-25 LAB — PRO B NATRIURETIC PEPTIDE: NT-Pro BNP: 292 pg/mL (ref 0–738)

## 2021-07-30 ENCOUNTER — Ambulatory Visit (INDEPENDENT_AMBULATORY_CARE_PROVIDER_SITE_OTHER): Payer: Medicare PPO

## 2021-07-30 DIAGNOSIS — I428 Other cardiomyopathies: Secondary | ICD-10-CM | POA: Diagnosis not present

## 2021-07-30 LAB — CUP PACEART REMOTE DEVICE CHECK
Battery Remaining Longevity: 68 mo
Battery Voltage: 2.98 V
Brady Statistic AP VP Percent: 0.04 %
Brady Statistic AP VS Percent: 97.41 %
Brady Statistic AS VP Percent: 0 %
Brady Statistic AS VS Percent: 2.55 %
Brady Statistic RA Percent Paced: 96.81 %
Brady Statistic RV Percent Paced: 0.04 %
Date Time Interrogation Session: 20230712033623
HighPow Impedance: 45 Ohm
HighPow Impedance: 51 Ohm
Implantable Lead Implant Date: 20120702
Implantable Lead Implant Date: 20120702
Implantable Lead Location: 753859
Implantable Lead Location: 753860
Implantable Lead Model: 5076
Implantable Lead Model: 6947
Implantable Pulse Generator Implant Date: 20191011
Lead Channel Impedance Value: 380 Ohm
Lead Channel Impedance Value: 456 Ohm
Lead Channel Impedance Value: 456 Ohm
Lead Channel Pacing Threshold Amplitude: 0.75 V
Lead Channel Pacing Threshold Amplitude: 0.875 V
Lead Channel Pacing Threshold Pulse Width: 0.4 ms
Lead Channel Pacing Threshold Pulse Width: 0.4 ms
Lead Channel Sensing Intrinsic Amplitude: 11.25 mV
Lead Channel Sensing Intrinsic Amplitude: 11.25 mV
Lead Channel Sensing Intrinsic Amplitude: 2.375 mV
Lead Channel Sensing Intrinsic Amplitude: 2.375 mV
Lead Channel Setting Pacing Amplitude: 1.75 V
Lead Channel Setting Pacing Amplitude: 2 V
Lead Channel Setting Pacing Pulse Width: 0.4 ms
Lead Channel Setting Sensing Sensitivity: 0.3 mV

## 2021-08-05 ENCOUNTER — Other Ambulatory Visit: Payer: Self-pay | Admitting: Cardiovascular Disease

## 2021-08-05 ENCOUNTER — Other Ambulatory Visit: Payer: Self-pay | Admitting: "Endocrinology

## 2021-08-05 DIAGNOSIS — E118 Type 2 diabetes mellitus with unspecified complications: Secondary | ICD-10-CM

## 2021-08-06 LAB — CUP PACEART REMOTE DEVICE CHECK
Battery Remaining Longevity: 67 mo
Battery Voltage: 2.98 V
Brady Statistic AP VP Percent: 0.03 %
Brady Statistic AP VS Percent: 96.96 %
Brady Statistic AS VP Percent: 0.01 %
Brady Statistic AS VS Percent: 3.01 %
Brady Statistic RA Percent Paced: 96.19 %
Brady Statistic RV Percent Paced: 0.04 %
Date Time Interrogation Session: 20230715075648
HighPow Impedance: 44 Ohm
HighPow Impedance: 52 Ohm
Implantable Lead Implant Date: 20120702
Implantable Lead Implant Date: 20120702
Implantable Lead Location: 753859
Implantable Lead Location: 753860
Implantable Lead Model: 5076
Implantable Lead Model: 6947
Implantable Pulse Generator Implant Date: 20191011
Lead Channel Impedance Value: 380 Ohm
Lead Channel Impedance Value: 456 Ohm
Lead Channel Impedance Value: 494 Ohm
Lead Channel Pacing Threshold Amplitude: 0.75 V
Lead Channel Pacing Threshold Amplitude: 0.875 V
Lead Channel Pacing Threshold Pulse Width: 0.4 ms
Lead Channel Pacing Threshold Pulse Width: 0.4 ms
Lead Channel Sensing Intrinsic Amplitude: 11.375 mV
Lead Channel Sensing Intrinsic Amplitude: 11.375 mV
Lead Channel Sensing Intrinsic Amplitude: 2 mV
Lead Channel Sensing Intrinsic Amplitude: 2 mV
Lead Channel Setting Pacing Amplitude: 1.75 V
Lead Channel Setting Pacing Amplitude: 2 V
Lead Channel Setting Pacing Pulse Width: 0.4 ms
Lead Channel Setting Sensing Sensitivity: 0.3 mV

## 2021-08-11 DIAGNOSIS — Z794 Long term (current) use of insulin: Secondary | ICD-10-CM | POA: Diagnosis not present

## 2021-08-11 DIAGNOSIS — E119 Type 2 diabetes mellitus without complications: Secondary | ICD-10-CM | POA: Diagnosis not present

## 2021-08-11 DIAGNOSIS — Z961 Presence of intraocular lens: Secondary | ICD-10-CM | POA: Diagnosis not present

## 2021-08-11 DIAGNOSIS — Z7984 Long term (current) use of oral hypoglycemic drugs: Secondary | ICD-10-CM | POA: Diagnosis not present

## 2021-08-11 DIAGNOSIS — H524 Presbyopia: Secondary | ICD-10-CM | POA: Diagnosis not present

## 2021-08-11 LAB — HM DIABETES EYE EXAM

## 2021-08-13 ENCOUNTER — Ambulatory Visit: Payer: Medicare PPO | Admitting: Cardiovascular Disease

## 2021-08-13 VITALS — BP 106/62 | HR 67 | Ht 66.0 in | Wt 209.0 lb

## 2021-08-13 DIAGNOSIS — E119 Type 2 diabetes mellitus without complications: Secondary | ICD-10-CM | POA: Diagnosis not present

## 2021-08-13 DIAGNOSIS — Z720 Tobacco use: Secondary | ICD-10-CM | POA: Diagnosis not present

## 2021-08-13 DIAGNOSIS — I5042 Chronic combined systolic (congestive) and diastolic (congestive) heart failure: Secondary | ICD-10-CM

## 2021-08-13 DIAGNOSIS — I10 Essential (primary) hypertension: Secondary | ICD-10-CM

## 2021-08-13 DIAGNOSIS — E669 Obesity, unspecified: Secondary | ICD-10-CM

## 2021-08-13 DIAGNOSIS — Z95810 Presence of automatic (implantable) cardiac defibrillator: Secondary | ICD-10-CM | POA: Diagnosis not present

## 2021-08-13 DIAGNOSIS — I428 Other cardiomyopathies: Secondary | ICD-10-CM

## 2021-08-13 NOTE — Patient Instructions (Signed)
Medication Instructions:  The current medical regimen is effective;  continue present plan and medications.  *If you need a refill on your cardiac medications before your next appointment, please call your pharmacy*   Follow-Up: At [facility], you and your health needs are our priority.  As part of our continuing mission to provide you with exceptional heart care, we have created designated Provider Care Teams.  These Care Teams include your primary Cardiologist (physician) and Advanced Practice Providers (APPs -  Physician Assistants and Nurse Practitioners) who all work together to provide you with the care you need, when you need it.  We recommend signing up for the patient portal called "MyChart".  Sign up information is provided on this After Visit Summary.  MyChart is used to connect with patients for Virtual Visits (Telemedicine).  Patients are able to view lab/test results, encounter notes, upcoming appointments, etc.  Non-urgent messages can be sent to your provider as well.   To learn more about what you can do with MyChart, go to [email or web address].    Your next appointment:   6 month(s)  The format for your next appointment:   In Person  Provider:   Thomas Kelly, MD    

## 2021-08-13 NOTE — Progress Notes (Signed)
Patient ID: Diana Campbell, female   DOB: January 09, 1946, 76 y.o.   MRN: [id number]     HPI: Diana Campbell is a 76 y.o. female who presents to the office for a 4 month  follow-up cardiology evaluation.       Diana Campbell suffered a ventricular fibrillation cardiac arrest in June 2005 at which time she was successfully resuscitated. She was found to have a nonischemic cardiomyopathy;  initial ejection fraction 15%. Catheterization revealed 30-40% intermediate stenosis. At that time, she underwent single-chamber Medtronic ICD implantation and initially had a spring fidelis lead. In July 2012 she underwent complete revision of her system.  An echo Doppler study in July 2013 showed an ejection fraction now at 45-50% with mild global hypokinesis. She did have mild MR, mild TR, and mild aortic sclerosis.  Additional problems include type 2 diabetes mellitus on insulin, hypertension, hyperlipidemia, and probable untreated obstructive sleep apnea.  In the past she has had some mild edema.  She has been followed by Dr. Sallyanne Kuster for her defibrillator.  In September 2018  ICD interrogation showed normal device function, normal lead parameters, no episodes of ventricular tachycardia and she was atrially pacing 86% of the time.  There was no ventricular pacing.  She had a favorable heart rate histogram and thoracic impedance was fairly stable but at times she had some evidence for fluid overload.  When I last saw her in March 2019, she denied any episodes of chest pain.  She was experiencing mild shortness of breath particularly when she bends over and also admitted to some mild ankle swelling.  She has chronic low back pain and for this reason she does not exercise.  Since it had been 6 years since her last echo Doppler study I recommended she undergo a follow-up echocardiographic evaluation.  He echo Doppler study showed an EF of 40 to 45% with mild LV dilation and diffuse hypokinesis.  There is no significant  valvular abnormalities.  She underwent elective generator change out for her ICD in October 2019.  She has an abandoned lead and unfortunately cannot have MRIs even though she has an MRI conditional system.  She was hospitalized January 2020 with sepsis secondary to UTI.  She has continued to be followed by Dr. Sallyanne Kuster for the next.  She was evaluated by Kerin Ransom in 2020, [place], [place] she has continued to see Dr. Sallyanne Kuster.  She has been transitioned to [facility] and has been treated with carvedilol and loop diuretic.  She was seen by Harrington Challenger, RPH-CPP most recently in March 2022.  At that time, the patient wanted to be off Entresto but further discussion was made and the patient was willing to continue [facility] for another month.  She felt the [name] was causing her blood pressure to go up but apparently her diet was poor and she was eating cookies and soda for breakfast and continue to eat hotdogs and food high in sodium most likely contributing to her blood pressure elevation.  I saw her in July 2022 after not having seen her since March 2019.  She had stopped her Entresto.  She denied she subsequently stopped her Entresto.  She denied chest pain ,PND or orthopnea.  When last seen by Dr. Sallyanne Kuster there was some discussion about possible SGLT2 inhibition.  During 1, I recommended a retrial of Entresto 24/26 mg twice a day.  I also recommended initiation of Jardiance 10 mg.    I  saw her on November 22, 2020.  At time she was tolerating Entresto 24/26 mg twice a day.  I recommended further titration of her Entresto to 49/51 mg twice a day.  She denied any chest pain.  I did her with samples of Jardiance 10 mg for SGLT 2 inhibition and recommended follow-up pharmacy evaluation.  I also recommended a follow-up echo Doppler study prior to her next office visit.  On March 21, 2021 she underwent her follow-up echo Doppler study which showed an EF of 30 to 35% with global hypokinesis.  There was  grade 1 diastolic dysfunction, aortic valve sclerosis without stenosis, and mild dilation of left atrium.  I last saw her on April 11, 2021 at which time she denied any chest pain.  She was experiencing some mild shortness of breath particularly with walking to the mailbox.  At times she also notes trace swelling in her ankles and she takes furosemide every 3 to 4 days on a as needed basis.  Presently her medical regimen consists of Entresto 49/51 mg twice a day, Jardiance 10 mg daily, carvedilol 6.25 mg twice a day, and she was to have increased to atorvastatin to 20 mg but apparently she is only taking 10 mg.  She continues to undergo ICD remote defibrillator checks sees Dr. Sallyanne Kuster.  During that evaluation, I recommended the addition of spironolactone 12.5 mg to her current regimen I recommended follow-up laboratory in several weeks with potential future titration of Entresto to maximum dosing.  Since her prior evaluation, she continues to feel well.  However she has issues with low back discomfort.  She is followed by Delman Cheadle, PA-C at [facility] in [place].  Apparently she has been using snuff since age 2 and dips 3 times per day.  She continues to be on carvedilol 12.5 mg twice a day, Entresto 49/51 mg twice a day, Jardiance 10 mg, and spironolactone 12.5 mg.  She is on atorvastatin 20 mg for lipid therapy and is on insulin for her diabetes.  She presents for reevaluation.  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   CARDIAC CATHETERIZATION  04/14/2000   Normal coronary arteries.   CARDIAC DEFIBRILLATOR PLACEMENT  07/21/2010   Medtronic Protecta XT DR, model#[id number], serial#[id number]   CARDIOVASCULAR STRESS TEST  04/05/2000   Scintigraphic evidence of mild LV dilatation and global LV dysfunction with an EF 41% with mild inferolateral ischemia.   CARPAL TUNNEL RELEASE Right 01/23/2016   Procedure: CARPAL TUNNEL RELEASE;  Surgeon: Carole Civil, MD;  Location: [facility];  Service: Orthopedics;  Laterality: Right;  pt knows to arrive at 6:15   COLONOSCOPY  03/30/2011   Procedure: COLONOSCOPY;  Surgeon: Jamesetta So, MD;  Location: [facility];  Service: Gastroenterology;  Laterality: N/A;   DORSAL COMPARTMENT RELEASE Right 08/11/2013   Procedure: RELEASE DORSAL COMPARTMENT (DEQUERVAIN);  Surgeon: Carole Civil, MD;  Location: [facility];  Service: Orthopedics;  Laterality: Right;   ICD GENERATOR CHANGEOUT N/A 10/29/2017  Procedure: ICD GENERATOR CHANGEOUT;  Surgeon: Sanda Klein, MD;  Location: [place] [facility];  Service: Cardiovascular;  Laterality: N/A;   TRANSTHORACIC ECHOCARDIOGRAM  08/03/2011   EF 45-50%, mildly reduced LV systolic function.    No Known Allergies  Current Outpatient Medications  Medication Sig Dispense Refill   ACCU-CHEK FASTCLIX LANCETS MISC 1 each by Does not apply route 2 (two) times daily. 200 each 5   ACCU-CHEK GUIDE test strip USE AS INSTRUCTED TO TEST BLOOD GLUCOSE FOUR TIMES DAILY 400 strip 1   acetaminophen (TYLENOL) 325 MG tablet Take 650 mg by mouth in the morning and at bedtime.     aspirin EC 81 MG tablet Take 1 tablet (81 mg total) by mouth daily. 90 tablet 3   atorvastatin (LIPITOR) 20 MG tablet Take 1 tablet (20 mg total) by mouth daily. 90 tablet 1   Blood Glucose Monitoring Suppl (ACCU-CHEK GUIDE) w/Device KIT Use as directed to check blood glucose four times daily  1 kit 0   carvedilol (COREG) 12.5 MG tablet TAKE 1/2 TABLET BY MOUTH TWICE A DAY WITH A MEAL 90 tablet 1   HUMALOG MIX 75/25 KWIKPEN (75-25) 100 UNIT/ML KwikPen INJECT 20 UNITS INTO THE SKIN 2 (TWO) TIMES DAILY. 15 mL 1   JARDIANCE 10 MG TABS tablet TAKE 1 TABLET BY MOUTH EVERY DAY 90 tablet 3   Lancets Misc. (ACCU-CHEK FASTCLIX LANCET) KIT 1 each by Does not apply route 2 (two) times daily. 1 kit 2   sacubitril-valsartan (ENTRESTO) 49-51 MG TAKE 1 TABLET BY MOUTH TWICE A DAY 60 tablet 8   spironolactone (ALDACTONE) 25 MG tablet Take 0.5 tablets (12.5 mg total) by mouth daily. 90 tablet 3   No current facility-administered medications for this visit.    Social History   Socioeconomic History   Marital status: Married    Spouse name: Not on file   Number of children: Not on file   Years of education: Not on file   Highest education level: Not on file  Occupational History   Not on file  Tobacco Use   Smoking status: Never   Smokeless tobacco: Current    Types: Snuff   Tobacco comments:    3 dips per day  Vaping Use   Vaping Use: Never used  Substance and Sexual Activity   Alcohol use: No   Drug use: No   Sexual activity: Yes    Birth control/protection: None  Other Topics Concern   Not on file  Social History Narrative   Not on file   Social Determinants of Health   Financial Resource Strain: Not on file  Food Insecurity: Not on file  Transportation Needs: Not on file  Physical Activity: Not on file  Stress: Not on file  Social Connections: Not on file  Intimate Partner Violence: Not on file   Socially she is married and has 4 children and several grandchildren.  Parents are deceased  ROS General: Negative; No fevers, chills, or night sweats; positive for fatigue HEENT: Negative; No changes in vision or hearing, sinus congestion, difficulty swallowing Pulmonary: Negative; No cough, wheezing, shortness of breath, hemoptysis Cardiovascular: see HPI GI: Negative;  No nausea, vomiting, diarrhea, or abdominal pain GU: Negative; No dysuria, hematuria, or difficulty voiding Musculoskeletal: Low back discomfort Hematologic/Oncology: Negative; no easy bruising, bleeding Endocrine: Positive for diabetes mellitus Neuro: Negative; no changes in balance, headaches Skin: Negative; No rashes or skin lesions Psychiatric: Negative; No behavioral problems, depression Sleep: Probable untreated sleep apnea with history  of snoring, fatigue, frequent awakenings.  She did not undergo her recommended sleep study. Other comprehensive 14 point system review is negative.   PE BP 106/62 (BP Location: Left Arm, Patient Position: Sitting, Cuff Size: Normal)   Pulse 67   Ht _0  (1.676 m)   Wt 209 lb (94.8 kg)   BMI 33.73 kg/m    Repeat blood pressure by me 110/68  Wt Readings from Last 3 Encounters:  08/13/21 209 lb (94.8 kg)  05/21/21 208 lb 6.4 oz (94.5 kg)  04/11/21 210 lb (95.3 kg)   General: Alert, oriented, no distress.  Skin: normal turgor, no rashes, warm and dry HEENT: Normocephalic, atraumatic. Pupils equal round and reactive to light; sclera anicteric; extraocular muscles intact;  Nose without nasal septal hypertrophy Mouth/Parynx benign; Mallinpatti scale 3 Neck: No JVD, no carotid bruits; normal carotid upstroke Lungs: clear to ausculatation and percussion; no wheezing or rales Chest wall: without tenderness to palpitation Heart: PMI not displaced, RRR, s1 s2 normal, 1/6 systolic murmur, no diastolic murmur, no rubs, gallops, thrills, or heaves Abdomen: soft, nontender; no hepatosplenomehaly, BS+; abdominal aorta nontender and not dilated by palpation. Back: no CVA tenderness Pulses 2+ Musculoskeletal: full range of motion, normal strength, no joint deformities Extremities: no clubbing cyanosis or edema, Homan's sign negative  Neurologic: grossly nonfocal; Cranial nerves grossly wnl Psychologic: Normal mood and affect    August 13, 2021 ECG  (independently read by me): Atrial paced, PR 214 msec,    April 11, 2021 ECG (independently read by me): 100% atrial pacing.  Isolated PVC.  PR interval 208 ms  November 29, 2020 ECG (independently read by me): Atrial paced at 62; prolonged AV conduction PR 212 msec   July 25, 2020 ECG (independently read by me): Atrial paced at 62  March 2019 ECG (independently read by me): Telemetry paced rhythm at 63 bpm.  PR interval 206 ms.  March 2017 ECG (independently read by me): Atrially paced rhythm at 61 bpm.  December 2014 ECG: Atrial lead paced rhythm at 79 beats per minute. PRWP.  Isolated PVC.  LABS:     Latest Ref Rng & Units 07/24/2021    8:18 AM 07/24/2021    8:15 AM 12/16/2020    8:04 AM  BMP  Glucose 70 - 99 mg/dL 192  199  169   BUN 8 - 27 mg/dL _1 Creatinine 0.57 - 1.00 mg/dL 1.05  1.03  0.93   BUN/Creat Ratio 12 - _2 Sodium 134 - 144 mmol/L 143  141  143   Potassium 3.5 - 5.2 mmol/L 4.6  4.6  4.1   Chloride 96 - 106 mmol/L 104  106  106   CO2 20 - 29 mmol/L _3 Calcium 8.7 - 10.3 mg/dL 9.3  9.3  9.5       Latest Ref Rng & Units 07/24/2021    8:18 AM 07/24/2021    8:15 AM 12/16/2020    8:04 AM  Hepatic Function  Total Protein 6.0 - 8.5 g/dL 6.6  6.5  6.7   Albumin 3.7 - 4.7 g/dL 4.0  4.1  4.0   AST 0 - 40 IU/L _4 ALT 0 - 32 IU/L _5 Alk Phosphatase 44 - 121 IU/L 92  91  81   Total Bilirubin 0.0 - 1.2 mg/dL 0.4  0.4  0.3       Latest Ref Rng & Units 07/24/2021    8:18 AM 12/16/2020    8:04 AM 01/31/2018    6:53 AM  CBC  WBC 3.4 - 10.8 x10E3/uL 8.1  8.4  14.3   Hemoglobin 11.1 - 15.9 g/dL 13.4  13.4  11.5   Hematocrit 34.0 - 46.6 % 41.0  41.8  36.3   Platelets 150 - 450 x10E3/uL 275  286  310    Lab Results  Component Value Date   MCV 78 (L) 07/24/2021   MCV 80 12/16/2020   MCV 77.2 (L) 01/31/2018   Lab Results  Component Value Date   TSH 0.879 07/24/2021   Lab Results  Component Value Date   HGBA1C 6.5  01/21/2021   Lipid Panel     Component Value Date/Time   CHOL 140 07/24/2021 0818   TRIG 63 07/24/2021 0818   HDL 53 07/24/2021 0818   CHOLHDL 2.6 07/24/2021 0818   CHOLHDL 3.5 08/28/2019 0851   VLDL 14 09/26/2015 0856   LDLCALC 74 07/24/2021 0818   LDLCALC 141 (H) 08/28/2019 0851     RADIOLOGY: No results found.  IMPRESSION:  1. Chronic combined systolic and diastolic CHF (congestive heart failure) (H[facility])   2. Cardiomyopathy, nonischemic ([place])   3. Essential hypertension   4. Mild obesity   5. ICD (implantable cardioverter-defibrillator) in place   6. Controlled type 2 diabetes mellitus without complication, without long-term current use of insulin ([place])   7. Snuff user      ASSESSMENT AND PLAN: Ms. Marijo Quizon is a 76 year old African-American female who suffered a ventricular fibrillation cardiac arrest in June 2005 at which time she was found to have an ejection fraction of 15% and felt to have a nonischemic cardiomyopathy. She has a Nutritional therapist XT DR defibrillator in place. An echo Doppler study  in July 2013 showed an ejection fraction of 45-50%.  She had been on lisinopril, as needed hydrochlorothiazide in the past.  An echo Doppler study in March 2019 showed an EF of 40 to 45% with mild LV dilation and diffuse hypokinesis.  She did not have significant valvular abnormalities.  In October 2019 she had a generator change out.  There was an option for future CRT upgrade if clinical status deteriorates.  She ultimately was transition to [place]. When seen in March 2022 by Elon Alas.D. she was on Entresto 49/51 mg twice a day, carvedilol 6.25 mg twice a day, furosemide 40 mg daily in addition to atorvastatin 20 mg, Humalog insulin.  When I saw her in July 2022 she had stopped her Entresto.  At that time her blood pressure was stable and I recommended reinstitution at 24/26 mg twice a day and I subsequent visit I recommended titration of Entresto back to 49/51 mg  twice a day and added Jardiance for SGLT2 in addition.  She continues to be on carvedilol 6.25 mg twice a day.  She had been on atorvastatin 10 mg and it was recommended that she further titrate her dose to 20 mg following her LDL cholesterol at 127 in November 2022.   Her most recent echo Doppler study from March 21, 2021 continues to show LV dysfunction with a EF at 30 to 35% with global hypocontractility.  She had grade 1 diastolic dysfunction, aortic valve sclerosis, and mild LA dilation.  Presently, she remains fairly asymptomatic with stable blood pressure at 110/68 on her current regimen of carvedilol 6.25 mg twice a day, [employer]  49/51 mg twice a day, Jardiance 10 mg, and spironolactone 12.5 mg.  She is on insulin for diabetes and on atorvastatin 20 mg for hyperlipidemia.  With her current blood pressure I will not further titrate Entresto to maximum dose presently.  Apparently, she uses snuff regularly and has been doing this since age 49, currently 3 dips of snuff per day.  I reviewed recent laboratory from July 24, 2021.  Glucose was elevated at 192.  Renal function was stable with creatinine 1.05.  LFTs were normal.  She was not anemic with hemoglobin 13.4 but had microcytic indices with MCV at 78.  LDL cholesterol was 74 with total cholesterol 140, triglycerides 63.  TSH was normal at 0.88.  NT proBNP was normal at 292 on current therapy.  I will see her in 6 months for reevaluation.   Troy Sine, MD, [facility]  08/16/2021 5:35 PM

## 2021-08-14 DIAGNOSIS — Z0001 Encounter for general adult medical examination with abnormal findings: Secondary | ICD-10-CM | POA: Diagnosis not present

## 2021-08-14 DIAGNOSIS — Z1331 Encounter for screening for depression: Secondary | ICD-10-CM | POA: Diagnosis not present

## 2021-08-14 DIAGNOSIS — R5383 Other fatigue: Secondary | ICD-10-CM | POA: Diagnosis not present

## 2021-08-14 DIAGNOSIS — E6609 Other obesity due to excess calories: Secondary | ICD-10-CM | POA: Diagnosis not present

## 2021-08-14 DIAGNOSIS — M1991 Primary osteoarthritis, unspecified site: Secondary | ICD-10-CM | POA: Diagnosis not present

## 2021-08-14 DIAGNOSIS — Z6832 Body mass index (BMI) 32.0-32.9, adult: Secondary | ICD-10-CM | POA: Diagnosis not present

## 2021-08-16 ENCOUNTER — Encounter: Payer: Self-pay | Admitting: Cardiovascular Disease

## 2021-08-18 ENCOUNTER — Ambulatory Visit (INDEPENDENT_AMBULATORY_CARE_PROVIDER_SITE_OTHER): Payer: Medicare PPO

## 2021-08-18 DIAGNOSIS — Z95810 Presence of automatic (implantable) cardiac defibrillator: Secondary | ICD-10-CM | POA: Diagnosis not present

## 2021-08-18 DIAGNOSIS — I5042 Chronic combined systolic (congestive) and diastolic (congestive) heart failure: Secondary | ICD-10-CM

## 2021-08-18 NOTE — Progress Notes (Signed)
Remote ICD transmission.   

## 2021-08-20 ENCOUNTER — Telehealth: Payer: Self-pay

## 2021-08-20 NOTE — Telephone Encounter (Signed)
Remote ICM transmission received.  Attempted call to patient regarding ICM remote transmission and left detailed message per DPR.  Advised to return call for any fluid symptoms or questions. Next ICM remote transmission scheduled 09/23/2021.    

## 2021-08-20 NOTE — Progress Notes (Signed)
EPIC Encounter for ICM Monitoring  Patient Name: Diana Campbell is a 76 y.o. female Date: 08/20/2021 Primary Care Physican: Ladon Applebaum Primary Cardiologist: Tresa Endo Electrophysiologist: Croitoru 05/28/2021 Weight: 205 lbs 07/14/2021 Weight: 205 lbs                                                            Attempted call to patient and unable to reach.  Left detailed message per DPR regarding transmission. Transmission reviewed.    Optivol thoracic impedance suggesting normal fluid levels.    Prescribed: No diuretic (Previously on Furosemide 40 mg 1 tablet as needed) Spironolactone 25 mg take 0.5 tablet (12.5 mg total) by mouth daily.   Labs: 07/24/2021 Creatine 1.05, BUN 11, Potassium 4.6, Sodium 143, GFR 55 A complete set of results can be found in Results Review.   Recommendations:  Left voice mail with ICM number and encouraged to call if experiencing any fluid symptoms.   Follow-up plan: ICM clinic phone appointment on 09/23/2021.   91 day device clinic remote transmission 10/29/2021.     EP/Cardiology Office Visits:  Recall 03/04/2021 with Dr Royann Shivers.   Recall 02/09/2022 with Dr Tresa Endo.   Copy of ICM check sent to Dr. Royann Shivers.    3 month ICM trend: 08/18/2021.    12-14 Month ICM trend:     [name], RN 08/20/2021 3:27 PM

## 2021-09-06 ENCOUNTER — Other Ambulatory Visit: Payer: Self-pay | Admitting: Cardiovascular Disease

## 2021-09-15 ENCOUNTER — Telehealth: Payer: Self-pay | Admitting: "Endocrinology

## 2021-09-15 DIAGNOSIS — E119 Type 2 diabetes mellitus without complications: Secondary | ICD-10-CM

## 2021-09-15 MED ORDER — INSULIN LISPRO PROT & LISPRO (75-25 MIX) 100 UNIT/ML KWIKPEN: PEN_INJECTOR | SUBCUTANEOUS | 0 refills | Status: DC

## 2021-09-15 NOTE — Telephone Encounter (Signed)
Rx sent 

## 2021-09-15 NOTE — Telephone Encounter (Signed)
Pt said she needs a refill on her Humalog sent to [facility] on [address]

## 2021-09-23 ENCOUNTER — Ambulatory Visit (INDEPENDENT_AMBULATORY_CARE_PROVIDER_SITE_OTHER): Payer: Medicare PPO

## 2021-09-23 DIAGNOSIS — Z95810 Presence of automatic (implantable) cardiac defibrillator: Secondary | ICD-10-CM

## 2021-09-23 DIAGNOSIS — I5042 Chronic combined systolic (congestive) and diastolic (congestive) heart failure: Secondary | ICD-10-CM | POA: Diagnosis not present

## 2021-09-24 NOTE — Progress Notes (Signed)
EPIC Encounter for ICM Monitoring  Patient Name: Diana Campbell is a 76 y.o. female Date: 09/24/2021 Primary Care Physican: Ladon Applebaum Primary Cardiologist: Tresa Endo Electrophysiologist: Croitoru 05/28/2021 Weight: 205 lbs 07/14/2021 Weight: 205 lbs                                                            Transmission reviewed.    Optivol thoracic impedance suggesting normal fluid levels.    Prescribed: No diuretic (Previously on Furosemide 40 mg 1 tablet as needed) Spironolactone 25 mg take 0.5 tablet (12.5 mg total) by mouth daily.   Labs: 07/24/2021 Creatine 1.05, BUN 11, Potassium 4.6, Sodium 143, GFR 55 A complete set of results can be found in Results Review.   Recommendations:  No changes.   Follow-up plan: ICM clinic phone appointment on 10/31/2021.   91 day device clinic remote transmission 10/29/2021.     EP/Cardiology Office Visits:  Recall 03/04/2021 with Dr Royann Shivers.   Recall 02/09/2022 with Dr Tresa Endo.   Copy of ICM check sent to Dr. Royann Shivers.    3 month ICM trend: 09/23/2021.    12-14 Month ICM trend:     [name], RN 09/24/2021 1:53 PM

## 2021-09-29 DIAGNOSIS — E782 Mixed hyperlipidemia: Secondary | ICD-10-CM | POA: Diagnosis not present

## 2021-09-29 DIAGNOSIS — Z0001 Encounter for general adult medical examination with abnormal findings: Secondary | ICD-10-CM | POA: Diagnosis not present

## 2021-09-29 DIAGNOSIS — M1991 Primary osteoarthritis, unspecified site: Secondary | ICD-10-CM | POA: Diagnosis not present

## 2021-09-29 DIAGNOSIS — R7309 Other abnormal glucose: Secondary | ICD-10-CM | POA: Diagnosis not present

## 2021-10-01 LAB — BASIC METABOLIC PANEL
BUN: 16 (ref 4–21)
CO2: 19 (ref 13–22)
Chloride: 109 — AB (ref 99–108)
Creatinine: 1 (ref 0.5–1.1)
Glucose: 170
Potassium: 4.7 mEq/L (ref 3.5–5.1)
Sodium: 147 (ref 137–147)

## 2021-10-01 LAB — COMPREHENSIVE METABOLIC PANEL
Albumin: 4.1 (ref 3.5–5.0)
Calcium: 9.6 (ref 8.7–10.7)
Globulin: 2.7

## 2021-10-01 LAB — LIPID PANEL
Cholesterol: 148 (ref 0–200)
HDL: 52 (ref 35–70)
LDL Cholesterol: 82
Triglycerides: 74 (ref 40–160)

## 2021-10-01 LAB — HEPATIC FUNCTION PANEL
ALT: 6 U/L — AB (ref 7–35)
AST: 13 (ref 13–35)
Alkaline Phosphatase: 93 (ref 25–125)
Bilirubin, Total: 0.3

## 2021-10-01 LAB — HEMOGLOBIN A1C: Hemoglobin A1C: 7.3

## 2021-10-01 LAB — TSH: TSH: 0.84 (ref 0.41–5.90)

## 2021-10-28 DIAGNOSIS — G894 Chronic pain syndrome: Secondary | ICD-10-CM | POA: Diagnosis not present

## 2021-10-28 DIAGNOSIS — M5432 Sciatica, left side: Secondary | ICD-10-CM | POA: Diagnosis not present

## 2021-10-28 DIAGNOSIS — Z6832 Body mass index (BMI) 32.0-32.9, adult: Secondary | ICD-10-CM | POA: Diagnosis not present

## 2021-10-28 DIAGNOSIS — M1991 Primary osteoarthritis, unspecified site: Secondary | ICD-10-CM | POA: Diagnosis not present

## 2021-10-29 ENCOUNTER — Ambulatory Visit (INDEPENDENT_AMBULATORY_CARE_PROVIDER_SITE_OTHER): Payer: Medicare PPO

## 2021-10-29 DIAGNOSIS — I428 Other cardiomyopathies: Secondary | ICD-10-CM | POA: Diagnosis not present

## 2021-10-29 LAB — CUP PACEART REMOTE DEVICE CHECK
Battery Remaining Longevity: 63 mo
Battery Voltage: 2.98 V
Brady Statistic AP VP Percent: 0.14 %
Brady Statistic AP VS Percent: 92.76 %
Brady Statistic AS VP Percent: 0 %
Brady Statistic AS VS Percent: 7.1 %
Brady Statistic RA Percent Paced: 89.13 %
Brady Statistic RV Percent Paced: 0.15 %
Date Time Interrogation Session: 20231011001803
HighPow Impedance: 46 Ohm
HighPow Impedance: 62 Ohm
Implantable Lead Implant Date: 20120702
Implantable Lead Implant Date: 20120702
Implantable Lead Location: 753859
Implantable Lead Location: 753860
Implantable Lead Model: 5076
Implantable Lead Model: 6947
Implantable Pulse Generator Implant Date: 20191011
Lead Channel Impedance Value: 437 Ohm
Lead Channel Impedance Value: 494 Ohm
Lead Channel Impedance Value: 494 Ohm
Lead Channel Pacing Threshold Amplitude: 0.875 V
Lead Channel Pacing Threshold Amplitude: 0.875 V
Lead Channel Pacing Threshold Pulse Width: 0.4 ms
Lead Channel Pacing Threshold Pulse Width: 0.4 ms
Lead Channel Sensing Intrinsic Amplitude: 2.125 mV
Lead Channel Sensing Intrinsic Amplitude: 2.125 mV
Lead Channel Sensing Intrinsic Amplitude: 9.375 mV
Lead Channel Sensing Intrinsic Amplitude: 9.375 mV
Lead Channel Setting Pacing Amplitude: 1.75 V
Lead Channel Setting Pacing Amplitude: 2 V
Lead Channel Setting Pacing Pulse Width: 0.4 ms
Lead Channel Setting Sensing Sensitivity: 0.3 mV

## 2021-11-04 ENCOUNTER — Ambulatory Visit (INDEPENDENT_AMBULATORY_CARE_PROVIDER_SITE_OTHER): Payer: Medicare PPO

## 2021-11-04 ENCOUNTER — Telehealth: Payer: Self-pay

## 2021-11-04 DIAGNOSIS — Z95810 Presence of automatic (implantable) cardiac defibrillator: Secondary | ICD-10-CM

## 2021-11-04 DIAGNOSIS — I5042 Chronic combined systolic (congestive) and diastolic (congestive) heart failure: Secondary | ICD-10-CM

## 2021-11-04 NOTE — Telephone Encounter (Signed)
Remote ICM transmission received.  Attempted call to patient regarding ICM remote transmission and no answer.  

## 2021-11-04 NOTE — Progress Notes (Signed)
EPIC Encounter for ICM Monitoring  Patient Name: Diana Campbell is a 76 y.o. female Date: 11/04/2021 Primary Care Physican: Scherrie Bateman Primary Cardiologist: Claiborne Billings Electrophysiologist: Croitoru 05/28/2021 Weight: 205 lbs 07/14/2021 Weight: 205 lbs                                                            Attempted call to patient and unable to reach.   Transmission reviewed.    Optivol thoracic impedance suggesting possible fluid accumulation starting 10/2 but trending close to baseline.    Prescribed: No diuretic (Previously on Furosemide 40 mg 1 tablet as needed) Spironolactone 25 mg take 0.5 tablet (12.5 mg total) by mouth daily.   Labs: 07/24/2021 Creatine 1.05, BUN 11, Potassium 4.6, Sodium 143, GFR 55 A complete set of results can be found in Results Review.   Recommendations:  Unable to reach.     Follow-up plan: ICM clinic phone appointment on 12/08/2021.   91 day device clinic remote transmission 01/28/2022.     EP/Cardiology Office Visits:  Recall 03/04/2021 with Dr Sallyanne Kuster.   Recall 02/09/2022 with Dr Claiborne Billings.   Copy of ICM check sent to Dr. Sallyanne Kuster.    3 month ICM trend: 10//16/2023.    12-14 Month ICM trend:     [name], RN 11/04/2021 8:08 AM

## 2021-11-11 NOTE — Progress Notes (Signed)
Remote ICD transmission.   

## 2021-11-26 ENCOUNTER — Ambulatory Visit: Payer: Medicare PPO | Admitting: "Endocrinology

## 2021-12-08 ENCOUNTER — Ambulatory Visit (INDEPENDENT_AMBULATORY_CARE_PROVIDER_SITE_OTHER): Payer: Medicare PPO

## 2021-12-08 DIAGNOSIS — Z95810 Presence of automatic (implantable) cardiac defibrillator: Secondary | ICD-10-CM | POA: Diagnosis not present

## 2021-12-08 DIAGNOSIS — I5042 Chronic combined systolic (congestive) and diastolic (congestive) heart failure: Secondary | ICD-10-CM | POA: Diagnosis not present

## 2021-12-10 NOTE — Progress Notes (Signed)
Thanks for all the downloads in the last couple of days and Happy Thanksgiving!

## 2021-12-10 NOTE — Progress Notes (Signed)
EPIC Encounter for ICM Monitoring  Patient Name: KAENA SANTORI is a 76 y.o. female Date: 12/10/2021 Primary Care Physican: Ladon Applebaum Primary Cardiologist: Tresa Endo Electrophysiologist: Croitoru 05/28/2021 Weight: 205 lbs 07/14/2021 Weight: 205 lbs                                                            Transmission reviewed.    Optivol thoracic impedance suggesting normal fluid levels.    Prescribed: No diuretic (Previously on Furosemide 40 mg 1 tablet as needed) Spironolactone 25 mg take 0.5 tablet (12.5 mg total) by mouth daily.   Labs: 07/24/2021 Creatine 1.05, BUN 11, Potassium 4.6, Sodium 143, GFR 55 A complete set of results can be found in Results Review.   Recommendations:  No changes.   Follow-up plan: ICM clinic phone appointment on 01/26/2022.   91 day device clinic remote transmission 01/28/2022.     EP/Cardiology Office Visits:  Recall 03/04/2021 with Dr Royann Shivers.   Recall 02/09/2022 with Dr Tresa Endo.   Copy of ICM check sent to Dr. Royann Shivers.     3 month ICM trend: 12/08/2021.    12-14 Month ICM trend:     [name], RN 12/10/2021 12:05 PM

## 2021-12-17 ENCOUNTER — Other Ambulatory Visit: Payer: Self-pay

## 2021-12-17 DIAGNOSIS — E782 Mixed hyperlipidemia: Secondary | ICD-10-CM

## 2021-12-17 DIAGNOSIS — E119 Type 2 diabetes mellitus without complications: Secondary | ICD-10-CM

## 2021-12-22 DIAGNOSIS — E119 Type 2 diabetes mellitus without complications: Secondary | ICD-10-CM | POA: Diagnosis not present

## 2021-12-22 DIAGNOSIS — E782 Mixed hyperlipidemia: Secondary | ICD-10-CM | POA: Diagnosis not present

## 2021-12-22 DIAGNOSIS — Z794 Long term (current) use of insulin: Secondary | ICD-10-CM | POA: Diagnosis not present

## 2021-12-23 LAB — COMPREHENSIVE METABOLIC PANEL
ALT: 6 IU/L (ref 0–32)
AST: 13 IU/L (ref 0–40)
Albumin/Globulin Ratio: 1.4 (ref 1.2–2.2)
Albumin: 3.8 g/dL (ref 3.8–4.8)
Alkaline Phosphatase: 84 IU/L (ref 44–121)
BUN/Creatinine Ratio: 14 (ref 12–28)
BUN: 13 mg/dL (ref 8–27)
Bilirubin Total: 0.5 mg/dL (ref 0.0–1.2)
CO2: 24 mmol/L (ref 20–29)
Calcium: 9.5 mg/dL (ref 8.7–10.3)
Chloride: 106 mmol/L (ref 96–106)
Creatinine, Ser: 0.93 mg/dL (ref 0.57–1.00)
Globulin, Total: 2.7 g/dL (ref 1.5–4.5)
Glucose: 121 mg/dL — ABNORMAL HIGH (ref 70–99)
Potassium: 3.9 mmol/L (ref 3.5–5.2)
Sodium: 143 mmol/L (ref 134–144)
Total Protein: 6.5 g/dL (ref 6.0–8.5)
eGFR: 63 mL/min/{1.73_m2} (ref >59)

## 2021-12-23 LAB — LIPID PANEL
Chol/HDL Ratio: 2.5 ratio (ref 0.0–4.4)
Cholesterol, Total: 132 mg/dL (ref 100–199)
HDL: 52 mg/dL (ref >39)
LDL Chol Calc (NIH): 68 mg/dL (ref 0–99)
Triglycerides: 54 mg/dL (ref 0–149)
VLDL Cholesterol Cal: 12 mg/dL (ref 5–40)

## 2021-12-23 LAB — T4, FREE: Free T4: 1.1 ng/dL (ref 0.82–1.77)

## 2021-12-23 LAB — TSH: TSH: 1.19 u[IU]/mL (ref 0.450–4.500)

## 2022-01-01 ENCOUNTER — Encounter: Payer: Self-pay | Admitting: "Endocrinology

## 2022-01-01 ENCOUNTER — Ambulatory Visit: Payer: Medicare PPO | Admitting: "Endocrinology

## 2022-01-01 VITALS — BP 118/62 | HR 64 | Ht 66.0 in | Wt 203.2 lb

## 2022-01-01 DIAGNOSIS — I10 Essential (primary) hypertension: Secondary | ICD-10-CM

## 2022-01-01 DIAGNOSIS — E119 Type 2 diabetes mellitus without complications: Secondary | ICD-10-CM | POA: Diagnosis not present

## 2022-01-01 DIAGNOSIS — E782 Mixed hyperlipidemia: Secondary | ICD-10-CM

## 2022-01-01 DIAGNOSIS — Z6832 Body mass index (BMI) 32.0-32.9, adult: Secondary | ICD-10-CM

## 2022-01-01 DIAGNOSIS — Z794 Long term (current) use of insulin: Secondary | ICD-10-CM | POA: Diagnosis not present

## 2022-01-01 DIAGNOSIS — E6609 Other obesity due to excess calories: Secondary | ICD-10-CM

## 2022-01-01 LAB — POCT GLYCOSYLATED HEMOGLOBIN (HGB A1C): HbA1c, POC (controlled diabetic range): 7 % (ref 0.0–7.0)

## 2022-01-01 NOTE — Progress Notes (Signed)
05/21/2021  Endocrinology follow-up note   Subjective:    Patient ID: Diana Campbell, female    DOB: July 02, 1945, PCP Jake Samples, PA-C   Past Medical History:  Diagnosis Date   Arthritis    Automatic implantable cardioverter-defibrillator in situ 07/2010   Cardiac abnormality    with defibrilator   Chronic combined systolic and diastolic CHF (congestive heart failure) ([place]) 09/04/2015   Diabetes mellitus    H/O cardiac arrest 2006   Hypercholesteremia    Hypertension    Neuropathy    Past Surgical History:  Procedure Laterality Date   CARDIAC CATHETERIZATION  06/22/2003   Severe nonischemic cardiomyopathy, 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   CARDIAC CATHETERIZATION  04/14/2000   Normal coronary arteries.   CARDIAC DEFIBRILLATOR PLACEMENT  07/21/2010   Medtronic Protecta XT DR, model#[id number], serial#[id number]   CARDIOVASCULAR STRESS TEST  04/05/2000   Scintigraphic evidence of mild LV dilatation and global LV dysfunction with an EF 41% with mild inferolateral ischemia.   CARPAL TUNNEL RELEASE Right 01/23/2016   Procedure: CARPAL TUNNEL RELEASE;  Surgeon: Carole Civil, MD;  Location: [facility];  Service: Orthopedics;  Laterality: Right;  pt knows to arrive at 6:15   COLONOSCOPY  03/30/2011   Procedure: COLONOSCOPY;  Surgeon: Jamesetta So, MD;  Location: [facility];  Service: Gastroenterology;  Laterality: N/A;   DORSAL COMPARTMENT RELEASE Right 08/11/2013   Procedure: RELEASE DORSAL COMPARTMENT (DEQUERVAIN);  Surgeon: Carole Civil, MD;  Location: [facility];  Service: Orthopedics;  Laterality: Right;   ICD GENERATOR CHANGEOUT N/A 10/29/2017   Procedure: ICD GENERATOR CHANGEOUT;  Surgeon: Sanda Klein, MD;  Location: [facility];  Service: Cardiovascular;  Laterality: N/A;   TRANSTHORACIC ECHOCARDIOGRAM  08/03/2011   EF 45-50%, mildly reduced LV systolic function.    Social History   Socioeconomic History   Marital status: Married    Spouse name: Not on file   Number of children: Not on file   Years of education: Not on file   Highest education level: Not on file  Occupational History   Not on file  Tobacco Use   Smoking status: Never   Smokeless tobacco: Current    Types: Snuff   Tobacco comments:    3 dips per day  Vaping Use   Vaping Use: Never used  Substance and Sexual Activity   Alcohol use: No   Drug use: No   Sexual activity: Yes    Birth control/protection: None  Other Topics Concern   Not on file  Social History Narrative   Not on file   Social Determinants of Health   Financial Resource Strain: Not on file  Food Insecurity: Not on file  Transportation Needs: Not on file  Physical Activity: Not on file  Stress: Not on file  Social Connections: Not on file   Outpatient Encounter Medications as of 05/21/2021  Medication Sig   ACCU-CHEK FASTCLIX LANCETS MISC 1 each by Does not apply route 2 (two) times daily.   ACCU-CHEK GUIDE test strip USE AS INSTRUCTED TO TEST BLOOD GLUCOSE FOUR TIMES DAILY   acetaminophen (TYLENOL) 325 MG tablet Take 650 mg by mouth in the morning and at bedtime.   aspirin EC 81 MG tablet Take 1 tablet (81 mg total) by mouth daily.   atorvastatin (LIPITOR) 20 MG tablet Take 1 tablet (20 mg total) by mouth daily.   Blood Glucose Monitoring Suppl (ACCU-CHEK  GUIDE) w/Device KIT Use as directed to check blood glucose four times daily   carvedilol (COREG) 12.5 MG tablet TAKE 1/2 TABLET BY MOUTH TWICE A DAY WITH A MEAL   empagliflozin (JARDIANCE) 10 MG TABS tablet Take 1 tablet (10 mg total) by mouth daily.   HUMALOG MIX 75/25 KWIKPEN (75-25) 100 UNIT/ML KwikPen INJECT 20 UNITS INTO THE SKIN 2 (TWO) TIMES DAILY.   Lancets Misc. (ACCU-CHEK FASTCLIX LANCET) KIT 1 each by Does not apply route 2 (two) times daily.   sacubitril-valsartan (ENTRESTO) 49-51 MG Take 1 tablet by mouth 2 (two) times daily.    spironolactone (ALDACTONE) 25 MG tablet Take 0.5 tablets (12.5 mg total) by mouth daily.   No facility-administered encounter medications on file as of 05/21/2021.   ALLERGIES: No Known Allergies VACCINATION STATUS: Immunization History  Administered Date(s) Administered   Influenza-Unspecified 11/19/2017    Diabetes She is returns for follow-up in the management of her currently uncontrolled type 2 diabetes, hyperlipidemia, hypertension.   Onset time: She was diagnosed with diabetes at approximately 76 years of age.  She is responding to her current insulin regimen.   She presents with controlled glycemic profile, averaging between 125-155 milligrams per DL over the last 30 days.  Her point-of-care A1c is 6.4%, progressively improving.    No major hypoglycemia is documented or reported.    She has done relatively better on premixed insulin twice a day.  She is currently on Humalog 75/25 20 units twice daily with breakfast and supper.  She tolerates this medication very well along with her Jardiance 10 mg p.o. daily at breakfast.  Jardiance was started by her cardiologist.   Pertinent negatives for hypoglycemia include no confusion, headaches, pallor or seizures. There are no diabetic associated symptoms. Pertinent negatives for diabetes include no chest pain, no polydipsia, no polyphagia and no polyuria. There are no hypoglycemic complications. Symptoms are worsening. Diabetic complications include nephropathy. Risk factors for coronary artery disease include diabetes mellitus, dyslipidemia, hypertension, sedentary lifestyle and tobacco exposure. Current diabetic treatment includes premixed insulin twice daily associated with monitoring of blood glucose at least 2 times a day.   She did not document any major hypoglycemia. Her weight is stable, she complains of poor appetite. She is following a generally unhealthy diet, regularly eats "junk food".  She has not had a previous visit with a  dietitian (She declined a referral to CDE.)  She has uncontrolled dyslipidemia despite Lipitor 10 mg p.o. daily.  An ACE inhibitor/angiotensin II receptor blocker is being taken. Eye exam is current.    Hyperlipidemia This is a chronic problem. The current episode started more than 1 year ago. Pertinent negatives include no chest pain, myalgias or shortness of breath. Current antihyperlipidemic treatment includes statins. Risk factors for coronary artery disease include dyslipidemia, diabetes mellitus and a sedentary lifestyle.  Her previsit labs show LDL at 127, despite Lipitor 10 mg p.o. daily. Hypertension This is a chronic problem. The current episode started more than 1 year ago. The problem is controlled. Pertinent negatives include no chest pain, headaches, palpitations or shortness of breath. Past treatments include ACE inhibitors. Hypertensive end-organ damage includes kidney disease.    Review of systems: Limited as above.   Objective:    BP (!) 92/50   Pulse 64   Ht _0  (1.676 m)   Wt 208 lb 6.4 oz (94.5 kg)   BMI 33.64 kg/m   Wt Readings from Last 3 Encounters:  05/21/21 208 lb 6.4 oz (94.5 kg)  04/11/21 210 lb (95.3 kg)  01/21/21 206 lb 12.8 oz (93.8 kg)     Physical Exam- Limited  Constitutional:  Body mass index is 33.64 kg/m. , not in acute distress, normal state of mind Eyes:  EOMI, no exophthalmos       Latest Ref Rng & Units 12/16/2020    8:04 AM 08/08/2020   10:11 AM 04/29/2020    9:08 AM  CMP  Glucose 70 - 99 mg/dL 169   147   89    BUN 8 - 27 mg/dL _0 Creatinine 0.57 - 1.00 mg/dL 0.93   1.09   0.92    Sodium 134 - 144 mmol/L 143   147   146    Potassium 3.5 - 5.2 mmol/L 4.1   4.5   4.1    Chloride 96 - 106 mmol/L 106   109   107    CO2 20 - 29 mmol/L 27   35   24    Calcium 8.7 - 10.3 mg/dL 9.5   9.6   9.5    Total Protein 6.0 - 8.5 g/dL 6.7   6.7   6.8    Total Bilirubin 0.0 - 1.2 mg/dL 0.3   0.2   0.5    Alkaline Phos 44 - 121  IU/L 81   73   77    AST 0 - 40 IU/L _1 ALT 0 - 32 IU/L _2 Lab Results  Component Value Date   HGBA1C 6.5 01/21/2021   HGBA1C 6.4 09/09/2020   HGBA1C 7.1 (A) 05/06/2020   Lipid Panel     Component Value Date/Time   CHOL 203 (H) 12/16/2020 0804   TRIG 93 12/16/2020 0804   HDL 59 12/16/2020 0804   CHOLHDL 3.4 12/16/2020 0804   CHOLHDL 3.5 08/28/2019 0851   VLDL 14 09/26/2015 0856   LDLCALC 127 (H) 12/16/2020 0804   LDLCALC 141 (H) 08/28/2019 0851     Assessment & Plan:   1. Type 2 diabetes mellitus with long-term current use of insulin (HCC)  -Her diabetes is complicated by cardiomyopathy and she remains at a high risk for more acute and chronic complications of diabetes which include CAD, CVA, CKD, retinopathy, and neuropathy. These are all discussed in detail with the patient.  Patient presents with controlled glycemic profile and point-of-care A1c of 7%.  She did not document any hypoglycemia.   Recent labs reviewed.   - I have re-counseled the patient on diet management and weight loss  by adopting a carbohydrate restricted / protein rich  Diet.  - she acknowledges that there is a room for improvement in her food and drink choices. - Suggestion is made for her to avoid simple carbohydrates  from her diet including Cakes, Sweet Desserts, Ice Cream, Soda (diet and regular), Sweet Tea, Candies, Chips, Cookies, Store Bought Juices, Alcohol , Artificial Sweeteners,  Coffee Creamer, and "Sugar-free" Products, Lemonade. This will help patient to have more stable blood glucose profile and potentially avoid unintended weight gain.  The following Lifestyle Medicine recommendations according to [facility]) were discussed and and offered to patient and she  agrees to start the journey:  A. Whole Foods, Plant-Based Nutrition comprising of fruits and vegetables, plant-based proteins, whole-grain carbohydrates was discussed in  detail with the patient.  A list for source of those nutrients were also provided to the patient.  Patient will use only water or unsweetened tea for hydration. B.  The need to stay away from risky substances including alcohol, smoking; obtaining 7 to 9 hours of restorative sleep, at least 150 minutes of moderate intensity exercise weekly, the importance of healthy social connections,  and stress management techniques were discussed. C.  A full color page of  Calorie density of various food groups per pound showing examples of each food groups was provided to the patient.   - Patient is advised to stick to a routine mealtimes to eat 3 meals a day and avoid unnecessary snacks specially with ultra processed foods.    - The patient  declined referral to a  CDE for individualized DM education.  - I have approached patient with the following individualized plan to manage diabetes and patient agrees.   - Patient  worries a lot about hypoglycemia. -She has done fairly well on premixed insulin.  She is advised to continue Humalog 75/25 20 units with breakfast and 20 units with supper  for P[place]al blood glucose readings above 90 mg per DL.  She is advised to continue Jardiance 10 mg p.o. daily at breakfast, prescribed by her cardiologist.   -She is advised and agrees to continue monitoring of blood glucose 4 times a day-before meals and at bedtime.    -Patient is encouraged to call clinic for blood glucose levels less than 70 or above 200 mg /dl.  -Her labs show her renal function is improving. She would have benefited from metformin however she reports intolerance to metformin.   - Patient specific target  for A1c; LDL, HDL, Triglycerides,were discussed in detail.  2) BP/HTN:  -Her blood pressure is controlled to target.  She is currently on carvedilol 12.5 mg p.o. twice daily, spironolactone 25 mg p.o. once a day.   3) Lipids/HPL: Recent lipid panel showed loss of control her LDL at 127.  She is  advised to continue Lipitor 20 mg nightly.    She will have repeat labs before her next visit.    The above detailed plan predominant lifestyle medicine nutrition will help with dyslipidemia besides type 2 diabetes.    4)  Weight/Diet: Her BMI 32.8-complicating her diabetes care.  She is a candidate for modest weight loss.    She declined CDE consult . exercise, and carbohydrates information provided.  5) Chronic Care/Health Maintenance:  -Patient is on ACEI/ARB and Statin medications and encouraged to continue to follow up with Ophthalmology, Podiatrist at least yearly or according to recommendations, and advised to  stay away from smoking. I have recommended yearly flu vaccine and pneumonia vaccination at least every 5 years; moderate intensity exercise for up to 150 minutes weekly; and  sleep for at least 7 hours a day.   - I advised patient to maintain close follow up with Jake Samples, PA-C for primary care needs.  I spent 26 minutes in the care of the patient today including review of labs from [facility], Lipids, Thyroid Function, Hematology (current and previous including abstractions from other facilities); face-to-face time discussing  her blood glucose readings/logs, discussing hypoglycemia and hyperglycemia episodes and symptoms, medications doses, her options of short and long term treatment based on the latest standards of care / guidelines;  discussion about incorporating lifestyle medicine;  and documenting the encounter. Risk reduction counseling performed per USPSTF guidelines to reduce  obesity and cardiovascular risk factors.  Please refer to Patient Instructions for Blood Glucose Monitoring and Insulin/Medications Dosing Guide"  in media tab for additional information. Please  also refer to " Patient Self Inventory" in the Media  tab for reviewed elements of pertinent patient history.  Yaffa Golda Acre participated in the discussions, expressed understanding, and voiced  agreement with the above plans.  All questions were answered to her satisfaction. she is encouraged to contact clinic should she have any questions or concerns prior to her return visit.   Follow up plan: -Return in about 6 months (around 11/21/2021) for F/U with Pre-visit Labs, Meter/CGM/Logs, A1c here.  Glade Lloyd, MD Phone: [phone]  Fax: [phone]  -  This note was partially dictated with voice recognition software. Similar sounding words can be transcribed inadequately or may not  be corrected upon review.  05/21/2021, 2:23 PM

## 2022-01-01 NOTE — Patient Instructions (Signed)

## 2022-01-18 ENCOUNTER — Other Ambulatory Visit: Payer: Self-pay | Admitting: "Endocrinology

## 2022-01-18 ENCOUNTER — Other Ambulatory Visit: Payer: Self-pay | Admitting: Cardiovascular Disease

## 2022-01-18 DIAGNOSIS — Z794 Long term (current) use of insulin: Secondary | ICD-10-CM

## 2022-01-18 DIAGNOSIS — E118 Type 2 diabetes mellitus with unspecified complications: Secondary | ICD-10-CM

## 2022-01-22 ENCOUNTER — Telehealth: Payer: Self-pay

## 2022-01-22 ENCOUNTER — Other Ambulatory Visit: Payer: Self-pay | Admitting: "Endocrinology

## 2022-01-22 DIAGNOSIS — E785 Hyperlipidemia, unspecified: Secondary | ICD-10-CM | POA: Diagnosis not present

## 2022-01-22 DIAGNOSIS — G8929 Other chronic pain: Secondary | ICD-10-CM | POA: Diagnosis not present

## 2022-01-22 DIAGNOSIS — I509 Heart failure, unspecified: Secondary | ICD-10-CM | POA: Diagnosis not present

## 2022-01-22 DIAGNOSIS — F17220 Nicotine dependence, chewing tobacco, uncomplicated: Secondary | ICD-10-CM | POA: Diagnosis not present

## 2022-01-22 DIAGNOSIS — E119 Type 2 diabetes mellitus without complications: Secondary | ICD-10-CM

## 2022-01-22 DIAGNOSIS — Z6832 Body mass index (BMI) 32.0-32.9, adult: Secondary | ICD-10-CM | POA: Diagnosis not present

## 2022-01-22 DIAGNOSIS — E669 Obesity, unspecified: Secondary | ICD-10-CM | POA: Diagnosis not present

## 2022-01-22 DIAGNOSIS — R2681 Unsteadiness on feet: Secondary | ICD-10-CM | POA: Diagnosis not present

## 2022-01-22 NOTE — Telephone Encounter (Signed)
Left a message requesting pt return call to the office. ?

## 2022-01-26 ENCOUNTER — Ambulatory Visit (INDEPENDENT_AMBULATORY_CARE_PROVIDER_SITE_OTHER): Payer: Medicare PPO

## 2022-01-26 DIAGNOSIS — I5042 Chronic combined systolic (congestive) and diastolic (congestive) heart failure: Secondary | ICD-10-CM

## 2022-01-26 DIAGNOSIS — Z95810 Presence of automatic (implantable) cardiac defibrillator: Secondary | ICD-10-CM

## 2022-01-28 ENCOUNTER — Ambulatory Visit (INDEPENDENT_AMBULATORY_CARE_PROVIDER_SITE_OTHER): Payer: Medicare PPO

## 2022-01-28 DIAGNOSIS — I428 Other cardiomyopathies: Secondary | ICD-10-CM

## 2022-01-28 LAB — CUP PACEART REMOTE DEVICE CHECK
Battery Remaining Longevity: 57 mo
Battery Voltage: 2.97 V
Brady Statistic AP VP Percent: 0.1 %
Brady Statistic AP VS Percent: 88.9 %
Brady Statistic AS VP Percent: 0.01 %
Brady Statistic AS VS Percent: 10.99 %
Brady Statistic RA Percent Paced: 85.31 %
Brady Statistic RV Percent Paced: 0.11 %
Date Time Interrogation Session: 20240110012404
HighPow Impedance: 45 Ohm
HighPow Impedance: 58 Ohm
Implantable Lead Connection Status: 753985
Implantable Lead Connection Status: 753985
Implantable Lead Implant Date: 20120702
Implantable Lead Implant Date: 20120702
Implantable Lead Location: 753859
Implantable Lead Location: 753860
Implantable Lead Model: 5076
Implantable Lead Model: 6947
Implantable Pulse Generator Implant Date: 20191011
Lead Channel Impedance Value: 399 Ohm
Lead Channel Impedance Value: 494 Ohm
Lead Channel Impedance Value: 494 Ohm
Lead Channel Pacing Threshold Amplitude: 0.875 V
Lead Channel Pacing Threshold Amplitude: 0.875 V
Lead Channel Pacing Threshold Pulse Width: 0.4 ms
Lead Channel Pacing Threshold Pulse Width: 0.4 ms
Lead Channel Sensing Intrinsic Amplitude: 10.125 mV
Lead Channel Sensing Intrinsic Amplitude: 10.125 mV
Lead Channel Sensing Intrinsic Amplitude: 2.375 mV
Lead Channel Sensing Intrinsic Amplitude: 2.375 mV
Lead Channel Setting Pacing Amplitude: 1.75 V
Lead Channel Setting Pacing Amplitude: 2 V
Lead Channel Setting Pacing Pulse Width: 0.4 ms
Lead Channel Setting Sensing Sensitivity: 0.3 mV
Zone Setting Status: 755011
Zone Setting Status: 755011

## 2022-01-29 NOTE — Progress Notes (Signed)
EPIC Encounter for ICM Monitoring  Patient Name: Diana Campbell is a 77 y.o. female Date: 01/29/2022 Primary Care Physican: Scherrie Bateman Primary Cardiologist: Claiborne Billings Electrophysiologist: Croitoru 05/28/2021 Weight: 205 lbs 07/14/2021 Weight: 205 lbs                                                            Spoke with patient and heart failure questions reviewed.  Transmission results reviewed.  Pt asymptomatic for fluid accumulation.  Reports feeling well at this time and voices no complaints.     Optivol thoracic impedance suggesting normal fluid levels.    Prescribed: No diuretic (Previously on Furosemide 40 mg 1 tablet as needed) Spironolactone 25 mg take 0.5 tablet (12.5 mg total) by mouth daily.   Labs: 07/24/2021 Creatine 1.05, BUN 11, Potassium 4.6, Sodium 143, GFR 55 A complete set of results can be found in Results Review.   Recommendations: No changes and encouraged to call if experiencing any fluid symptoms.  Follow-up plan: ICM clinic phone appointment on 03/02/2022.   91 day device clinic remote transmission 04/29/2022.     EP/Cardiology Office Visits:  Recall 03/04/2021 with Dr Sallyanne Kuster.   Recall 02/09/2022 with Dr Claiborne Billings.  Advised to call Dr Croitoru's office to make overdue appt.     Copy of ICM check sent to Dr. Sallyanne Kuster.     3 month ICM trend: 01/28/2022.    12-14 Month ICM trend:     [name], RN 01/29/2022 2:21 PM

## 2022-02-18 NOTE — Progress Notes (Signed)
Remote ICD transmission.   

## 2022-02-24 ENCOUNTER — Other Ambulatory Visit: Payer: Self-pay | Admitting: Family Medicine

## 2022-02-24 DIAGNOSIS — E2839 Other primary ovarian failure: Secondary | ICD-10-CM

## 2022-03-02 ENCOUNTER — Ambulatory Visit: Payer: Medicare PPO | Attending: Cardiovascular Disease

## 2022-03-02 DIAGNOSIS — I5042 Chronic combined systolic (congestive) and diastolic (congestive) heart failure: Secondary | ICD-10-CM

## 2022-03-02 DIAGNOSIS — Z95810 Presence of automatic (implantable) cardiac defibrillator: Secondary | ICD-10-CM

## 2022-03-09 NOTE — Progress Notes (Signed)
EPIC Encounter for ICM Monitoring  Patient Name: Diana Campbell is a 77 y.o. female Date: 03/09/2022 Primary Care Physican: Scherrie Bateman Primary Cardiologist: Claiborne Campbell Electrophysiologist: Croitoru 05/28/2021 Weight: 205 lbs 01/01/2022 Office Weight: 203 lbs                                                            Transmission reviewed.      Optivol thoracic impedance suggesting normal fluid levels.    Prescribed: No diuretic (Previously on Furosemide 40 mg 1 tablet as needed) Spironolactone 25 mg take 0.5 tablet (12.5 mg total) by mouth daily.   Labs: 12/22/2021 Creatinine 0.93, BUN 13, Potassium 3.9, Sodium 143 10/01/2021 Creatinine 1.0, BUN 16, Potassium 4.7, Sodium 147  07/24/2021 Creatine 1.05, BUN 11, Potassium 4.6, Sodium 143, GFR 55 A complete set of results can be found in Results Review.   Recommendations: No changes.   Follow-up plan: ICM clinic phone appointment on 04/06/2022.   91 day device clinic remote transmission 04/29/2022.     EP/Cardiology Office Visits:  03/23/2022 with Dr Sallyanne Kuster.   Recall 02/09/2022 with Dr Claiborne Campbell.     Copy of ICM check sent to Dr. Sallyanne Kuster.     3 month ICM trend: 03/02/2022.    12-14 Month ICM trend:     Diana Billings, RN 03/09/2022 10:43 AM

## 2022-03-16 ENCOUNTER — Other Ambulatory Visit: Payer: Self-pay | Admitting: "Endocrinology

## 2022-03-16 ENCOUNTER — Other Ambulatory Visit: Payer: Self-pay | Admitting: Cardiovascular Disease

## 2022-03-16 DIAGNOSIS — Z794 Long term (current) use of insulin: Secondary | ICD-10-CM

## 2022-03-23 ENCOUNTER — Encounter: Payer: Self-pay | Admitting: Cardiovascular Disease

## 2022-03-23 ENCOUNTER — Ambulatory Visit: Payer: Medicare PPO | Attending: Cardiovascular Disease | Admitting: Cardiovascular Disease

## 2022-03-23 VITALS — BP 101/51 | HR 65 | Ht 65.0 in | Wt 199.8 lb

## 2022-03-23 DIAGNOSIS — I480 Paroxysmal atrial fibrillation: Secondary | ICD-10-CM

## 2022-03-23 DIAGNOSIS — I5042 Chronic combined systolic (congestive) and diastolic (congestive) heart failure: Secondary | ICD-10-CM

## 2022-03-23 DIAGNOSIS — E1121 Type 2 diabetes mellitus with diabetic nephropathy: Secondary | ICD-10-CM | POA: Diagnosis not present

## 2022-03-23 DIAGNOSIS — I428 Other cardiomyopathies: Secondary | ICD-10-CM

## 2022-03-23 DIAGNOSIS — Z95810 Presence of automatic (implantable) cardiac defibrillator: Secondary | ICD-10-CM | POA: Diagnosis not present

## 2022-03-23 DIAGNOSIS — E782 Mixed hyperlipidemia: Secondary | ICD-10-CM

## 2022-03-23 NOTE — Progress Notes (Signed)
Patient ID: BRE PECINA, female   DOB: 12-20-1945, 77 y.o.   MRN: [id number]    Cardiology Office Note    Date:  03/28/2022   ID:  Diana, Campbell 11-02-1945, MRN [id number]  PCP:  Jake Samples, PA-C  Cardiologist:  Shelva Majestic, M.D. Sanda Klein, MD   Chief Complaint  Patient presents with   Congestive Heart Failure   ICD check    History of Present Illness:  Diana Campbell is a 77 y.o. female a remote history of resuscitative ventricular fibrillation arrest in June 2005, in the setting of nonischemic cardiomyopathy with left ventricular ejection fraction that has improved from 15% at diagnosis to around 45% on medical therapy, now 30-35% by most recent evaluation (echo March 2023). She has minor coronary atherosclerosis by previous angiography. She had a  Medtronic defibrillator/Sprint Fidelis lead, subsequently had a complete revision of her system with placement of a new right ventricular lead and dual-chamber generator in July 2012.  The Sprint Fidelis lead was not extracted (abandoned lead) so her device is not MRI conditional.  Underwent elective generator change out in October 2019.  It has been a long time since she has had an episode of heart failure exacerbation that has required more than a slight adjustment of her diuretics.  She has not been hospitalized since January 9798 (complicated E. faecalis urinary tract infection).    Generally feels well, but is recovering from a respiratory illness.  Planning to have multiple teeth extracted.  Overall NYHA functional class II-3A (sometimes gets short of breath bending over or walking in her driveway, but able to take care of usual activities without limitations).  Denies lower extremity edema, chest pain, orthopnea, PND, syncope, palpitations, focal logical deficits, claudication, unexplained weight gain.  She is enrolled in the heart failure device monthly monitoring.  OptiVol is in normal range.  Comprehensive  device check today shows normal function.  She has a Medtronic E vera MRI device, but the device is not amenable to MRI due to the abandoned old defibrillator lead.  She has 89% atrial pacing and does not require ventricular pacing.  Estimated generator longevity is 4.3 years.  She has had a couple of very brief episodes of nonsustained VT, max 9 beats.  The device has recorded very short episodes of atrial fibrillation.  The overall cumulative burden of atrial fibrillation is only 11 minutes over the last year.  Most recent  AF event was a  1 minute episode in August 2023, none since.  Additional problems include obstructive sleep apnea, diabetes mellitus on insulin, hypertension and hyperlipidemia.  Most recent hemoglobin A1c was 7.0% Dec 2023. LDL was 68.  Past Medical History:  Diagnosis Date   Arthritis    Automatic implantable cardioverter-defibrillator in situ 07/2010   Cardiac abnormality    with defibrilator   Chronic combined systolic and diastolic CHF (congestive heart failure) ([place]) 09/04/2015   Diabetes mellitus    H/O cardiac arrest 2006   Hypercholesteremia    Hypertension    Neuropathy     Past Surgical History:  Procedure Laterality Date   CARDIAC CATHETERIZATION  06/22/2003   Severe nonischemic cardiomyopathy, 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   CARDIAC CATHETERIZATION  04/14/2000   Normal coronary arteries.   CARDIAC DEFIBRILLATOR PLACEMENT  07/21/2010   Medtronic Protecta XT DR, model#[id number], serial#[id number]   CARDIOVASCULAR STRESS TEST  04/05/2000   Scintigraphic evidence of  mild LV dilatation and global LV dysfunction with an EF 41% with mild inferolateral ischemia.   CARPAL TUNNEL RELEASE Right 01/23/2016   Procedure: CARPAL TUNNEL RELEASE;  Surgeon: Carole Civil, MD;  Location: [facility];  Service: Orthopedics;  Laterality: Right;  pt knows to arrive at 6:15   COLONOSCOPY   03/30/2011   Procedure: COLONOSCOPY;  Surgeon: Jamesetta So, MD;  Location: [facility];  Service: Gastroenterology;  Laterality: N/A;   DORSAL COMPARTMENT RELEASE Right 08/11/2013   Procedure: RELEASE DORSAL COMPARTMENT (DEQUERVAIN);  Surgeon: Carole Civil, MD;  Location: [facility];  Service: Orthopedics;  Laterality: Right;   ICD GENERATOR CHANGEOUT N/A 10/29/2017   Procedure: ICD GENERATOR CHANGEOUT;  Surgeon: Sanda Klein, MD;  Location: [facility];  Service: Cardiovascular;  Laterality: N/A;   TRANSTHORACIC ECHOCARDIOGRAM  08/03/2011   EF 45-50%, mildly reduced LV systolic function.    Outpatient Medications Prior to Visit  Medication Sig Dispense Refill   ACCU-CHEK FASTCLIX LANCETS MISC 1 each by Does not apply route 2 (two) times daily. 200 each 5   ACCU-CHEK GUIDE test strip USE AS INSTRUCTED TO TEST BLOOD GLUCOSE FOUR TIMES DAILY 400 strip 1   acetaminophen (TYLENOL) 325 MG tablet Take 650 mg by mouth in the morning and at bedtime.     amoxicillin-clavulanate (AUGMENTIN) 875-125 MG tablet Take by mouth 2 (two) times daily.     aspirin EC 81 MG tablet Take 1 tablet (81 mg total) by mouth daily. 90 tablet 3   benzonatate (TESSALON) 200 MG capsule Take 200 mg by mouth 3 (three) times daily.     Blood Glucose Monitoring Suppl (ACCU-CHEK GUIDE) w/Device KIT Use as directed to check blood glucose four times daily 1 kit 0   carvedilol (COREG) 12.5 MG tablet TAKE 1/2 TABLET BY MOUTH TWICE A DAY WITH A MEAL 90 tablet 3   Insulin Lispro Prot & Lispro (HUMALOG 75/25 MIX) (75-25) 100 UNIT/ML Kwikpen INJECT 20 UNITS INTO THE SKIN 2 (TWO) TIMES DAILY. 45 mL 0   JARDIANCE 10 MG TABS tablet TAKE 1 TABLET BY MOUTH EVERY DAY 90 tablet 3   Lancets Misc. (ACCU-CHEK FASTCLIX LANCET) KIT 1 each by Does not apply route 2 (two) times daily. 1 kit 2   sacubitril-valsartan (ENTRESTO) 49-51 MG TAKE 1 TABLET BY MOUTH TWICE A DAY 60 tablet 8   spironolactone (ALDACTONE) 25 MG tablet Take 0.5  tablets (12.5 mg total) by mouth daily. 90 tablet 3   atorvastatin (LIPITOR) 10 MG tablet TAKE 1 TABLET BY MOUTH EVERY DAY 90 tablet 3   atorvastatin (LIPITOR) 20 MG tablet TAKE 1 TABLET BY MOUTH EVERY DAY 90 tablet 1   cyclobenzaprine (FLEXERIL) 10 MG tablet Take 10 mg by mouth 3 (three) times daily. (Patient not taking: Reported on 03/23/2022)     fluconazole (DIFLUCAN) 150 MG tablet Take 150 mg by mouth daily. (Patient not taking: Reported on 03/23/2022)     No facility-administered medications prior to visit.     Allergies:   Patient has no known allergies.   Social History   Socioeconomic History   Marital status: Married    Spouse name: Not on file   Number of children: Not on file   Years of education: Not on file   Highest education level: Not on file  Occupational History   Not on file  Tobacco Use   Smoking status: Never   Smokeless tobacco: Current    Types: Snuff   Tobacco comments:  3 dips per day  Vaping Use   Vaping Use: Never used  Substance and Sexual Activity   Alcohol use: No   Drug use: No   Sexual activity: Yes    Birth control/protection: None  Other Topics Concern   Not on file  Social History Narrative   Not on file   Social Determinants of Health   Financial Resource Strain: Not on file  Food Insecurity: Not on file  Transportation Needs: Not on file  Physical Activity: Not on file  Stress: Not on file  Social Connections: Not on file     ROS:   Please see the history of present illness.    ROS All other systems are reviewed and are negative.   PHYSICAL EXAM:   VS:  BP (!) 101/51   Pulse 65   Ht 5\' 5"  (1.651 m)   Wt 199 lb 12.8 oz (90.6 kg)   SpO2 94%   BMI 33.25 kg/m      General: Alert, oriented x3, no distress, obese. Healthy device scar/pocket. Head: no evidence of trauma, PERRL, EOMI, no exophtalmos or lid lag, no myxedema, no xanthelasma; normal ears, nose and oropharynx Neck: normal jugular venous pulsations and no  hepatojugular reflux; brisk carotid pulses without delay and no carotid bruits Chest: clear to auscultation, no signs of consolidation by percussion or palpation, normal fremitus, symmetrical and full respiratory excursions Cardiovascular: normal position and quality of the apical impulse, regular rhythm, normal first and second heart sounds, no murmurs, rubs or gallops Abdomen: no tenderness or distention, no masses by palpation, no abnormal pulsatility or arterial bruits, normal bowel sounds, no hepatosplenomegaly Extremities: no clubbing, cyanosis or edema; 2+ radial, ulnar and brachial pulses bilaterally; 2+ right femoral, posterior tibial and dorsalis pedis pulses; 2+ left femoral, posterior tibial and dorsalis pedis pulses; no subclavian or femoral bruits Neurological: grossly nonfocal Psych: Normal mood and affect    Wt Readings from Last 3 Encounters:  03/23/22 199 lb 12.8 oz (90.6 kg)  01/01/22 203 lb 3.2 oz (92.2 kg)  08/13/21 209 lb (94.8 kg)      Studies/Labs Reviewed:   ECHO 03/21/2021:   1. Left ventricular ejection fraction, by estimation, is 30 to 35%. The  left ventricle has moderately decreased function. The left ventricle  demonstrates global hypokinesis. The left ventricular internal cavity size  was mildly dilated. There is mild  left ventricular hypertrophy. Left ventricular diastolic parameters are  consistent with Grade I diastolic dysfunction (impaired relaxation).  Elevated left atrial pressure.   2. Right ventricular systolic function is normal. The right ventricular  size is normal.   3. Left atrial size was mildly dilated.   4. The mitral valve is normal in structure. Trivial mitral valve  regurgitation. No evidence of mitral stenosis.   5. The aortic valve is tricuspid. Aortic valve regurgitation is not  visualized. Aortic valve sclerosis is present, with no evidence of aortic  valve stenosis.   6. The inferior vena cava is normal in size with greater  than 50%  respiratory variability, suggesting right atrial pressure of 3 mmHg.   EKG:  EKG is ordered today.  It shows A paced, V sensed rhythm (LBBB 142 ms, left axis deviation). QTc 487 ms.  Recent Labs: 07/24/2021: Hemoglobin 13.4; NT-Pro BNP 292; Platelets 275 12/22/2021: ALT 6; BUN 13; Creatinine, Ser 0.93; Potassium 3.9; Sodium 143; TSH 1.190   Lipid Panel    Component Value Date/Time   CHOL 132 12/22/2021 0759   TRIG  54 12/22/2021 0759   HDL 52 12/22/2021 0759   CHOLHDL 2.5 12/22/2021 0759   CHOLHDL 3.5 08/28/2019 0851   VLDL 14 09/26/2015 0856   LDLCALC 68 12/22/2021 0759   LDLCALC 141 (H) 08/28/2019 0851   ASSESSMENT:    1. Chronic combined systolic and diastolic CHF (congestive heart failure) ([facility])   2. Cardiomyopathy, nonischemic ([place])   3. ICD (implantable cardioverter-defibrillator) in place   4. Diabetic nephropathy associated with type 2 diabetes mellitus ([place])   5. PAF (paroxysmal atrial fibrillation) ([place])   6. Mixed hyperlipidemia      PLAN:  In order of problems listed above:  CMP/CHF:  NYHA class 2 (occasionally 3), euvolemic by exam and by Optivol.  Most recent ejection fraction 30-35% by echo performed in March 2023.  She is on carvedilol and Entresto, Jardiance andspironolactone in maximum tolerated doses.   HTN: Her blood pressure is a little low today, but she is asymptomatic. ICD: Normal device function.  Note that she has an abandoned lead and cannot unfortunately have MRIs even though she has an MRI conditional system.  Option for future CRT upgrade if clinical status deteriorates. DM: Normal creatinine level, has known diabetic nephropathy.  Hemoglobin A1c is good at 7.0%.   AFib: Extremely low burden, a total of 11 minutes in the last year, with most episodes lasting for about a minute.  I don't think anticoagulation is indicated, we will continue to monitor for lengthy episodes of atrial fibrillation. HLP: All lipid parameters in target range on  current statin.  No complaints of myalgia today.    Medication Adjustments/Labs and Tests Ordered: Current medicines are reviewed at length with the patient today.  Concerns regarding medicines are outlined above.  Medication changes, Labs and Tests ordered today are listed in the Patient Instructions below. Patient Instructions  Medication Instructions:  No changes *If you need a refill on your cardiac medications before your next appointment, please call your pharmacy*  Follow-Up: At [facility], you and your health needs are our priority.  As part of our continuing mission to provide you with exceptional heart care, we have created designated Provider Care Teams.  These Care Teams include your primary Cardiologist (physician) and Advanced Practice Providers (APPs -  Physician Assistants and Nurse Practitioners) who all work together to provide you with the care you need, when you need it.  We recommend signing up for the patient portal called "MyChart".  Sign up information is provided on this After Visit Summary.  MyChart is used to connect with patients for Virtual Visits (Telemedicine).  Patients are able to view lab/test results, encounter notes, upcoming appointments, etc.  Non-urgent messages can be sent to your provider as well.   To learn more about what you can do with MyChart, go to [email or web address].    Your next appointment:    1 yr- device with Dr Sallyanne Kuster  Overdue for Appt with Dr Claiborne Billings- please schedule (was 6 mo f/u)       Signed, Sanda Klein, MD  03/28/2022 5:12 PM    [address], [place], [place]  [id number] Phone: [phone]; Fax: [phone]

## 2022-03-23 NOTE — Patient Instructions (Signed)
Medication Instructions:  No changes *If you need a refill on your cardiac medications before your next appointment, please call your pharmacy*  Follow-Up: At [facility], you and your health needs are our priority.  As part of our continuing mission to provide you with exceptional heart care, we have created designated Provider Care Teams.  These Care Teams include your primary Cardiologist (physician) and Advanced Practice Providers (APPs -  Physician Assistants and Nurse Practitioners) who all work together to provide you with the care you need, when you need it.  We recommend signing up for the patient portal called "MyChart".  Sign up information is provided on this After Visit Summary.  MyChart is used to connect with patients for Virtual Visits (Telemedicine).  Patients are able to view lab/test results, encounter notes, upcoming appointments, etc.  Non-urgent messages can be sent to your provider as well.   To learn more about what you can do with MyChart, go to [email or web address].    Your next appointment:    1 yr- device with Dr Sallyanne Kuster  Overdue for Appt with Dr Claiborne Billings- please schedule (was 6 mo f/u)

## 2022-03-28 ENCOUNTER — Encounter: Payer: Self-pay | Admitting: Cardiovascular Disease

## 2022-04-06 ENCOUNTER — Ambulatory Visit: Payer: Medicare PPO | Attending: Cardiovascular Disease

## 2022-04-06 DIAGNOSIS — I5042 Chronic combined systolic (congestive) and diastolic (congestive) heart failure: Secondary | ICD-10-CM | POA: Diagnosis not present

## 2022-04-06 DIAGNOSIS — Z95810 Presence of automatic (implantable) cardiac defibrillator: Secondary | ICD-10-CM | POA: Diagnosis not present

## 2022-04-08 ENCOUNTER — Telehealth: Payer: Self-pay

## 2022-04-08 NOTE — Progress Notes (Signed)
EPIC Encounter for ICM Monitoring  Patient Name: Diana Campbell is a 77 y.o. female Date: 04/08/2022 Primary Care Physican: Scherrie Bateman Primary Cardiologist: Claiborne Billings Electrophysiologist: Cro[place] 05/28/2021 Weight: 205 lbs 01/01/2022 Office Weight: 203 lbs  Since 23-Mar-2022 Time in AT/AF <0.1 hr/day (<0.1%) Longest AT/AF 89 seconds                                                            Attempted call to patient and unable to reach.  Transmission reviewed.    Optivol thoracic impedance suggesting normal fluid levels.    Prescribed: No diuretic (Previously on Furosemide 40 mg 1 tablet as needed) Spironolactone 25 mg take 0.5 tablet (12.5 mg total) by mouth daily.   Labs: 12/22/2021 Creatinine 0.93, BUN 13, Potassium 3.9, Sodium 143 10/01/2021 Creatinine 1.0, BUN 16, Potassium 4.7, Sodium 147  07/24/2021 Creatine 1.05, BUN 11, Potassium 4.6, Sodium 143, GFR 55 A complete set of results can be found in Results Review.   Recommendations: Unable to reach.     Follow-up plan: ICM clinic phone appointment on 05/11/2022.   91 day device clinic remote transmission 04/29/2022.     EP/Cardiology Office Visits:  Recall 03/18/2023 with Dr Sallyanne Kuster.   06/08/2022 with Dr Claiborne Billings.     Copy of ICM check sent to Dr. Sallyanne Kuster.     3 month ICM trend: 04/06/2022.    12-14 Month ICM trend:     [name], RN 04/08/2022 1:31 PM

## 2022-04-08 NOTE — Telephone Encounter (Signed)
Remote ICM transmission received.  Attempted call to patient regarding ICM remote transmission and no answer or voice mail message.  

## 2022-04-18 ENCOUNTER — Other Ambulatory Visit: Payer: Self-pay | Admitting: Cardiovascular Disease

## 2022-04-28 ENCOUNTER — Other Ambulatory Visit: Payer: Self-pay | Admitting: Cardiovascular Disease

## 2022-04-29 ENCOUNTER — Ambulatory Visit (INDEPENDENT_AMBULATORY_CARE_PROVIDER_SITE_OTHER): Payer: Medicare PPO

## 2022-04-29 DIAGNOSIS — I428 Other cardiomyopathies: Secondary | ICD-10-CM

## 2022-04-29 LAB — CUP PACEART REMOTE DEVICE CHECK
Battery Remaining Longevity: 49 mo
Battery Voltage: 2.97 V
Brady Statistic AP VP Percent: 0.12 %
Brady Statistic AP VS Percent: 90.61 %
Brady Statistic AS VP Percent: 0.01 %
Brady Statistic AS VS Percent: 9.25 %
Brady Statistic RA Percent Paced: 80.87 %
Brady Statistic RV Percent Paced: 0.14 %
Date Time Interrogation Session: 20240410033521
HighPow Impedance: 42 Ohm
HighPow Impedance: 50 Ohm
Implantable Lead Connection Status: 753985
Implantable Lead Connection Status: 753985
Implantable Lead Implant Date: 20120702
Implantable Lead Implant Date: 20120702
Implantable Lead Location: 753859
Implantable Lead Location: 753860
Implantable Lead Model: 5076
Implantable Lead Model: 6947
Implantable Pulse Generator Implant Date: 20191011
Lead Channel Impedance Value: 342 Ohm
Lead Channel Impedance Value: 399 Ohm
Lead Channel Impedance Value: 456 Ohm
Lead Channel Pacing Threshold Amplitude: 0.75 V
Lead Channel Pacing Threshold Amplitude: 0.75 V
Lead Channel Pacing Threshold Pulse Width: 0.4 ms
Lead Channel Pacing Threshold Pulse Width: 0.4 ms
Lead Channel Sensing Intrinsic Amplitude: 2.375 mV
Lead Channel Sensing Intrinsic Amplitude: 2.375 mV
Lead Channel Sensing Intrinsic Amplitude: 8.875 mV
Lead Channel Sensing Intrinsic Amplitude: 8.875 mV
Lead Channel Setting Pacing Amplitude: 1.5 V
Lead Channel Setting Pacing Amplitude: 2 V
Lead Channel Setting Pacing Pulse Width: 0.4 ms
Lead Channel Setting Sensing Sensitivity: 0.3 mV
Zone Setting Status: 755011
Zone Setting Status: 755011

## 2022-05-03 ENCOUNTER — Other Ambulatory Visit: Payer: Self-pay | Admitting: "Endocrinology

## 2022-05-03 DIAGNOSIS — E119 Type 2 diabetes mellitus without complications: Secondary | ICD-10-CM

## 2022-05-05 ENCOUNTER — Ambulatory Visit: Payer: Medicare PPO | Admitting: "Endocrinology

## 2022-05-05 ENCOUNTER — Encounter: Payer: Self-pay | Admitting: "Endocrinology

## 2022-05-05 VITALS — BP 116/52 | HR 56 | Ht 65.0 in | Wt 200.8 lb

## 2022-05-05 DIAGNOSIS — I10 Essential (primary) hypertension: Secondary | ICD-10-CM | POA: Diagnosis not present

## 2022-05-05 DIAGNOSIS — Z794 Long term (current) use of insulin: Secondary | ICD-10-CM | POA: Diagnosis not present

## 2022-05-05 DIAGNOSIS — E782 Mixed hyperlipidemia: Secondary | ICD-10-CM | POA: Diagnosis not present

## 2022-05-05 DIAGNOSIS — E119 Type 2 diabetes mellitus without complications: Secondary | ICD-10-CM

## 2022-05-05 LAB — POCT GLYCOSYLATED HEMOGLOBIN (HGB A1C): HbA1c, POC (controlled diabetic range): 6.5 % (ref 0.0–7.0)

## 2022-05-05 MED ORDER — INSULIN LISPRO PROT & LISPRO (75-25 MIX) 100 UNIT/ML KWIKPEN: PEN_INJECTOR | SUBCUTANEOUS | 1 refills | Status: DC

## 2022-05-05 NOTE — Progress Notes (Signed)
05/05/2022  Endocrinology follow-up note   Subjective:    Patient ID: Diana Campbell, female    DOB: 1945-05-02, PCP Avis Epley, PA-C   Past Medical History:  Diagnosis Date   Arthritis    Automatic implantable cardioverter-defibrillator in situ 07/2010   Cardiac abnormality    with defibrilator   Chronic combined systolic and diastolic CHF (congestive heart failure) (HCC) 09/04/2015   Diabetes mellitus    H/O cardiac arrest 2006   Hypercholesteremia    Hypertension    Neuropathy    Past Surgical History:  Procedure Laterality Date   CARDIAC CATHETERIZATION  06/22/2003   Severe nonischemic cardiomyopathy, 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   CARDIAC CATHETERIZATION  04/14/2000   Normal coronary arteries.   CARDIAC DEFIBRILLATOR PLACEMENT  07/21/2010   Medtronic Protecta XT DR, model#[id number], serial#[id number]   CARDIOVASCULAR STRESS TEST  04/05/2000   Scintigraphic evidence of mild LV dilatation and global LV dysfunction with an EF 41% with mild inferolateral ischemia.   CARPAL TUNNEL RELEASE Right 01/23/2016   Procedure: CARPAL TUNNEL RELEASE;  Surgeon: Vickki Hearing, MD;  Location: [facility];  Service: Orthopedics;  Laterality: Right;  pt knows to arrive at 6:15   COLONOSCOPY  03/30/2011   Procedure: COLONOSCOPY;  Surgeon: Dalia Heading, MD;  Location: [facility];  Service: Gastroenterology;  Laterality: N/A;   DORSAL COMPARTMENT RELEASE Right 08/11/2013   Procedure: RELEASE DORSAL COMPARTMENT (DEQUERVAIN);  Surgeon: Vickki Hearing, MD;  Location: [facility];  Service: Orthopedics;  Laterality: Right;   ICD GENERATOR CHANGEOUT N/A 10/29/2017   Procedure: ICD GENERATOR CHANGEOUT;  Surgeon: Thurmon Fair, MD;  Location: [facility];  Service: Cardiovascular;  Laterality: N/A;   TRANSTHORACIC ECHOCARDIOGRAM  08/03/2011   EF 45-50%, mildly reduced LV systolic function.    Social History   Socioeconomic History   Marital status: Married    Spouse name: Not on file   Number of children: Not on file   Years of education: Not on file   Highest education level: Not on file  Occupational History   Not on file  Tobacco Use   Smoking status: Never   Smokeless tobacco: Current    Types: Snuff   Tobacco comments:    3 dips per day  Vaping Use   Vaping Use: Never used  Substance and Sexual Activity   Alcohol use: No   Drug use: No   Sexual activity: Yes    Birth control/protection: None  Other Topics Concern   Not on file  Social History Narrative   Not on file   Social Determinants of Health   Financial Resource Strain: Not on file  Food Insecurity: Not on file  Transportation Needs: Not on file  Physical Activity: Not on file  Stress: Not on file  Social Connections: Not on file   Outpatient Encounter Medications as of 05/05/2022  Medication Sig   ACCU-CHEK FASTCLIX LANCETS MISC 1 each by Does not apply route 2 (two) times daily.   ACCU-CHEK GUIDE test strip USE AS INSTRUCTED TO TEST BLOOD GLUCOSE FOUR TIMES DAILY   acetaminophen (TYLENOL) 325 MG tablet Take 650 mg by mouth in the morning and at bedtime.   amoxicillin-clavulanate (AUGMENTIN) 875-125 MG tablet Take by mouth 2 (two) times daily.   aspirin EC 81 MG tablet Take 1 tablet (81 mg total) by mouth daily.   atorvastatin (LIPITOR) 10 MG tablet TAKE 1  TABLET BY MOUTH EVERY DAY   atorvastatin (LIPITOR) 20 MG tablet TAKE 1 TABLET BY MOUTH EVERY DAY   benzonatate (TESSALON) 200 MG capsule Take 200 mg by mouth 3 (three) times daily.   Blood Glucose Monitoring Suppl (ACCU-CHEK GUIDE) w/Device KIT Use as directed to check blood glucose four times daily   carvedilol (COREG) 12.5 MG tablet TAKE 1/2 TABLET BY MOUTH TWICE A DAY WITH A MEAL   ENTRESTO 49-51 MG TAKE 1 TABLET BY MOUTH TWICE A DAY   fluconazole (DIFLUCAN) 150 MG tablet Take 150 mg by mouth daily. (Patient not taking: Reported on  03/23/2022)   Insulin Lispro Prot & Lispro (HUMALOG 75/25 MIX) (75-25) 100 UNIT/ML Kwikpen Inject 20 units before breakfast and 15 units with supper only if glucose readings are above 90 and eating.   JARDIANCE 10 MG TABS tablet TAKE 1 TABLET BY MOUTH EVERY DAY   Lancets Misc. (ACCU-CHEK FASTCLIX LANCET) KIT 1 each by Does not apply route 2 (two) times daily.   spironolactone (ALDACTONE) 25 MG tablet TAKE 1/2 TABLET BY MOUTH EVERY DAY   [DISCONTINUED] Insulin Lispro Prot & Lispro (HUMALOG 75/25 MIX) (75-25) 100 UNIT/ML Kwikpen INJECT 20 UNITS INTO THE SKIN 2 (TWO) TIMES DAILY.   No facility-administered encounter medications on file as of 05/05/2022.   ALLERGIES: No Known Allergies VACCINATION STATUS: Immunization History  Administered Date(s) Administered   Influenza-Unspecified 11/19/2017    Diabetes She is returns for follow-up in the management of her currently uncontrolled type 2 diabetes, hyperlipidemia, hypertension.   Onset time: She was diagnosed with diabetes at approximately 77 years of age.  She is responding to her current insulin regimen.  She presents with controlled glycemic profile to near target levels, point-of-care A1c of 6.5% today.  She did have tight fasting glycemic profile over the last 2 weeks. No major hypoglycemia is documented or reported.    She has done relatively better on premixed insulin twice a day.  She is currently on Humalog 75/25 20 units twice daily with breakfast and supper.  She tolerates this medication very well, complains that she is not affording the Jardiance and will not continue with it after she finishes her current supplies.     Jardiance was started by her cardiologist.   Pertinent negatives for hypoglycemia include no confusion, headaches, pallor or seizures. There are no diabetic associated symptoms. Pertinent negatives for diabetes include no chest pain, no polydipsia, no polyphagia and no polyuria. There are no hypoglycemic  complications. Symptoms are worsening. Diabetic complications include nephropathy. Risk factors for coronary artery disease include diabetes mellitus, dyslipidemia, hypertension, sedentary lifestyle and tobacco exposure. Current diabetic treatment includes premixed insulin twice daily associated with monitoring of blood glucose at least 2 times a day.   She did not document any major hypoglycemia. Her weight is stable, she complains of poor appetite. She is following a generally unhealthy diet, regularly eats "junk food".  She has not had a previous visit with a dietitian (She declined a referral to CDE.)  She has uncontrolled dyslipidemia despite Lipitor 10 mg p.o. daily.  An ACE inhibitor/angiotensin II receptor blocker is being taken. Eye exam is current.    Hyperlipidemia This is a chronic problem. The current episode started more than 1 year ago. Pertinent negatives include no chest pain, myalgias or shortness of breath. Current antihyperlipidemic treatment includes statins. Risk factors for coronary artery disease include dyslipidemia, diabetes mellitus and a sedentary lifestyle.  Her previsit labs show LDL at 127, despite Lipitor 10  mg p.o. daily. Hypertension This is a chronic problem. The current episode started more than 1 year ago. The problem is controlled. Pertinent negatives include no chest pain, headaches, palpitations or shortness of breath. Past treatments include ACE inhibitors. Hypertensive end-organ damage includes kidney disease.    Review of systems: Limited as above.   Objective:    BP (!) 116/52   Pulse (!) 56   Ht 5\' 5"  (1.651 m)   Wt 200 lb 12.8 oz (91.1 kg)   BMI 33.41 kg/m   Wt Readings from Last 3 Encounters:  05/05/22 200 lb 12.8 oz (91.1 kg)  03/23/22 199 lb 12.8 oz (90.6 kg)  01/01/22 203 lb 3.2 oz (92.2 kg)     Physical Exam- Limited  Constitutional:  Body mass index is 33.41 kg/m. , not in acute distress, normal state of mind Eyes:  EOMI, no  exophthalmos       Latest Ref Rng & Units 12/22/2021    7:59 AM 10/01/2021   12:00 AM 07/24/2021    8:18 AM  CMP  Glucose 70 - 99 mg/dL 161   096   BUN 8 - 27 mg/dL 13  16     11    Creatinine 0.57 - 1.00 mg/dL 0.45  1.0     4.09   Sodium 134 - 144 mmol/L 143  147     143   Potassium 3.5 - 5.2 mmol/L 3.9  4.7     4.6   Chloride 96 - 106 mmol/L 106  109     104   CO2 20 - 29 mmol/L 24  19     21    Calcium 8.7 - 10.3 mg/dL 9.5  9.6     9.3   Total Protein 6.0 - 8.5 g/dL 6.5   6.6   Total Bilirubin 0.0 - 1.2 mg/dL 0.5   0.4   Alkaline Phos 44 - 121 IU/L 84  93     92   AST 0 - 40 IU/L 13  13     12    ALT 0 - 32 IU/L 6  6     5       This result is from an external source.    Lab Results  Component Value Date   HGBA1C 6.5 05/05/2022   HGBA1C 7.0 01/01/2022   HGBA1C 7.3 10/01/2021   MICROALBUR 0.7 08/28/2019   MICROALBUR 0.3 04/14/2016   Lipid Panel     Component Value Date/Time   CHOL 132 12/22/2021 0759   TRIG 54 12/22/2021 0759   HDL 52 12/22/2021 0759   CHOLHDL 2.5 12/22/2021 0759   CHOLHDL 3.5 08/28/2019 0851   VLDL 14 09/26/2015 0856   LDLCALC 68 12/22/2021 0759   LDLCALC 141 (H) 08/28/2019 0851     Assessment & Plan:   1. Type 2 diabetes mellitus with long-term current use of insulin (HCC)  -Her diabetes is complicated by cardiomyopathy and she remains at a high risk for more acute and chronic complications of diabetes which include CAD, CVA, CKD, retinopathy, and neuropathy. These are all discussed in detail with the patient.  Patient presents with controlled glycemic profile with point-of-care A1c of 6.5%.  She did not document major hypoglycemia.    Recent labs reviewed.   - I have re-counseled the patient on diet management and weight loss  by adopting a carbohydrate restricted / protein rich  Diet.  - she acknowledges that there is a room for improvement in her food and drink choices. -  Suggestion is made for her to avoid simple carbohydrates  from her  diet including Cakes, Sweet Desserts, Ice Cream, Soda (diet and regular), Sweet Tea, Candies, Chips, Cookies, Store Bought Juices, Alcohol , Artificial Sweeteners,  Coffee Creamer, and "Sugar-free" Products, Lemonade. This will help patient to have more stable blood glucose profile and potentially avoid unintended weight gain.  The following Lifestyle Medicine recommendations according to American College of Lifestyle Medicine  [facility]) were discussed and and offered to patient and she  agrees to start the journey:  A. Whole Foods, Plant-Based Nutrition comprising of fruits and vegetables, plant-based proteins, whole-grain carbohydrates was discussed in detail with the patient.   A list for source of those nutrients were also provided to the patient.  Patient will use only water or unsweetened tea for hydration. B.  The need to stay away from risky substances including alcohol, smoking; obtaining 7 to 9 hours of restorative sleep, at least 150 minutes of moderate intensity exercise weekly, the importance of healthy social connections,  and stress management techniques were discussed. C.  A full color page of  Calorie density of various food groups per pound showing examples of each food groups was provided to the patient.   - Patient is advised to stick to a routine mealtimes to eat 3 meals a day and avoid unnecessary snacks specially with ultra processed foods.    - The patient  declined referral to a  CDE for individualized DM education.  - I have approached patient with the following individualized plan to manage diabetes and patient agrees.   - Patient  worries a lot about hypoglycemia. -She has done very well on premixed insulin.  In the interest of avoiding hypoglycemia, she is advised to lower her Humalog 75/25 to 20 units with breakfast and 15 units with supper for Premeal blood glucose readings above 90 mg per DL.  She is advised to continue with her current supplies of Jardiance 10 mg p.o.  daily at breakfast.  She is complaining of the cost of this medication and will not refill it.  This medication was prescribed by her cardiologist.     -She is advised and agrees to continue monitoring of blood glucose 4 times a day-before meals and at bedtime.    -Patient is encouraged to call clinic for blood glucose levels less than 70 or above 200 mg /dl.  -Her most recent labs show normal renal function.  She would have benefited from low-dose metformin, however she wishes to avoid it at this time.    - Patient specific target  for A1c; LDL, HDL, Triglycerides,were discussed in detail.  2) BP/HTN:  -Her blood pressure is controlled to target.  She is currently on carvedilol 12.5 mg p.o. twice daily, spironolactone 25 mg p.o. once a day.   3) Lipids/HPL: Recent lipid panel showed loss of control her LDL at 127.  She is advised to continue Lipitor 20 mg p.o. nightly.   She will have repeat labs before her next visit.    The above detailed plan predominant lifestyle medicine nutrition will help with dyslipidemia besides type 2 diabetes.    4)  Weight/Diet: Her BMI 33.41-gaining weight.  This is complicating her diabetes care.  She is a candidate for modest weight loss.    She declined CDE consult . exercise, and carbohydrates information provided.  5) Chronic Care/Health Maintenance:  -Patient is on ACEI/ARB and Statin medications and encouraged to continue to follow up with Ophthalmology, Podiatrist at least  yearly or according to recommendations, and advised to  stay away from smoking. I have recommended yearly flu vaccine and pneumonia vaccination at least every 5 years; moderate intensity exercise for up to 150 minutes weekly; and  sleep for at least 7 hours a day.  -She was not ready for foot exam today.  She will be approached again.   - I advised patient to maintain close follow up with Avis Epley, PA-C for primary care needs.  I spent  26  minutes in the care of the  patient today including review of labs from CMP, Lipids, Thyroid Function, Hematology (current and previous including abstractions from other facilities); face-to-face time discussing  her blood glucose readings/logs, discussing hypoglycemia and hyperglycemia episodes and symptoms, medications doses, her options of short and long term treatment based on the latest standards of care / guidelines;  discussion about incorporating lifestyle medicine;  and documenting the encounter. Risk reduction counseling performed per USPSTF guidelines to reduce  obesity and cardiovascular risk factors.     Please refer to Patient Instructions for Blood Glucose Monitoring and Insulin/Medications Dosing Guide"  in media tab for additional information. Please  also refer to " Patient Self Inventory" in the Media  tab for reviewed elements of pertinent patient history.  Kemyah Jana Half participated in the discussions, expressed understanding, and voiced agreement with the above plans.  All questions were answered to her satisfaction. she is encouraged to contact clinic should she have any questions or concerns prior to her return visit.   Follow up plan: -Return in about 4 months (around 09/04/2022) for F/U with Pre-visit Labs, Meter/CGM/Logs, A1c here.  Marquis Lunch, MD Phone: [phone]  Fax: [phone]  -  This note was partially dictated with voice recognition software. Similar sounding words can be transcribed inadequately or may not  be corrected upon review.  05/05/2022, 6:12 PM

## 2022-05-05 NOTE — Patient Instructions (Signed)

## 2022-05-11 ENCOUNTER — Ambulatory Visit: Payer: Medicare PPO | Attending: Cardiovascular Disease

## 2022-05-11 DIAGNOSIS — Z95810 Presence of automatic (implantable) cardiac defibrillator: Secondary | ICD-10-CM | POA: Diagnosis not present

## 2022-05-11 DIAGNOSIS — I5042 Chronic combined systolic (congestive) and diastolic (congestive) heart failure: Secondary | ICD-10-CM

## 2022-05-12 ENCOUNTER — Telehealth: Payer: Self-pay

## 2022-05-12 NOTE — Telephone Encounter (Signed)
Remote ICM transmission received.  Attempted call to patient regarding ICM remote transmission and no answer.  

## 2022-05-12 NOTE — Progress Notes (Signed)
EPIC Encounter for ICM Monitoring  Patient Name: Diana Campbell is a 77 y.o. female Date: 05/12/2022 Primary Care Physican: Ladon Applebaum Primary Cardiologist: Tresa Endo Electrophysiologist: Croitoru 05/28/2021 Weight: 205 lbs 01/01/2022 Office Weight: 203 lbs   Since 29-Apr-2022 Time in AT/AF  0.0 hr/day (0.0%)                                                             Attempted call to patient and unable to reach.  Transmission reviewed.    Optivol thoracic impedance suggesting possible fluid accumulation starting 3/20 but trending close to baseline.  Fluid index is greater than normal threshold starting 4/8.    Prescribed: No diuretic (Previously on Furosemide 40 mg 1 tablet as needed) Spironolactone 25 mg take 0.5 tablet (12.5 mg total) by mouth daily.   Labs: 12/22/2021 Creatinine 0.93, BUN 13, Potassium 3.9, Sodium 143 10/01/2021 Creatinine 1.0, BUN 16, Potassium 4.7, Sodium 147  07/24/2021 Creatine 1.05, BUN 11, Potassium 4.6, Sodium 143, GFR 55 A complete set of results can be found in Results Review.   Recommendations: Unable to reach.     Follow-up plan: ICM clinic phone appointment on 05/19/2022 to recheck fluid levels.   91 day device clinic remote transmission 07/29/2022.     EP/Cardiology Office Visits:  Recall 03/18/2023 with Dr Royann Shivers.   06/08/2022 with Dr Tresa Endo.     Copy of ICM check sent to Dr. Royann Shivers.   Will send to Dr Tresa Endo for review if patient is reached.   3 month ICM trend: 05/11/2022.    12-14 Month ICM trend:     [name], RN 05/12/2022 4:48 PM

## 2022-05-19 ENCOUNTER — Ambulatory Visit: Payer: Medicare PPO | Attending: Cardiovascular Disease

## 2022-05-19 DIAGNOSIS — Z95810 Presence of automatic (implantable) cardiac defibrillator: Secondary | ICD-10-CM

## 2022-05-19 DIAGNOSIS — I5042 Chronic combined systolic (congestive) and diastolic (congestive) heart failure: Secondary | ICD-10-CM

## 2022-05-21 ENCOUNTER — Other Ambulatory Visit: Payer: Self-pay | Admitting: Cardiovascular Disease

## 2022-05-22 ENCOUNTER — Telehealth: Payer: Self-pay

## 2022-05-22 NOTE — Telephone Encounter (Signed)
Remote ICM transmission received.  Attempted call to patient regarding ICM remote transmission and no answer.  

## 2022-05-22 NOTE — Progress Notes (Signed)
EPIC Encounter for ICM Monitoring  Patient Name: Diana Campbell is a 77 y.o. female Date: 05/22/2022 Primary Care Physican: Ladon Applebaum Primary Cardiologist: Tresa Endo Electrophysiologist: Croitoru 05/28/2021 Weight: 205 lbs 01/01/2022 Office Weight: 203 lbs  Since 11-May-2022 Time in AT/AF  0.0 hr/day (0.0%)                                                              Attempted call to patient and unable to reach.  Transmission reviewed.    Optivol thoracic impedance suggesting fluid levels returned to normal on 4/21.     Prescribed: No diuretic (Previously on Furosemide 40 mg 1 tablet as needed) Spironolactone 25 mg take 0.5 tablet (12.5 mg total) by mouth daily.   Labs: 12/22/2021 Creatinine 0.93, BUN 13, Potassium 3.9, Sodium 143 10/01/2021 Creatinine 1.0, BUN 16, Potassium 4.7, Sodium 147  07/24/2021 Creatine 1.05, BUN 11, Potassium 4.6, Sodium 143, GFR 55 A complete set of results can be found in Results Review.   Recommendations: Unable to reach.     Follow-up plan: ICM clinic phone appointment on 06/22/2022.   91 day device clinic remote transmission 07/29/2022.     EP/Cardiology Office Visits:  Recall 03/18/2023 with Dr Royann Shivers.   06/08/2022 with Dr Tresa Endo.     Copy of ICM check sent to Dr. Royann Shivers.   3 month ICM trend: 05/19/2022.    12-14 Month ICM trend:     [name], RN 05/22/2022 2:18 PM

## 2022-06-05 NOTE — Progress Notes (Signed)
Remote ICD transmission.   

## 2022-06-08 ENCOUNTER — Encounter: Payer: Self-pay | Admitting: Cardiovascular Disease

## 2022-06-08 ENCOUNTER — Ambulatory Visit: Payer: Medicare PPO | Attending: Cardiovascular Disease | Admitting: Cardiovascular Disease

## 2022-06-08 DIAGNOSIS — I10 Essential (primary) hypertension: Secondary | ICD-10-CM | POA: Diagnosis not present

## 2022-06-08 DIAGNOSIS — Z720 Tobacco use: Secondary | ICD-10-CM

## 2022-06-08 DIAGNOSIS — Z95810 Presence of automatic (implantable) cardiac defibrillator: Secondary | ICD-10-CM | POA: Diagnosis not present

## 2022-06-08 DIAGNOSIS — I5042 Chronic combined systolic (congestive) and diastolic (congestive) heart failure: Secondary | ICD-10-CM

## 2022-06-08 DIAGNOSIS — I428 Other cardiomyopathies: Secondary | ICD-10-CM | POA: Diagnosis not present

## 2022-06-08 DIAGNOSIS — E669 Obesity, unspecified: Secondary | ICD-10-CM

## 2022-06-08 DIAGNOSIS — E118 Type 2 diabetes mellitus with unspecified complications: Secondary | ICD-10-CM

## 2022-06-08 DIAGNOSIS — M79604 Pain in right leg: Secondary | ICD-10-CM

## 2022-06-08 DIAGNOSIS — E7800 Pure hypercholesterolemia, unspecified: Secondary | ICD-10-CM

## 2022-06-08 DIAGNOSIS — Z794 Long term (current) use of insulin: Secondary | ICD-10-CM

## 2022-06-08 DIAGNOSIS — M79605 Pain in left leg: Secondary | ICD-10-CM

## 2022-06-08 DIAGNOSIS — Z79899 Other long term (current) drug therapy: Secondary | ICD-10-CM

## 2022-06-08 MED ORDER — CARVEDILOL 12.5 MG PO TABS: 12.5000 mg | ORAL_TABLET | Freq: Two times a day (BID) | ORAL | 3 refills | Status: DC

## 2022-06-08 NOTE — Progress Notes (Signed)
Patient ID: JARRETT KORTAN, female   DOB: 1945/04/20, 77 y.o.   MRN: [id number]        HPI: KENASIA BRACKENS is a 77 y.o. female who presents to the office for a 10 month  follow-up cardiology evaluation.       Ms. Lucci suffered a ventricular fibrillation cardiac arrest in June 2005 at which time she was successfully resuscitated. She was found to have a nonischemic cardiomyopathy;  initial ejection fraction 15%. Catheterization revealed 30-40% intermediate stenosis. At that time, she underwent single-chamber Medtronic ICD implantation and initially had a spring fidelis lead. In July 2012 she underwent complete revision of her system.  An echo Doppler study in July 2013 showed an ejection fraction now at 45-50% with mild global hypokinesis. She did have mild MR, mild TR, and mild aortic sclerosis.  Additional problems include type 2 diabetes mellitus on insulin, hypertension, hyperlipidemia, and probable untreated obstructive sleep apnea.  In the past she has had some mild edema.  She has been followed by Dr. Royann Shivers for her defibrillator.  In September 2018  ICD interrogation showed normal device function, normal lead parameters, no episodes of ventricular tachycardia and she was atrially pacing 86% of the time.  There was no ventricular pacing.  She had a favorable heart rate histogram and thoracic impedance was fairly stable but at times she had some evidence for fluid overload.  When I last saw her in March 2019, she denied any episodes of chest pain.  She was experiencing mild shortness of breath particularly when she bends over and also admitted to some mild ankle swelling.  She has chronic low back pain and for this reason she does not exercise.  Since it had been 6 years since her last echo Doppler study I recommended she undergo a follow-up echocardiographic evaluation.  He echo Doppler study showed an EF of 40 to 45% with mild LV dilation and diffuse hypokinesis.  There is no  significant valvular abnormalities.  She underwent elective generator change out for her ICD in October 2019.  She has an abandoned lead and unfortunately cannot have MRIs even though she has an MRI conditional system.  She was hospitalized January 2020 with sepsis secondary to UTI.  She has continued to be followed by Dr. Royann Shivers for the next.  She was evaluated by Corine Shelter in 2020, [place], [place] she has continued to see Dr. Royann Shivers.  She has been transitioned to [facility] and has been treated with carvedilol and loop diuretic.  She was seen by Pearletha Furl, RPH-CPP most recently in March 2022.  At that time, the patient wanted to be off Entresto but further discussion was made and the patient was willing to continue [facility] for another month.  She felt the Sherryll Burger was causing her blood pressure to go up but apparently her diet was poor and she was eating cookies and soda for breakfast and continue to eat hotdogs and food high in sodium most likely contributing to her blood pressure elevation.  I saw her in July 2022 after not having seen her since March 2019.  She had stopped her Entresto.  She denied she subsequently stopped her Entresto.  She denied chest pain ,PND or orthopnea.  When last seen by Dr. Royann Shivers there was some discussion about possible SGLT2 inhibition.  During 1, I recommended a retrial of Entresto 24/26 mg twice a day.  I also recommended initiation of Jardiance 10 mg.    I  saw her on  November 22, 2020.  At time she was tolerating Entresto 24/26 mg twice a day.  I recommended further titration of her Entresto to 49/51 mg twice a day.  She denied any chest pain.  I did her with samples of Jardiance 10 mg for SGLT 2 inhibition and recommended follow-up pharmacy evaluation.  I also recommended a follow-up echo Doppler study prior to her next office visit.  On March 21, 2021 she underwent her follow-up echo Doppler study which showed an EF of 30 to 35% with global  hypokinesis.  There was grade 1 diastolic dysfunction, aortic valve sclerosis without stenosis, and mild dilation of left atrium.  I saw her on April 11, 2021 at which time she denied any chest pain.  She was experiencing some mild shortness of breath particularly with walking to the mailbox.  At times she also notes trace swelling in her ankles and she takes furosemide every 3 to 4 days on a as needed basis.  Presently her medical regimen consists of Entresto 49/51 mg twice a day, Jardiance 10 mg daily, carvedilol 6.25 mg twice a day, and she was to have increased to atorvastatin to 20 mg but apparently she is only taking 10 mg.  She continues to undergo ICD remote defibrillator checks sees Dr. Royann Shivers.  During that evaluation, I recommended the addition of spironolactone 12.5 mg to her current regimen I recommended follow-up laboratory in several weeks with potential future titration of Entresto to maximum dosing.  I last saw her on August 13, 2021.  Since her prior evaluation, she continues to feel well.  However she has issues with low back discomfort.  She is followed by Terie Purser, PA-C at [facility] in [place].  Apparently she has been using snuff since age 12 and dips 3 times per day.  She continues to be on carvedilol 12.5 mg twice a day, Entresto 49/51 mg twice a day, Jardiance 10 mg, and spironolactone 12.5 mg.  She is on atorvastatin 20 mg for lipid therapy and is on insulin for her diabetes.  During that evaluation, I discussed discontinuance of snuff if at all possible.  Laboratory revealed stable renal function with creatinine 1.05 and normal LFTs.  She was anemic with mild microcytic indices with MCV at 78.  LDL was 74 with total cholesterol 140 triglycerides 68, TSH was normal 9.88.  NT proBNP was normal at 292 on current therapy.  Since I saw her, she has felt well.  She continues to use snuff.  She is followed by Dr. Foy Guadalajara for her diabetes.  She has seen Dr. Royann Shivers  who follows her ICD and last saw him on March 23, 2022.  Remote device check on May 03, 2022 was stable.  At times she admits to some leg discomfort with walking.  She is unaware of any palpitations.  She continues to be on carvedilol 6.25 mg twice a day, Entresto 49/51 mg twice a day, Jardiance 10 mg daily, and spironolactone 12.5 mg daily.  She is on atorvastatin 20 mg. She presents for evaluation.  Past Medical History:  Diagnosis Date   Arthritis    Automatic implantable cardioverter-defibrillator in situ 07/2010   Cardiac abnormality    with defibrilator   Chronic combined systolic and diastolic CHF (congestive heart failure) (HCC) 09/04/2015   Diabetes mellitus    H/O cardiac arrest 2006   Hypercholesteremia    Hypertension    Neuropathy     Past Surgical History:  Procedure Laterality Date  CARDIAC CATHETERIZATION  06/22/2003   Severe nonischemic cardiomyopathy, 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   CARDIAC CATHETERIZATION  04/14/2000   Normal coronary arteries.   CARDIAC DEFIBRILLATOR PLACEMENT  07/21/2010   Medtronic Protecta XT DR, model#[id number], serial#[id number]   CARDIOVASCULAR STRESS TEST  04/05/2000   Scintigraphic evidence of mild LV dilatation and global LV dysfunction with an EF 41% with mild inferolateral ischemia.   CARPAL TUNNEL RELEASE Right 01/23/2016   Procedure: CARPAL TUNNEL RELEASE;  Surgeon: Vickki Hearing, MD;  Location: [facility];  Service: Orthopedics;  Laterality: Right;  pt knows to arrive at 6:15   COLONOSCOPY  03/30/2011   Procedure: COLONOSCOPY;  Surgeon: Dalia Heading, MD;  Location: [facility];  Service: Gastroenterology;  Laterality: N/A;   DORSAL COMPARTMENT RELEASE Right 08/11/2013   Procedure: RELEASE DORSAL COMPARTMENT (DEQUERVAIN);  Surgeon: Vickki Hearing, MD;  Location: [facility];  Service: Orthopedics;  Laterality: Right;   ICD GENERATOR CHANGEOUT N/A  10/29/2017   Procedure: ICD GENERATOR CHANGEOUT;  Surgeon: Thurmon Fair, MD;  Location: [facility] INVASIVE CV LAB;  Service: Cardiovascular;  Laterality: N/A;   TRANSTHORACIC ECHOCARDIOGRAM  08/03/2011   EF 45-50%, mildly reduced LV systolic function.    No Known Allergies  Current Outpatient Medications  Medication Sig Dispense Refill   ACCU-CHEK FASTCLIX LANCETS MISC 1 each by Does not apply route 2 (two) times daily. 200 each 5   ACCU-CHEK GUIDE test strip USE AS INSTRUCTED TO TEST BLOOD GLUCOSE FOUR TIMES DAILY 400 strip 1   acetaminophen (TYLENOL) 325 MG tablet Take 650 mg by mouth in the morning and at bedtime.     aspirin EC 81 MG tablet Take 1 tablet (81 mg total) by mouth daily. 90 tablet 3   atorvastatin (LIPITOR) 10 MG tablet TAKE 1 TABLET BY MOUTH EVERY DAY 90 tablet 3   atorvastatin (LIPITOR) 20 MG tablet TAKE 1 TABLET BY MOUTH EVERY DAY 90 tablet 1   benzonatate (TESSALON) 200 MG capsule Take 200 mg by mouth 3 (three) times daily.     Blood Glucose Monitoring Suppl (ACCU-CHEK GUIDE) w/Device KIT Use as directed to check blood glucose four times daily 1 kit 0   carvedilol (COREG) 12.5 MG tablet Take 1 tablet (12.5 mg total) by mouth 2 (two) times daily. 180 tablet 3   ENTRESTO 49-51 MG TAKE 1 TABLET BY MOUTH TWICE A DAY 60 tablet 8   fluconazole (DIFLUCAN) 150 MG tablet Take 150 mg by mouth daily.     Insulin Lispro Prot & Lispro (HUMALOG 75/25 MIX) (75-25) 100 UNIT/ML Kwikpen Inject 20 units before breakfast and 15 units with supper only if glucose readings are above 90 and eating. 30 mL 1   JARDIANCE 10 MG TABS tablet TAKE 1 TABLET BY MOUTH EVERY DAY 90 tablet 3   Lancets Misc. (ACCU-CHEK FASTCLIX LANCET) KIT 1 each by Does not apply route 2 (two) times daily. 1 kit 2   spironolactone (ALDACTONE) 25 MG tablet TAKE 1/2 TABLET BY MOUTH EVERY DAY 45 tablet 11   amoxicillin-clavulanate (AUGMENTIN) 875-125 MG tablet Take by mouth 2 (two) times daily. (Patient not taking: Reported on  06/08/2022)     No current facility-administered medications for this visit.    Social History   Socioeconomic History   Marital status: Married    Spouse name: Not on file   Number of children: Not on file   Years of education: Not on file  Highest education level: Not on file  Occupational History   Not on file  Tobacco Use   Smoking status: Never   Smokeless tobacco: Current    Types: Snuff   Tobacco comments:    3 dips per day  Vaping Use   Vaping Use: Never used  Substance and Sexual Activity   Alcohol use: No   Drug use: No   Sexual activity: Yes    Birth control/protection: None  Other Topics Concern   Not on file  Social History Narrative   Not on file   Social Determinants of Health   Financial Resource Strain: Not on file  Food Insecurity: Not on file  Transportation Needs: Not on file  Physical Activity: Not on file  Stress: Not on file  Social Connections: Not on file  Intimate Partner Violence: Not on file   Socially she is married and has 4 children and several grandchildren.  Parents are deceased  ROS General: Negative; No fevers, chills, or night sweats; positive for fatigue HEENT: Negative; No changes in vision or hearing, sinus congestion, difficulty swallowing Pulmonary: Negative; No cough, wheezing, shortness of breath, hemoptysis Cardiovascular: see HPI GI: Negative; No nausea, vomiting, diarrhea, or abdominal pain GU: Negative; No dysuria, hematuria, or difficulty voiding Musculoskeletal: Low back discomfort Hematologic/Oncology: Negative; no easy bruising, bleeding Endocrine: Positive for diabetes mellitus Neuro: Negative; no changes in balance, headaches Skin: Negative; No rashes or skin lesions Psychiatric: Negative; No behavioral problems, depression Sleep: Probable untreated sleep apnea with history of snoring, fatigue, frequent awakenings.  She did not undergo her recommended sleep study. Other comprehensive 14 point system  review is negative.   PE BP 124/76   Pulse 67   Ht 5\' 8"  (1.727 m)   Wt 194 lb 12.8 oz (88.4 kg)   SpO2 97%   BMI 29.62 kg/m    Repeat blood pressure by me 116/76  Wt Readings from Last 3 Encounters:  06/08/22 194 lb 12.8 oz (88.4 kg)  05/05/22 200 lb 12.8 oz (91.1 kg)  03/23/22 199 lb 12.8 oz (90.6 kg)    General: Alert, oriented, no distress.  Skin: normal turgor, no rashes, warm and dry HEENT: Normocephalic, atraumatic. Pupils equal round and reactive to light; sclera anicteric; extraocular muscles intact;  Nose without nasal septal hypertrophy Mouth/Parynx benign; Mallinpatti scale 3 Neck: No JVD, no carotid bruits; normal carotid upstroke Lungs: clear to ausculatation and percussion; no wheezing or rales Chest wall: without tenderness to palpitation Heart: PMI not displaced, RRR, s1 s2 normal, 1/6 systolic murmur, no diastolic murmur, no rubs, gallops, thrills, or heaves Abdomen: soft, nontender; no hepatosplenomehaly, BS+; abdominal aorta nontender and not dilated by palpation. Back: no CVA tenderness Pulses 2+ Musculoskeletal: full range of motion, normal strength, no joint deformities Extremities: no clubbing cyanosis or edema, Homan's sign negative  Neurologic: grossly nonfocal; Cranial nerves grossly wnl Psychologic: Normal mood and affect  Jun 08, 2022 ECG (independently read by me):  Atrial paced at 67, prolonged AV conduction, PR 214 msec, PVCs   August 13, 2021 ECG (independently read by me): Atrial paced, PR 214 msec,    April 11, 2021 ECG (independently read by me): 100% atrial pacing.  Isolated PVC.  PR interval 208 ms  November 29, 2020 ECG (independently read by me): Atrial paced at 62; prolonged AV conduction PR 212 msec   July 25, 2020 ECG (independently read by me): Atrial paced at 62  March 2019 ECG (independently read by me): Telemetry paced rhythm at 63  bpm.  PR interval 206 ms.  March 2017 ECG (independently read by me): Atrially paced rhythm  at 61 bpm.  December 2014 ECG: Atrial lead paced rhythm at 79 beats per minute. PRWP.  Isolated PVC.  LABS:     Latest Ref Rng & Units 12/22/2021    7:59 AM 10/01/2021   12:00 AM 07/24/2021    8:18 AM  BMP  Glucose 70 - 99 mg/dL 962   952   BUN 8 - 27 mg/dL 13  16     11    Creatinine 0.57 - 1.00 mg/dL 8.41  1.0     3.24   BUN/Creat Ratio 12 - 28 14   10    Sodium 134 - 144 mmol/L 143  147     143   Potassium 3.5 - 5.2 mmol/L 3.9  4.7     4.6   Chloride 96 - 106 mmol/L 106  109     104   CO2 20 - 29 mmol/L 24  19     21    Calcium 8.7 - 10.3 mg/dL 9.5  9.6     9.3      This result is from an external source.      Latest Ref Rng & Units 12/22/2021    7:59 AM 10/01/2021   12:00 AM 07/24/2021    8:18 AM  Hepatic Function  Total Protein 6.0 - 8.5 g/dL 6.5   6.6   Albumin 3.8 - 4.8 g/dL 3.8  4.1     4.0   AST 0 - 40 IU/L 13  13     12    ALT 0 - 32 IU/L 6  6     5    Alk Phosphatase 44 - 121 IU/L 84  93     92   Total Bilirubin 0.0 - 1.2 mg/dL 0.5   0.4      This result is from an external source.      Latest Ref Rng & Units 07/24/2021    8:18 AM 12/16/2020    8:04 AM 01/31/2018    6:53 AM  CBC  WBC 3.4 - 10.8 x10E3/uL 8.1  8.4  14.3   Hemoglobin 11.1 - 15.9 g/dL 40.1  02.7  25.3   Hematocrit 34.0 - 46.6 % 41.0  41.8  36.3   Platelets 150 - 450 x10E3/uL 275  286  310    Lab Results  Component Value Date   MCV 78 (L) 07/24/2021   MCV 80 12/16/2020   MCV 77.2 (L) 01/31/2018   Lab Results  Component Value Date   TSH 1.190 12/22/2021   Lab Results  Component Value Date   HGBA1C 6.5 05/05/2022   Lipid Panel     Component Value Date/Time   CHOL 132 12/22/2021 0759   TRIG 54 12/22/2021 0759   HDL 52 12/22/2021 0759   CHOLHDL 2.5 12/22/2021 0759   CHOLHDL 3.5 08/28/2019 0851   VLDL 14 09/26/2015 0856   LDLCALC 68 12/22/2021 0759   LDLCALC 141 (H) 08/28/2019 0851     RADIOLOGY: No results found.  IMPRESSION:  1. Cardiomyopathy, nonischemic (HCC)   2. Chronic  combined systolic and diastolic CHF (congestive heart failure) (HCC)   3. Essential hypertension   4. ICD (implantable cardioverter-defibrillator) in place   5. Pure hypercholesterolemia   6. Bilateral leg pain   7. Type 2 diabetes mellitus with complication, with long-term current use of insulin (HCC)   8. Snuff user   9. Medication management  10. Mild obesity       ASSESSMENT AND PLAN: Ms. Mialani Leake is a 66 -year-old African-American female who suffered a ventricular fibrillation cardiac arrest in June 2005 at which time she was found to have an ejection fraction of 15% and felt to have a nonischemic cardiomyopathy. She has a Orthoptist XT DR defibrillator in place. An echo Doppler study  in July 2013 showed an ejection fraction of 45-50%.  She had been on lisinopril, as needed hydrochlorothiazide in the past.  An echo Doppler study in March 2019 showed an EF of 40 to 45% with mild LV dilation and diffuse hypokinesis.  She did not have significant valvular abnormalities.  In October 2019 she had a generator change out.  There was an option for future CRT upgrade if clinical status deteriorates.  She ultimately was transition to [place]. When seen in March 2022 by Neal Dy.D. she was on Entresto 49/51 mg twice a day, carvedilol 6.25 mg twice a day, furosemide 40 mg daily in addition to atorvastatin 20 mg, Humalog insulin.  When I saw her in July 2022 she had stopped her Entresto.  At that time her blood pressure was stable and I recommended reinstitution at 24/26 mg twice a day and I subsequent visit I recommended titration of Entresto back to 49/51 mg twice a day and added Jardiance for SGLT2 inhibition.  She continues to be on carvedilol 6.25 mg twice a day.  She had been on atorvastatin 10 mg and it was recommended that she further titrate her dose to 20 mg following her LDL cholesterol at 127 in November 2022.   Her last echo Doppler study from March 21, 2021 continues to show  LV dysfunction with a EF at 30 to 35% with global hypocontractility.  She had grade 1 diastolic dysfunction, aortic valve sclerosis, and mild LA dilation.  At her last evaluation she was stable on current therapy.  Her blood pressure today is stable.  ECG shows atrial pacing at 67 bpm.  I have suggested slight adjustment in her carvedilol dose and if tolerated she will increase this to 12.5 mg twice a day.  Most recent laboratory from December 2023 shows total cholesterol 132, triglycerides 54, HDL 52 and LDL 68.  She has experienced leg pain with walking.  I have recommended she undergo arterial lower extremity Doppler assessment.  I again discussed trying to discontinue snuff.  In 6 months, I am recommending she undergo a follow-up echo Doppler study a complete set of laboratory plan to see her for follow-up evaluation of the above studies.    Lennette Bihari, MD, [facility]  06/08/2022 6:44 PM

## 2022-06-08 NOTE — Patient Instructions (Addendum)
Medication Instructions:  Your physician has recommended you make the following change in your medication:  INCREASE: Carvedilol (Coreg) 12.5 mg twice daily  *If you need a refill on your cardiac medications before your next appointment, please call your pharmacy*   Lab Work: Your physician recommends that you return for lab work before next office visit: CMET, CBC, TSH, Lipids If you have labs (blood work) drawn today and your tests are completely normal, you will receive your results only by: MyChart Message (if you have MyChart) OR A paper copy in the mail If you have any lab test that is abnormal or we need to change your treatment, we will call you to review the results.   Testing/Procedures: Your physician has requested that you have an echocardiogram in 6 months. Echocardiography is a painless test that uses sound waves to create images of your heart. It provides your doctor with information about the size and shape of your heart and how well your heart's chambers and valves are working. This procedure takes approximately one hour. There are no restrictions for this procedure. Please do NOT wear cologne, perfume, aftershave, or lotions (deodorant is allowed). Please arrive 15 minutes prior to your appointment time.  Your physician has requested that you have a lower extremity arterial duplex. This test is an ultrasound of the arteries in the legs. It looks at arterial blood flow in the legs. Allow one hour for Lower Arterial scans. There are no restrictions or special instructions      Follow-Up: At [facility], you and your health needs are our priority.  As part of our continuing mission to provide you with exceptional heart care, we have created designated Provider Care Teams.  These Care Teams include your primary Cardiologist (physician) and Advanced Practice Providers (APPs -  Physician Assistants and Nurse Practitioners) who all work together to provide you with the  care you need, when you need it.   Your next appointment:   6 month(s) (Nov/Dec)   Provider:   Nicki Guadalajara, MD

## 2022-06-22 ENCOUNTER — Ambulatory Visit: Payer: Medicare PPO | Attending: Cardiovascular Disease

## 2022-06-22 DIAGNOSIS — Z95810 Presence of automatic (implantable) cardiac defibrillator: Secondary | ICD-10-CM | POA: Diagnosis not present

## 2022-06-22 DIAGNOSIS — I5042 Chronic combined systolic (congestive) and diastolic (congestive) heart failure: Secondary | ICD-10-CM

## 2022-06-25 ENCOUNTER — Telehealth: Payer: Self-pay

## 2022-06-25 NOTE — Progress Notes (Signed)
EPIC Encounter for ICM Monitoring  Patient Name: Diana Campbell is a 77 y.o. female Date: 06/25/2022 Primary Care Physican: Ladon Applebaum Primary Cardiologist: Tresa Endo Electrophysiologist: Croitoru 05/28/2021 Weight: 205 lbs 01/01/2022 Office Weight: 203 lbs   Since 19-May-2022 Time in AT/AF  <0.1 hr/day (<0.1%)                                                            Attempted call to patient and unable to reach.   Transmission reviewed.    Optivol thoracic impedance suggesting intermittent days with possible fluid accumulation within the last month.     Prescribed: No diuretic (Previously on Furosemide 40 mg 1 tablet as needed) Spironolactone 25 mg take 0.5 tablet (12.5 mg total) by mouth daily.   Labs: 12/22/2021 Creatinine 0.93, BUN 13, Potassium 3.9, Sodium 143 10/01/2021 Creatinine 1.0, BUN 16, Potassium 4.7, Sodium 147  07/24/2021 Creatine 1.05, BUN 11, Potassium 4.6, Sodium 143, GFR 55 A complete set of results can be found in Results Review.   Recommendations:  Unable to reach.      Follow-up plan: ICM clinic phone appointment on 07/27/2022.   91 day device clinic remote transmission 07/29/2022.     EP/Cardiology Office Visits:  Recall 03/18/2023 with Dr Royann Shivers.   12/01/2022 with Dr Tresa Endo.     Copy of ICM check sent to Dr. Royann Shivers.   3 month ICM trend: 06/22/2022.    12-14 Month ICM trend:     [name], RN 06/25/2022 10:01 AM

## 2022-06-25 NOTE — Telephone Encounter (Signed)
Remote ICM transmission received.  Attempted call to patient regarding ICM remote transmission and no answer or voice mail option.  

## 2022-06-30 ENCOUNTER — Ambulatory Visit (HOSPITAL_COMMUNITY)
Admission: RE | Admit: 2022-06-30 | Payer: Medicare PPO | Source: Ambulatory Visit | Attending: Cardiovascular Disease | Admitting: Cardiovascular Disease

## 2022-07-17 ENCOUNTER — Other Ambulatory Visit: Payer: Self-pay | Admitting: Cardiovascular Disease

## 2022-07-17 ENCOUNTER — Ambulatory Visit (HOSPITAL_COMMUNITY)
Admission: RE | Admit: 2022-07-17 | Discharge: 2022-07-17 | Disposition: A | Discharge status: Home / Self Care | Payer: Medicare PPO | Source: Ambulatory Visit | Attending: Cardiovascular Disease | Admitting: Cardiovascular Disease

## 2022-07-17 DIAGNOSIS — I10 Essential (primary) hypertension: Secondary | ICD-10-CM

## 2022-07-17 DIAGNOSIS — I739 Peripheral vascular disease, unspecified: Secondary | ICD-10-CM | POA: Insufficient documentation

## 2022-07-17 DIAGNOSIS — Z79899 Other long term (current) drug therapy: Secondary | ICD-10-CM

## 2022-07-17 DIAGNOSIS — M79605 Pain in left leg: Secondary | ICD-10-CM | POA: Diagnosis present

## 2022-07-17 DIAGNOSIS — E7800 Pure hypercholesterolemia, unspecified: Secondary | ICD-10-CM

## 2022-07-17 DIAGNOSIS — Z95810 Presence of automatic (implantable) cardiac defibrillator: Secondary | ICD-10-CM

## 2022-07-17 DIAGNOSIS — M79604 Pain in right leg: Secondary | ICD-10-CM

## 2022-07-17 DIAGNOSIS — E669 Obesity, unspecified: Secondary | ICD-10-CM

## 2022-07-17 DIAGNOSIS — I428 Other cardiomyopathies: Secondary | ICD-10-CM

## 2022-07-17 DIAGNOSIS — I5042 Chronic combined systolic (congestive) and diastolic (congestive) heart failure: Secondary | ICD-10-CM

## 2022-07-17 DIAGNOSIS — Z794 Long term (current) use of insulin: Secondary | ICD-10-CM

## 2022-07-17 DIAGNOSIS — Z720 Tobacco use: Secondary | ICD-10-CM

## 2022-07-19 LAB — VAS US ABI WITH/WO TBI

## 2022-07-22 ENCOUNTER — Telehealth: Payer: Self-pay | Admitting: *Deleted

## 2022-07-22 ENCOUNTER — Other Ambulatory Visit: Payer: Self-pay | Admitting: "Endocrinology

## 2022-07-22 DIAGNOSIS — E118 Type 2 diabetes mellitus with unspecified complications: Secondary | ICD-10-CM

## 2022-07-22 NOTE — Telephone Encounter (Signed)
-----   Message from Iran Ouch, MD sent at 07/21/2022  4:53 PM EDT ----- I am happy to see her for consultation if she is having significant leg pain.  ----- Message ----- From: Shon Hale, CMA Sent: 07/21/2022   4:18 PM EDT To: Runell Gess, MD; Iran Ouch, MD  Spoke with Diana Campbell on how to explain TK's note to pt regarding echo results. Told to forward for follow up instructions for patient. Having really bad leg pain.

## 2022-07-22 NOTE — Telephone Encounter (Signed)
Left a message for the patient to call back.  

## 2022-07-27 ENCOUNTER — Ambulatory Visit: Payer: Medicare PPO | Attending: Cardiovascular Disease

## 2022-07-27 DIAGNOSIS — I5042 Chronic combined systolic (congestive) and diastolic (congestive) heart failure: Secondary | ICD-10-CM | POA: Diagnosis not present

## 2022-07-27 DIAGNOSIS — Z95810 Presence of automatic (implantable) cardiac defibrillator: Secondary | ICD-10-CM | POA: Diagnosis not present

## 2022-07-28 NOTE — Telephone Encounter (Signed)
Left a message for the patient to call back to speak to Dr. Landry Dyke MA about the results.

## 2022-07-29 ENCOUNTER — Ambulatory Visit (INDEPENDENT_AMBULATORY_CARE_PROVIDER_SITE_OTHER): Payer: Medicare PPO

## 2022-07-29 DIAGNOSIS — I428 Other cardiomyopathies: Secondary | ICD-10-CM

## 2022-07-29 LAB — CUP PACEART REMOTE DEVICE CHECK
Battery Remaining Longevity: 44 mo
Battery Voltage: 2.97 V
Brady Statistic AP VP Percent: 0.26 %
Brady Statistic AP VS Percent: 83.3 %
Brady Statistic AS VP Percent: 0.04 %
Brady Statistic AS VS Percent: 16.41 %
Brady Statistic RA Percent Paced: 75.6 %
Brady Statistic RV Percent Paced: 0.29 %
Date Time Interrogation Session: 20240710043824
HighPow Impedance: 43 Ohm
HighPow Impedance: 54 Ohm
Implantable Lead Connection Status: 753985
Implantable Lead Connection Status: 753985
Implantable Lead Implant Date: 20120702
Implantable Lead Implant Date: 20120702
Implantable Lead Location: 753859
Implantable Lead Location: 753860
Implantable Lead Model: 5076
Implantable Lead Model: 6947
Implantable Pulse Generator Implant Date: 20191011
Lead Channel Impedance Value: 342 Ohm
Lead Channel Impedance Value: 437 Ohm
Lead Channel Impedance Value: 456 Ohm
Lead Channel Pacing Threshold Amplitude: 0.75 V
Lead Channel Pacing Threshold Amplitude: 0.75 V
Lead Channel Pacing Threshold Pulse Width: 0.4 ms
Lead Channel Pacing Threshold Pulse Width: 0.4 ms
Lead Channel Sensing Intrinsic Amplitude: 2.375 mV
Lead Channel Sensing Intrinsic Amplitude: 2.375 mV
Lead Channel Sensing Intrinsic Amplitude: 9 mV
Lead Channel Sensing Intrinsic Amplitude: 9 mV
Lead Channel Setting Pacing Amplitude: 1.5 V
Lead Channel Setting Pacing Amplitude: 2 V
Lead Channel Setting Pacing Pulse Width: 0.4 ms
Lead Channel Setting Sensing Sensitivity: 0.3 mV
Zone Setting Status: 755011
Zone Setting Status: 755011

## 2022-07-29 NOTE — Telephone Encounter (Signed)
Pt returning call for results

## 2022-07-31 NOTE — Progress Notes (Signed)
EPIC Encounter for ICM Monitoring  Patient Name: Diana Campbell is a 77 y.o. female Date: 07/31/2022 Primary Care Physican: Ladon Applebaum Primary Cardiologist: Tresa Endo Electrophysiologist: Croitoru 05/28/2021 Weight: 205 lbs 01/01/2022 Office Weight: 203 lbs   Since 19-May-2022 Time in AT/AF  <0.1 hr/day (<0.1%)                                                             Transmission reviewed.    Optivol thoracic impedance suggesting normal fluid levels within the last month.     Prescribed: No diuretic (Previously on Furosemide 40 mg 1 tablet as needed) Spironolactone 25 mg take 0.5 tablet (12.5 mg total) by mouth daily.   Labs: 12/22/2021 Creatinine 0.93, BUN 13, Potassium 3.9, Sodium 143 10/01/2021 Creatinine 1.0, BUN 16, Potassium 4.7, Sodium 147  07/24/2021 Creatine 1.05, BUN 11, Potassium 4.6, Sodium 143, GFR 55 A complete set of results can be found in Results Review.   Recommendations:  No changes.   Follow-up plan: ICM clinic phone appointment on 08/31/2022.   91 day device clinic remote transmission 10/28/2022.     EP/Cardiology Office Visits:  Recall 03/18/2023 with Dr Royann Shivers.   12/01/2022 with Dr Tresa Endo.     Copy of ICM check sent to Dr. Royann Shivers.    3 month ICM trend: 07/29/2022.    12-14 Month ICM trend:     [name], RN 07/31/2022 1:27 PM

## 2022-08-05 ENCOUNTER — Telehealth: Payer: Self-pay | Admitting: Cardiovascular Disease

## 2022-08-05 NOTE — Telephone Encounter (Signed)
Pt reports that she continues to have pain in her legs- can hardly walk in the mornings. She was called and given results of test 07/22/22, but reports that she was never given any further instructions of what the next step is for her, based on her results. She has called several times and no one has gotten back in touch with her. She knows that Dr Tresa Endo is not in the office, but she is just worried about the blood flow in her legs. Does she need a referral to PV or any further testing or procedures. Is this leg pain from claudication or is some of it possibly due to her taking statins?  I looked into getting her an appt with APP this week, but nothing available before her daughter leaves from her visit. (Her daughter would bring her)

## 2022-08-05 NOTE — Telephone Encounter (Signed)
Patient is requesting call back to go over results.  

## 2022-08-06 ENCOUNTER — Telehealth: Payer: Self-pay | Admitting: Cardiovascular Disease

## 2022-08-06 DIAGNOSIS — M79605 Pain in left leg: Secondary | ICD-10-CM

## 2022-08-06 DIAGNOSIS — R6889 Other general symptoms and signs: Secondary | ICD-10-CM

## 2022-08-06 NOTE — Telephone Encounter (Signed)
PV consult ordered on 08/06/22 by Dr. Tresa Endo.  Pt scheduled for appointment with Dr. Kirke Corin on 08/11/22 at 10:00am. See phone note from 08/06/22 for details.

## 2022-08-06 NOTE — Telephone Encounter (Signed)
Sent message to Dr. Tresa Endo for recommendations. Will follow-up with patient after recommendations received.

## 2022-08-06 NOTE — Telephone Encounter (Signed)
Follow Up:     Patient said she was told to call back today @ 12:00 tosee if her test had been read.

## 2022-08-06 NOTE — Telephone Encounter (Signed)
Discussed with Dr. Tresa Endo, who gave verbal order for [facility] consult. Order entered.   Explained Dr. Landry Dyke recommendation to patient. Patient accepted appointment on 08/11/22 at 10:00am with Dr. Kirke Corin. She verbalized appreciation of call and denies additional questions at this time.

## 2022-08-06 NOTE — Telephone Encounter (Signed)
Patient would like to speak to you about her doppler done in June.  She states she still having pain in her legs. She stated she did not understand your voicemail.

## 2022-08-11 ENCOUNTER — Ambulatory Visit: Payer: Medicare PPO | Attending: Cardiovascular Disease | Admitting: Cardiovascular Disease

## 2022-08-11 VITALS — BP 118/60 | HR 105 | Ht 68.0 in | Wt 205.0 lb

## 2022-08-11 DIAGNOSIS — I5022 Chronic systolic (congestive) heart failure: Secondary | ICD-10-CM

## 2022-08-11 DIAGNOSIS — Z720 Tobacco use: Secondary | ICD-10-CM

## 2022-08-11 DIAGNOSIS — I739 Peripheral vascular disease, unspecified: Secondary | ICD-10-CM

## 2022-08-11 DIAGNOSIS — E785 Hyperlipidemia, unspecified: Secondary | ICD-10-CM

## 2022-08-11 NOTE — Patient Instructions (Signed)

## 2022-08-11 NOTE — Progress Notes (Signed)
Cardiology Office Note   Date:  08/11/2022   [name], Diana Campbell Oct 28, 1945, MRN [id number]  PCP:  Avis Epley, PA-C  Cardiologist:  Dr. Tresa Endo  Chief Complaint  Patient presents with   Fatigue      History of Present Illness: Diana Campbell is a 77 y.o. female who was referred by Dr. Tresa Endo for evaluation management of peripheral arterial disease. She has history of cardiac arrest in 2005 with subsequent diagnosis of nonischemic cardiomyopathy.  Cardiac catheterization showed mild nonobstructive coronary artery disease.  She had an ICD placement at that time.  Other medical problems include type 2 diabetes, essential hypertension, hyperlipidemia and sleep apnea.  She has prolonged history of severe low back pain that radiates to both lower extremities all the way down to her knees.  She denies any discomfort in her feet.  She underwent lower extremity arterial Doppler studies in June which showed noncompressible vessels with abnormal TBI.  Duplex showed occluded anterior tibial artery on the right with significant stenosis in the mid PTA.  On the left, the anterior tibial was occluded distally.  She had previous CT lumbar spine that showed evidence of spinal stenosis.  She reports that she used to receive steroid injections with improvements but they stopped working.  She refused to consider surgery.   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   CARDIAC CATHETERIZATION  04/14/2000   Normal coronary arteries.   CARDIAC DEFIBRILLATOR PLACEMENT  07/21/2010   Medtronic Protecta XT DR, model#[id number], serial#[id number]   CARDIOVASCULAR STRESS TEST  04/05/2000   Scintigraphic evidence of mild LV dilatation and global LV dysfunction with an EF 41% with mild inferolateral ischemia.   CARPAL TUNNEL RELEASE Right 01/23/2016   Procedure: CARPAL TUNNEL RELEASE;  Surgeon: Vickki Hearing, MD;  Location: [facility];  Service: Orthopedics;  Laterality: Right;  pt knows to arrive at 6:15   COLONOSCOPY  03/30/2011   Procedure: COLONOSCOPY;  Surgeon: Dalia Heading, MD;  Location: [facility];  Service: Gastroenterology;  Laterality: N/A;   DORSAL COMPARTMENT RELEASE Right 08/11/2013   Procedure: RELEASE DORSAL COMPARTMENT (DEQUERVAIN);  Surgeon: Vickki Hearing, MD;  Location: [facility];  Service: Orthopedics;  Laterality: Right;   ICD GENERATOR CHANGEOUT N/A 10/29/2017   Procedure: ICD GENERATOR CHANGEOUT;  Surgeon: Thurmon Fair, MD;  Location: [facility];  Service: Cardiovascular;  Laterality: N/A;   TRANSTHORACIC ECHOCARDIOGRAM  08/03/2011   EF 45-50%, mildly reduced LV systolic function.     Current Outpatient Medications  Medication Sig Dispense Refill   ACCU-CHEK FASTCLIX LANCETS MISC 1 each by Does not apply route 2 (two) times daily. 200 each 5   ACCU-CHEK GUIDE test strip USE AS INSTRUCTED TO TEST BLOOD GLUCOSE FOUR TIMES DAILY 400 strip 1   acetaminophen (TYLENOL) 325 MG tablet Take 650 mg by mouth in the morning and at bedtime.     aspirin EC 81 MG  tablet Take 1 tablet (81 mg total) by mouth daily. 90 tablet 3   atorvastatin (LIPITOR) 10 MG tablet TAKE 1 TABLET BY MOUTH EVERY DAY 90 tablet 3   atorvastatin (LIPITOR) 20 MG tablet TAKE 1 TABLET BY MOUTH EVERY DAY 90 tablet 1   benzonatate (TESSALON) 200 MG capsule Take 200 mg by mouth 3 (three) times daily.     Blood Glucose Monitoring Suppl  (ACCU-CHEK GUIDE) w/Device KIT Use as directed to check blood glucose four times daily 1 kit 0   carvedilol (COREG) 12.5 MG tablet Take 1 tablet (12.5 mg total) by mouth 2 (two) times daily. 180 tablet 3   ENTRESTO 49-51 MG TAKE 1 TABLET BY MOUTH TWICE A DAY 60 tablet 8   fluconazole (DIFLUCAN) 150 MG tablet Take 150 mg by mouth daily.     Insulin Lispro Prot & Lispro (HUMALOG 75/25 MIX) (75-25) 100 UNIT/ML Kwikpen Inject 20 units before breakfast and 15 units with supper only if glucose readings are above 90 and eating. 30 mL 1   JARDIANCE 10 MG TABS tablet TAKE 1 TABLET BY MOUTH EVERY DAY 90 tablet 3   Lancets Misc. (ACCU-CHEK FASTCLIX LANCET) KIT 1 each by Does not apply route 2 (two) times daily. 1 kit 2   amoxicillin-clavulanate (AUGMENTIN) 875-125 MG tablet Take by mouth 2 (two) times daily. (Patient not taking: Reported on 08/11/2022)     spironolactone (ALDACTONE) 25 MG tablet TAKE 1/2 TABLET BY MOUTH EVERY DAY (Patient not taking: Reported on 08/11/2022) 45 tablet 11   No current facility-administered medications for this visit.    Allergies:   Patient has no known allergies.    Social History:  The patient  reports that she has never smoked. Her smokeless tobacco use includes snuff. She reports that she does not drink alcohol and does not use drugs.   Family History:  The patient's family history is not on file.    ROS:  Please see the history of present illness.   Otherwise, review of systems are positive for none.   All other systems are reviewed and negative.    PHYSICAL EXAM: VS:  BP 118/60 (BP Location: Left Arm, Patient Position: Sitting, Cuff Size: Normal)   Pulse (!) 105   Ht 5\' 8"  (1.727 m)   Wt 205 lb (93 kg)   SpO2 95%   BMI 31.17 kg/m  , BMI Body mass index is 31.17 kg/m. GEN: Well nourished, well developed, in no acute distress  HEENT: normal  Neck: no JVD, carotid bruits, or masses Cardiac: RRR; no murmurs, rubs, or gallops,no edema  Respiratory:  clear to  auscultation bilaterally, normal work of breathing GI: soft, nontender, nondistended, + BS MS: no deformity or atrophy  Skin: warm and dry, no rash Neuro:  Strength and sensation are intact Psych: euthymic mood, full affect Distal pulses are not palpable.  No ulcerations.  EKG:  EKG is not ordered today.    Recent Labs: 12/22/2021: ALT 6; BUN 13; Creatinine, Ser 0.93; Potassium 3.9; Sodium 143; TSH 1.190    Lipid Panel    Component Value Date/Time   CHOL 132 12/22/2021 0759   TRIG 54 12/22/2021 0759   HDL 52 12/22/2021 0759   CHOLHDL 2.5 12/22/2021 0759   CHOLHDL 3.5 08/28/2019 0851   VLDL 14 09/26/2015 0856   LDLCALC 68 12/22/2021 0759   LDLCALC 141 (H) 08/28/2019 0851      Wt Readings from Last 3 Encounters:  08/11/22 205 lb (93 kg)  06/08/22  194 lb 12.8 oz (88.4 kg)  05/05/22 200 lb 12.8 oz (91.1 kg)           No data to display            ASSESSMENT AND PLAN:  1.  Peripheral arterial disease: The patient has evidence of bilateral tibial disease but she denies foot discomfort.  The majority of her symptoms seem to be related to pseudoclaudication and spinal stenosis.  Thus, there is currently no indication for revascularization.  Continue treatment of risk factors.  I discussed with her the importance of proper foot hygiene and avoidance of injuries.  Consider podiatry care especially that she is diabetic.  2.  Chronic systolic heart failure: Continue medical therapy.  She appears to be euvolemic.  3.  Hyperlipidemia: Continue atorvastatin with a target LDL of less than 70.  4.  Tobacco use: I discussed the importance of stopping snuff    Disposition:   FU with me in 1 year  Signed,  Lorine Bears, MD  08/11/2022 10:24 AM    [facility]

## 2022-08-19 NOTE — Progress Notes (Signed)
Remote ICD transmission.   

## 2022-08-31 ENCOUNTER — Ambulatory Visit: Payer: Medicare PPO

## 2022-08-31 DIAGNOSIS — Z95810 Presence of automatic (implantable) cardiac defibrillator: Secondary | ICD-10-CM

## 2022-08-31 DIAGNOSIS — I5022 Chronic systolic (congestive) heart failure: Secondary | ICD-10-CM | POA: Diagnosis not present

## 2022-09-02 ENCOUNTER — Telehealth: Payer: Self-pay

## 2022-09-02 NOTE — Telephone Encounter (Signed)
Left a message requesting pt to return call to the office. 

## 2022-09-04 ENCOUNTER — Telehealth: Payer: Self-pay

## 2022-09-04 ENCOUNTER — Ambulatory Visit: Payer: Medicare PPO | Admitting: "Endocrinology

## 2022-09-04 ENCOUNTER — Encounter: Payer: Self-pay | Admitting: "Endocrinology

## 2022-09-04 VITALS — BP 136/78 | HR 68 | Ht 68.0 in | Wt 202.4 lb

## 2022-09-04 DIAGNOSIS — Z794 Long term (current) use of insulin: Secondary | ICD-10-CM | POA: Diagnosis not present

## 2022-09-04 DIAGNOSIS — E782 Mixed hyperlipidemia: Secondary | ICD-10-CM | POA: Diagnosis not present

## 2022-09-04 DIAGNOSIS — I10 Essential (primary) hypertension: Secondary | ICD-10-CM | POA: Diagnosis not present

## 2022-09-04 DIAGNOSIS — E119 Type 2 diabetes mellitus without complications: Secondary | ICD-10-CM | POA: Diagnosis not present

## 2022-09-04 LAB — POCT GLYCOSYLATED HEMOGLOBIN (HGB A1C): HbA1c, POC (controlled diabetic range): 6 % (ref 0.0–7.0)

## 2022-09-04 MED ORDER — INSULIN LISPRO PROT & LISPRO (75-25 MIX) 100 UNIT/ML KWIKPEN: PEN_INJECTOR | SUBCUTANEOUS | 1 refills | Status: DC

## 2022-09-04 NOTE — Progress Notes (Signed)
EPIC Encounter for ICM Monitoring  Patient Name: Diana Campbell is a 77 y.o. female Date: 09/04/2022 Primary Care Physican: Ladon Applebaum Primary Cardiologist: Tresa Endo Electrophysiologist: Croitoru 05/28/2021 Weight: 205 lbs 01/01/2022 Office Weight: 203 lbs   Since 29-Jul-2022 Time in AT/AF  <0.1 hr/day (<0.1%)                                                             Attempted call to patient and unable to reach.   Transmission reviewed.     Optivol thoracic impedance suggesting normal fluid levels within the last month.     Prescribed: No diuretic (Previously on Furosemide 40 mg 1 tablet as needed) Spironolactone 25 mg take 0.5 tablet (12.5 mg total) by mouth daily.   Labs: 12/22/2021 Creatinine 0.93, BUN 13, Potassium 3.9, Sodium 143 10/01/2021 Creatinine 1.0, BUN 16, Potassium 4.7, Sodium 147  07/24/2021 Creatine 1.05, BUN 11, Potassium 4.6, Sodium 143, GFR 55 A complete set of results can be found in Results Review.   Recommendations:  Unable to reach.     Follow-up plan: ICM clinic phone appointment on 10/05/2022.   91 day device clinic remote transmission 10/28/2022.     EP/Cardiology Office Visits:  Recall 03/18/2023 with Dr Royann Shivers.   12/01/2022 with Dr Tresa Endo.     Copy of ICM check sent to Dr. Royann Shivers.   3 month ICM trend: 08/31/2022.    12-14 Month ICM trend:     [name], RN 09/04/2022 10:08 AM

## 2022-09-04 NOTE — Progress Notes (Signed)
09/04/2022  Endocrinology follow-up note   Subjective:    Patient ID: Diana Campbell, female    DOB: 1945/08/05, PCP Avis Epley, PA-C   Past Medical History:  Diagnosis Date   Arthritis    Automatic implantable cardioverter-defibrillator in situ 07/2010   Cardiac abnormality    with defibrilator   Chronic combined systolic and diastolic CHF (congestive heart failure) (HCC) 09/04/2015   Diabetes mellitus    H/O cardiac arrest 2006   Hypercholesteremia    Hypertension    Neuropathy    Past Surgical History:  Procedure Laterality Date   CARDIAC CATHETERIZATION  06/22/2003   Severe nonischemic cardiomyopathy, 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   CARDIAC CATHETERIZATION  04/14/2000   Normal coronary arteries.   CARDIAC DEFIBRILLATOR PLACEMENT  07/21/2010   Medtronic Protecta XT DR, model#[id number], serial#[id number]   CARDIOVASCULAR STRESS TEST  04/05/2000   Scintigraphic evidence of mild LV dilatation and global LV dysfunction with an EF 41% with mild inferolateral ischemia.   CARPAL TUNNEL RELEASE Right 01/23/2016   Procedure: CARPAL TUNNEL RELEASE;  Surgeon: Vickki Hearing, MD;  Location: [facility];  Service: Orthopedics;  Laterality: Right;  pt knows to arrive at 6:15   COLONOSCOPY  03/30/2011   Procedure: COLONOSCOPY;  Surgeon: Dalia Heading, MD;  Location: [facility];  Service: Gastroenterology;  Laterality: N/A;   DORSAL COMPARTMENT RELEASE Right 08/11/2013   Procedure: RELEASE DORSAL COMPARTMENT (DEQUERVAIN);  Surgeon: Vickki Hearing, MD;  Location: [facility];  Service: Orthopedics;  Laterality: Right;   ICD GENERATOR CHANGEOUT N/A 10/29/2017   Procedure: ICD GENERATOR CHANGEOUT;  Surgeon: Thurmon Fair, MD;  Location: [facility];  Service: Cardiovascular;  Laterality: N/A;   TRANSTHORACIC ECHOCARDIOGRAM  08/03/2011   EF 45-50%, mildly reduced LV systolic function.    Social History   Socioeconomic History   Marital status: Married    Spouse name: Not on file   Number of children: Not on file   Years of education: Not on file   Highest education level: Not on file  Occupational History   Not on file  Tobacco Use   Smoking status: Never   Smokeless tobacco: Current    Types: Snuff   Tobacco comments:    3 dips per day  Vaping Use   Vaping status: Never Used  Substance and Sexual Activity   Alcohol use: No   Drug use: No   Sexual activity: Yes    Birth control/protection: None  Other Topics Concern   Not on file  Social History Narrative   Not on file   Social Determinants of Health   Financial Resource Strain: Not on file  Food Insecurity: Not on file  Transportation Needs: Not on file  Physical Activity: Not on file  Stress: Not on file  Social Connections: Not on file   Outpatient Encounter Medications as of 09/04/2022  Medication Sig   ACCU-CHEK FASTCLIX LANCETS MISC 1 each by Does not apply route 2 (two) times daily.   ACCU-CHEK GUIDE test strip USE AS INSTRUCTED TO TEST BLOOD GLUCOSE FOUR TIMES DAILY   acetaminophen (TYLENOL) 325 MG tablet Take 650 mg by mouth in the morning and at bedtime.   amoxicillin-clavulanate (AUGMENTIN) 875-125 MG tablet Take by mouth 2 (two) times daily. (Patient not taking: Reported on 08/11/2022)   aspirin EC 81 MG tablet Take 1 tablet (81 mg total) by mouth daily.   atorvastatin (  LIPITOR) 10 MG tablet TAKE 1 TABLET BY MOUTH EVERY DAY   atorvastatin (LIPITOR) 20 MG tablet TAKE 1 TABLET BY MOUTH EVERY DAY   benzonatate (TESSALON) 200 MG capsule Take 200 mg by mouth 3 (three) times daily.   Blood Glucose Monitoring Suppl (ACCU-CHEK GUIDE) w/Device KIT Use as directed to check blood glucose four times daily   carvedilol (COREG) 12.5 MG tablet Take 1 tablet (12.5 mg total) by mouth 2 (two) times daily.   ENTRESTO 49-51 MG TAKE 1 TABLET BY MOUTH TWICE A DAY   fluconazole (DIFLUCAN) 150 MG tablet Take  150 mg by mouth daily.   Insulin Lispro Prot & Lispro (HUMALOG 75/25 MIX) (75-25) 100 UNIT/ML Kwikpen Inject 15 units before breakfast and 15 units with supper only if glucose readings are above 90 and eating.   JARDIANCE 10 MG TABS tablet TAKE 1 TABLET BY MOUTH EVERY DAY   Lancets Misc. (ACCU-CHEK FASTCLIX LANCET) KIT 1 each by Does not apply route 2 (two) times daily.   spironolactone (ALDACTONE) 25 MG tablet TAKE 1/2 TABLET BY MOUTH EVERY DAY (Patient not taking: Reported on 08/11/2022)   [DISCONTINUED] Insulin Lispro Prot & Lispro (HUMALOG 75/25 MIX) (75-25) 100 UNIT/ML Kwikpen Inject 20 units before breakfast and 15 units with supper only if glucose readings are above 90 and eating. (Patient taking differently: Inject 15 units before breakfast and 20 units with supper only if glucose readings are above 90 and eating.)   No facility-administered encounter medications on file as of 09/04/2022.   ALLERGIES: No Known Allergies VACCINATION STATUS: Immunization History  Administered Date(s) Administered   Influenza-Unspecified 11/19/2017    Diabetes She is returns for follow-up in the management of her currently uncontrolled type 2 diabetes, hyperlipidemia, hypertension.   Onset time: She was diagnosed with diabetes at approximately 77 years of age.  She is responding to her current insulin regimen. She is currently on Humalog 75/25 15 units with breakfast and 20 units with supper.  She is also on Jardiance 10 mg p.o. daily at breakfast.  She presents with controlled glycemic profile to near target levels, point-of-care A1c of 6% today.   No major hypoglycemia is documented or reported.    She has done relatively better on premixed insulin twice a day.  She tolerates this medication very well, complains that she is not affording the Jardiance and will not continue with it after she finishes her current supplies.     Jardiance was started by her cardiologist.   Pertinent negatives for  hypoglycemia include no confusion, headaches, pallor or seizures. There are no diabetic associated symptoms. Pertinent negatives for diabetes include no chest pain, no polydipsia, no polyphagia and no polyuria. There are no hypoglycemic complications. Symptoms are worsening. Diabetic complications include nephropathy. Risk factors for coronary artery disease include diabetes mellitus, dyslipidemia, hypertension, sedentary lifestyle and tobacco exposure. Current diabetic treatment includes premixed insulin twice daily associated with monitoring of blood glucose at least 2 times a day.   She did not document any major hypoglycemia. Her weight is stable, she complains of poor appetite. She is following a generally unhealthy diet, regularly eats "junk food".  She has not had a previous visit with a dietitian (She declined a referral to CDE.)  She has uncontrolled dyslipidemia despite Lipitor 10 mg p.o. daily.  An ACE inhibitor/angiotensin II receptor blocker is being taken. Eye exam is current.    Hyperlipidemia This is a chronic problem. The current episode started more than 1 year ago. Pertinent negatives  include no chest pain, myalgias or shortness of breath. Current antihyperlipidemic treatment includes statins. Risk factors for coronary artery disease include dyslipidemia, diabetes mellitus and a sedentary lifestyle.  Her previsit labs show LDL at 127, despite Lipitor 10 mg p.o. daily. Hypertension This is a chronic problem. The current episode started more than 1 year ago. The problem is controlled. Pertinent negatives include no chest pain, headaches, palpitations or shortness of breath. Past treatments include ACE inhibitors. Hypertensive end-organ damage includes kidney disease.    Review of systems: Limited as above.   Objective:    BP 136/78   Pulse 68   Ht 5\' 8"  (1.727 m)   Wt 202 lb 6.4 oz (91.8 kg)   BMI 30.77 kg/m   Wt Readings from Last 3 Encounters:  09/04/22 202 lb 6.4 oz (91.8  kg)  08/11/22 205 lb (93 kg)  06/08/22 194 lb 12.8 oz (88.4 kg)     Physical Exam- Limited  Constitutional:  Body mass index is 30.77 kg/m. , not in acute distress, normal state of mind Eyes:  EOMI, no exophthalmos       Latest Ref Rng & Units 08/25/2022    8:49 AM 12/22/2021    7:59 AM 10/01/2021   12:00 AM  CMP  Glucose 70 - 99 mg/dL 161  096    BUN 8 - 27 mg/dL 16  13  16       Creatinine 0.57 - 1.00 mg/dL 0.45  4.09  1.0      Sodium 134 - 144 mmol/L 144  143  147      Potassium 3.5 - 5.2 mmol/L 4.3  3.9  4.7      Chloride 96 - 106 mmol/L 108  106  109      CO2 20 - 29 mmol/L 21  24  19       Calcium 8.7 - 10.3 mg/dL 9.4  9.5  9.6      Total Protein 6.0 - 8.5 g/dL 6.5  6.5    Total Bilirubin 0.0 - 1.2 mg/dL 0.4  0.5    Alkaline Phos 44 - 121 IU/L 76  84  93      AST 0 - 40 IU/L 13  13  13       ALT 0 - 32 IU/L 9  6  6          This result is from an external source.    Lab Results  Component Value Date   HGBA1C 6.0 09/04/2022   HGBA1C 6.5 05/05/2022   HGBA1C 7.0 01/01/2022   MICROALBUR 0.7 08/28/2019   MICROALBUR 0.3 04/14/2016   Lipid Panel     Component Value Date/Time   CHOL 122 08/25/2022 0849   TRIG 58 08/25/2022 0849   HDL 54 08/25/2022 0849   CHOLHDL 2.3 08/25/2022 0849   CHOLHDL 3.5 08/28/2019 0851   VLDL 14 09/26/2015 0856   LDLCALC 55 08/25/2022 0849   LDLCALC 141 (H) 08/28/2019 0851     Assessment & Plan:   1. Type 2 diabetes mellitus with long-term current use of insulin (HCC)  -Her diabetes is complicated by cardiomyopathy and she remains at a high risk for more acute and chronic complications of diabetes which include CAD, CVA, CKD, retinopathy, and neuropathy. These are all discussed in detail with the patient.  Patient presents with tightly controlled glycemic profile with point-of-care A1c of 6%.  He did not document or report major hypoglycemia.     Recent labs reviewed.   -  I have re-counseled the patient on diet management and  weight loss  by adopting a carbohydrate restricted / protein rich  Diet.  - she acknowledges that there is a room for improvement in her food and drink choices. - Suggestion is made for her to avoid simple carbohydrates  from her diet including Cakes, Sweet Desserts, Ice Cream, Soda (diet and regular), Sweet Tea, Candies, Chips, Cookies, Store Bought Juices, Alcohol , Artificial Sweeteners,  Coffee Creamer, and "Sugar-free" Products, Lemonade. This will help patient to have more stable blood glucose profile and potentially avoid unintended weight gain.  The following Lifestyle Medicine recommendations according to American College of Lifestyle Medicine  [facility]) were discussed and and offered to patient and she  agrees to start the journey:  A. Whole Foods, Plant-Based Nutrition comprising of fruits and vegetables, plant-based proteins, whole-grain carbohydrates was discussed in detail with the patient.   A list for source of those nutrients were also provided to the patient.  Patient will use only water or unsweetened tea for hydration. B.  The need to stay away from risky substances including alcohol, smoking; obtaining 7 to 9 hours of restorative sleep, at least 150 minutes of moderate intensity exercise weekly, the importance of healthy social connections,  and stress management techniques were discussed. C.  A full color page of  Calorie density of various food groups per pound showing examples of each food groups was provided to the patient.   - Patient is advised to stick to a routine mealtimes to eat 3 meals a day and avoid unnecessary snacks specially with ultra processed foods.    - The patient  declined referral to a  CDE for individualized DM education.  - I have approached patient with the following individualized plan to manage diabetes and patient agrees.   - Patient  worries a lot about hypoglycemia. -She has done very well on premixed insulin.  In the interest of avoiding  hypoglycemia, she is advised to lower her Humalog 75/25 to 15 units with breakfast and 15 units with supper for Premeal blood glucose readings above 90 mg per DL.  She is advised to continue with her current supplies of Jardiance 10 mg p.o. daily at breakfast.  She is complaining of the cost of this medication and will not refill it.  This medication was prescribed by her cardiologist.     -She is advised and agrees to continue monitoring of blood glucose 4 times a day-before meals and at bedtime.    -Patient is encouraged to call clinic for blood glucose levels less than 70 or above 200 mg /dl.  -Her most recent labs show normal renal function.  She would have benefited from low-dose metformin, however she wishes to avoid it at this time.    - Patient specific target  for A1c; LDL, HDL, Triglycerides,were discussed in detail.  2) BP/HTN:  -Her blood pressure is controlled to target.  She is currently on carvedilol 12.5 mg p.o. twice daily, spironolactone 25 mg p.o. once a day.   3) Lipids/HPL: Recent lipid panel showed improved cholesterol profile with LDL at 55 improving from 127.  She is advised to continue Lipitor 20 mg p.o. nightly.   She will have repeat labs before her next visit.    The above detailed plan predominant lifestyle medicine nutrition will help with dyslipidemia besides type 2 diabetes.    4)  Weight/Diet: Her BMI 30.77-losing weight.   This is complicating her diabetes care.  She is  a candidate for modest weight loss.    She declined CDE consult . exercise, and carbohydrates information provided.  5) Chronic Care/Health Maintenance:  -Patient is on ACEI/ARB and Statin medications and encouraged to continue to follow up with Ophthalmology, Podiatrist at least yearly or according to recommendations, and advised to  stay away from smoking. I have recommended yearly flu vaccine and pneumonia vaccination at least every 5 years; moderate intensity exercise for up to 150 minutes  weekly; and  sleep for at least 7 hours a day.  -She was not ready for foot exam today.  She will be approached again.   - I advised patient to maintain close follow up with Avis Epley, PA-C for primary care needs.   I spent  26  minutes in the care of the patient today including review of labs from CMP, Lipids, Thyroid Function, Hematology (current and previous including abstractions from other facilities); face-to-face time discussing  her blood glucose readings/logs, discussing hypoglycemia and hyperglycemia episodes and symptoms, medications doses, her options of short and long term treatment based on the latest standards of care / guidelines;  discussion about incorporating lifestyle medicine;  and documenting the encounter. Risk reduction counseling performed per USPSTF guidelines to reduce  obesity and cardiovascular risk factors.     Please refer to Patient Instructions for Blood Glucose Monitoring and Insulin/Medications Dosing Guide"  in media tab for additional information. Please  also refer to " Patient Self Inventory" in the Media  tab for reviewed elements of pertinent patient history.  Sharah Jana Half participated in the discussions, expressed understanding, and voiced agreement with the above plans.  All questions were answered to her satisfaction. she is encouraged to contact clinic should she have any questions or concerns prior to her return visit.   Follow up plan: -Return in about 6 months (around 03/07/2023) for F/U with Pre-visit Labs, Meter/CGM/Logs, A1c here.  Marquis Lunch, MD Phone: [phone]  Fax: [phone]  -  This note was partially dictated with voice recognition software. Similar sounding words can be transcribed inadequately or may not  be corrected upon review.  09/04/2022, 9:46 AM

## 2022-09-04 NOTE — Patient Instructions (Signed)

## 2022-09-04 NOTE — Telephone Encounter (Signed)
Remote ICM transmission received.  Attempted call to patient regarding ICM remote transmission and no answer.  

## 2022-09-08 ENCOUNTER — Other Ambulatory Visit (HOSPITAL_COMMUNITY): Payer: Self-pay | Admitting: Internal Medicine

## 2022-09-08 DIAGNOSIS — M545 Low back pain, unspecified: Secondary | ICD-10-CM

## 2022-09-08 DIAGNOSIS — M48062 Spinal stenosis, lumbar region with neurogenic claudication: Secondary | ICD-10-CM

## 2022-09-17 ENCOUNTER — Ambulatory Visit (HOSPITAL_COMMUNITY)
Admission: RE | Admit: 2022-09-17 | Discharge: 2022-09-17 | Disposition: A | Discharge status: Home / Self Care | Payer: Medicare PPO | Source: Ambulatory Visit | Attending: Internal Medicine | Admitting: Internal Medicine

## 2022-09-17 DIAGNOSIS — M545 Low back pain, unspecified: Secondary | ICD-10-CM | POA: Diagnosis present

## 2022-09-17 DIAGNOSIS — M48062 Spinal stenosis, lumbar region with neurogenic claudication: Secondary | ICD-10-CM | POA: Insufficient documentation

## 2022-10-05 ENCOUNTER — Ambulatory Visit: Payer: Medicare PPO | Attending: Cardiovascular Disease

## 2022-10-05 DIAGNOSIS — Z95810 Presence of automatic (implantable) cardiac defibrillator: Secondary | ICD-10-CM

## 2022-10-05 DIAGNOSIS — I5022 Chronic systolic (congestive) heart failure: Secondary | ICD-10-CM

## 2022-10-08 ENCOUNTER — Telehealth: Payer: Self-pay

## 2022-10-08 NOTE — Progress Notes (Signed)
EPIC Encounter for ICM Monitoring  Patient Name: Diana Campbell is a 77 y.o. female Date: 10/08/2022 Primary Care Physican: Ladon Applebaum Primary Cardiologist: Tresa Endo Electrophysiologist: Croitoru 05/28/2021 Weight: 205 lbs 01/01/2022 Office Weight: 203 lbs   Since 31-Aug-2022 Time in AT/AF  0.0 hr/day (0.0%)                                                             Attempted call to patient and unable to reach.   Transmission reviewed.     Optivol thoracic impedance suggesting normal fluid levels within the last month.     Prescribed: No diuretic (Previously on Furosemide 40 mg 1 tablet as needed) Spironolactone 25 mg take 0.5 tablet (12.5 mg total) by mouth daily.   Labs: 12/22/2021 Creatinine 0.93, BUN 13, Potassium 3.9, Sodium 143 10/01/2021 Creatinine 1.0, BUN 16, Potassium 4.7, Sodium 147  07/24/2021 Creatine 1.05, BUN 11, Potassium 4.6, Sodium 143, GFR 55 A complete set of results can be found in Results Review.   Recommendations:  Unable to reach.     Follow-up plan: ICM clinic phone appointment on 11/09/2022.   91 day device clinic remote transmission 10/28/2022.     EP/Cardiology Office Visits:  Recall 03/18/2023 with Dr Royann Shivers.   12/01/2022 with Dr Tresa Endo.     Copy of ICM check sent to Dr. Royann Shivers.   3 month ICM trend: 10/05/2022.    12-14 Month ICM trend:     [name], RN 10/08/2022 7:14 AM

## 2022-10-08 NOTE — Telephone Encounter (Signed)
Remote ICM transmission received.  Attempted call to patient regarding ICM remote transmission and no answer.  

## 2022-10-22 ENCOUNTER — Encounter: Payer: Self-pay | Admitting: Neurosurgery

## 2022-10-22 ENCOUNTER — Other Ambulatory Visit: Payer: Self-pay | Admitting: Physician Assistant

## 2022-10-22 DIAGNOSIS — M48062 Spinal stenosis, lumbar region with neurogenic claudication: Secondary | ICD-10-CM

## 2022-10-26 NOTE — Discharge Instructions (Signed)

## 2022-10-27 ENCOUNTER — Ambulatory Visit
Admission: RE | Admit: 2022-10-27 | Discharge: 2022-10-27 | Disposition: A | Discharge status: Home / Self Care | Payer: Medicare PPO | Source: Ambulatory Visit | Attending: Physician Assistant | Admitting: Physician Assistant

## 2022-10-27 ENCOUNTER — Ambulatory Visit
Admission: RE | Admit: 2022-10-27 | Discharge: 2022-10-27 | Disposition: A | Discharge status: Home / Self Care | Payer: Medicare PPO | Source: Ambulatory Visit | Attending: Physician Assistant

## 2022-10-27 DIAGNOSIS — M48062 Spinal stenosis, lumbar region with neurogenic claudication: Secondary | ICD-10-CM

## 2022-10-27 MED ORDER — ONDANSETRON HCL 4 MG/2ML IJ SOLN
4.0000 mg | Freq: Once | INTRAMUSCULAR | Status: AC | PRN
  Administered 2022-10-27: 4 mg via INTRAMUSCULAR

## 2022-10-27 MED ORDER — IOPAMIDOL (ISOVUE-M 200) INJECTION 41%
18.0000 mL | Freq: Once | INTRAMUSCULAR | Status: AC
  Administered 2022-10-27: 18 mL via INTRATHECAL

## 2022-10-27 MED ORDER — DIAZEPAM 5 MG PO TABS
5.0000 mg | ORAL_TABLET | Freq: Once | ORAL | Status: AC
  Administered 2022-10-27: 5 mg via ORAL

## 2022-10-27 MED ORDER — MEPERIDINE HCL 50 MG/ML IJ SOLN
50.0000 mg | Freq: Once | INTRAMUSCULAR | Status: AC | PRN
  Administered 2022-10-27: 50 mg via INTRAMUSCULAR

## 2022-10-28 ENCOUNTER — Ambulatory Visit (INDEPENDENT_AMBULATORY_CARE_PROVIDER_SITE_OTHER): Payer: Medicare PPO

## 2022-10-28 DIAGNOSIS — I428 Other cardiomyopathies: Secondary | ICD-10-CM | POA: Diagnosis not present

## 2022-10-28 LAB — CUP PACEART REMOTE DEVICE CHECK
Battery Remaining Longevity: 44 mo
Battery Voltage: 2.97 V
Brady Statistic AP VP Percent: 0.23 %
Brady Statistic AP VS Percent: 82.99 %
Brady Statistic AS VP Percent: 0.03 %
Brady Statistic AS VS Percent: 16.75 %
Brady Statistic RA Percent Paced: 74.51 %
Brady Statistic RV Percent Paced: 0.25 %
Date Time Interrogation Session: 20241009043722
HighPow Impedance: 42 Ohm
HighPow Impedance: 53 Ohm
Implantable Lead Connection Status: 753985
Implantable Lead Connection Status: 753985
Implantable Lead Implant Date: 20120702
Implantable Lead Implant Date: 20120702
Implantable Lead Location: 753859
Implantable Lead Location: 753860
Implantable Lead Model: 5076
Implantable Lead Model: 6947
Implantable Pulse Generator Implant Date: 20191011
Lead Channel Impedance Value: 399 Ohm
Lead Channel Impedance Value: 456 Ohm
Lead Channel Impedance Value: 494 Ohm
Lead Channel Pacing Threshold Amplitude: 0.625 V
Lead Channel Pacing Threshold Amplitude: 0.875 V
Lead Channel Pacing Threshold Pulse Width: 0.4 ms
Lead Channel Pacing Threshold Pulse Width: 0.4 ms
Lead Channel Sensing Intrinsic Amplitude: 2 mV
Lead Channel Sensing Intrinsic Amplitude: 2 mV
Lead Channel Sensing Intrinsic Amplitude: 7.75 mV
Lead Channel Sensing Intrinsic Amplitude: 7.75 mV
Lead Channel Setting Pacing Amplitude: 1.75 V
Lead Channel Setting Pacing Amplitude: 2 V
Lead Channel Setting Pacing Pulse Width: 0.4 ms
Lead Channel Setting Sensing Sensitivity: 0.3 mV
Zone Setting Status: 755011
Zone Setting Status: 755011

## 2022-11-07 ENCOUNTER — Other Ambulatory Visit: Payer: Self-pay | Admitting: Cardiovascular Disease

## 2022-11-09 ENCOUNTER — Ambulatory Visit: Payer: Medicare PPO | Attending: Cardiovascular Disease

## 2022-11-09 DIAGNOSIS — Z95810 Presence of automatic (implantable) cardiac defibrillator: Secondary | ICD-10-CM

## 2022-11-09 DIAGNOSIS — I5022 Chronic systolic (congestive) heart failure: Secondary | ICD-10-CM

## 2022-11-12 ENCOUNTER — Telehealth: Payer: Self-pay

## 2022-11-12 NOTE — Telephone Encounter (Signed)
Remote ICM transmission received.  Attempted call to patient regarding ICM remote transmission and no answer.  

## 2022-11-12 NOTE — Progress Notes (Signed)
EPIC Encounter for ICM Monitoring  Patient Name: Diana Campbell is a 77 y.o. female Date: 11/12/2022 Primary Care Physican: Ladon Applebaum Primary Cardiologist: Tresa Endo Electrophysiologist: Croitoru 05/28/2021 Weight: 205 lbs 01/01/2022 Office Weight: 203 lbs   Since 28-Oct-2022 Time in AT/AF  0.0 hr/day (0.0%)                                                             Attempted call to patient and unable to reach.   Transmission reviewed.     Optivol thoracic impedance suggesting normal fluid levels within the last month.     Prescribed: No diuretic (Previously on Furosemide 40 mg 1 tablet as needed) Spironolactone 25 mg take 0.5 tablet (12.5 mg total) by mouth daily.   Labs: 08/25/2022 Creatinine 0.99, BUN 16, Potassium 4.3, Sodium 144, GFR 58 A complete set of results can be found in Results Review.   Recommendations:  Unable to reach.     Follow-up plan: ICM clinic phone appointment on 12/14/2022.   91 day device clinic remote transmission 01/27/2023.     EP/Cardiology Office Visits:  Recall 03/18/2023 with Dr Royann Shivers.   12/01/2022 with Dr Tresa Endo.     Copy of ICM check sent to Dr. Royann Shivers.   3 month ICM trend: 11/09/2022.    12-14 Month ICM trend:     [name], RN 11/12/2022 11:42 AM

## 2022-11-13 NOTE — Progress Notes (Signed)
Remote ICD transmission.   

## 2022-11-19 ENCOUNTER — Other Ambulatory Visit: Payer: Self-pay | Admitting: Cardiovascular Disease

## 2022-11-20 ENCOUNTER — Ambulatory Visit (HOSPITAL_COMMUNITY): Payer: Medicare PPO | Attending: Cardiovascular Disease

## 2022-11-20 DIAGNOSIS — I428 Other cardiomyopathies: Secondary | ICD-10-CM | POA: Diagnosis present

## 2022-11-20 LAB — ECHOCARDIOGRAM COMPLETE
Area-P 1/2: 3.39 cm2
S' Lateral: 4.3 cm

## 2022-12-01 ENCOUNTER — Ambulatory Visit: Payer: Medicare PPO | Admitting: Cardiovascular Disease

## 2022-12-14 ENCOUNTER — Ambulatory Visit: Payer: Medicare PPO | Attending: Cardiovascular Disease

## 2022-12-14 DIAGNOSIS — I5022 Chronic systolic (congestive) heart failure: Secondary | ICD-10-CM

## 2022-12-14 DIAGNOSIS — Z95810 Presence of automatic (implantable) cardiac defibrillator: Secondary | ICD-10-CM

## 2022-12-16 ENCOUNTER — Telehealth: Payer: Self-pay

## 2022-12-16 NOTE — Progress Notes (Signed)
EPIC Encounter for ICM Monitoring  Patient Name: Diana Campbell is a 77 y.o. female Date: 12/16/2022 Primary Care Physican: Ladon Applebaum Primary Cardiologist: Tresa Endo Electrophysiologist: Croitoru 05/28/2021 Weight: 205 lbs 01/01/2022 Office Weight: 203 lbs   Since 09-Nov-2022 Time in AT/AF  <0.1 hr/day (<0.1%)                                                             Attempted call to patient and unable to reach.   Transmission reviewed.    Optivol thoracic impedance suggesting normal fluid levels within the last month.     Prescribed: No diuretic (Previously on Furosemide 40 mg 1 tablet as needed) Spironolactone 25 mg take 0.5 tablet (12.5 mg total) by mouth daily.   Labs: 08/25/2022 Creatinine 0.99, BUN 16, Potassium 4.3, Sodium 144, GFR 58 A complete set of results can be found in Results Review.   Recommendations:  Unable to reach.     Follow-up plan: ICM clinic phone appointment on 01/18/2023.   91 day device clinic remote transmission 01/27/2023.     EP/Cardiology Office Visits:  Recall 03/18/2023 with Dr Royann Shivers.   12/23/2022 with Juanda Crumble, PA.     Copy of ICM check sent to Dr. Royann Shivers.   3 month ICM trend: 12/14/2022.    12-14 Month ICM trend:     [name], RN 12/16/2022 9:04 AM

## 2022-12-16 NOTE — Telephone Encounter (Signed)
Remote ICM transmission received.  Attempted call to patient regarding ICM remote transmission and no answer.  

## 2022-12-22 NOTE — Progress Notes (Unsigned)
Cardiology Office Note   Date:  12/22/2022  [name], Diana Campbell 12, 1947, MRN [id number] PCP:  Ladon Applebaum [facility] Cardiologist: Nicki Guadalajara, MD  Reason for visit: 32-month follow-up  History of Present Illness    Diana Campbell is a 77 y.o. female with a hx of V-fib arrest in June 2005, nonischemic cardiomyopathy with initial ejection fraction 15%, nonobstructive CAD, ICD implant, diabetes, hypertension, hyperlipidemia, sleep apnea, tobacco use.  She was last seen by Dr. Tresa Endo in May 2004.  She is feeling well.  She admitted to some leg discomfort with walking -ABIs ordered..  No palpitations.  Echo March 2023 with EF 30 to 35%.  Dr. Tresa Endo recommended she discontinue snuff.  Recommend a 37-month follow-up with repeat echo.  Echo November 2024 showed EF 30-35%, mild LVH, grade 1 diastolic dysfunction -no significant change.  She saw Dr. Kirke Corin in July 2024.  His notes mention Doppler studies showed "noncompressible vessels with abnormal TBI.  Duplex showed occluded anterior tibial artery on the right with significant stenosis in the mid PTA.  On the left, the anterior tibial was occluded distally.  She had previous CT lumbar spine that showed evidence of spinal stenosis.  She reports that she used to receive steroid injections with improvements but they stopped working.  She refused to consider surgery."  He believed her discomfort was secondary to pseudoclaudication spinal stenosis.  He did not see indication for revascularization at that time. Scheduled to f/u in 1 year.  Today, ***  Nonischemic cardiomyopathy Chronic systolic and diastolic heart failure -Echo November 2024 showed EF 30-35%, mild LVH, grade 1 diastolic dysfunction -no significant change from 1 year prior. -Status post ICD implant - device followed by Dr. Royann Shivers -***  Nonobstructive CAD -***  Peripheral arterial disease -With history of diabetes and tobacco use -Seen by Dr. Kirke Corin  in July 2024 -Sx thought secondary to pseudoclaudication spinal stenosis.  He did not see indication for revascularization at that time. Scheduled to f/u in 1 year. -***  Hypertension -*** -Goal BP is <130/80.  Recommend DASH diet (high in vegetables, fruits, low-fat dairy products, whole grains, poultry, fish, and nuts and low in sweets, sugar-sweetened beverages, and red meats), salt restriction and increase physical activity.  Hyperlipidemia -*** -Recommend cholesterol lowering diets - Mediterranean diet, DASH diet, vegetarian diet, low-carbohydrate diet and avoidance of trans fats.  Discussed healthier choice substitutes.  Nuts, high-fiber foods, and fiber supplements may also improve lipids.    Obesity -Even a 5-10% weight loss can have cardiovascular benefits.   -Recommend moderate intensity activity for 30 minutes 5 days/week and the DASH diet.  Tobacco use  -Recommend tobacco cessation.  Reviewed physiologic effects of nicotine and the immediate-eventual benefits of quitting including improvement in cough/breathing and reduction in cardiovascular events.  Discussed quitting tips such as removing triggers and getting support from family/friends and [address]. -USPSTF recommends one-time screening for abdominal aortic aneurysm (AAA) by ultrasound in men 32 -69 years old who have ever smoked.      Disposition - Follow-up in ***     Objective / Physical Exam   EKG today: ***  Vital signs:  There were no vitals taken for this visit.    GEN: No acute distress NECK: No carotid bruits CARDIAC: ***RRR, no murmurs RESPIRATORY:  Clear to auscultation without rales, wheezing or rhonchi  EXTREMITIES: No edema  Assessment and Plan   ***   {Are you ordering a CV Procedure (e.g. stress  test, cath, DCCV, TEE, etc)?   Press F2        :[id number]}    Signed, Bernette Mayers  12/22/2022 [facility]

## 2022-12-23 ENCOUNTER — Ambulatory Visit: Payer: Medicare PPO | Attending: Physician Assistant | Admitting: Physician Assistant

## 2022-12-23 ENCOUNTER — Telehealth: Payer: Self-pay | Admitting: *Deleted

## 2022-12-23 ENCOUNTER — Encounter: Payer: Self-pay | Admitting: Physician Assistant

## 2022-12-23 ENCOUNTER — Ambulatory Visit: Payer: Medicare PPO | Admitting: Physician Assistant

## 2022-12-23 ENCOUNTER — Telehealth: Payer: Self-pay | Admitting: Cardiovascular Disease

## 2022-12-23 VITALS — BP 100/60 | HR 62 | Ht 66.0 in | Wt 202.4 lb

## 2022-12-23 DIAGNOSIS — I5022 Chronic systolic (congestive) heart failure: Secondary | ICD-10-CM | POA: Diagnosis not present

## 2022-12-23 DIAGNOSIS — I428 Other cardiomyopathies: Secondary | ICD-10-CM

## 2022-12-23 DIAGNOSIS — I2510 Atherosclerotic heart disease of native coronary artery without angina pectoris: Secondary | ICD-10-CM

## 2022-12-23 DIAGNOSIS — I739 Peripheral vascular disease, unspecified: Secondary | ICD-10-CM | POA: Diagnosis not present

## 2022-12-23 DIAGNOSIS — E785 Hyperlipidemia, unspecified: Secondary | ICD-10-CM

## 2022-12-23 DIAGNOSIS — I10 Essential (primary) hypertension: Secondary | ICD-10-CM

## 2022-12-23 DIAGNOSIS — Z720 Tobacco use: Secondary | ICD-10-CM

## 2022-12-23 DIAGNOSIS — E7800 Pure hypercholesterolemia, unspecified: Secondary | ICD-10-CM

## 2022-12-23 MED ORDER — CARVEDILOL 6.25 MG PO TABS: 6.2500 mg | ORAL_TABLET | Freq: Two times a day (BID) | ORAL | 3 refills | Status: DC

## 2022-12-23 NOTE — Progress Notes (Signed)
Cardiology Office Note   Date:  12/23/2022  ID:  Lillette, Rissmiller Jan 21, 1945, MRN [id number] PCP:  Ladon Applebaum [facility] Cardiologist: Nicki Guadalajara, MD  Reason for visit: 47-month follow-up  History of Present Illness    Diana Campbell is a 77 y.o. female with a hx of V-fib arrest in June 2005, nonischemic cardiomyopathy with initial ejection fraction 15%, nonobstructive CAD, ICD implant, diabetes, hypertension, hyperlipidemia, sleep apnea, tobacco use.  She was last seen by Dr. Tresa Endo in May 2004.  She is feeling well.  She admitted to some leg discomfort with walking -ABIs ordered..  No palpitations.  Echo March 2023 with EF 30 to 35%.  Dr. Tresa Endo recommended she discontinue snuff.  Recommend a 40-month follow-up with repeat echo.  Echo November 2024 showed EF 30-35%, mild LVH, grade 1 diastolic dysfunction -no significant change.  She saw Dr. Kirke Corin in July 2024.  His notes mention Doppler studies showed "noncompressible vessels with abnormal TBI.  Duplex showed occluded anterior tibial artery on the right with significant stenosis in the mid PTA.  On the left, the anterior tibial was occluded distally.  She had previous CT lumbar spine that showed evidence of spinal stenosis.  She reports that she used to receive steroid injections with improvements but they stopped working.  She refused to consider surgery."  He believed her discomfort was secondary to pseudoclaudication spinal stenosis.  He did not see indication for revascularization at that time. Scheduled to f/u in 1 year.  Today, patient comes in with her granddaughter.  Patient states she is doing well from a heart failure standpoint.  She denies shortness of breath, PND, orthopnea or lower extreme edema.  She also denies chest pain, palpitations, lightheadedness and syncope.  Patient states she has back/leg pain with certain movements secondary to a "gap in her spine."  She has a follow-up in a couple weeks to  determine therapy.  When reviewing her medications, she mentions instead of Coreg 12.5mg  1/2 tablet twice daily.  She is taking Coreg 12.5mg  1 tablet once a day since it is difficult to cut tablets.     Objective / Physical Exam   EKG today: Atrial paced rhythm with frequent PVCs  Vital signs:  BP 100/60   Pulse 62   Ht 5\' 6"  (1.676 m)   Wt 202 lb 6.4 oz (91.8 kg)   SpO2 99%   BMI 32.67 kg/m     GEN: No acute distress NECK: No carotid bruits CARDIAC: RRR with ectopics, no murmurs RESPIRATORY:  Clear to auscultation without rales, wheezing or rhonchi  EXTREMITIES: No edema  Assessment and Plan    Nonischemic cardiomyopathy Chronic systolic and diastolic heart failure, euvolemic -Echo November 2024 showed EF 30-35%, mild LVH, grade 1 diastolic dysfunction -no significant change from 1 year prior. -Status post ICD implant - device followed by Dr. Royann Shivers -Continue carvedilol 6.25 mg twice daily (refilled today), Jardiance 10 mg daily (patient denies UTI/yeast infections), Entresto 49-51 mg twice daily, spironolactone 25 mg half tablet daily.  Patient had normal kidney function and potassium in August 2024.  Patient denies lightheadedness.  Nonobstructive CAD -Continue lipid therapy.  Peripheral arterial disease -With history of diabetes and tobacco use -patient states she will continue snuff use -Seen by Dr. Kirke Corin in July 2024 -Sx thought secondary to pseudoclaudication spinal stenosis.  He did not see indication for revascularization at that time. Scheduled to f/u in 1 year. -Patient is following up with neurology for spinal  stenosis management.  Hypertension, controlled -Continue current medications.  I stated the blood pressure 100/60 is in normal range for systolic heart failure.  Patient denies lightheadedness. -Goal BP is <130/80.  Recommend DASH diet (high in vegetables, fruits, low-fat dairy products, whole grains, poultry, fish, and nuts and low in sweets,  sugar-sweetened beverages, and red meats), salt restriction and increase physical activity.  Hyperlipidemia with goal LDL less than 70 -LDL 55 in August 2024.  Continue Lipitor. -Recommend cholesterol lowering diets - Mediterranean diet, DASH diet, vegetarian diet, low-carbohydrate diet and avoidance of trans fats.  Discussed healthier choice substitutes.  Nuts, high-fiber foods, and fiber supplements may also improve lipids.    Tobacco use  -Recommend tobacco cessation - pt not interested.  Disposition - Follow-up in 6 months.   Signed, Diana Kettle, PA-C  12/23/2022 [facility]

## 2022-12-23 NOTE — Telephone Encounter (Signed)
Granddaughter is calling back per Nurse to let us know she takes atorvastatin (LIPITOR) 10 MG tablet not the 20 mg. Please update med list

## 2022-12-23 NOTE — Telephone Encounter (Signed)
Patient was rescheduled to 12/23/22 at 10:05am.

## 2022-12-23 NOTE — Telephone Encounter (Signed)
LMOM (DPR) requesting that pt call back to reschedule appointment today. Provider requested to move patient's appointment to 9:40am. Main number and office hours provided.

## 2022-12-23 NOTE — Patient Instructions (Signed)
Medication Instructions:  Decrease Carvedilol to 6.25 mg ( Take 1 Tablet Twice Daily). *If you need a refill on your cardiac medications before your next appointment, please call your pharmacy*   Lab Work: No Labs If you have labs (blood work) drawn today and your tests are completely normal, you will receive your results only by: MyChart Message (if you have MyChart) OR A paper copy in the mail If you have any lab test that is abnormal or we need to change your treatment, we will call you to review the results.   Testing/Procedures: No Testing   Follow-Up: At [facility], you and your health needs are our priority.  As part of our continuing mission to provide you with exceptional heart care, we have created designated Provider Care Teams.  These Care Teams include your primary Cardiologist (physician) and Advanced Practice Providers (APPs -  Physician Assistants and Nurse Practitioners) who all work together to provide you with the care you need, when you need it.  We recommend signing up for the patient portal called "MyChart".  Sign up information is provided on this After Visit Summary.  MyChart is used to connect with patients for Virtual Visits (Telemedicine).  Patients are able to view lab/test results, encounter notes, upcoming appointments, etc.  Non-urgent messages can be sent to your provider as well.   To learn more about what you can do with MyChart, go to [email or web address].    Your next appointment:   6 month(s)  Provider:   Nicki Guadalajara, MD

## 2022-12-23 NOTE — Telephone Encounter (Signed)
Attempted to call granddaughter with no answer. Call could not be completed at this time. Unable to leave message. Will try calling back.

## 2022-12-24 NOTE — Telephone Encounter (Signed)
Granddaughter report patient is taking Lipitor 10 mg and no the 20 mg

## 2023-01-12 ENCOUNTER — Other Ambulatory Visit: Payer: Self-pay | Admitting: "Endocrinology

## 2023-01-12 ENCOUNTER — Other Ambulatory Visit: Payer: Self-pay | Admitting: Cardiovascular Disease

## 2023-01-12 DIAGNOSIS — E119 Type 2 diabetes mellitus without complications: Secondary | ICD-10-CM

## 2023-01-14 ENCOUNTER — Other Ambulatory Visit: Payer: Self-pay | Admitting: *Deleted

## 2023-01-14 DIAGNOSIS — E119 Type 2 diabetes mellitus without complications: Secondary | ICD-10-CM

## 2023-01-14 DIAGNOSIS — Z794 Long term (current) use of insulin: Secondary | ICD-10-CM

## 2023-01-14 MED ORDER — INSULIN LISPRO PROT & LISPRO (75-25 MIX) 100 UNIT/ML KWIKPEN
15.0000 [IU] | PEN_INJECTOR | Freq: Two times a day (BID) | SUBCUTANEOUS | 11 refills | Status: DC
Start: 2023-01-14 — End: 2023-04-28

## 2023-01-18 ENCOUNTER — Ambulatory Visit: Payer: Medicare PPO | Attending: Cardiovascular Disease

## 2023-01-18 DIAGNOSIS — Z95810 Presence of automatic (implantable) cardiac defibrillator: Secondary | ICD-10-CM | POA: Diagnosis not present

## 2023-01-18 DIAGNOSIS — I5022 Chronic systolic (congestive) heart failure: Secondary | ICD-10-CM

## 2023-01-27 ENCOUNTER — Ambulatory Visit (INDEPENDENT_AMBULATORY_CARE_PROVIDER_SITE_OTHER): Payer: Medicare PPO

## 2023-01-27 DIAGNOSIS — I428 Other cardiomyopathies: Secondary | ICD-10-CM

## 2023-01-27 LAB — CUP PACEART REMOTE DEVICE CHECK
Battery Remaining Longevity: 39 mo
Battery Voltage: 2.97 V
Brady Statistic AP VP Percent: 0.15 %
Brady Statistic AP VS Percent: 81.94 %
Brady Statistic AS VP Percent: 0.02 %
Brady Statistic AS VS Percent: 17.88 %
Brady Statistic RA Percent Paced: 75.24 %
Brady Statistic RV Percent Paced: 0.18 %
Date Time Interrogation Session: 20250108002204
HighPow Impedance: 46 Ohm
HighPow Impedance: 67 Ohm
Implantable Lead Connection Status: 753985
Implantable Lead Connection Status: 753985
Implantable Lead Implant Date: 20120702
Implantable Lead Implant Date: 20120702
Implantable Lead Location: 753859
Implantable Lead Location: 753860
Implantable Lead Model: 5076
Implantable Lead Model: 6947
Implantable Pulse Generator Implant Date: 20191011
Lead Channel Impedance Value: 399 Ohm
Lead Channel Impedance Value: 494 Ohm
Lead Channel Impedance Value: 513 Ohm
Lead Channel Pacing Threshold Amplitude: 0.75 V
Lead Channel Pacing Threshold Amplitude: 0.875 V
Lead Channel Pacing Threshold Pulse Width: 0.4 ms
Lead Channel Pacing Threshold Pulse Width: 0.4 ms
Lead Channel Sensing Intrinsic Amplitude: 2.25 mV
Lead Channel Sensing Intrinsic Amplitude: 2.25 mV
Lead Channel Sensing Intrinsic Amplitude: 9.125 mV
Lead Channel Sensing Intrinsic Amplitude: 9.125 mV
Lead Channel Setting Pacing Amplitude: 1.75 V
Lead Channel Setting Pacing Amplitude: 2 V
Lead Channel Setting Pacing Pulse Width: 0.4 ms
Lead Channel Setting Sensing Sensitivity: 0.3 mV
Zone Setting Status: 755011
Zone Setting Status: 755011

## 2023-02-16 ENCOUNTER — Other Ambulatory Visit: Payer: Self-pay | Admitting: Cardiovascular Disease

## 2023-02-22 ENCOUNTER — Ambulatory Visit: Payer: Medicare PPO | Attending: Cardiovascular Disease

## 2023-02-22 DIAGNOSIS — Z95810 Presence of automatic (implantable) cardiac defibrillator: Secondary | ICD-10-CM | POA: Diagnosis not present

## 2023-02-22 DIAGNOSIS — I5022 Chronic systolic (congestive) heart failure: Secondary | ICD-10-CM

## 2023-03-02 ENCOUNTER — Other Ambulatory Visit (HOSPITAL_COMMUNITY): Payer: Self-pay | Admitting: Family Medicine

## 2023-03-02 ENCOUNTER — Ambulatory Visit (HOSPITAL_COMMUNITY)
Admission: RE | Admit: 2023-03-02 | Discharge: 2023-03-02 | Disposition: A | Discharge status: Home / Self Care | Payer: Medicare PPO | Source: Ambulatory Visit | Attending: Family Medicine | Admitting: Family Medicine

## 2023-03-02 DIAGNOSIS — R053 Chronic cough: Secondary | ICD-10-CM | POA: Insufficient documentation

## 2023-03-03 LAB — COMPREHENSIVE METABOLIC PANEL
ALT: 10 [IU]/L (ref 0–32)
AST: 16 [IU]/L (ref 0–40)
Albumin: 3.8 g/dL (ref 3.8–4.8)
Alkaline Phosphatase: 77 [IU]/L (ref 44–121)
BUN/Creatinine Ratio: 11 — ABNORMAL LOW (ref 12–28)
BUN: 10 mg/dL (ref 8–27)
Bilirubin Total: 0.4 mg/dL (ref 0.0–1.2)
CO2: 26 mmol/L (ref 20–29)
Calcium: 9.4 mg/dL (ref 8.7–10.3)
Chloride: 106 mmol/L (ref 96–106)
Creatinine, Ser: 0.87 mg/dL (ref 0.57–1.00)
Globulin, Total: 2.7 g/dL (ref 1.5–4.5)
Glucose: 89 mg/dL (ref 70–99)
Potassium: 3.4 mmol/L — ABNORMAL LOW (ref 3.5–5.2)
Sodium: 146 mmol/L — ABNORMAL HIGH (ref 134–144)
Total Protein: 6.5 g/dL (ref 6.0–8.5)
eGFR: 68 mL/min/{1.73_m2} (ref >59)

## 2023-03-08 ENCOUNTER — Encounter: Payer: Self-pay | Admitting: "Endocrinology

## 2023-03-08 ENCOUNTER — Ambulatory Visit: Payer: Medicare PPO | Admitting: "Endocrinology

## 2023-03-08 VITALS — BP 112/58 | HR 52 | Ht 66.0 in | Wt 200.4 lb

## 2023-03-08 DIAGNOSIS — Z794 Long term (current) use of insulin: Secondary | ICD-10-CM

## 2023-03-08 DIAGNOSIS — E119 Type 2 diabetes mellitus without complications: Secondary | ICD-10-CM | POA: Diagnosis not present

## 2023-03-08 DIAGNOSIS — E782 Mixed hyperlipidemia: Secondary | ICD-10-CM

## 2023-03-08 DIAGNOSIS — I10 Essential (primary) hypertension: Secondary | ICD-10-CM | POA: Diagnosis not present

## 2023-03-08 DIAGNOSIS — E876 Hypokalemia: Secondary | ICD-10-CM | POA: Insufficient documentation

## 2023-03-08 LAB — POCT GLYCOSYLATED HEMOGLOBIN (HGB A1C): HbA1c, POC (controlled diabetic range): 6.8 % (ref 0.0–7.0)

## 2023-03-08 MED ORDER — POTASSIUM CHLORIDE CRYS ER 20 MEQ PO TBCR
20.0000 meq | EXTENDED_RELEASE_TABLET | Freq: Every day | ORAL | 1 refills | Status: DC
Start: 2023-03-08 — End: 2023-11-30

## 2023-03-08 MED ORDER — INSULIN LISPRO PROT & LISPRO (75-25 MIX) 100 UNIT/ML KWIKPEN: PEN_INJECTOR | SUBCUTANEOUS | 1 refills | Status: DC

## 2023-03-08 NOTE — Patient Instructions (Signed)

## 2023-03-08 NOTE — Progress Notes (Signed)
03/08/2023  Endocrinology follow-up note   Subjective:    Patient ID: Diana Campbell, female    DOB: 01/24/1945, PCP Avis Epley, PA-C   Past Medical History:  Diagnosis Date   Arthritis    Automatic implantable cardioverter-defibrillator in situ 07/2010   Cardiac abnormality    with defibrilator   Chronic combined systolic and diastolic CHF (congestive heart failure) (HCC) 09/04/2015   Diabetes mellitus    H/O cardiac arrest 2006   Hypercholesteremia    Hypertension    Neuropathy    Past Surgical History:  Procedure Laterality Date   CARDIAC CATHETERIZATION  06/22/2003   Severe nonischemic cardiomyopathy, 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   CARDIAC CATHETERIZATION  04/14/2000   Normal coronary arteries.   CARDIAC DEFIBRILLATOR PLACEMENT  07/21/2010   Medtronic Protecta XT DR, model#[id number], serial#[id number]   CARDIOVASCULAR STRESS TEST  04/05/2000   Scintigraphic evidence of mild LV dilatation and global LV dysfunction with an EF 41% with mild inferolateral ischemia.   CARPAL TUNNEL RELEASE Right 01/23/2016   Procedure: CARPAL TUNNEL RELEASE;  Surgeon: Vickki Hearing, MD;  Location: [facility];  Service: Orthopedics;  Laterality: Right;  pt knows to arrive at 6:15   COLONOSCOPY  03/30/2011   Procedure: COLONOSCOPY;  Surgeon: Dalia Heading, MD;  Location: [facility];  Service: Gastroenterology;  Laterality: N/A;   DORSAL COMPARTMENT RELEASE Right 08/11/2013   Procedure: RELEASE DORSAL COMPARTMENT (DEQUERVAIN);  Surgeon: Vickki Hearing, MD;  Location: [facility];  Service: Orthopedics;  Laterality: Right;   ICD GENERATOR CHANGEOUT N/A 10/29/2017   Procedure: ICD GENERATOR CHANGEOUT;  Surgeon: Thurmon Fair, MD;  Location: [facility] INVAS[facility] CV LAB;  Service: Cardiovascular;  Laterality: N/A;   TRANSTHORACIC ECHOCARDIOGRAM  08/03/2011   EF 45-50%, mildly reduced LV systolic function.    Social History   Socioeconomic History   Marital status: Married    Spouse name: Not on file   Number of children: Not on file   Years of education: Not on file   Highest education level: Not on file  Occupational History   Not on file  Tobacco Use   Smoking status: Never   Smokeless tobacco: Current    Types: Snuff   Tobacco comments:    3 dips per day  Vaping Use   Vaping status: Never Used  Substance and Sexual Activity   Alcohol use: No   Drug use: No   Sexual activity: Yes    Birth control/protection: None  Other Topics Concern   Not on file  Social History Narrative   Not on file   Social Drivers of Health   Financial Resource Strain: Not on file  Food Insecurity: Not on file  Transportation Needs: Not on file  Physical Activity: Not on file  Stress: Not on file  Social Connections: Not on file   Outpatient Encounter Medications as of 03/08/2023  Medication Sig   potassium chloride SA (KLOR-CON M) 20 MEQ tablet Take 1 tablet (20 mEq total) by mouth daily with lunch.   ACCU-CHEK FASTCLIX LANCETS MISC 1 each by Does not apply route 2 (two) times daily.   ACCU-CHEK GUIDE test strip USE AS INSTRUCTED TO TEST BLOOD GLUCOSE FOUR TIMES DAILY   acetaminophen (TYLENOL) 325 MG tablet Take 650 mg by mouth in the morning and at bedtime.   amoxicillin-clavulanate (AUGMENTIN) 875-125 MG tablet Take by mouth 2 (two) times daily. (Patient not taking: Reported  on 08/11/2022)   aspirin EC 81 MG tablet Take 1 tablet (81 mg total) by mouth daily.   atorvastatin (LIPITOR) 20 MG tablet TAKE 1 TABLET BY MOUTH EVERY DAY   benzonatate (TESSALON) 200 MG capsule Take 200 mg by mouth 3 (three) times daily.   Blood Glucose Monitoring Suppl (ACCU-CHEK GUIDE) w/Device KIT Use as directed to check blood glucose four times daily   carvedilol (COREG) 6.25 MG tablet Take 1 tablet (6.25 mg total) by mouth 2 (two) times daily.   fluconazole (DIFLUCAN) 150 MG tablet Take 150 mg by mouth daily.    Insulin Lispro Prot & Lispro (HUMALOG 75/25 MIX) (75-25) 100 UNIT/ML Kwikpen Inject 15 units before breakfast and 15 units with supper only if glucose readings are above 90 and eating.   Insulin Lispro Prot & Lispro (HUMALOG MIX 75/25 KWIKPEN) (75-25) 100 UNIT/ML Kwikpen Inject 15 Units into the skin 2 (two) times daily before a meal.   JARDIANCE 10 MG TABS tablet TAKE 1 TABLET BY MOUTH EVERY DAY   Lancets Misc. (ACCU-CHEK FASTCLIX LANCET) KIT 1 each by Does not apply route 2 (two) times daily.   sacubitril-valsartan (ENTRESTO) 49-51 MG TAKE 1 TABLET BY MOUTH TWICE A DAY   spironolactone (ALDACTONE) 25 MG tablet TAKE 1/2 TABLET BY MOUTH EVERY DAY   [DISCONTINUED] atorvastatin (LIPITOR) 10 MG tablet TAKE 1 TABLET BY MOUTH EVERY DAY   [DISCONTINUED] Insulin Lispro Prot & Lispro (HUMALOG 75/25 MIX) (75-25) 100 UNIT/ML Kwikpen Inject 15 units before breakfast and 15 units with supper only if glucose readings are above 90 and eating. (Patient taking differently: 5-20 Units 2 (two) times daily. Inject 15 units before breakfast and 15 units with supper only if glucose readings are above 90 and eating.)   No facility-administered encounter medications on file as of 03/08/2023.   ALLERGIES: No Known Allergies VACCINATION STATUS: Immunization History  Administered Date(s) Administered   Influenza-Unspecified 11/19/2017    Diabetes She is returns for follow-up in the management of her currently uncontrolled type 2 diabetes, hyperlipidemia, hypertension.   Onset time: She was diagnosed with diabetes at approximately 78 years of age.  She is responding to her current insulin regimen. She is currently on Humalog 75/25, was supposed to take 15 units twice daily, ended up using anywhere from 5-20 units twice daily.  She remains on Jardiance 10 mg p.o. daily at breakfast.  She returns with controlled glycemic profile, A1c up to 6.8%.   She documents rare, mild hypoglycemic episodes.  She has done relatively  better on premixed insulin twice a day.  She tolerates this medication very well.    Jardiance was started by her cardiologist.  She has cardiac defibrillator in place.  She recently did have an episode of syncope, EMS cleared her on scene.   She did not document major hypoglycemic episodes.   Pertinent negatives for hypoglycemia include no confusion, headaches, pallor or seizures. There are no diabetic associated symptoms. Pertinent negatives for diabetes include no chest pain, no polydipsia, no polyphagia and no polyuria. There are no hypoglycemic complications. Symptoms are worsening. Diabetic complications include nephropathy. Risk factors for coronary artery disease include diabetes mellitus, dyslipidemia, hypertension, sedentary lifestyle and tobacco exposure. Her weight is stable, she complains of poor appetite. She is following a generally unhealthy diet, regularly eats "junk food".  She has not had a previous visit with a dietitian (She declined a referral to CDE.)  She has uncontrolled dyslipidemia despite Lipitor 10 mg p.o. daily.  An ACE inhibitor/angiotensin  II receptor blocker is being taken. Eye exam is current.    Hyperlipidemia This is a chronic problem. The current episode started more than 1 year ago. Pertinent negatives include no chest pain, myalgias or shortness of breath. Current antihyperlipidemic treatment includes statins. Risk factors for coronary artery disease include dyslipidemia, diabetes mellitus and a sedentary lifestyle.  Her previsit labs show LDL at 127, despite Lipitor 10 mg p.o. daily. Hypertension This is a chronic problem. The current episode started more than 1 year ago. The problem is controlled. Pertinent negatives include no chest pain, headaches, palpitations or shortness of breath. Past treatments include ACE inhibitors. Hypertensive end-organ damage includes kidney disease.    Review of systems: Limited as above.   Objective:    BP (!) 112/58    Pulse (!) 52   Ht 5\' 6"  (1.676 m)   Wt 200 lb 6.4 oz (90.9 kg)   BMI 32.35 kg/m   Wt Readings from Last 3 Encounters:  03/08/23 200 lb 6.4 oz (90.9 kg)  12/23/22 202 lb 6.4 oz (91.8 kg)  09/04/22 202 lb 6.4 oz (91.8 kg)     Physical Exam- Limited  Constitutional:  Body mass index is 32.35 kg/m. , not in acute distress, normal state of mind Eyes:  EOMI, no exophthalmos       Latest Ref Rng & Units 03/02/2023   10:09 AM 08/25/2022    8:49 AM 12/22/2021    7:59 AM  CMP  Glucose 70 - 99 mg/dL 89  562  130   BUN 8 - 27 mg/dL 10  16  13    Creatinine 0.57 - 1.00 mg/dL 8.65  7.84  6.96   Sodium 134 - 144 mmol/L 146  144  143   Potassium 3.5 - 5.2 mmol/L 3.4  4.3  3.9   Chloride 96 - 106 mmol/L 106  108  106   CO2 20 - 29 mmol/L 26  21  24    Calcium 8.7 - 10.3 mg/dL 9.4  9.4  9.5   Total Protein 6.0 - 8.5 g/dL 6.5  6.5  6.5   Total Bilirubin 0.0 - 1.2 mg/dL 0.4  0.4  0.5   Alkaline Phos 44 - 121 IU/L 77  76  84   AST 0 - 40 IU/L 16  13  13    ALT 0 - 32 IU/L 10  9  6      Lab Results  Component Value Date   HGBA1C 6.8 03/08/2023   HGBA1C 6.0 09/04/2022   HGBA1C 6.5 05/05/2022   MICROALBUR 0.7 08/28/2019   MICROALBUR 0.3 04/14/2016   Lipid Panel     Component Value Date/Time   CHOL 122 08/25/2022 0849   TRIG 58 08/25/2022 0849   HDL 54 08/25/2022 0849   CHOLHDL 2.3 08/25/2022 0849   CHOLHDL 3.5 08/28/2019 0851   VLDL 14 09/26/2015 0856   LDLCALC 55 08/25/2022 0849   LDLCALC 141 (H) 08/28/2019 0851     Assessment & Plan:   1. Type 2 diabetes mellitus with long-term current use of insulin (HCC)  -Her diabetes is complicated by cardiomyopathy and she remains at a high risk for more acute and chronic complications of diabetes which include CAD, CVA, CKD, retinopathy, and neuropathy. These are all discussed in detail with the patient.  Patient presents with fluctuating glycemic profile, mostly near target.  Her point-of-care A1c is up to 6.8% from 6% during her last  visit.    Recent labs reviewed.   - I have  re-counseled the patient on diet management and weight loss  by adopting a carbohydrate restricted / protein rich  Diet.  - she acknowledges that there is a room for improvement in her food and drink choices. - Suggestion is made for her to avoid simple carbohydrates  from her diet including Cakes, Sweet Desserts, Ice Cream, Soda (diet and regular), Sweet Tea, Candies, Chips, Cookies, Store Bought Juices, Alcohol , Artificial Sweeteners,  Coffee Creamer, and "Sugar-free" Products, Lemonade. This will help patient to have more stable blood glucose profile and potentially avoid unintended weight gain.  The following Lifestyle Medicine recommendations according to American College of Lifestyle Medicine  [facility]) were discussed and and offered to patient and she  agrees to start the journey:  A. Whole Foods, Plant-Based Nutrition comprising of fruits and vegetables, plant-based proteins, whole-grain carbohydrates was discussed in detail with the patient.   A list for source of those nutrients were also provided to the patient.  Patient will use only water or unsweetened tea for hydration. B.  The need to stay away from risky substances including alcohol, smoking; obtaining 7 to 9 hours of restorative sleep, at least 150 minutes of moderate intensity exercise weekly, the importance of healthy social connections,  and stress management techniques were discussed. C.  A full color page of  Calorie density of various food groups per pound showing examples of each food groups was provided to the patient.   - Patient is advised to stick to a routine mealtimes to eat 3 meals a day and avoid unnecessary snacks specially with ultra processed foods.    - The patient  declined referral to a  CDE for individualized DM education.  - I have approached patient with the following individualized plan to manage diabetes and patient agrees.   - Patient  worries a lot about  hypoglycemia. -She has done very well on premixed insulin.  In the interest of avoiding hypoglycemia, she is advised to lower her Humalog 75/25 to 15 units with breakfast and 15 units with supper  for Premeal blood glucose readings above 90 mg per DL.  She is advised to continue Jardiance 10 mg p.o. daily at breakfast.    -She is advised and agrees to continue monitoring of blood glucose 4 times a day-before meals and at bedtime.  She did not get enough coverage for a CGM.  -Patient is encouraged to call clinic for blood glucose levels less than 70 or above 200 mg /dl.  -Her most recent labs show normal renal function.  She would have benefited from low-dose metformin, however she wishes to avoid it at this time.    - Patient specific target  for A1c; LDL, HDL, Triglycerides,were discussed in detail.  2) BP/HTN:  -Her blood pressure is controlled to target.  She is currently on carvedilol 12.5 mg p.o. twice daily, spironolactone 25 mg p.o. once a day.   3) Lipids/HPL: Recent lipid panel showed improved cholesterol profile with LDL at 55 improving from 127.  She is advised to continue Lipitor 20 mg p.o. nightly.     The above detailed plan predominant lifestyle medicine nutrition will help with dyslipidemia besides type 2 diabetes.    4)  Weight/Diet: Her BMI 32.35.   This is complicating her diabetes care.  She is a candidate for modest weight loss.    She declined CDE consult . exercise, and carbohydrates information provided.  5) Chronic Care/Health Maintenance:  -Patient is on ACEI/ARB and Statin medications and encouraged  to continue to follow up with Ophthalmology, Podiatrist at least yearly or according to recommendations, and advised to  stay away from smoking. I have recommended yearly flu vaccine and pneumonia vaccination at least every 5 years; moderate intensity exercise for up to 150 minutes weekly; and  sleep for at least 7 hours a day.  -She was not ready for foot exam today.   She will be approached again.   - I advised patient to maintain close follow up with Avis Epley, PA-C for primary care needs.   I spent  26  minutes in the care of the patient today including review of labs from CMP, Lipids, Thyroid Function, Hematology (current and previous including abstractions from other facilities); face-to-face time discussing  her blood glucose readings/logs, discussing hypoglycemia and hyperglycemia episodes and symptoms, medications doses, her options of short and long term treatment based on the latest standards of care / guidelines;  discussion about incorporating lifestyle medicine;  and documenting the encounter. Risk reduction counseling performed per USPSTF guidelines to reduce  obesity and cardiovascular risk factors.     Please refer to Patient Instructions for Blood Glucose Monitoring and Insulin/Medications Dosing Guide"  in media tab for additional information. Please  also refer to " Patient Self Inventory" in the Media  tab for reviewed elements of pertinent patient history.  Tal Jana Half participated in the discussions, expressed understanding, and voiced agreement with the above plans.  All questions were answered to her satisfaction. she is encouraged to contact clinic should she have any questions or concerns prior to her return visit.    Follow up plan: -Return in about 4 months (around 07/06/2023) for F/U with Pre-visit Labs, Meter/CGM/Logs, A1c here.  Marquis Lunch, MD Phone: [phone]  Fax: [phone]  -  This note was partially dictated with voice recognition software. Similar sounding words can be transcribed inadequately or may not  be corrected upon review.  03/08/2023, 12:43 PM

## 2023-03-10 NOTE — Progress Notes (Signed)
 Remote ICD transmission.

## 2023-03-29 ENCOUNTER — Ambulatory Visit: Payer: Medicare PPO | Attending: Cardiovascular Disease

## 2023-03-29 DIAGNOSIS — Z95810 Presence of automatic (implantable) cardiac defibrillator: Secondary | ICD-10-CM

## 2023-03-29 DIAGNOSIS — I5022 Chronic systolic (congestive) heart failure: Secondary | ICD-10-CM | POA: Diagnosis not present

## 2023-04-01 ENCOUNTER — Telehealth: Payer: Self-pay

## 2023-04-01 NOTE — Progress Notes (Signed)
 EPIC Encounter for ICM Monitoring  Patient Name: Diana Campbell is a 78 y.o. female Date: 04/01/2023 Primary Care Physican: Ladon Applebaum Primary Cardiologist: Tresa Endo Electrophysiologist: Cro[place] 12/23/2022 Office Weight: 202 lbs 03/08/2023 Office Weight: 200 lbs   Since 22-Feb-2023 Time in AT/AF  <0.1 hr/day (<0.1%)                                                             Attempted call to patient and unable to reach.    Transmission results reviewed.    Optivol thoracic impedance suggesting normal fluid levels with the exception of possible fluid accumulation from 2/22-3/6.   Prescribed: No diuretic (Previously on Furosemide 40 mg 1 tablet as needed) Spironolactone 25 mg take 0.5 tablet (12.5 mg total) by mouth every other day.   Labs: 03/02/2023 Creatinine 0.87, BUN 10, Potassium 3.4, Sodium 146, GFR 68 A complete set of results can be found in Results Review.   Recommendations:  Unable to reach.     Follow-up plan: ICM clinic phone appointment on 05/03/2023.   91 day device clinic remote transmission 04/28/2023.     EP/Cardiology Office Visits:  Recall 03/18/2023 with Dr Royann Shivers.   Recall 06/21/2023 with Dr Tresa Endo.   Recall 08/11/2023 with Dr Kirke Corin.   Copy of ICM check sent to Dr. Royann Shivers.    3 month ICM trend: 03/29/2023.    12-14 Month ICM trend:     [name], RN 04/01/2023 7:52 AM

## 2023-04-01 NOTE — Telephone Encounter (Signed)
 Remote ICM transmission received.  Attempted call to patient regarding ICM remote transmission and no answer.

## 2023-04-02 ENCOUNTER — Other Ambulatory Visit: Payer: Self-pay | Admitting: Cardiovascular Disease

## 2023-04-02 MED ORDER — ATORVASTATIN CALCIUM 20 MG PO TABS: 20.0000 mg | ORAL_TABLET | Freq: Every day | ORAL | 2 refills | Status: DC

## 2023-04-20 ENCOUNTER — Other Ambulatory Visit (HOSPITAL_COMMUNITY): Payer: Self-pay

## 2023-04-20 ENCOUNTER — Telehealth: Payer: Self-pay

## 2023-04-20 NOTE — Telephone Encounter (Signed)
 Pharmacy Patient Advocate Encounter   Received notification from CoverMyMeds that prior authorization for ENTRESTO is required/requested.   Insurance verification completed.   The patient is insured through [employer] .   Per test claim: The current 90 day co-pay is, $0.  No PA needed at this time. This test claim was processed through [facility]- copay amounts may vary at other pharmacies due to pharmacy/plan contracts, or as the patient moves through the different stages of their insurance plan.

## 2023-04-28 ENCOUNTER — Telehealth: Payer: Self-pay

## 2023-04-28 ENCOUNTER — Ambulatory Visit (INDEPENDENT_AMBULATORY_CARE_PROVIDER_SITE_OTHER): Payer: Medicare PPO

## 2023-04-28 DIAGNOSIS — I428 Other cardiomyopathies: Secondary | ICD-10-CM | POA: Diagnosis not present

## 2023-04-28 DIAGNOSIS — E119 Type 2 diabetes mellitus without complications: Secondary | ICD-10-CM

## 2023-04-28 LAB — CUP PACEART REMOTE DEVICE CHECK
Battery Remaining Longevity: 36 mo
Battery Voltage: 2.96 V
Brady Statistic AP VP Percent: 0.2 %
Brady Statistic AP VS Percent: 85.63 %
Brady Statistic AS VP Percent: 0.02 %
Brady Statistic AS VS Percent: 14.15 %
Brady Statistic RA Percent Paced: 79.24 %
Brady Statistic RV Percent Paced: 0.22 %
Date Time Interrogation Session: 20250409012203
HighPow Impedance: 46 Ohm
HighPow Impedance: 66 Ohm
Implantable Lead Connection Status: 753985
Implantable Lead Connection Status: 753985
Implantable Lead Implant Date: 20120702
Implantable Lead Implant Date: 20120702
Implantable Lead Location: 753859
Implantable Lead Location: 753860
Implantable Lead Model: 5076
Implantable Lead Model: 6947
Implantable Pulse Generator Implant Date: 20191011
Lead Channel Impedance Value: 380 Ohm
Lead Channel Impedance Value: 437 Ohm
Lead Channel Impedance Value: 494 Ohm
Lead Channel Pacing Threshold Amplitude: 0.75 V
Lead Channel Pacing Threshold Amplitude: 0.875 V
Lead Channel Pacing Threshold Pulse Width: 0.4 ms
Lead Channel Pacing Threshold Pulse Width: 0.4 ms
Lead Channel Sensing Intrinsic Amplitude: 2.25 mV
Lead Channel Sensing Intrinsic Amplitude: 2.25 mV
Lead Channel Sensing Intrinsic Amplitude: 8.875 mV
Lead Channel Sensing Intrinsic Amplitude: 8.875 mV
Lead Channel Setting Pacing Amplitude: 1.75 V
Lead Channel Setting Pacing Amplitude: 2 V
Lead Channel Setting Pacing Pulse Width: 0.4 ms
Lead Channel Setting Sensing Sensitivity: 0.3 mV
Zone Setting Status: 755011
Zone Setting Status: 755011

## 2023-04-28 MED ORDER — INSULIN ASPART PROT & ASPART (70-30 MIX) 100 UNIT/ML PEN: 15.0000 [IU] | PEN_INJECTOR | Freq: Two times a day (BID) | SUBCUTANEOUS | 0 refills | Status: DC

## 2023-04-28 NOTE — Telephone Encounter (Signed)
 Pt called stating her insurance would no longer cover Humalog 75/25. Rx for Novolog 70/30 15 units bid with meals 90 day supply sent to [facility] per Dr.Nida's orders.

## 2023-05-03 ENCOUNTER — Encounter

## 2023-05-05 ENCOUNTER — Telehealth: Payer: Self-pay

## 2023-05-05 NOTE — Progress Notes (Signed)
 No ICM remote transmission received for 05/03/2023 and next ICM transmission scheduled for 05/17/2023.

## 2023-05-05 NOTE — Telephone Encounter (Signed)
 Tried to return pt's call, did not receive an answer and was unable to leave a message.

## 2023-05-11 NOTE — Telephone Encounter (Signed)
Tried to return call to pt but did not receive an answer and was unable to leave a message.

## 2023-05-17 ENCOUNTER — Encounter

## 2023-05-21 NOTE — Progress Notes (Signed)
 No ICM remote transmission received for 05/17/2023 and next ICM transmission scheduled for 05/24/2023.

## 2023-05-24 ENCOUNTER — Ambulatory Visit: Attending: Cardiovascular Disease

## 2023-05-24 DIAGNOSIS — I5022 Chronic systolic (congestive) heart failure: Secondary | ICD-10-CM

## 2023-05-24 DIAGNOSIS — Z95810 Presence of automatic (implantable) cardiac defibrillator: Secondary | ICD-10-CM | POA: Diagnosis not present

## 2023-05-25 ENCOUNTER — Telehealth: Payer: Self-pay

## 2023-05-25 NOTE — Telephone Encounter (Signed)
 ICM call to patient and assisted with sending manual remote transmission.  See ICM note.

## 2023-05-25 NOTE — Progress Notes (Signed)
 EPIC Encounter for ICM Monitoring  Patient Name: Diana Campbell is a 77 y.o. female Date: 05/25/2023 Primary Care Physican: Earlene Gleason Primary Cardiologist: Loetta Ringer Electrophysiologist: Croitoru 12/23/2022 Office Weight: 202 lbs 03/08/2023 Office Weight: 200 lbs 05/25/2023 Weight: 200 lbs at home   Since 28-Apr-2023 Time in AT/AF  <0.1 hr/day (<0.1%)                                                            Spoke with patient and she also had daughter with her on speaker phone.  Assisted with sending manual remote transmission.  Heart failure questions reviewed.  Transmission results reviewed.  Pt asymptomatic for fluid accumulation.  Her husband has been very sick and her daughter has moved in to assist with care for both of them.  She had an episode of passing out about a month ago but thinks it was low blood sugar.   Advised to add her daughter to [facility] form at the 5/22 OV with Dr Alvis Ba.      Optivol thoracic impedance suggesting normal fluid levels within the last month.   Prescribed: No diuretic (Previously on Furosemide  40 mg 1 tablet as needed) Spironolactone  25 mg take 0.5 tablet (12.5 mg total) by mouth every other day.   Labs: 03/02/2023 Creatinine 0.87, BUN 10, Potassium 3.4, Sodium 146, GFR 68 A complete set of results can be found in Results Review.   Recommendations:  Advised to call the office if she has any further episodes of passing out.  No changes and encouraged to call if experiencing any fluid symptoms.   Follow-up plan: ICM clinic phone appointment on 06/28/2023.   91 day device clinic remote transmission 07/28/2023.     EP/Cardiology Office Visits:  06/10/2023 with Dr Alvis Ba.   Recall 06/21/2023 with Dr Loetta Ringer.   Recall 08/11/2023 with Dr Alvenia Aus.   Copy of ICM check sent to Dr. Alvis Ba.    3 month ICM trend: 05/25/2023.    12-14 Month ICM trend:     Almyra Jain, RN 05/25/2023 2:50 PM

## 2023-05-25 NOTE — Telephone Encounter (Signed)
 Spoke with patient and advised home monitor is showing disconnected.  Was placed on speaker phone and daughter assisted patient with unplugging and plugging in home monitor and said it was charging.  Pt said daughter has moved in to assist with care for her and her husband. Advised patient and daughter to add her name to the DPR form at the 06/10/2023 office visit with Dr Alvis Ba.   Will call back in about an hour so monitor can charge and assist with sending manual remote transmission for monthly fluid level review.

## 2023-05-26 LAB — CUP PACEART REMOTE DEVICE CHECK
Battery Remaining Longevity: 35 mo
Battery Voltage: 2.96 V
Brady Statistic AP VP Percent: 0.12 %
Brady Statistic AP VS Percent: 88.4 %
Brady Statistic AS VP Percent: 0.01 %
Brady Statistic AS VS Percent: 11.47 %
Brady Statistic RA Percent Paced: 85.89 %
Brady Statistic RV Percent Paced: 0.14 %
Date Time Interrogation Session: 20250506144934
HighPow Impedance: 47 Ohm
HighPow Impedance: 66 Ohm
Implantable Lead Connection Status: 753985
Implantable Lead Connection Status: 753985
Implantable Lead Implant Date: 20120702
Implantable Lead Implant Date: 20120702
Implantable Lead Location: 753859
Implantable Lead Location: 753860
Implantable Lead Model: 5076
Implantable Lead Model: 6947
Implantable Pulse Generator Implant Date: 20191011
Lead Channel Impedance Value: 399 Ohm
Lead Channel Impedance Value: 456 Ohm
Lead Channel Impedance Value: 513 Ohm
Lead Channel Pacing Threshold Amplitude: 0.75 V
Lead Channel Pacing Threshold Amplitude: 0.75 V
Lead Channel Pacing Threshold Pulse Width: 0.4 ms
Lead Channel Pacing Threshold Pulse Width: 0.4 ms
Lead Channel Sensing Intrinsic Amplitude: 10 mV
Lead Channel Sensing Intrinsic Amplitude: 10 mV
Lead Channel Sensing Intrinsic Amplitude: 2.625 mV
Lead Channel Sensing Intrinsic Amplitude: 2.625 mV
Lead Channel Setting Pacing Amplitude: 1.75 V
Lead Channel Setting Pacing Amplitude: 2 V
Lead Channel Setting Pacing Pulse Width: 0.4 ms
Lead Channel Setting Sensing Sensitivity: 0.3 mV
Zone Setting Status: 755011
Zone Setting Status: 755011

## 2023-06-07 DIAGNOSIS — H52209 Unspecified astigmatism, unspecified eye: Secondary | ICD-10-CM | POA: Diagnosis not present

## 2023-06-07 DIAGNOSIS — H5203 Hypermetropia, bilateral: Secondary | ICD-10-CM | POA: Diagnosis not present

## 2023-06-07 DIAGNOSIS — H524 Presbyopia: Secondary | ICD-10-CM | POA: Diagnosis not present

## 2023-06-10 ENCOUNTER — Encounter: Payer: Self-pay | Admitting: Cardiovascular Disease

## 2023-06-10 ENCOUNTER — Ambulatory Visit: Attending: Cardiovascular Disease | Admitting: Cardiovascular Disease

## 2023-06-10 VITALS — BP 117/67 | HR 64 | Ht 66.0 in | Wt 187.8 lb

## 2023-06-10 DIAGNOSIS — I10 Essential (primary) hypertension: Secondary | ICD-10-CM

## 2023-06-10 DIAGNOSIS — I480 Paroxysmal atrial fibrillation: Secondary | ICD-10-CM

## 2023-06-10 DIAGNOSIS — I5042 Chronic combined systolic (congestive) and diastolic (congestive) heart failure: Secondary | ICD-10-CM | POA: Diagnosis not present

## 2023-06-10 DIAGNOSIS — Z794 Long term (current) use of insulin: Secondary | ICD-10-CM

## 2023-06-10 DIAGNOSIS — E7800 Pure hypercholesterolemia, unspecified: Secondary | ICD-10-CM | POA: Diagnosis not present

## 2023-06-10 DIAGNOSIS — Z95810 Presence of automatic (implantable) cardiac defibrillator: Secondary | ICD-10-CM | POA: Diagnosis not present

## 2023-06-10 DIAGNOSIS — E118 Type 2 diabetes mellitus with unspecified complications: Secondary | ICD-10-CM

## 2023-06-10 MED ORDER — CARVEDILOL 3.125 MG PO TABS: 3.1250 mg | ORAL_TABLET | Freq: Two times a day (BID) | ORAL | 3 refills | Status: AC

## 2023-06-10 NOTE — Patient Instructions (Signed)
 Medication Instructions:  Your physician has recommended you make the following change in your medication:  DECREASE CARVEDILOL  TO 3.125 MG TWICE DAILY.  *If you need a refill on your cardiac medications before your next appointment, please call your pharmacy*  Lab Work: NONE If you have labs (blood work) drawn today and your tests are completely normal, you will receive your results only by: MyChart Message (if you have MyChart) OR A paper copy in the mail If you have any lab test that is abnormal or we need to change your treatment, we will call you to review the results.  Testing/Procedures: NONE  Follow-Up: At [facility], you and your health needs are our priority.  As part of our continuing mission to provide you with exceptional heart care, our providers are all part of one team.  This team includes your primary Cardiologist (physician) and Advanced Practice Providers or APPs (Physician Assistants and Nurse Practitioners) who all work together to provide you with the care you need, when you need it.  Your next appointment:   1 year(s)  Provider:   You may see Luana Rumple, MD or one of the following Advanced Practice Providers on your designated Care Team:   Mertha Abrahams, [place] [address] [place], PA-C Suzann Riddle, NP Creighton Doffing, NP    We recommend signing up for the patient portal called "MyChart".  Sign up information is provided on this After Visit Summary.  MyChart is used to connect with patients for Virtual Visits (Telemedicine).  Patients are able to view lab/test results, encounter notes, upcoming appointments, etc.  Non-urgent messages can be sent to your provider as well.   To learn more about what you can do with MyChart, go to [email or web address].

## 2023-06-10 NOTE — Progress Notes (Signed)
 Patient ID: Diana Campbell, female   DOB: 1945/03/28, 78 y.o.   MRN: [id number]    Cardiology Office Note    Date:  06/10/2023   ID:  Diana, Campbell 05-Nov-1945, MRN [id number]  PCP:  Roxene Cora, PA-C  Cardiologist:  Magnus Schuller, M.D. Luana Rumple, MD   No chief complaint on file.   History of Present Illness:  Tersea SHAUNEE Campbell is a 78 y.o. female with hypertension, hypercholesterolemia, type 2 diabetes mellitus on insulin , possible obstructive sleep apnea and a remote history of resuscitated ventricular fibrillation arrest in June 2005, in the setting of nonischemic cardiomyopathy with left ventricular ejection fraction that has improved from 15% at diagnosis to around 45% on medical therapy, now 30-35% by most recent evaluation (echo March 2023). She has minor coronary atherosclerosis by previous angiography. She had a  Medtronic defibrillator/Sprint Fidelis lead, subsequently had a complete revision of her system with placement of a new right ventricular lead and dual-chamber generator in July 2012.  The Sprint Fidelis lead was not extracted (abandoned lead) so her device is not MRI conditional.  Underwent elective generator change out in October 2019.  It has been a long time since she has had an episode of heart failure exacerbation that has required more than a slight adjustment of her diuretics.  She has not been hospitalized since January 2020 (complicated E. faecalis urinary tract infection).    She is accompanied today by her daughter.  Mrs. Donate has had 3 episodes of "falling out" over the last year.  She does not remember any of them.  She was found unconscious by her family members.  None of these events were immediately witnessed.  The most recent one, which occurred about a month ago was when she was in the kitchen.  Her husband heard a call out his name from the other room and when he arrived in the kitchen he found her unconscious on the floor.  She had not  injured herself.  She had not had anything to eat that morning.  Her daughter points out that the patient has had low blood sugar recordings of which she was unaware.  She complains of fatigue rather than shortness of breath.  She can take care of household activities without difficulty.  She has not had any chest pain, lower extremity edema, orthopnea, PND, palpitations focal neurological deficits, claudication.  Her weight today is substantially lower than previously recorded, roughly 10 pounds less than her previous baseline.  Thoracic impedance (OptiVol) has not shown signs of fluid overload in the last 12 months.  Comprehensive device check today shows normal function.  She has a Medtronic Evera MRI device, but the device is not amenable to MRI due to the abandoned old defibrillator lead.  Presenting rhythm is atrial paced, ventricular sensed.  She is not pacemaker dependent.  She has 85% atrial pacing and only 0.2% ventricular pacing.  She has not had any episodes of high ventricular rates and has not received any tachycardia therapies.  She has extremely infrequent episodes of atrial tachycardia (3 minutes in the last 12 months, cumulative burden).  Estimated generator longevity is just under 3 years and all the lead parameters are normal.    Metabolic control is good.  Her most recent hemoglobin A1c was 6.8% in February.  Her most recent LDL cholesterol was 64 and she has good HDL and triglyceride levels.  She has normal renal function.  Potassium was borderline low on labs performed in  February.  She had a normal TSH and hemoglobin 13.7 in December 2024.   Past Medical History:  Diagnosis Date   Arthritis    Automatic implantable cardioverter-defibrillator in situ 07/2010   Cardiac abnormality    with defibrilator   Chronic combined systolic and diastolic CHF (congestive heart failure) (HCC) 09/04/2015   Diabetes mellitus    H/O cardiac arrest 2006   Hypercholesteremia    Hypertension     Neuropathy     Past Surgical History:  Procedure Laterality Date   CARDIAC CATHETERIZATION  06/22/2003   Severe nonischemic cardiomyopathy, 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   CARDIAC CATHETERIZATION  04/14/2000   Normal coronary arteries.   CARDIAC DEFIBRILLATOR PLACEMENT  07/21/2010   Medtronic Protecta XT DR, model#[id number], serial#[id number]   CARDIOVASCULAR STRESS TEST  04/05/2000   Scintigraphic evidence of mild LV dilatation and global LV dysfunction with an EF 41% with mild inferolateral ischemia.   CARPAL TUNNEL RELEASE Right 01/23/2016   Procedure: CARPAL TUNNEL RELEASE;  Surgeon: Darrin Emerald, MD;  Location: [facility];  Service: Orthopedics;  Laterality: Right;  pt knows to arrive at 6:15   COLONOSCOPY  03/30/2011   Procedure: COLONOSCOPY;  Surgeon: Beau Bound, MD;  Location: [facility] ENDO SUITE;  Service: Gastroenterology;  Laterality: N/A;   DORSAL COMPARTMENT RELEASE Right 08/11/2013   Procedure: RELEASE DORSAL COMPARTMENT (DEQUERVAIN);  Surgeon: Darrin Emerald, MD;  Location: [facility] ORS;  Service: Orthopedics;  Laterality: Right;   ICD GENERATOR CHANGEOUT N/A 10/29/2017   Procedure: ICD GENERATOR CHANGEOUT;  Surgeon: Luana Rumple, MD;  Location: [facility] INVASIVE CV LAB;  Service: Cardiovascular;  Laterality: N/A;   TRANSTHORACIC ECHOCARDIOGRAM  08/03/2011   EF 45-50%, mildly reduced LV systolic function.    Outpatient Medications Prior to Visit  Medication Sig Dispense Refill   ACCU-CHEK FASTCLIX LANCETS MISC 1 each by Does not apply route 2 (two) times daily. 200 each 5   ACCU-CHEK GUIDE test strip USE AS INSTRUCTED TO TEST BLOOD GLUCOSE FOUR TIMES DAILY 400 strip 1   acetaminophen  (TYLENOL ) 325 MG tablet Take 650 mg by mouth in the morning and at bedtime.     amoxicillin -clavulanate (AUGMENTIN) 875-125 MG tablet Take by mouth 2 (two) times daily.     aspirin  EC 81 MG tablet Take 1 tablet  (81 mg total) by mouth daily. 90 tablet 3   atorvastatin  (LIPITOR) 20 MG tablet Take 1 tablet (20 mg total) by mouth daily. 90 tablet 2   benzonatate (TESSALON) 200 MG capsule Take 200 mg by mouth 3 (three) times daily.     Blood Glucose Monitoring Suppl (ACCU-CHEK GUIDE) w/Device KIT Use as directed to check blood glucose four times daily 1 kit 0   fluconazole (DIFLUCAN) 150 MG tablet Take 150 mg by mouth daily.     insulin  aspart protamine - aspart (NOVOLOG  70/30 MIX) (70-30) 100 UNIT/ML FlexPen Inject 15 Units into the skin 2 (two) times daily with a meal. 30 mL 0   JARDIANCE  10 MG TABS tablet TAKE 1 TABLET BY MOUTH EVERY DAY 90 tablet 3   Lancets Misc. (ACCU-CHEK FASTCLIX LANCET) KIT 1 each by Does not apply route 2 (two) times daily. 1 kit 2   potassium chloride  SA (KLOR-CON  M) 20 MEQ tablet Take 1 tablet (20 mEq total) by mouth daily with lunch. 9 tablet 1   sacubitril -valsartan  (ENTRESTO ) 49-51 MG TAKE 1 TABLET BY MOUTH TWICE A DAY 180 tablet 3  spironolactone  (ALDACTONE ) 25 MG tablet TAKE 1/2 TABLET BY MOUTH EVERY DAY 45 tablet 11   carvedilol  (COREG ) 6.25 MG tablet Take 1 tablet (6.25 mg total) by mouth 2 (two) times daily. 180 tablet 3   No facility-administered medications prior to visit.     Allergies:   Patient has no known allergies.    PHYSICAL EXAM:   VS:  BP 117/67 (BP Location: Left Arm, Patient Position: Sitting)   Pulse 64   Ht 5\' 6"  (1.676 m)   Wt 85.2 kg   SpO2 98%   BMI 30.31 kg/m       General: Alert, oriented x3, no distress, obese.  Healthy defibrillator pocket Head: no evidence of trauma, PERRL, EOMI, no exophtalmos or lid lag, no myxedema, no xanthelasma; normal ears, nose and oropharynx Neck: normal jugular venous pulsations and no hepatojugular reflux; brisk carotid pulses without delay and no carotid bruits Chest: clear to auscultation, no signs of consolidation by percussion or palpation, normal fremitus, symmetrical and full respiratory  excursions Cardiovascular: normal position and quality of the apical impulse, regular rhythm, normal first and second heart sounds, no murmurs, rubs or gallops Abdomen: no tenderness or distention, no masses by palpation, no abnormal pulsatility or arterial bruits, normal bowel sounds, no hepatosplenomegaly Extremities: no clubbing, cyanosis or edema; 2+ radial, ulnar and brachial pulses bilaterally; 2+ right femoral, posterior tibial and dorsalis pedis pulses; 2+ left femoral, posterior tibial and dorsalis pedis pulses; no subclavian or femoral bruits Neurological: grossly nonfocal Psych: Normal mood and affect     Wt Readings from Last 3 Encounters:  06/10/23 85.2 kg  03/08/23 90.9 kg  12/23/22 91.8 kg      Studies/Labs Reviewed:   ECHO 03/21/2021:   1. Left ventricular ejection fraction, by estimation, is 30 to 35%. The  left ventricle has moderately decreased function. The left ventricle  demonstrates global hypokinesis. The left ventricular internal cavity size  was mildly dilated. There is mild  left ventricular hypertrophy. Left ventricular diastolic parameters are  consistent with Grade I diastolic dysfunction (impaired relaxation).  Elevated left atrial pressure.   2. Right ventricular systolic function is normal. The right ventricular  size is normal.   3. Left atrial size was mildly dilated.   4. The mitral valve is normal in structure. Trivial mitral valve  regurgitation. No evidence of mitral stenosis.   5. The aortic valve is tricuspid. Aortic valve regurgitation is not  visualized. Aortic valve sclerosis is present, with no evidence of aortic  valve stenosis.   6. The inferior vena cava is normal in size with greater than 50%  respiratory variability, suggesting right atrial pressure of 3 mmHg.   EKG: Not ordered today.  ECG from 12/23/2022 is personally reviewed and shows atrial paced ventricular sensed rhythm with frequent PVCs.  The native QRS complexes  brought at 124 ms in a pattern consistent with atypical left bundle branch block.  QTc was normal at 462 ms.  EKG Interpretation Date/Time:    Ventricular Rate:    PR Interval:    QRS Duration:    QT Interval:    QTC Calculation:   R Axis:      Text Interpretation:          Recent Labs: 03/02/2023: ALT 10; BUN 10; Creatinine, Ser 0.87; Potassium 3.4; Sodium 146   Lipid Panel    Component Value Date/Time   CHOL 122 08/25/2022 0849   TRIG 58 08/25/2022 0849   HDL 54 08/25/2022 0849  CHOLHDL 2.3 08/25/2022 0849   CHOLHDL 3.5 08/28/2019 0851   VLDL 14 09/26/2015 0856   LDLCALC 55 08/25/2022 0849   LDLCALC 141 (H) 08/28/2019 0851   ASSESSMENT:    1. Chronic combined systolic and diastolic CHF (congestive heart failure) (HCC)   2. Essential hypertension   3. ICD (implantable cardioverter-defibrillator) in place   4. Type 2 diabetes mellitus with complication, with long-term current use of insulin  (HCC)   5. PAF (paroxysmal atrial fibrillation) (HCC)   6. Pure hypercholesterolemia       PLAN:  In order of problems listed above:  CMP/CHF:  NYHA class 2-3, euvolemic by exam and by Optivol.  Most recent ejection fraction 30-35% by echo performed in March 2023.  She is on carvedilol  and Entresto , Jardiance  andspironolactone in maximum tolerated doses.  Blood pressures in normal range.  Weight is down. HTN: Well-controlled. ICD: Device function.  There have been no episodes of arrhythmia that could explain her "falling out" events.  Note that she has an abandoned lead and cannot unfortunately have MRIs even though she has an MRI conditional system.  Option for future CRT upgrade if clinical status deteriorates with worsening heart failure NYHA functional class III-IV. DM: well-controlled.  She is on insulin  and this puts her at risk for hypoglycemia.  I wonder whether her episodes of "falling out" could actually have been hypoglycemia and whether she might have some degree of  hypoglycemia unawareness due to her beta-blocker.  Will reduce the dose of carvedilol . AFib: Extremely infrequent and brief only 3 minutes of mode switch in the last 12 months.  Anticoagulation is not indicated. HLP: All lipid parameters are excellent. Fatigue: Over the years we have suspected that she may have sleep apnea but I do not think shehas ever undergone a formal assessment.    Medication Adjustments/Labs and Tests Ordered: Current medicines are reviewed at length with the patient today.  Concerns regarding medicines are outlined above.  Medication changes, Labs and Tests ordered today are listed in the Patient Instructions below. Patient Instructions  Medication Instructions:  Your physician has recommended you make the following change in your medication:  DECREASE CARVEDILOL  TO 3.125 MG TWICE DAILY.  *If you need a refill on your cardiac medications before your next appointment, please call your pharmacy*  Lab Work: NONE If you have labs (blood work) drawn today and your tests are completely normal, you will receive your results only by: MyChart Message (if you have MyChart) OR A paper copy in the mail If you have any lab test that is abnormal or we need to change your treatment, we will call you to review the results.  Testing/Procedures: NONE  Follow-Up: At [facility], you and your health needs are our priority.  As part of our continuing mission to provide you with exceptional heart care, our providers are all part of one team.  This team includes your primary Cardiologist (physician) and Advanced Practice Providers or APPs (Physician Assistants and Nurse Practitioners) who all work together to provide you with the care you need, when you need it.  Your next appointment:   1 year(s)  Provider:   You may see Luana Rumple, MD or one of the following Advanced Practice Providers on your designated Care Team:   Mertha Abrahams, [place] [address] [place],  PA-C Suzann Riddle, NP Creighton Doffing, NP    We recommend signing up for the patient portal called "MyChart".  Sign up information is provided on this After Visit  Summary.  MyChart is used to connect with patients for Virtual Visits (Telemedicine).  Patients are able to view lab/test results, encounter notes, upcoming appointments, etc.  Non-urgent messages can be sent to your provider as well.   To learn more about what you can do with MyChart, go to [email or web address].           Signed, Luana Rumple, MD  06/10/2023 1:34 PM    [facility] [address] [place], [place], [place]  [address] Phone: [phone]; Fax: [phone]

## 2023-06-11 ENCOUNTER — Telehealth: Payer: Self-pay

## 2023-06-11 NOTE — Telephone Encounter (Signed)
 Tried to return pt's call, left a message requesting pt return call to the office.

## 2023-06-11 NOTE — Progress Notes (Signed)
 Remote ICD transmission.

## 2023-06-17 DIAGNOSIS — Z6830 Body mass index (BMI) 30.0-30.9, adult: Secondary | ICD-10-CM | POA: Diagnosis not present

## 2023-06-17 DIAGNOSIS — E6609 Other obesity due to excess calories: Secondary | ICD-10-CM | POA: Diagnosis not present

## 2023-06-17 DIAGNOSIS — M48061 Spinal stenosis, lumbar region without neurogenic claudication: Secondary | ICD-10-CM | POA: Diagnosis not present

## 2023-06-17 DIAGNOSIS — B3731 Acute candidiasis of vulva and vagina: Secondary | ICD-10-CM | POA: Diagnosis not present

## 2023-06-24 ENCOUNTER — Encounter: Payer: Self-pay | Admitting: Cardiovascular Disease

## 2023-06-28 ENCOUNTER — Ambulatory Visit: Attending: Cardiovascular Disease

## 2023-06-28 DIAGNOSIS — Z95810 Presence of automatic (implantable) cardiac defibrillator: Secondary | ICD-10-CM

## 2023-06-28 DIAGNOSIS — I5042 Chronic combined systolic (congestive) and diastolic (congestive) heart failure: Secondary | ICD-10-CM | POA: Diagnosis not present

## 2023-06-29 DIAGNOSIS — E119 Type 2 diabetes mellitus without complications: Secondary | ICD-10-CM | POA: Diagnosis not present

## 2023-06-29 DIAGNOSIS — Z794 Long term (current) use of insulin: Secondary | ICD-10-CM | POA: Diagnosis not present

## 2023-06-30 ENCOUNTER — Telehealth: Payer: Self-pay

## 2023-06-30 LAB — COMPREHENSIVE METABOLIC PANEL WITH GFR
ALT: 9 IU/L (ref 0–32)
AST: 11 IU/L (ref 0–40)
Albumin: 3.7 g/dL — ABNORMAL LOW (ref 3.8–4.8)
Alkaline Phosphatase: 78 IU/L (ref 44–121)
BUN/Creatinine Ratio: 11 — ABNORMAL LOW (ref 12–28)
BUN: 11 mg/dL (ref 8–27)
Bilirubin Total: 0.3 mg/dL (ref 0.0–1.2)
CO2: 24 mmol/L (ref 20–29)
Calcium: 9.3 mg/dL (ref 8.7–10.3)
Chloride: 105 mmol/L (ref 96–106)
Creatinine, Ser: 0.98 mg/dL (ref 0.57–1.00)
Globulin, Total: 2.4 g/dL (ref 1.5–4.5)
Glucose: 261 mg/dL — ABNORMAL HIGH (ref 70–99)
Potassium: 3.7 mmol/L (ref 3.5–5.2)
Sodium: 143 mmol/L (ref 134–144)
Total Protein: 6.1 g/dL (ref 6.0–8.5)
eGFR: 59 mL/min/1.73 — ABNORMAL LOW

## 2023-06-30 NOTE — Telephone Encounter (Signed)
 Remote ICM transmission received.  Attempted call to patient regarding ICM remote transmission and no answer.

## 2023-06-30 NOTE — Progress Notes (Signed)
 EPIC Encounter for ICM Monitoring  Patient Name: Diana Campbell is a 78 y.o. female Date: 06/30/2023 Primary Care Physican: Earlene Gleason Primary Cardiologist: Loetta Ringer Electrophysiologist: Croitoru 12/23/2022 Office Weight: 202 lbs 03/08/2023 Office Weight: 200 lbs 05/25/2023 Weight: 200 lbs at home 06/10/2023 Office Weight: 187 lbs   Clinical Status (10-Jun-2023 to 28-Jun-2023) Time in AT/AF   0.0 hr/day (0.0%)                                                        Attempted call to patient and unable to reach.   Transmission results reviewed.     Optivol thoracic impedance suggesting normal fluid levels within the last month.   Prescribed: No diuretic (Previously on Furosemide  40 mg 1 tablet as needed) Spironolactone  25 mg take 0.5 tablet (12.5 mg total) by mouth every other day.   Labs: 06/29/2023 Creatinine 0.98, BUN 11, Potassium 3.7, Sodium 143, GFR 59 03/02/2023 Creatinine 0.87, BUN 10, Potassium 3.4, Sodium 146, GFR 68 A complete set of results can be found in Results Review.   Recommendations:  Unable to reach.     Follow-up plan: ICM clinic phone appointment on 08/16/2023.   91 day device clinic remote transmission 07/28/2023.     EP/Cardiology Office Visits:  Recall needed with Dr Alvis Ba or EP APP for 05/2024.     Recall 08/11/2023 with Dr Alvenia Aus.   Copy of ICM check sent to Dr. Alvis Ba.     3 month ICM trend: 06/28/2023.    12-14 Month ICM trend:     Almyra Jain, RN 06/30/2023 12:37 PM

## 2023-07-07 ENCOUNTER — Ambulatory Visit (INDEPENDENT_AMBULATORY_CARE_PROVIDER_SITE_OTHER): Payer: Medicare PPO | Admitting: "Endocrinology

## 2023-07-07 ENCOUNTER — Encounter: Payer: Self-pay | Admitting: "Endocrinology

## 2023-07-07 VITALS — BP 104/52 | HR 60 | Ht 66.0 in | Wt 193.4 lb

## 2023-07-07 DIAGNOSIS — E119 Type 2 diabetes mellitus without complications: Secondary | ICD-10-CM | POA: Diagnosis not present

## 2023-07-07 DIAGNOSIS — E782 Mixed hyperlipidemia: Secondary | ICD-10-CM | POA: Diagnosis not present

## 2023-07-07 DIAGNOSIS — I10 Essential (primary) hypertension: Secondary | ICD-10-CM | POA: Diagnosis not present

## 2023-07-07 DIAGNOSIS — Z794 Long term (current) use of insulin: Secondary | ICD-10-CM

## 2023-07-07 LAB — POCT GLYCOSYLATED HEMOGLOBIN (HGB A1C)

## 2023-07-07 LAB — HEMOGLOBIN A1C: Hemoglobin A1C: 8.4

## 2023-07-07 MED ORDER — FREESTYLE LIBRE 3 READER DEVI: 1.0000 | Freq: Once | 0 refills | Status: DC | PRN

## 2023-07-07 MED ORDER — INSULIN ASPART PROT & ASPART (70-30 MIX) 100 UNIT/ML PEN
10.0000 [IU] | PEN_INJECTOR | Freq: Two times a day (BID) | SUBCUTANEOUS | 1 refills | Status: DC
Start: 2023-07-07 — End: 2023-08-16

## 2023-07-07 MED ORDER — FREESTYLE LIBRE 3 PLUS SENSOR MISC: 2 refills | Status: DC

## 2023-07-07 NOTE — Progress Notes (Signed)
 07/07/2023  Endocrinology follow-up note  Subjective:    Patient ID: Diana Campbell, female    DOB: 08/22/1945, PCP Roxene Cora, PA-C   Past Medical History:  Diagnosis Date   Arthritis    Automatic implantable cardioverter-defibrillator in situ 07/2010   Cardiac abnormality    with defibrilator   Chronic combined systolic and diastolic CHF (congestive heart failure) (HCC) 09/04/2015   Diabetes mellitus    H/O cardiac arrest 2006   Hypercholesteremia    Hypertension    Neuropathy    Past Surgical History:  Procedure Laterality Date   CARDIAC CATHETERIZATION  06/22/2003   Severe nonischemic cardiomyopathy, 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   CARDIAC CATHETERIZATION  04/14/2000   Normal coronary arteries.   CARDIAC DEFIBRILLATOR PLACEMENT  07/21/2010   Medtronic Protecta XT DR, model#[id number], serial#[id number]   CARDIOVASCULAR STRESS TEST  04/05/2000   Scintigraphic evidence of mild LV dilatation and global LV dysfunction with an EF 41% with mild inferolateral ischemia.   CARPAL TUNNEL RELEASE Right 01/23/2016   Procedure: CARPAL TUNNEL RELEASE;  Surgeon: Darrin Emerald, MD;  Location: [facility];  Service: Orthopedics;  Laterality: Right;  pt knows to arrive at 6:15   COLONOSCOPY  03/30/2011   Procedure: COLONOSCOPY;  Surgeon: Beau Bound, MD;  Location: [facility];  Service: Gastroenterology;  Laterality: N/A;   DORSAL COMPARTMENT RELEASE Right 08/11/2013   Procedure: RELEASE DORSAL COMPARTMENT (DEQUERVAIN);  Surgeon: Darrin Emerald, MD;  Location: [facility];  Service: Orthopedics;  Laterality: Right;   ICD GENERATOR CHANGEOUT N/A 10/29/2017   Procedure: ICD GENERATOR CHANGEOUT;  Surgeon: Luana Rumple, MD;  Location: [facility];  Service: Cardiovascular;  Laterality: N/A;   TRANSTHORACIC ECHOCARDIOGRAM  08/03/2011   EF 45-50%, mildly reduced LV systolic function.    Social History   Socioeconomic History   Marital status: Married    Spouse name: Not on file   Number of children: Not on file   Years of education: Not on file   Highest education level: Not on file  Occupational History   Not on file  Tobacco Use   Smoking status: Never   Smokeless tobacco: Current    Types: Snuff   Tobacco comments:    3 dips per day  Vaping Use   Vaping status: Never Used  Substance and Sexual Activity   Alcohol use: No   Drug use: No   Sexual activity: Yes    Birth control/protection: None  Other Topics Concern   Not on file  Social History Narrative   Not on file   Social Drivers of Health   Financial Resource Strain: Not on file  Food Insecurity: Not on file  Transportation Needs: Not on file  Physical Activity: Not on file  Stress: Not on file  Social Connections: Not on file   Outpatient Encounter Medications as of 07/07/2023  Medication Sig   Continuous Glucose Receiver (FREESTYLE LIBRE 3 READER) DEVI 1 Piece by Does not apply route once as needed for up to 1 dose.   Continuous Glucose Sensor (FREESTYLE LIBRE 3 PLUS SENSOR) MISC Change sensor every 15 days.   [DISCONTINUED] insulin  aspart protamine - aspart (NOVOLOG  70/30 MIX) (70-30) 100 UNIT/ML FlexPen Inject 15 Units into the skin 2 (two) times daily with a meal. (Patient taking differently: Inject 5-15 Units into the skin 2 (two) times daily with a meal.)   ACCU-CHEK FASTCLIX LANCETS MISC  1 each by Does not apply route 2 (two) times daily.   ACCU-CHEK GUIDE test strip USE AS INSTRUCTED TO TEST BLOOD GLUCOSE FOUR TIMES DAILY   acetaminophen  (TYLENOL ) 325 MG tablet Take 650 mg by mouth in the morning and at bedtime.   amoxicillin -clavulanate (AUGMENTIN) 875-125 MG tablet Take by mouth 2 (two) times daily.   aspirin  EC 81 MG tablet Take 1 tablet (81 mg total) by mouth daily.   atorvastatin  (LIPITOR) 20 MG tablet Take 1 tablet (20 mg total) by mouth daily.   benzonatate (TESSALON) 200 MG  capsule Take 200 mg by mouth 3 (three) times daily. (Patient not taking: Reported on 07/07/2023)   Blood Glucose Monitoring Suppl (ACCU-CHEK GUIDE) w/Device KIT Use as directed to check blood glucose four times daily   carvedilol  (COREG ) 3.125 MG tablet Take 1 tablet (3.125 mg total) by mouth 2 (two) times daily.   fluconazole (DIFLUCAN) 150 MG tablet Take 150 mg by mouth daily. (Patient not taking: Reported on 07/07/2023)   insulin  aspart protamine - aspart (NOVOLOG  70/30 MIX) (70-30) 100 UNIT/ML FlexPen Inject 10 Units into the skin 2 (two) times daily with a meal.   JARDIANCE  10 MG TABS tablet TAKE 1 TABLET BY MOUTH EVERY DAY   Lancets Misc. (ACCU-CHEK FASTCLIX LANCET) KIT 1 each by Does not apply route 2 (two) times daily.   potassium chloride  SA (KLOR-CON  M) 20 MEQ tablet Take 1 tablet (20 mEq total) by mouth daily with lunch.   sacubitril -valsartan  (ENTRESTO ) 49-51 MG TAKE 1 TABLET BY MOUTH TWICE A DAY   spironolactone  (ALDACTONE ) 25 MG tablet TAKE 1/2 TABLET BY MOUTH EVERY DAY   No facility-administered encounter medications on file as of 07/07/2023.   ALLERGIES: No Known Allergies VACCINATION STATUS: Immunization History  Administered Date(s) Administered   Influenza-Unspecified 11/19/2017    Diabetes She is returns for follow-up in the management of her currently uncontrolled type 2 diabetes, hyperlipidemia, hypertension.   Onset time: She was diagnosed with diabetes at approximately 78 years of age.  She is accompanied by her daughter to this clinic today.  In the interim, her insulin  was switched to NovoLog  70/30, however did not utilize it properly.  Her meter shows monitoring activity less than once a day 27 readings in the last 30 days averaging 216.  Her point-of-care A1c is 8.4% increasing from 6.8%.  She did not document any hypoglycemia.  She does not follow the  meal routine discussed and recommended during prior visits.  She remains on Jardiance  10 mg p.o. daily at breakfast.      Jardiance  was started by her cardiologist.  She has cardiac defibrillator in place.   Diabetic complications include nephropathy. Risk factors for coronary artery disease include diabetes mellitus, dyslipidemia, hypertension, sedentary lifestyle and tobacco exposure. Her weight is fluctuating minimally, complains of poor appetite.   She generally follows a healthy diet, regularly eats  junk food.  She has not had a previous visit with a dietitian (She declined a referral to CDE.)  She has controlled her lipid panel currently on Lipitor 20 mg p.o. nightly.     An ACE inhibitor/angiotensin II receptor blocker is being taken. Eye exam is current.    Hyperlipidemia This is a chronic problem. The current episode started more than 1 year ago. Pertinent negatives include no chest pain, myalgias or shortness of breath. Current antihyperlipidemic treatment includes statins. Risk factors for coronary artery disease include dyslipidemia, diabetes mellitus and a sedentary lifestyle.  Her previsit labs show controlled  LDL.    Hypertension This is a chronic problem. The current episode started more than 1 year ago. The problem is controlled. Pertinent negatives include no chest pain, headaches, palpitations or shortness of breath. Past treatments include ACE inhibitors. Hypertensive end-organ damage includes kidney disease.    Review of systems: Limited as above.   Objective:    BP (!) 104/52   Pulse 60   Ht 5' 6 (1.676 m)   Wt 193 lb 6.4 oz (87.7 kg)   BMI 31.22 kg/m   Wt Readings from Last 3 Encounters:  07/07/23 193 lb 6.4 oz (87.7 kg)  06/10/23 187 lb 12.8 oz (85.2 kg)  03/08/23 200 lb 6.4 oz (90.9 kg)     Physical Exam- Limited  Constitutional:  Body mass index is 31.22 kg/m. , not in acute distress, normal state of mind Eyes:  EOMI, no exophthalmos       Latest Ref Rng & Units 06/29/2023   10:57 AM 03/02/2023   10:09 AM 08/25/2022    8:49 AM  CMP  Glucose 70 - 99 mg/dL 914   89  782   BUN 8 - 27 mg/dL 11  10  16    Creatinine 0.57 - 1.00 mg/dL 9.56  2.13  0.86   Sodium 134 - 144 mmol/L 143  146  144   Potassium 3.5 - 5.2 mmol/L 3.7  3.4  4.3   Chloride 96 - 106 mmol/L 105  106  108   CO2 20 - 29 mmol/L 24  26  21    Calcium  8.7 - 10.3 mg/dL 9.3  9.4  9.4   Total Protein 6.0 - 8.5 g/dL 6.1  6.5  6.5   Total Bilirubin 0.0 - 1.2 mg/dL 0.3  0.4  0.4   Alkaline Phos 44 - 121 IU/L 78  77  76   AST 0 - 40 IU/L 11  16  13    ALT 0 - 32 IU/L 9  10  9      Lab Results  Component Value Date   HGBA1C 6.8 03/08/2023   HGBA1C 6.0 09/04/2022   HGBA1C 6.5 05/05/2022   MICROALBUR 0.7 08/28/2019   MICROALBUR 0.3 04/14/2016   Lipid Panel     Component Value Date/Time   CHOL 122 08/25/2022 0849   TRIG 58 08/25/2022 0849   HDL 54 08/25/2022 0849   CHOLHDL 2.3 08/25/2022 0849   CHOLHDL 3.5 08/28/2019 0851   VLDL 14 09/26/2015 0856   LDLCALC 55 08/25/2022 0849   LDLCALC 141 (H) 08/28/2019 0851     Assessment & Plan:   1. Type 2 diabetes mellitus with long-term current use of insulin  (HCC)  -Her diabetes is complicated by cardiomyopathy and she remains at a high risk for more acute and chronic complications of diabetes which include CAD, CVA, CKD, retinopathy, and neuropathy. These are all discussed in detail with the patient.  Patient presents with loss of control in her glycemic profile and point of A1c of 8.4% increasing from 6.8%.   She presents with average blood glucose 260 for the last 30 days monitoring only once a day. Recent labs reviewed.   - I have re-counseled the patient on diet management and weight loss  by adopting a carbohydrate restricted / protein rich  Diet.  She did not engage with lifestyle medicine optimally.  - Suggestion is made for her to avoid simple carbohydrates  from her diet including Cakes, Sweet Desserts, Ice Cream, Soda (diet and regular), Sweet Tea, Candies, Chips, Cookies,  Store Bought Juices, Alcohol , Artificial Sweeteners,   Coffee Creamer, and Sugar-free Products, Lemonade. This will help patient to have more stable blood glucose profile and potentially avoid unintended weight gain.  - Patient is advised to stick to a routine mealtimes to eat 3 meals a day and avoid unnecessary snacks specially with ultra processed foods.    - She declined referral to a  CDE for individualized DM education.  - I have approached patient with the following individualized plan to manage diabetes and patient agrees.   - Patient  worries a lot about hypoglycemia.  Her daughter recently moved in with her and is offering to help. - I advised the family to monitor her blood glucose 4 times a day before meals and at bedtime. -I have adjusted her NovoLog  70/30 to 10 units with breakfast and 10 units with supper for Premeal blood glucose readings above 90 mg per DL.   - She would greatly benefit from a CGM.  I discussed and prescribed the freestyle libre device for her. She is advised to continue Jardiance  10 mg p.o. daily at breakfast.    - If her insurance does not pay for a CGM, she is advised and agrees to continue monitoring of blood glucose 4 times a day-before meals and at bedtime.    -Patient is encouraged to call clinic for blood glucose levels less than 70 or above 200 mg /dl.  -Her most recent labs show normal renal function.  She would have benefited from low-dose metformin, however she wishes to avoid it at this time.    - Patient specific target  for A1c; LDL, HDL, Triglycerides,were discussed in detail.  2) BP/HTN:  -Her blood pressure is controlled to target she is currently on carvedilol  12.5 mg p.o. twice daily, spironolactone  25 mg p.o. once a day.   3) Lipids/HPL: Recent lipid panel showed improved lipid panel with LDL at 55 improving from 127.    She is advised to continue Lipitor 20 mg p.o. nightly.       4)  Weight/Diet: Her BMI 31.22 .   This is complicating her diabetes care.  She is a candidate for modest  weight loss.    She declined CDE consult . exercise, and carbohydrates information provided.  5) Chronic Care/Health Maintenance:  -Patient is on ACEI/ARB and Statin medications and encouraged to continue to follow up with Ophthalmology, Podiatrist at least yearly or according to recommendations, and advised to  stay away from smoking. I have recommended yearly flu vaccine and pneumonia vaccination at least every 5 years; moderate intensity exercise for up to 150 minutes weekly; and  sleep for at least 7 hours a day.  -She was not ready for foot exam today.  She will be approached again.   - I advised patient to maintain close follow up with Roxene Cora, PA-C for primary care needs.   I spent  41  minutes in the care of the patient today including review of labs from CMP, Lipids, Thyroid  Function, Hematology (current and previous including abstractions from other facilities); face-to-face time discussing  her blood glucose readings/logs, discussing hypoglycemia and hyperglycemia episodes and symptoms, medications doses, her options of short and long term treatment based on the latest standards of care / guidelines;  discussion about incorporating lifestyle medicine;  and documenting the encounter. Risk reduction counseling performed per USPSTF guidelines to reduce  obesity and cardiovascular risk factors.     Please refer to Patient Instructions for Blood Glucose  Monitoring and Insulin /Medications Dosing Guide  in media tab for additional information. Please  also refer to  Patient Self Inventory in the Media  tab for reviewed elements of pertinent patient history.  Diana Campbell participated in the discussions, expressed understanding, and voiced agreement with the above plans.  All questions were answered to her satisfaction. she is encouraged to contact clinic should she have any questions or concerns prior to her return visit.     Follow up plan: -Return in about 4 weeks  (around 08/04/2023) for F/U with Meter/CGM Rommie Coats Only - no Labs.  Kalvin Orf, MD Phone: [phone]  Fax: [phone]  -  This note was partially dictated with voice recognition software. Similar sounding words can be transcribed inadequately or may not  be corrected upon review.  07/07/2023, 10:52 AM

## 2023-07-07 NOTE — Patient Instructions (Signed)

## 2023-07-26 ENCOUNTER — Other Ambulatory Visit: Payer: Self-pay | Admitting: Cardiovascular Disease

## 2023-07-28 ENCOUNTER — Ambulatory Visit (INDEPENDENT_AMBULATORY_CARE_PROVIDER_SITE_OTHER): Payer: Medicare PPO

## 2023-07-28 DIAGNOSIS — I428 Other cardiomyopathies: Secondary | ICD-10-CM

## 2023-07-29 LAB — CUP PACEART REMOTE DEVICE CHECK
Battery Remaining Longevity: 32 mo
Battery Voltage: 2.96 V
Brady Statistic AP VP Percent: 0.26 %
Brady Statistic AP VS Percent: 81.61 %
Brady Statistic AS VP Percent: 0.02 %
Brady Statistic AS VS Percent: 18.12 %
Brady Statistic RA Percent Paced: 75.15 %
Brady Statistic RV Percent Paced: 0.31 %
Date Time Interrogation Session: 20250709033624
HighPow Impedance: 45 Ohm
HighPow Impedance: 60 Ohm
Implantable Lead Connection Status: 753985
Implantable Lead Connection Status: 753985
Implantable Lead Implant Date: 20120702
Implantable Lead Implant Date: 20120702
Implantable Lead Location: 753859
Implantable Lead Location: 753860
Implantable Lead Model: 5076
Implantable Lead Model: 6947
Implantable Pulse Generator Implant Date: 20191011
Lead Channel Impedance Value: 342 Ohm
Lead Channel Impedance Value: 437 Ohm
Lead Channel Impedance Value: 494 Ohm
Lead Channel Pacing Threshold Amplitude: 0.75 V
Lead Channel Pacing Threshold Amplitude: 0.75 V
Lead Channel Pacing Threshold Pulse Width: 0.4 ms
Lead Channel Pacing Threshold Pulse Width: 0.4 ms
Lead Channel Sensing Intrinsic Amplitude: 10.625 mV
Lead Channel Sensing Intrinsic Amplitude: 10.625 mV
Lead Channel Sensing Intrinsic Amplitude: 2.375 mV
Lead Channel Sensing Intrinsic Amplitude: 2.375 mV
Lead Channel Setting Pacing Amplitude: 1.5 V
Lead Channel Setting Pacing Amplitude: 2 V
Lead Channel Setting Pacing Pulse Width: 0.4 ms
Lead Channel Setting Sensing Sensitivity: 0.3 mV
Zone Setting Status: 755011
Zone Setting Status: 755011

## 2023-08-02 ENCOUNTER — Ambulatory Visit: Payer: Self-pay | Admitting: Cardiovascular Disease

## 2023-08-03 DIAGNOSIS — Z20828 Contact with and (suspected) exposure to other viral communicable diseases: Secondary | ICD-10-CM | POA: Diagnosis not present

## 2023-08-03 DIAGNOSIS — B3731 Acute candidiasis of vulva and vagina: Secondary | ICD-10-CM | POA: Diagnosis not present

## 2023-08-03 DIAGNOSIS — M48062 Spinal stenosis, lumbar region with neurogenic claudication: Secondary | ICD-10-CM | POA: Diagnosis not present

## 2023-08-03 DIAGNOSIS — E1129 Type 2 diabetes mellitus with other diabetic kidney complication: Secondary | ICD-10-CM | POA: Diagnosis not present

## 2023-08-03 DIAGNOSIS — I420 Dilated cardiomyopathy: Secondary | ICD-10-CM | POA: Diagnosis not present

## 2023-08-03 DIAGNOSIS — I480 Paroxysmal atrial fibrillation: Secondary | ICD-10-CM | POA: Diagnosis not present

## 2023-08-03 DIAGNOSIS — R053 Chronic cough: Secondary | ICD-10-CM | POA: Diagnosis not present

## 2023-08-03 DIAGNOSIS — R6889 Other general symptoms and signs: Secondary | ICD-10-CM | POA: Diagnosis not present

## 2023-08-03 DIAGNOSIS — J069 Acute upper respiratory infection, unspecified: Secondary | ICD-10-CM | POA: Diagnosis not present

## 2023-08-11 ENCOUNTER — Encounter: Payer: Self-pay | Admitting: "Endocrinology

## 2023-08-11 ENCOUNTER — Ambulatory Visit: Admitting: "Endocrinology

## 2023-08-11 VITALS — BP 102/50 | HR 52 | Ht 66.0 in | Wt 190.6 lb

## 2023-08-11 DIAGNOSIS — E782 Mixed hyperlipidemia: Secondary | ICD-10-CM

## 2023-08-11 DIAGNOSIS — E119 Type 2 diabetes mellitus without complications: Secondary | ICD-10-CM

## 2023-08-11 DIAGNOSIS — Z794 Long term (current) use of insulin: Secondary | ICD-10-CM | POA: Diagnosis not present

## 2023-08-11 DIAGNOSIS — I10 Essential (primary) hypertension: Secondary | ICD-10-CM | POA: Diagnosis not present

## 2023-08-11 NOTE — Patient Instructions (Signed)

## 2023-08-11 NOTE — Progress Notes (Signed)
 08/11/2023  Endocrinology follow-up note  Subjective:    Patient ID: Diana Campbell, female    DOB: 1945/04/02, PCP Leonce Lucie PARAS, PA-C   Past Medical History:  Diagnosis Date   Arthritis    Automatic implantable cardioverter-defibrillator in situ 07/2010   Cardiac abnormality    with defibrilator   Chronic combined systolic and diastolic CHF (congestive heart failure) (HCC) 09/04/2015   Diabetes mellitus    H/O cardiac arrest 2006   Hypercholesteremia    Hypertension    Neuropathy    Past Surgical History:  Procedure Laterality Date   CARDIAC CATHETERIZATION  06/22/2003   Severe nonischemic cardiomyopathy, 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   CARDIAC CATHETERIZATION  04/14/2000   Normal coronary arteries.   CARDIAC DEFIBRILLATOR PLACEMENT  07/21/2010   Medtronic Protecta XT DR, model#[id number], serial#[id number]   CARDIOVASCULAR STRESS TEST  04/05/2000   Scintigraphic evidence of mild LV dilatation and global LV dysfunction with an EF 41% with mild inferolateral ischemia.   CARPAL TUNNEL RELEASE Right 01/23/2016   Procedure: CARPAL TUNNEL RELEASE;  Surgeon: Taft FORBES Minerva, MD;  Location: [facility];  Service: Orthopedics;  Laterality: Right;  pt knows to arrive at 6:15   COLONOSCOPY  03/30/2011   Procedure: COLONOSCOPY;  Surgeon: Oneil DELENA Budge, MD;  Location: [facility];  Service: Gastroenterology;  Laterality: N/A;   DORSAL COMPARTMENT RELEASE Right 08/11/2013   Procedure: RELEASE DORSAL COMPARTMENT (DEQUERVAIN);  Surgeon: Taft FORBES Minerva, MD;  Location: [facility];  Service: Orthopedics;  Laterality: Right;   ICD GENERATOR CHANGEOUT N/A 10/29/2017   Procedure: ICD GENERATOR CHANGEOUT;  Surgeon: Francyne Headland, MD;  Location: [facility];  Service: Cardiovascular;  Laterality: N/A;   TRANSTHORACIC ECHOCARDIOGRAM  08/03/2011   EF 45-50%, mildly reduced LV systolic function.    Social History   Socioeconomic History   Marital status: Married    Spouse name: Not on file   Number of children: Not on file   Years of education: Not on file   Highest education level: Not on file  Occupational History   Not on file  Tobacco Use   Smoking status: Never   Smokeless tobacco: Current    Types: Snuff   Tobacco comments:    3 dips per day  Vaping Use   Vaping status: Never Used  Substance and Sexual Activity   Alcohol use: No   Drug use: No   Sexual activity: Yes    Birth control/protection: None  Other Topics Concern   Not on file  Social History Narrative   Not on file   Social Drivers of Health   Financial Resource Strain: Not on file  Food Insecurity: Not on file  Transportation Needs: Not on file  Physical Activity: Not on file  Stress: Not on file  Social Connections: Not on file   Outpatient Encounter Medications as of 08/11/2023  Medication Sig   donepezil (ARICEPT) 5 MG tablet Take 5 mg by mouth daily.   ACCU-CHEK FASTCLIX LANCETS MISC 1 each by Does not apply route 2 (two) times daily.   ACCU-CHEK GUIDE test strip USE AS INSTRUCTED TO TEST BLOOD GLUCOSE FOUR TIMES DAILY   acetaminophen  (TYLENOL ) 325 MG tablet Take 650 mg by mouth in the morning and at bedtime.   amoxicillin -clavulanate (AUGMENTIN) 875-125 MG tablet Take by mouth 2 (two) times daily.   aspirin  EC 81 MG tablet Take 1 tablet (81 mg total)  by mouth daily.   atorvastatin  (LIPITOR) 20 MG tablet Take 1 tablet (20 mg total) by mouth daily.   benzonatate (TESSALON) 200 MG capsule Take 200 mg by mouth 3 (three) times daily. (Patient not taking: Reported on 07/07/2023)   Blood Glucose Monitoring Suppl (ACCU-CHEK GUIDE) w/Device KIT Use as directed to check blood glucose four times daily   carvedilol  (COREG ) 3.125 MG tablet Take 1 tablet (3.125 mg total) by mouth 2 (two) times daily.   Continuous Glucose Receiver (FREESTYLE LIBRE 3 READER) DEVI 1 Piece by Does not apply route once as  needed for up to 1 dose.   Continuous Glucose Sensor (FREESTYLE LIBRE 3 PLUS SENSOR) MISC Change sensor every 15 days.   fluconazole (DIFLUCAN) 150 MG tablet Take 150 mg by mouth daily. (Patient not taking: Reported on 07/07/2023)   insulin  aspart protamine - aspart (NOVOLOG  70/30 MIX) (70-30) 100 UNIT/ML FlexPen Inject 10 Units into the skin 2 (two) times daily with a meal.   JARDIANCE  10 MG TABS tablet TAKE 1 TABLET BY MOUTH EVERY DAY   Lancets Misc. (ACCU-CHEK FASTCLIX LANCET) KIT 1 each by Does not apply route 2 (two) times daily.   potassium chloride  SA (KLOR-CON  M) 20 MEQ tablet Take 1 tablet (20 mEq total) by mouth daily with lunch.   sacubitril -valsartan  (ENTRESTO ) 49-51 MG TAKE 1 TABLET BY MOUTH TWICE A DAY   spironolactone  (ALDACTONE ) 25 MG tablet TAKE 1/2 TABLET BY MOUTH EVERY DAY   No facility-administered encounter medications on file as of 08/11/2023.   ALLERGIES: No Known Allergies VACCINATION STATUS: Immunization History  Administered Date(s) Administered   Influenza-Unspecified 11/19/2017    Diabetes She is returns for follow-up in the management of her currently uncontrolled type 2 diabetes, hyperlipidemia, hypertension.   Onset time: She was diagnosed with diabetes at approximately 78 years of age.  She is accompanied by her daughter to this clinic today.  She was given the readjusted dose of NovoLog  70/30 during her last visit.  She presents with improved glycemic profile averaging 159 mg per DL and improvement from 783 during her last visit.  Prior to her last visit, her point-of-care A1c is 8.4% increasing from 6.8%.  She did not document any hypoglycemia.  She tries to follow the mealtime routine discussed as much as she can.    She remains on Jardiance  10 mg p.o. daily at breakfast.     Jardiance  was started by her cardiologist.  She has cardiac defibrillator in place.   Diabetic complications include nephropathy. Risk factors for coronary artery disease include  diabetes mellitus, dyslipidemia, hypertension, sedentary lifestyle and tobacco exposure. Her weight is fluctuating minimally, complains of poor appetite.   She  regularly eats  junk food.  She has not had a previous visit with a dietitian (She declined a referral to CDE.)  She has controlled her lipid panel currently on Lipitor 20 mg p.o. nightly.     An ACE inhibitor/angiotensin II receptor blocker is being taken. Eye exam is current.    Hyperlipidemia This is a chronic problem. The current episode started more than 1 year ago. Pertinent negatives include no chest pain, myalgias or shortness of breath. Current antihyperlipidemic treatment includes statins. Risk factors for coronary artery disease include dyslipidemia, diabetes mellitus and a sedentary lifestyle.  Her previsit labs show controlled LDL.    Hypertension This is a chronic problem. The current episode started more than 1 year ago. The problem is controlled. Pertinent negatives include no chest pain, headaches, palpitations or  shortness of breath. Past treatments include ACE inhibitors. Hypertensive end-organ damage includes kidney disease.    Review of systems: Limited as above.   Objective:    BP (!) 102/50   Pulse (!) 52   Ht 5' 6 (1.676 m)   Wt 190 lb 9.6 oz (86.5 kg)   BMI 30.76 kg/m   Wt Readings from Last 3 Encounters:  08/11/23 190 lb 9.6 oz (86.5 kg)  07/07/23 193 lb 6.4 oz (87.7 kg)  06/10/23 187 lb 12.8 oz (85.2 kg)     Physical Exam- Limited  Constitutional:  Body mass index is 30.76 kg/m. , not in acute distress, normal state of mind Eyes:  EOMI, no exophthalmos       Latest Ref Rng & Units 06/29/2023   10:57 AM 03/02/2023   10:09 AM 08/25/2022    8:49 AM  CMP  Glucose 70 - 99 mg/dL 738  89  897   BUN 8 - 27 mg/dL 11  10  16    Creatinine 0.57 - 1.00 mg/dL 9.01  9.12  9.00   Sodium 134 - 144 mmol/L 143  146  144   Potassium 3.5 - 5.2 mmol/L 3.7  3.4  4.3   Chloride 96 - 106 mmol/L 105  106   108   CO2 20 - 29 mmol/L 24  26  21    Calcium  8.7 - 10.3 mg/dL 9.3  9.4  9.4   Total Protein 6.0 - 8.5 g/dL 6.1  6.5  6.5   Total Bilirubin 0.0 - 1.2 mg/dL 0.3  0.4  0.4   Alkaline Phos 44 - 121 IU/L 78  77  76   AST 0 - 40 IU/L 11  16  13    ALT 0 - 32 IU/L 9  10  9      Lab Results  Component Value Date   HGBA1C 8.4 07/07/2023   HGBA1C 6.8 03/08/2023   HGBA1C 6.0 09/04/2022   MICROALBUR 0.7 08/28/2019   MICROALBUR 0.3 04/14/2016   Lipid Panel     Component Value Date/Time   CHOL 122 08/25/2022 0849   TRIG 58 08/25/2022 0849   HDL 54 08/25/2022 0849   CHOLHDL 2.3 08/25/2022 0849   CHOLHDL 3.5 08/28/2019 0851   VLDL 14 09/26/2015 0856   LDLCALC 55 08/25/2022 0849   LDLCALC 141 (H) 08/28/2019 0851     Assessment & Plan:   1. Type 2 diabetes mellitus with long-term current use of insulin  (HCC)  -Her diabetes is complicated by cardiomyopathy and she remains at a high risk for more acute and chronic complications of diabetes which include CAD, CVA, CKD, retinopathy, and neuropathy. These are all discussed in detail with the patient.  Mrs. Diana Campbell presents with better blood glucose profile averaging 159 mg per DL with no hypoglycemia.     - I have re-counseled the patient on diet management and weight loss  by adopting a carbohydrate restricted / protein rich  Diet.  She did not engage with lifestyle medicine optimally.  - Suggestion is made for her to avoid simple carbohydrates  from her diet including Cakes, Sweet Desserts, Ice Cream, Soda (diet and regular), Sweet Tea, Candies, Chips, Cookies, Store Bought Juices, Alcohol , Artificial Sweeteners,  Coffee Creamer, and Sugar-free Products, Lemonade. This will help patient to have more stable blood glucose profile and potentially avoid unintended weight gain.  - Patient is advised to stick to a routine mealtimes to eat 3 meals a day and avoid unnecessary snacks specially with  ultra processed foods.    - She declined  referral to a  CDE for individualized DM education.  - I have approached patient with the following individualized plan to manage diabetes and patient agrees.   - Patient  worries a lot about hypoglycemia.  Her daughter recently moved in with her and is offering to help. - All efforts will be to avoid hypoglycemia in this patient.   - I had another discussion about the CGM with her, this time she says she will go check with the pharmacist.   - This will be very helpful to manage her glycemic excursions.   - She is advised to continue NovoLog  70/30 10 units with breakfast and 10 units with supper for Premeal blood glucose readings above 90 mg per DL.    She is advised to continue Jardiance  10 mg p.o. daily at breakfast.    - If her insurance does not pay for a CGM, she is advised and agrees to continue monitoring of blood glucose 4 times a day-before meals and at bedtime.    -Patient is encouraged to call clinic for blood glucose levels less than 70 or above 200 mg /dl.  -Her most recent labs show normal renal function.  She would have benefited from low-dose metformin, however she wishes to avoid it at this time.    - Patient specific target  for A1c; LDL, HDL, Triglycerides,were discussed in detail.  2) BP/HTN:  - Her blood pressure is controlled to target, advised to continue  carvedilol  12.5 mg p.o. twice daily, spironolactone  25 mg p.o. once a day.   3) Lipids/HPL: Recent lipid panel showed improved lipid panel with LDL at 55 improving from 127.    She is advised to continue Lipitor 20 mg p.o. nightly.       4)  Weight/Diet: Her BMI 30.76-   This is complicating her diabetes care.  She is a candidate for modest weight loss.    She declined CDE consult . exercise, and carbohydrates information provided.  5) Chronic Care/Health Maintenance:  -Patient is on ACEI/ARB and Statin medications and encouraged to continue to follow up with Ophthalmology, Podiatrist at least yearly or  according to recommendations, and advised to  stay away from smoking. I have recommended yearly flu vaccine and pneumonia vaccination at least every 5 years; moderate intensity exercise for up to 150 minutes weekly; and  sleep for at least 7 hours a day.  -She was not ready for foot exam today.  She will be approached again.   - I advised patient to maintain close follow up with Leonce Lucie PARAS, PA-C for primary care needs.  I spent  25  minutes in the care of the patient today including review of labs from CMP, Lipids, Thyroid  Function, Hematology (current and previous including abstractions from other facilities); face-to-face time discussing  her blood glucose readings/logs, discussing hypoglycemia and hyperglycemia episodes and symptoms, medications doses, her options of short and long term treatment based on the latest standards of care / guidelines;  discussion about incorporating lifestyle medicine;  and documenting the encounter. Risk reduction counseling performed per USPSTF guidelines to reduce obesity and cardiovascular risk factors.     Please refer to Patient Instructions for Blood Glucose Monitoring and Insulin /Medications Dosing Guide  in media tab for additional information. Please  also refer to  Patient Self Inventory in the Media  tab for reviewed elements of pertinent patient history.  Daisja LELON Campbell participated in the discussions, expressed understanding,  and voiced agreement with the above plans.  All questions were answered to her satisfaction. she is encouraged to contact clinic should she have any questions or concerns prior to her return visit.     Follow up plan: -Return in about 4 months (around 12/12/2023) for F/U with Pre-visit Labs, Meter/CGM/Logs, A1c here.  Ranny Earl, MD Phone: [phone]  Fax: [phone]  -  This note was partially dictated with voice recognition software. Similar sounding words can be transcribed inadequately or may not  be  corrected upon review.  08/11/2023, 9:45 AM

## 2023-08-14 ENCOUNTER — Other Ambulatory Visit: Payer: Self-pay | Admitting: "Endocrinology

## 2023-08-14 ENCOUNTER — Other Ambulatory Visit: Payer: Self-pay | Admitting: Cardiovascular Disease

## 2023-08-14 DIAGNOSIS — E119 Type 2 diabetes mellitus without complications: Secondary | ICD-10-CM

## 2023-08-16 ENCOUNTER — Ambulatory Visit: Attending: Cardiovascular Disease

## 2023-08-16 DIAGNOSIS — Z95810 Presence of automatic (implantable) cardiac defibrillator: Secondary | ICD-10-CM

## 2023-08-16 DIAGNOSIS — I5042 Chronic combined systolic (congestive) and diastolic (congestive) heart failure: Secondary | ICD-10-CM

## 2023-08-20 NOTE — Progress Notes (Signed)
 EPIC Encounter for ICM Monitoring  Patient Name: Diana Campbell is a 78 y.o. female Date: 08/20/2023 Primary Care Physican: Leonce Lucie JINNY DEVONNA Primary Cardiologist: Burnard Electrophysiologist: Croitoru 12/23/2022 Office Weight: 202 lbs 03/08/2023 Office Weight: 200 lbs 05/25/2023 Weight: 200 lbs at home 06/10/2023 Office Weight: 187 lbs   Clinical Status (10-Jun-2023 to 28-Jun-2023) Time in AT/AF               0.0 hr/day (0.0%)                                                        Transmission results reviewed.     Optivol thoracic impedance suggesting normal fluid levels within the last month.   Prescribed: No diuretic (Previously on Furosemide  40 mg 1 tablet as needed) Potassium 20 mEq take 1 tablet by mouth at lunch daily Spironolactone  25 mg take 0.5 tablet (12.5 mg total) by mouth every other day.   Labs: 06/29/2023 Creatinine 0.98, BUN 11, Potassium 3.7, Sodium 143, GFR 59 03/02/2023 Creatinine 0.87, BUN 10, Potassium 3.4, Sodium 146, GFR 68 A complete set of results can be found in Results Review.   Recommendations:  No changes   Follow-up plan: ICM clinic phone appointment on 09/21/2023.   91 day device clinic remote transmission 10/27/2023.     EP/Cardiology Office Visits:  Recall needed with Dr Francyne or EP APP for 05/2024.     Recall 08/11/2023 with Dr Darron (yearly).   Copy of ICM check sent to Dr. Francyne.    3 month ICM trend: 08/16/2023.    12-14 Month ICM trend:     Diana GORMAN Garner, RN 08/20/2023 8:32 AM

## 2023-09-06 ENCOUNTER — Encounter (HOSPITAL_COMMUNITY): Payer: Self-pay | Admitting: Emergency Medicine

## 2023-09-06 ENCOUNTER — Ambulatory Visit (HOSPITAL_COMMUNITY): Admission: EM | Admit: 2023-09-06 | Discharge: 2023-09-06 | Disposition: A | Discharge status: Home / Self Care

## 2023-09-06 ENCOUNTER — Other Ambulatory Visit: Payer: Self-pay

## 2023-09-06 DIAGNOSIS — K219 Gastro-esophageal reflux disease without esophagitis: Secondary | ICD-10-CM | POA: Diagnosis not present

## 2023-09-06 MED ORDER — PANTOPRAZOLE SODIUM 20 MG PO TBEC: 20.0000 mg | DELAYED_RELEASE_TABLET | Freq: Every day | ORAL | 0 refills | Status: DC

## 2023-09-06 NOTE — Discharge Instructions (Signed)
 You were seen in the ER today for concerns of a sore throat. I suspect your symptoms are likely due to poorly controlled acid reflux. I am starting you on a PPI medication called Protonix . Please take this as prescribed. Go to the ER if you develop swallowing. I think you would benefit from evaluation with gastroenterology for your symptoms if they are not improving.

## 2023-09-06 NOTE — ED Provider Notes (Signed)
 [facility]    CSN: [id number] Arrival date & time: 09/06/23  1020      History   Chief Complaint Chief Complaint  Patient presents with   Sore Throat    HPI Diana Campbell is a 78 y.o. female.  Patient with history of cardiomyopathy, type 2 diabetes, atrial fibrillation, obesity presents to urgent care with concerns of a sore throat.  Reports she has had sore throat ongoing for the last 2 or 3 months.  Denies any significant difficulty swallowing with eating or drinking.  Does endorse a history of GERD not currently controlled with any medications.   Sore Throat    Past Medical History:  Diagnosis Date   Arthritis    Automatic implantable cardioverter-defibrillator in situ 07/2010   Cardiac abnormality    with defibrilator   Chronic combined systolic and diastolic CHF (congestive heart failure) (HCC) 09/04/2015   Diabetes mellitus    H/O cardiac arrest 2006   Hypercholesteremia    Hypertension    Neuropathy     Patient Active Problem List   Diagnosis Date Noted   Hypokalemia 03/08/2023   Insulin  long-term use (HCC) 09/04/2022   Class 1 obesity due to excess calories with serious comorbidity and body mass index (BMI) of 32.0 to 32.9 in adult 01/01/2022   Type 2 diabetes mellitus without complication, with long-term current use of insulin  (HCC) 05/21/2021   VF (ventricular fibrillation) (HCC) 08/22/2018   PAF (paroxysmal atrial fibrillation) (HCC) 08/22/2018   UTI (urinary tract infection) 01/30/2018   Acute pyelonephritis 01/28/2018   Sepsis due to urinary tract infection (HCC) 01/28/2018   ICD (implantable cardioverter-defibrillator) in place    Carpal tunnel syndrome of right wrist    Chronic combined systolic and diastolic CHF (congestive heart failure) (HCC) 09/04/2015   Mixed hyperlipidemia 12/24/2014   Essential hypertension, benign 12/24/2014   Everitt Curt disease (radial styloid tenosynovitis) 08/11/2013   Witnessed recurrent apnea,  suspected obstructive sleep apnea 03/22/2013   Cardiomyopathy, nonischemic (HCC) 12/29/2012   Uncontrolled type 2 diabetes mellitus with complication, with long-term current use of insulin  12/29/2012   Automatic implantable cardioverter-defibrillator in situ 12/29/2012   Ankle fracture 06/21/2012   Lateral malleolar fracture 04/19/2012    Past Surgical History:  Procedure Laterality Date   CARDIAC CATHETERIZATION  06/22/2003   Severe nonischemic cardiomyopathy, 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   CARDIAC CATHETERIZATION  04/14/2000   Normal coronary arteries.   CARDIAC DEFIBRILLATOR PLACEMENT  07/21/2010   Medtronic Protecta XT DR, model#[id number], serial#[id number]   CARDIOVASCULAR STRESS TEST  04/05/2000   Scintigraphic evidence of mild LV dilatation and global LV dysfunction with an EF 41% with mild inferolateral ischemia.   CARPAL TUNNEL RELEASE Right 01/23/2016   Procedure: CARPAL TUNNEL RELEASE;  Surgeon: Taft FORBES Minerva, MD;  Location: [facility];  Service: Orthopedics;  Laterality: Right;  pt knows to arrive at 6:15   COLONOSCOPY  03/30/2011   Procedure: COLONOSCOPY;  Surgeon: Oneil DELENA Budge, MD;  Location: [facility];  Service: Gastroenterology;  Laterality: N/A;   DORSAL COMPARTMENT RELEASE Right 08/11/2013   Procedure: RELEASE DORSAL COMPARTMENT (DEQUERVAIN);  Surgeon: Taft FORBES Minerva, MD;  Location: [facility];  Service: Orthopedics;  Laterality: Right;   ICD GENERATOR CHANGEOUT N/A 10/29/2017   Procedure: ICD GENERATOR CHANGEOUT;  Surgeon: Francyne Headland, MD;  Location: [facility];  Service: Cardiovascular;  Laterality: N/A;   TRANSTHORACIC ECHOCARDIOGRAM  08/03/2011  EF 45-50%, mildly reduced LV systolic function.    OB History   No obstetric history on file.      Home Medications    Prior to Admission medications   Medication Sig Start Date End Date Taking? Authorizing  Provider  aprepitant (EMEND) 125 MG capsule Take by mouth daily. 04/10/23  Yes [provider]  pantoprazole  (PROTONIX ) 20 MG tablet Take 1 tablet (20 mg total) by mouth daily. 09/06/23  Yes Allyah Heather A, PA-C  ACCU-CHEK FASTCLIX LANCETS MISC 1 each by Does not apply route 2 (two) times daily. 09/07/17   Nida, Gebreselassie W, MD  ACCU-CHEK GUIDE test strip USE AS INSTRUCTED TO TEST BLOOD GLUCOSE FOUR TIMES DAILY 07/22/22   Nida, Gebreselassie W, MD  acetaminophen  (TYLENOL ) 325 MG tablet Take 650 mg by mouth in the morning and at bedtime.    [provider]  amoxicillin -clavulanate (AUGMENTIN) 875-125 MG tablet Take by mouth 2 (two) times daily. 03/20/22   [provider]  aspirin  EC 81 MG tablet Take 1 tablet (81 mg total) by mouth daily. 08/22/18   Maxie Herlene POUR, PA-C  atorvastatin  (LIPITOR) 20 MG tablet Take 1 tablet (20 mg total) by mouth daily. 04/02/23   Burnard Debby LABOR, MD  benzonatate (TESSALON) 200 MG capsule Take 200 mg by mouth 3 (three) times daily. Patient not taking: Reported on 07/07/2023 03/20/22   [provider]  Blood Glucose Monitoring Suppl (ACCU-CHEK GUIDE) w/Device KIT Use as directed to check blood glucose four times daily 10/30/19   Nida, Gebreselassie W, MD  carvedilol  (COREG ) 3.125 MG tablet Take 1 tablet (3.125 mg total) by mouth 2 (two) times daily. 06/10/23 09/08/23  Croitoru, Mihai, MD  Continuous Glucose Receiver (FREESTYLE LIBRE 3 READER) DEVI 1 Piece by Does not apply route once as needed for up to 1 dose. 07/07/23   Nida, Gebreselassie W, MD  Continuous Glucose Sensor (FREESTYLE LIBRE 3 PLUS SENSOR) MISC Change sensor every 15 days. 07/07/23   Nida, Gebreselassie W, MD  donepezil (ARICEPT) 5 MG tablet Take 5 mg by mouth daily. 08/03/23   [provider]  fluconazole (DIFLUCAN) 150 MG tablet Take 150 mg by mouth daily. Patient not taking: Reported on 07/07/2023 03/20/22   [provider]  Insulin  Asp Prot & Asp FlexPen (NOVOLOG   70/30 MIX) (70-30) 100 UNIT/ML FlexPen Inject 10 units with breakfast and 10 units with supper as directed by provider. 08/16/23   Nida, Gebreselassie W, MD  JARDIANCE  10 MG TABS tablet TAKE 1 TABLET BY MOUTH EVERY DAY 07/26/23   Burnard Debby LABOR, MD  Lancets Misc. (ACCU-CHEK FASTCLIX LANCET) KIT 1 each by Does not apply route 2 (two) times daily. 09/07/17   Nida, Gebreselassie W, MD  potassium chloride  SA (KLOR-CON  M) 20 MEQ tablet Take 1 tablet (20 mEq total) by mouth daily with lunch. 03/08/23   Nida, Gebreselassie W, MD  sacubitril -valsartan  (ENTRESTO ) 49-51 MG TAKE 1 TABLET BY MOUTH TWICE A DAY 01/14/23   Lambert, Jennifer K, PA-C  spironolactone  (ALDACTONE ) 25 MG tablet TAKE 1/2 TABLET BY MOUTH EVERY DAY 04/28/22   Burnard Debby LABOR, MD    Family History History reviewed. No pertinent family history.  Social History Social History   Tobacco Use   Smoking status: Never   Smokeless tobacco: Current    Types: Snuff   Tobacco comments:    3 dips per day  Vaping Use   Vaping status: Never Used  Substance Use Topics   Alcohol use: No  Drug use: No     Allergies   Patient has no known allergies.   Review of Systems Review of Systems  HENT:  Positive for sore throat.   All other systems reviewed and are negative.    Physical Exam Triage Vital Signs ED Triage Vitals  Encounter Vitals Group     BP 09/06/23 1107 (!) 102/56     Girls Systolic BP Percentile --      Girls Diastolic BP Percentile --      Boys Systolic BP Percentile --      Boys Diastolic BP Percentile --      Pulse Rate 09/06/23 1107 61     Resp 09/06/23 1107 18     Temp 09/06/23 1107 98.2 F (36.8 C)     Temp Source 09/06/23 1107 Oral     SpO2 09/06/23 1107 94 %     Weight --      Height --      Head Circumference --      Peak Flow --      Pain Score 09/06/23 1103 10     Pain Loc --      Pain Education --      Exclude from Growth Chart --    No data found.  Updated Vital Signs BP (!) 102/56 (BP  Location: Left Arm) Comment (BP Location): large cuff  Pulse 61   Temp 98.2 F (36.8 C) (Oral)   Resp 18   SpO2 94%   Visual Acuity Right Eye Distance:   Left Eye Distance:   Bilateral Distance:    Right Eye Near:   Left Eye Near:    Bilateral Near:     Physical Exam Vitals and nursing note reviewed.  Constitutional:      General: She is not in acute distress.    Appearance: She is well-developed.  HENT:     Head: Normocephalic and atraumatic.     Mouth/Throat:     Mouth: Mucous membranes are moist.     Pharynx: Oropharynx is clear. No oropharyngeal exudate.     Tonsils: No tonsillar exudate or tonsillar abscesses. 0 on the right. 0 on the left.  Eyes:     Conjunctiva/sclera: Conjunctivae normal.  Cardiovascular:     Rate and Rhythm: Normal rate and regular rhythm.     Heart sounds: No murmur heard. Pulmonary:     Effort: Pulmonary effort is normal. No respiratory distress.     Breath sounds: Normal breath sounds.  Abdominal:     Palpations: Abdomen is soft.     Tenderness: There is no abdominal tenderness.  Musculoskeletal:        General: No swelling.     Cervical back: Neck supple.  Skin:    General: Skin is warm and dry.     Capillary Refill: Capillary refill takes less than 2 seconds.  Neurological:     Mental Status: She is alert.  Psychiatric:        Mood and Affect: Mood normal.      UC Treatments / Results  Labs (all labs ordered are listed, but only abnormal results are displayed) Labs Reviewed - No data to display  EKG   Radiology No results found.  Procedures Procedures (including critical care time)  Medications Ordered in UC Medications - No data to display  Initial Impression / Assessment and Plan / UC Course  I have reviewed the triage vital signs and the nursing notes.  Pertinent labs & imaging results that  were available during my care of the patient were reviewed by me and considered in my medical decision making (see chart  for details).    This patient presents to the UC for concern of sore throat. Differential diagnosis includes pharyngitis, GERD, esophagitis, dysphagia   Problem List / UC Course:  Patient with past history significant for cardiomyopathy, ventricular fibrillation, atrial fibrillation, type 2 diabetes here with concerns of a sore throat.  Reports that this is been ongoing for the last 2 to 3 minutes.  Endorses that she has previously given some sort of rinse to gargle with that did not improve her symptoms.  Denies any difficulty swallowing.  No reported fever, chills or bodyaches. On exam, patient has no oropharyngeal erythema or tonsillar exudate.  Uvula is midline. Based on patient's history, appears that she may have poorly controlled reflux. Unclear if this could be esophagitis related. She is not on PPI therapy at this time and does endorse reflux related symptoms. Will start on a course of Protonix  and advised close follow up with GI if symptoms are not improving. Return precautions advised. Strict ED precautions discussed. Discharged home in stable condition.   Social Determinants of Health:  None  Final Clinical Impressions(s) / UC Diagnoses   Final diagnoses:  Gastroesophageal reflux disease, unspecified whether esophagitis present     Discharge Instructions      You were seen in the ER today for concerns of a sore throat. I suspect your symptoms are likely due to poorly controlled acid reflux. I am starting you on a PPI medication called Protonix . Please take this as prescribed. Go to the ER if you develop swallowing. I think you would benefit from evaluation with gastroenterology for your symptoms if they are not improving.     ED Prescriptions     Medication Sig Dispense Auth. Provider   pantoprazole  (PROTONIX ) 20 MG tablet Take 1 tablet (20 mg total) by mouth daily. 30 tablet Ayaan Ringle A, PA-C      PDMP not reviewed this encounter.   Hulon Ferron A,  PA-C 09/06/23 [phone]

## 2023-09-06 NOTE — ED Triage Notes (Signed)
 Sore throat for 2-3 months.  Reports seeing a doctor that gave her something to gargle with and did not help.    Reports an intermittent runny nose.    Takes aleve to help pain.  Patient denies a fever

## 2023-09-17 ENCOUNTER — Other Ambulatory Visit: Payer: Self-pay | Admitting: Cardiovascular Disease

## 2023-09-17 ENCOUNTER — Other Ambulatory Visit: Payer: Self-pay

## 2023-09-17 MED ORDER — ATORVASTATIN CALCIUM 20 MG PO TABS: 20.0000 mg | ORAL_TABLET | Freq: Every day | ORAL | 2 refills | Status: DC

## 2023-09-21 ENCOUNTER — Ambulatory Visit: Attending: Cardiovascular Disease

## 2023-09-21 DIAGNOSIS — Z95810 Presence of automatic (implantable) cardiac defibrillator: Secondary | ICD-10-CM

## 2023-09-21 DIAGNOSIS — I5042 Chronic combined systolic (congestive) and diastolic (congestive) heart failure: Secondary | ICD-10-CM

## 2023-09-23 NOTE — Progress Notes (Signed)
 EPIC Encounter for ICM Monitoring  Patient Name: Diana Campbell is a 78 y.o. female Date: 09/23/2023 Primary Care Physican: Diana Campbell Primary Cardiologist: Diana Campbell Electrophysiologist: Diana Campbell 12/23/2022 Office Weight: 202 lbs 03/08/2023 Office Weight: 200 lbs 05/25/2023 Weight: 200 lbs at home 06/10/2023 Office Weight: 187 lbs 08/11/2023 Office Weight: 190 lbs   Clinical Status Since 16-Aug-2023 Time in AT/AF      <0.1 hr/day (<0.1%                                                     Spoke with patient and heart failure questions reviewed.  Transmission results reviewed.  Pt asymptomatic for fluid accumulation.  Reports feeling well at this time and voices no complaints.  Husband passed away in the last month.   Optivol thoracic impedance suggesting normal fluid levels within the last month with exception of possible fluid accumulation 8/28 but trending back to baseline.   Prescribed:  No diuretic (Previously on Furosemide  40 mg 1 tablet as needed) Potassium 20 mEq take 1 tablet by mouth at lunch daily Spironolactone  25 mg take 0.5 tablet (12.5 mg total) by mouth every other day.   Labs: 06/29/2023 Creatinine 0.98, BUN 11, Potassium 3.7, Sodium 143, GFR 59 03/02/2023 Creatinine 0.87, BUN 10, Potassium 3.4, Sodium 146, GFR 68 A complete set of results can be found in Results Review.   Recommendations:  No changes and encouraged to call if experiencing any fluid symptoms.   Follow-up plan: ICM clinic phone appointment on 11/01/2023.   91 day device clinic remote transmission 10/27/2023.     EP/Cardiology Office Visits:  Recall needed with Dr Diana Campbell or EP APP for 05/2024.     Recall 08/11/2023 with Dr Diana Campbell (yearly).   Copy of ICM check sent to Dr. Francyne.    3 month ICM trend: 09/21/2023.    12-14 Month ICM trend:     Diana GORMAN Garner, RN 09/23/2023 7:58 AM

## 2023-09-27 ENCOUNTER — Encounter: Payer: Self-pay | Admitting: Cardiovascular Disease

## 2023-09-27 NOTE — Telephone Encounter (Signed)
 Error

## 2023-10-20 ENCOUNTER — Ambulatory Visit: Payer: Self-pay

## 2023-10-20 ENCOUNTER — Other Ambulatory Visit: Payer: Self-pay

## 2023-10-20 DIAGNOSIS — K219 Gastro-esophageal reflux disease without esophagitis: Secondary | ICD-10-CM

## 2023-10-20 MED ORDER — PANTOPRAZOLE SODIUM 20 MG PO TBEC: 20.0000 mg | DELAYED_RELEASE_TABLET | Freq: Every day | ORAL | 1 refills | Status: DC

## 2023-10-20 NOTE — Telephone Encounter (Signed)
 FYI Only or Action Required?: Action required by provider: request for appointment.  Patient was last seen in primary care on N/A, New Patient.  Called Nurse Triage reporting No chief complaint on file..  Symptoms began several days ago.  Interventions attempted: Prescription medications: Protonix .  Symptoms are: unchanged.  Triage Disposition: Home Care  Patient/caregiver understands and will follow disposition?: Yes  Copied from CRM [id number]. Topic: Clinical - Red Word Triage >> Oct 20, 2023  9:03 AM Diana Campbell wrote: Red Word that prompted transfer to Nurse Triage: The patient called stating she feels like something is caught in her throat and has felt like that for a while. She was taking pantoprazole  (PROTONIX ) 20 MG tablet. I will transfer her to E2C2 NT Reason for Disposition  [1] Sore throat is the only symptom AND [2] sore throat present < 48 hours    Patient was recently seen in the Urgent Care and treated for GERD.  Answer Assessment - Initial Assessment Questions 1. ONSET: When did the throat start hurting? (Hours or days ago)      Several days  2. SEVERITY: How bad is the sore throat? (Scale 1-10; mild, moderate or severe)     Mild  3. STREP EXPOSURE: Has there been any exposure to strep within the past week? If Yes, ask: What type of contact occurred?      No  4.  VIRAL SYMPTOMS: Are there any symptoms of a cold, such as a runny nose, cough, hoarse voice or red eyes?      No  5. FEVER: Do you have a fever? If Yes, ask: What is your temperature, how was it measured, and when did it start?     No  6. PUS ON THE TONSILS: Is there pus on the tonsils in the back of your throat?     No  7. OTHER SYMPTOMS: Do you have any other symptoms? (e.g., difficulty breathing, headache, rash)     Feeling of having something caught in the throat  8. PREGNANCY: Is there any chance you are pregnant? When was your last menstrual period?     No and  No  Protocols used: Sore Throat-A-AH

## 2023-10-22 ENCOUNTER — Ambulatory Visit (INDEPENDENT_AMBULATORY_CARE_PROVIDER_SITE_OTHER)

## 2023-10-22 ENCOUNTER — Ambulatory Visit (HOSPITAL_COMMUNITY)

## 2023-10-22 ENCOUNTER — Encounter (HOSPITAL_COMMUNITY): Payer: Self-pay

## 2023-10-22 ENCOUNTER — Ambulatory Visit (HOSPITAL_COMMUNITY)
Admission: EM | Admit: 2023-10-22 | Discharge: 2023-10-22 | Disposition: A | Discharge status: Home / Self Care | Attending: Physician Assistant | Admitting: Physician Assistant

## 2023-10-22 DIAGNOSIS — G5602 Carpal tunnel syndrome, left upper limb: Secondary | ICD-10-CM | POA: Diagnosis not present

## 2023-10-22 DIAGNOSIS — R09A2 Foreign body sensation, throat: Secondary | ICD-10-CM | POA: Diagnosis not present

## 2023-10-22 MED ORDER — PANTOPRAZOLE SODIUM 20 MG PO TBEC: 20.0000 mg | DELAYED_RELEASE_TABLET | Freq: Every day | ORAL | 0 refills | Status: DC

## 2023-10-22 NOTE — ED Triage Notes (Signed)
 Patient reports something is hanging in her throat for 2-3 months. Patient states she was prescribed pantoprazole  but this has not help.

## 2023-10-22 NOTE — ED Provider Notes (Signed)
 [facility]    CSN: [id number] Arrival date & time: 10/22/23  1013      History   Chief Complaint Chief Complaint  Patient presents with   Sore Throat    HPI Diana Campbell is a 78 y.o. female.   Patient presents today companied by her family member.  Reports a 2 to 19-month history of a globus sensation worse with swallowing.  She does report associated dysphagia and intermittent choking episodes.  This occurs equally with liquids, solid foods, pills.  She is able to eat and drink despite the symptoms.  She does report some discomfort with swallowing but denies any significant pain.  She has been seen before and prescribed pantoprazole  which provided improvement but no resolution of symptoms.  She has not seen a GI specialist or ENT.  Denies history of thyroid  condition including nodules.  She has not seen a speech therapist.  She denies any choking episodes before symptoms began or concern for large foreign body.  In addition, patient reports a several month history of intermittent left hand numbness.  She reports that symptoms are worse whenever she feels cold and tend to improve with certain activities including massaging the hand and warming it up.  She reports that the symptoms are worse in her thumb and first few fingers.  She is not experiencing any significant pain at this time.  She does feel sharp as she often picks her fingers for blood sugar monitoring and feels this.  She has a carpal tunnel and had surgery on the right hand but has never had surgery involving the left hand.  She has not been taking any medication for symptom management.  She is not currently using a brace.  She denies any history of Raynaud's or associated discoloration of the hand.  She denies any recent trauma or change in activity.    Past Medical History:  Diagnosis Date   Arthritis    Automatic implantable cardioverter-defibrillator in situ 07/2010   Cardiac abnormality    with  defibrilator   Chronic combined systolic and diastolic CHF (congestive heart failure) (HCC) 09/04/2015   Diabetes mellitus    H/O cardiac arrest 2006   Hypercholesteremia    Hypertension    Neuropathy     Patient Active Problem List   Diagnosis Date Noted   Hypokalemia 03/08/2023   Insulin  long-term use (HCC) 09/04/2022   Class 1 obesity due to excess calories with serious comorbidity and body mass index (BMI) of 32.0 to 32.9 in adult 01/01/2022   Type 2 diabetes mellitus without complication, with long-term current use of insulin  (HCC) 05/21/2021   VF (ventricular fibrillation) (HCC) 08/22/2018   PAF (paroxysmal atrial fibrillation) (HCC) 08/22/2018   UTI (urinary tract infection) 01/30/2018   Acute pyelonephritis 01/28/2018   Sepsis due to urinary tract infection (HCC) 01/28/2018   ICD (implantable cardioverter-defibrillator) in place    Carpal tunnel syndrome of right wrist    Chronic combined systolic and diastolic CHF (congestive heart failure) (HCC) 09/04/2015   Mixed hyperlipidemia 12/24/2014   Essential hypertension, benign 12/24/2014   Everitt Quervain's disease (radial styloid tenosynovitis) 08/11/2013   Witnessed recurrent apnea, suspected obstructive sleep apnea 03/22/2013   Cardiomyopathy, nonischemic (HCC) 12/29/2012   Uncontrolled type 2 diabetes mellitus with complication, with long-term current use of insulin  12/29/2012   Automatic implantable cardioverter-defibrillator in situ 12/29/2012   Ankle fracture 06/21/2012   Lateral malleolar fracture 04/19/2012    Past Surgical History:  Procedure Laterality Date  CARDIAC CATHETERIZATION  06/22/2003   Severe nonischemic cardiomyopathy, 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   CARDIAC CATHETERIZATION  04/14/2000   Normal coronary arteries.   CARDIAC DEFIBRILLATOR PLACEMENT  07/21/2010   Medtronic Protecta XT DR, model#[id number],  serial#[id number]   CARDIOVASCULAR STRESS TEST  04/05/2000   Scintigraphic evidence of mild LV dilatation and global LV dysfunction with an EF 41% with mild inferolateral ischemia.   CARPAL TUNNEL RELEASE Right 01/23/2016   Procedure: CARPAL TUNNEL RELEASE;  Surgeon: Taft FORBES Minerva, MD;  Location: [facility];  Service: Orthopedics;  Laterality: Right;  pt knows to arrive at 6:15   COLONOSCOPY  03/30/2011   Procedure: COLONOSCOPY;  Surgeon: Oneil DELENA Budge, MD;  Location: [facility];  Service: Gastroenterology;  Laterality: N/A;   DORSAL COMPARTMENT RELEASE Right 08/11/2013   Procedure: RELEASE DORSAL COMPARTMENT (DEQUERVAIN);  Surgeon: Taft FORBES Minerva, MD;  Location: [facility];  Service: Orthopedics;  Laterality: Right;   ICD GENERATOR CHANGEOUT N/A 10/29/2017   Procedure: ICD GENERATOR CHANGEOUT;  Surgeon: Francyne Headland, MD;  Location: [facility];  Service: Cardiovascular;  Laterality: N/A;   TRANSTHORACIC ECHOCARDIOGRAM  08/03/2011   EF 45-50%, mildly reduced LV systolic function.    OB History   No obstetric history on file.      Home Medications    Prior to Admission medications   Medication Sig Start Date End Date Taking? Authorizing Provider  ACCU-CHEK FASTCLIX LANCETS MISC 1 each by Does not apply route 2 (two) times daily. 09/07/17  Yes Nida, Gebreselassie W, MD  ACCU-CHEK GUIDE test strip USE AS INSTRUCTED TO TEST BLOOD GLUCOSE FOUR TIMES DAILY 07/22/22  Yes Nida, Gebreselassie W, MD  acetaminophen  (TYLENOL ) 325 MG tablet Take 650 mg by mouth in the morning and at bedtime.   Yes [provider]  aprepitant (EMEND) 125 MG capsule Take by mouth daily. 04/10/23  Yes [provider]  aspirin  EC 81 MG tablet Take 1 tablet (81 mg total) by mouth daily. 08/22/18  Yes Kilroy, Herlene POUR, PA-C  atorvastatin  (LIPITOR) 20 MG tablet Take 1 tablet (20 mg total) by mouth daily. 09/17/23  Yes Croitoru, Mihai, MD  Blood Glucose Monitoring Suppl (ACCU-CHEK GUIDE) w/Device KIT Use  as directed to check blood glucose four times daily 10/30/19  Yes Nida, Ethelle ORN, MD  Continuous Glucose Receiver (FREESTYLE LIBRE 3 READER) DEVI 1 Piece by Does not apply route once as needed for up to 1 dose. 07/07/23  Yes Nida, Gebreselassie W, MD  Continuous Glucose Sensor (FREESTYLE LIBRE 3 PLUS SENSOR) MISC Change sensor every 15 days. 07/07/23  Yes Nida, Gebreselassie W, MD  donepezil (ARICEPT) 5 MG tablet Take 5 mg by mouth daily. 08/03/23  Yes [provider]  Insulin  Asp Prot & Asp FlexPen (NOVOLOG  70/30 MIX) (70-30) 100 UNIT/ML FlexPen Inject 10 units with breakfast and 10 units with supper as directed by provider. 08/16/23  Yes Nida, Ethelle ORN, MD  JARDIANCE  10 MG TABS tablet TAKE 1 TABLET BY MOUTH EVERY DAY 07/26/23  Yes Burnard Debby DELENA, MD  Lancets Misc. (ACCU-CHEK FASTCLIX LANCET) KIT 1 each by Does not apply route 2 (two) times daily. 09/07/17  Yes Nida, Gebreselassie W, MD  pantoprazole  (PROTONIX ) 20 MG tablet Take 1 tablet (20 mg total) by mouth daily. 10/22/23  Yes Regie Bunner K, PA-C  potassium chloride  SA (KLOR-CON  M) 20 MEQ tablet Take 1 tablet (20 mEq total) by mouth daily with  lunch. 03/08/23  Yes Nida, Gebreselassie W, MD  sacubitril -valsartan  (ENTRESTO ) 49-51 MG TAKE 1 TABLET BY MOUTH TWICE A DAY 01/14/23  Yes Cindie Nest K, PA-C  spironolactone  (ALDACTONE ) 25 MG tablet TAKE 1/2 TABLET BY MOUTH EVERY DAY 04/28/22  Yes Burnard Debby LABOR, MD  carvedilol  (COREG ) 3.125 MG tablet Take 1 tablet (3.125 mg total) by mouth 2 (two) times daily. 06/10/23 09/08/23  Croitoru, Jerel, MD    Family History No family history on file.  Social History Social History   Tobacco Use   Smoking status: Never   Smokeless tobacco: Current    Types: Snuff   Tobacco comments:    3 dips per day  Vaping Use   Vaping status: Never Used  Substance Use Topics   Alcohol use: No   Drug use: No     Allergies   Patient has no known allergies.   Review of Systems Review of  Systems  Constitutional:  Negative for activity change, appetite change, fatigue and fever.  HENT:  Positive for trouble swallowing. Negative for congestion, sore throat and voice change.   Respiratory:  Negative for shortness of breath.   Gastrointestinal:  Negative for nausea and vomiting.  Musculoskeletal:  Positive for myalgias (Left hand). Negative for arthralgias.  Neurological:  Positive for numbness (Left fingers). Negative for weakness.     Physical Exam Triage Vital Signs ED Triage Vitals  Encounter Vitals Group     BP 10/22/23 1043 102/65     Girls Systolic BP Percentile --      Girls Diastolic BP Percentile --      Boys Systolic BP Percentile --      Boys Diastolic BP Percentile --      Pulse Rate 10/22/23 1043 70     Resp 10/22/23 1043 18     Temp 10/22/23 1043 98.1 F (36.7 C)     Temp Source 10/22/23 1043 Oral     SpO2 10/22/23 1043 95 %     Weight --      Height --      Head Circumference --      Peak Flow --      Pain Score 10/22/23 1048 0     Pain Loc --      Pain Education --      Exclude from Growth Chart --    No data found.  Updated Vital Signs BP 102/65   Pulse 70   Temp 98.1 F (36.7 C) (Oral)   Resp 18   SpO2 95%   Visual Acuity Right Eye Distance:   Left Eye Distance:   Bilateral Distance:    Right Eye Near:   Left Eye Near:    Bilateral Near:     Physical Exam Vitals reviewed.  Constitutional:      General: She is awake. She is not in acute distress.    Appearance: Normal appearance. She is well-developed. She is not ill-appearing.     Comments: Very pleasant female appears stated age in no acute distress sitting comfortably in exam room  HENT:     Head: Normocephalic and atraumatic.     Right Ear: External ear normal.     Left Ear: External ear normal.     Mouth/Throat:     Pharynx: Uvula midline. Postnasal drip present. No oropharyngeal exudate or posterior oropharyngeal erythema.  Neck:     Thyroid : No thyroid  mass,  thyromegaly or thyroid  tenderness.  Cardiovascular:     Rate and Rhythm: Normal rate  and regular rhythm.     Heart sounds: Normal heart sounds, S1 normal and S2 normal. No murmur heard. Pulmonary:     Effort: Pulmonary effort is normal.     Breath sounds: Normal breath sounds. No wheezing, rhonchi or rales.     Comments: Clear auscultation bilaterally Musculoskeletal:     Left hand: No swelling. Normal range of motion. Normal strength. Normal sensation. There is no disruption of two-point discrimination. Normal pulse.     Cervical back: Normal range of motion and neck supple.     Comments: Left hand: No decreased range of motion.  Normal pincer grip strength.  Negative Tinel and Phalen's.  Hand is neurovascular intact.  Lymphadenopathy:     Head:     Right side of head: No submental, submandibular or tonsillar adenopathy.     Left side of head: No submental, submandibular or tonsillar adenopathy.     Cervical: No cervical adenopathy.  Psychiatric:        Behavior: Behavior is cooperative.      UC Treatments / Results  Labs (all labs ordered are listed, but only abnormal results are displayed) Labs Reviewed - No data to display  EKG   Radiology DG Neck Soft Tissue Result Date: 10/22/2023 CLINICAL DATA:  Sensation of abnormality in throat. EXAM: NECK SOFT TISSUES - 1+ VIEW COMPARISON:  None Available. FINDINGS: There is no evidence of retropharyngeal soft tissue swelling or epiglottic enlargement. The cervical airway is unremarkable and no radio-opaque foreign body identified. Degenerative disc disease of the cervical spine at the C4-5 and C5-6 levels. IMPRESSION: No acute findings. Degenerative disc disease of the cervical spine. Electronically Signed   By: Marcey Moan M.D.   On: 10/22/2023 11:55    Procedures Procedures (including critical care time)  Medications Ordered in UC Medications - No data to display  Initial Impression / Assessment and Plan / UC Course  I have  reviewed the triage vital signs and the nursing notes.  Pertinent labs & imaging results that were available during my care of the patient were reviewed by me and considered in my medical decision making (see chart for details).     Patient is well-appearing, afebrile, nontoxic, nontachycardic.  Given ongoing globus sensation soft tissue neck plain films were obtained that showed no acute abnormality.  Unfortunately, we do not have advanced imaging in urgent care.  She did report improvement with PPI and so we will start this medication again and so pantoprazole  was sent to pharmacy.  No indication for dose adjustment based on metabolic panel from 06/29/2023 with a creatinine of 0.98 and fixated creatinine clearance of 63 mL/min.  Will use this for only a few weeks given she does have reduced kidney function.  She was encouraged to use dietary and lifestyle modification for additional symptom relief.  Recommend she follow-up with the ENT for further evaluation and management if her symptoms are not improving so they could hopefully arrange swallow study and additional intervention.  We discussed that if anything worsens she needs to be seen emergently including dysphagia, swelling of her throat, shortness of breath, muffled voice.  Strict return precautions given.    Hand is neurovascular intact.  X-ray was deferred as she had no focal bony tenderness and she denies any recent trauma.  Suspect symptoms are related to carpal tunnel.  I did recommend that she use a brace for comfort and support and was given a brace during her visit today.  If her symptoms not improving  she is to follow-up with [facility] for further evaluation and management.  She was given the contact information for local provider with instruction to call to schedule appointment.  We discussed that if anything worsens she should return for reevaluation.  Final Clinical Impressions(s) / UC Diagnoses   Final diagnoses:  Globus sensation   Carpal tunnel syndrome of left wrist     Discharge Instructions      Your x-ray did not show anything in your throat.  I believe that your symptoms are related to acid reflux so start pantoprazole  on an empty stomach (30 minutes before meal or 2 hours after).  Avoid NSAIDs (aspirin , ibuprofen/Advil, naproxen/Aleve) as well as alcohol.  Make sure that you sit upright for 1 hour after eating and avoid spicy/acidic/fatty foods.  I do recommend that you follow-up with ENT if your symptoms are not improving as you may need additional workup.  If anything worsens and you cannot swallow, you cannot breathe, you feel like your throat is swollen or your voice has changed you need to be seen immediately.  I believe that your symptoms in your hand are related to carpal tunnel.  Use the brace is much as possible but particularly at night.  You can take Tylenol  for pain relief.  Follow-up with orthopedics if your symptoms are not improving.  If anything worsens please return for reevaluation including difficulty moving the hand, increasing numbness or tingling, weakness.     ED Prescriptions     Medication Sig Dispense Auth. Provider   pantoprazole  (PROTONIX ) 20 MG tablet Take 1 tablet (20 mg total) by mouth daily. 14 tablet Bary Limbach K, PA-C      PDMP not reviewed this encounter.   Sherrell Rocky POUR, PA-C 10/22/23 1215

## 2023-10-22 NOTE — Discharge Instructions (Signed)
 Your x-ray did not show anything in your throat.  I believe that your symptoms are related to acid reflux so start pantoprazole  on an empty stomach (30 minutes before meal or 2 hours after).  Avoid NSAIDs (aspirin , ibuprofen/Advil, naproxen/Aleve) as well as alcohol.  Make sure that you sit upright for 1 hour after eating and avoid spicy/acidic/fatty foods.  I do recommend that you follow-up with ENT if your symptoms are not improving as you may need additional workup.  If anything worsens and you cannot swallow, you cannot breathe, you feel like your throat is swollen or your voice has changed you need to be seen immediately.  I believe that your symptoms in your hand are related to carpal tunnel.  Use the brace is much as possible but particularly at night.  You can take Tylenol  for pain relief.  Follow-up with orthopedics if your symptoms are not improving.  If anything worsens please return for reevaluation including difficulty moving the hand, increasing numbness or tingling, weakness.

## 2023-10-27 ENCOUNTER — Ambulatory Visit (INDEPENDENT_AMBULATORY_CARE_PROVIDER_SITE_OTHER): Payer: Medicare PPO

## 2023-10-27 DIAGNOSIS — I5042 Chronic combined systolic (congestive) and diastolic (congestive) heart failure: Secondary | ICD-10-CM | POA: Diagnosis not present

## 2023-10-28 LAB — CUP PACEART REMOTE DEVICE CHECK
Battery Remaining Longevity: 32 mo
Battery Voltage: 2.95 V
Brady Statistic AP VP Percent: 0.29 %
Brady Statistic AP VS Percent: 89.23 %
Brady Statistic AS VP Percent: 0.01 %
Brady Statistic AS VS Percent: 10.47 %
Brady Statistic RA Percent Paced: 85.66 %
Brady Statistic RV Percent Paced: 0.36 %
Date Time Interrogation Session: 20251008022603
HighPow Impedance: 47 Ohm
HighPow Impedance: 65 Ohm
Implantable Lead Connection Status: 753985
Implantable Lead Connection Status: 753985
Implantable Lead Implant Date: 20120702
Implantable Lead Implant Date: 20120702
Implantable Lead Location: 753859
Implantable Lead Location: 753860
Implantable Lead Model: 5076
Implantable Lead Model: 6947
Implantable Pulse Generator Implant Date: 20191011
Lead Channel Impedance Value: 342 Ohm
Lead Channel Impedance Value: 437 Ohm
Lead Channel Impedance Value: 494 Ohm
Lead Channel Pacing Threshold Amplitude: 0.75 V
Lead Channel Pacing Threshold Amplitude: 0.75 V
Lead Channel Pacing Threshold Pulse Width: 0.4 ms
Lead Channel Pacing Threshold Pulse Width: 0.4 ms
Lead Channel Sensing Intrinsic Amplitude: 2.125 mV
Lead Channel Sensing Intrinsic Amplitude: 2.125 mV
Lead Channel Sensing Intrinsic Amplitude: 8.625 mV
Lead Channel Sensing Intrinsic Amplitude: 8.625 mV
Lead Channel Setting Pacing Amplitude: 1.5 V
Lead Channel Setting Pacing Amplitude: 2 V
Lead Channel Setting Pacing Pulse Width: 0.4 ms
Lead Channel Setting Sensing Sensitivity: 0.3 mV
Zone Setting Status: 755011
Zone Setting Status: 755011

## 2023-10-29 NOTE — Progress Notes (Signed)
 Remote ICD Transmission

## 2023-11-01 ENCOUNTER — Ambulatory Visit: Attending: Cardiovascular Disease

## 2023-11-01 DIAGNOSIS — Z95810 Presence of automatic (implantable) cardiac defibrillator: Secondary | ICD-10-CM | POA: Diagnosis not present

## 2023-11-01 DIAGNOSIS — I5042 Chronic combined systolic (congestive) and diastolic (congestive) heart failure: Secondary | ICD-10-CM

## 2023-11-05 ENCOUNTER — Telehealth: Payer: Self-pay

## 2023-11-05 NOTE — Progress Notes (Signed)
 EPIC Encounter for ICM Monitoring  Patient Name: Diana Campbell is a 78 y.o. female Date: 11/05/2023 Primary Care Physican: Leonce Lucie JINNY DEVONNA Primary Cardiologist: Burnard Electrophysiologist: Croitoru 12/23/2022 Office Weight: 202 lbs 03/08/2023 Office Weight: 200 lbs 05/25/2023 Weight: 200 lbs at home 06/10/2023 Office Weight: 187 lbs 08/11/2023 Office Weight: 190 lbs   Clinical Status  Since 28-Oct-2023 Time in AT/AF      0.0 hr/day (0.0%)                                                  Attempted call to patient and unable to reach.  Transmission results reviewed.    Since 09/21/2023 ICM Remote Transmission: [employer] thoracic impedance suggesting normal fluid levels.   Prescribed:  No diuretic (Previously on Furosemide  40 mg 1 tablet as needed) Potassium 20 mEq take 1 tablet by mouth at lunch daily Spironolactone  25 mg take 0.5 tablet (12.5 mg total) by mouth every other day.   Labs: 06/29/2023 Creatinine 0.98, BUN 11, Potassium 3.7, Sodium 143, GFR 59 03/02/2023 Creatinine 0.87, BUN 10, Potassium 3.4, Sodium 146, GFR 68 A complete set of results can be found in Results Review.   Recommendations:  Unable to reach.     Follow-up plan: ICM clinic phone appointment on 12/06/2023.   91 day device clinic remote transmission 01/26/2024.     EP/Cardiology Office Visits:  Recall 06/09/2024 with Dr Francyne.     Recall 08/11/2023 with Dr Darron (yearly).   Copy of ICM check sent to Dr. Francyne.    Remote monitoring is medically necessary for Heart Failure Management.    Daily Thoracic Impedance ICM trend: 08/02/2023 through 11/01/2023.    12-14 Month Thoracic Impedance ICM trend:     [name], RN 11/05/2023 9:44 AM

## 2023-11-05 NOTE — Telephone Encounter (Signed)
 Remote ICM transmission received.  Attempted call to patient regarding ICM remote transmission and no answer or voice mail option.

## 2023-11-08 ENCOUNTER — Ambulatory Visit: Payer: Self-pay | Admitting: Cardiovascular Disease

## 2023-11-18 ENCOUNTER — Ambulatory Visit: Admitting: Orthopedic Surgery

## 2023-11-18 ENCOUNTER — Encounter: Payer: Self-pay | Admitting: Orthopedic Surgery

## 2023-11-18 ENCOUNTER — Telehealth: Payer: Self-pay | Admitting: "Endocrinology

## 2023-11-18 VITALS — BP 121/64 | HR 62

## 2023-11-18 DIAGNOSIS — G5602 Carpal tunnel syndrome, left upper limb: Secondary | ICD-10-CM

## 2023-11-18 DIAGNOSIS — E119 Type 2 diabetes mellitus without complications: Secondary | ICD-10-CM

## 2023-11-18 DIAGNOSIS — Z01818 Encounter for other preprocedural examination: Secondary | ICD-10-CM | POA: Diagnosis not present

## 2023-11-18 MED ORDER — INSULIN ASP PROT & ASP FLEXPEN (70-30) 100 UNIT/ML ~~LOC~~ SUPN
PEN_INJECTOR | SUBCUTANEOUS | 0 refills | Status: DC
Start: 2023-11-18 — End: 2023-11-23

## 2023-11-18 NOTE — Addendum Note (Signed)
 Addended by: JENEAN GREIG ORN on: 11/18/2023 10:28 AM   Modules accepted: Orders

## 2023-11-18 NOTE — Progress Notes (Signed)
 Office Visit Note   Patient: Diana Campbell           Date of Birth: 11/25/1945           MRN: [id number] Visit Date: 11/18/2023 Requested by: Leonce Lucie PARAS, PA-C [address] [place],  [place] [address] PCP: Leonce Lucie PARAS, PA-C  Subjective: Chief Complaint  Patient presents with   Hand Pain    L wrist and hand, numbness in the hand for over 2 mos.     HPI: 78 year old female who has a defibrillator stable heart rate at present presents with 7-month history of pain associated with numbness and tingling of all 5 digits of the left hand.  She cannot tolerate wearing a brace it made the symptoms worse  She also complains of inability to hold onto things she has trouble pricking her hand for her diabetic glucose checks  She has weakness on grip              ROS: Normal  Assessment & Plan:  Images personally read and my interpretation :  no images  Visit Diagnoses:  1. Carpal tunnel syndrome, left upper limb     Plan: left carpal tunnel release    PLAN:   Surgical release of the left carpal tunnel  The patient is well aware of the risks and benefits of carpal tunnel release surgery and the postoperative outcome as she had a carpal tunnel release of the right wrist back in 2017  The procedure has been fully reviewed with the patient; The risks and benefits of surgery have been discussed and explained and understood. Alternative treatment has also been reviewed, questions were encouraged and answered. The postoperative plan is also been reviewed.     Follow-Up Instructions: No follow-ups on file.   Orders:  No orders of the defined types were placed in this encounter.     Objective: Vital Signs: BP 121/64   Pulse 62    No Known Allergies'  Current Outpatient Medications  Medication Instructions   ACCU-CHEK FASTCLIX LANCETS MISC 1 each, Does not apply, 2 times daily   ACCU-CHEK GUIDE test strip USE AS INSTRUCTED TO TEST BLOOD GLUCOSE FOUR  TIMES DAILY   acetaminophen  (TYLENOL ) 650 mg, 2 times daily   aprepitant (EMEND) 125 MG capsule Daily   aspirin  EC 81 mg, Oral, Daily   atorvastatin  (LIPITOR) 20 mg, Oral, Daily   Blood Glucose Monitoring Suppl (ACCU-CHEK GUIDE) w/Device KIT Use as directed to check blood glucose four times daily   carvedilol  (COREG ) 3.125 mg, Oral, 2 times daily   Continuous Glucose Receiver (FREESTYLE LIBRE 3 READER) DEVI 1 Piece, Does not apply, Once PRN   Continuous Glucose Sensor (FREESTYLE LIBRE 3 PLUS SENSOR) MISC Change sensor every 15 days.   donepezil (ARICEPT) 5 mg, Daily   Insulin  Asp Prot & Asp FlexPen (NOVOLOG  70/30 MIX) (70-30) 100 UNIT/ML FlexPen Inject 10 units with breakfast and 10 units with supper as directed by provider.   Jardiance  10 mg, Oral, Daily   Lancets Misc. (ACCU-CHEK FASTCLIX LANCET) KIT 1 each, Does not apply, 2 times daily   pantoprazole  (PROTONIX ) 20 mg, Oral, Daily   potassium chloride  SA (KLOR-CON  M) 20 MEQ tablet 20 mEq, Oral, Daily with lunch   sacubitril -valsartan  (ENTRESTO ) 49-51 MG 1 tablet, Oral, 2 times daily   spironolactone  (ALDACTONE ) 12.5 mg, Oral, Daily    Review of Systems Review of Systems Currently no chest pain arrhythmias shortness of breath or  other joint maladies  Past Medical History:  Diagnosis Date   Arthritis    Automatic implantable cardioverter-defibrillator in situ 07/2010   Cardiac abnormality    with defibrilator   Chronic combined systolic and diastolic CHF (congestive heart failure) (HCC) 09/04/2015   Diabetes mellitus    H/O cardiac arrest 2006   Hypercholesteremia    Hypertension    Neuropathy     Past Surgical History:  Procedure Laterality Date   CARDIAC CATHETERIZATION  06/22/2003   Severe nonischemic cardiomyopathy, 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   CARDIAC CATHETERIZATION  04/14/2000   Normal coronary arteries.   CARDIAC  DEFIBRILLATOR PLACEMENT  07/21/2010   Medtronic Protecta XT DR, model#[id number], serial#[id number]   CARDIOVASCULAR STRESS TEST  04/05/2000   Scintigraphic evidence of mild LV dilatation and global LV dysfunction with an EF 41% with mild inferolateral ischemia.   CARPAL TUNNEL RELEASE Right 01/23/2016   Procedure: CARPAL TUNNEL RELEASE;  Surgeon: Taft FORBES Minerva, MD;  Location: [facility];  Service: Orthopedics;  Laterality: Right;  pt knows to arrive at 6:15   COLONOSCOPY  03/30/2011   Procedure: COLONOSCOPY;  Surgeon: Oneil DELENA Budge, MD;  Location: [facility];  Service: Gastroenterology;  Laterality: N/A;   DORSAL COMPARTMENT RELEASE Right 08/11/2013   Procedure: RELEASE DORSAL COMPARTMENT (DEQUERVAIN);  Surgeon: Taft FORBES Minerva, MD;  Location: [facility];  Service: Orthopedics;  Laterality: Right;   ICD GENERATOR CHANGEOUT N/A 10/29/2017   Procedure: ICD GENERATOR CHANGEOUT;  Surgeon: Francyne Headland, MD;  Location: [facility];  Service: Cardiovascular;  Laterality: N/A;   TRANSTHORACIC ECHOCARDIOGRAM  08/03/2011   EF 45-50%, mildly reduced LV systolic function.    History reviewed. No pertinent family history.   Social History   Tobacco Use   Smoking status: Never   Smokeless tobacco: Current    Types: Snuff   Tobacco comments:    3 dips per day  Vaping Use   Vaping status: Never Used  Substance Use Topics   Alcohol use: No   Drug use: No    No Known Allergies  Current Outpatient Medications  Medication Sig Dispense Refill   ACCU-CHEK FASTCLIX LANCETS MISC 1 each by Does not apply route 2 (two) times daily. 200 each 5   ACCU-CHEK GUIDE test strip USE AS INSTRUCTED TO TEST BLOOD GLUCOSE FOUR TIMES DAILY 400 strip 1   acetaminophen  (TYLENOL ) 325 MG tablet Take 650 mg by mouth in the morning and at bedtime.     aprepitant (EMEND) 125 MG capsule Take by mouth daily.     aspirin  EC 81 MG tablet Take 1 tablet (81 mg total) by mouth daily. 90 tablet 3   atorvastatin  (LIPITOR)  20 MG tablet Take 1 tablet (20 mg total) by mouth daily. 90 tablet 2   Blood Glucose Monitoring Suppl (ACCU-CHEK GUIDE) w/Device KIT Use as directed to check blood glucose four times daily 1 kit 0   carvedilol  (COREG ) 3.125 MG tablet Take 1 tablet (3.125 mg total) by mouth 2 (two) times daily. 180 tablet 3   Continuous Glucose Receiver (FREESTYLE LIBRE 3 READER) DEVI 1 Piece by Does not apply route once as needed for up to 1 dose. 1 each 0   Continuous Glucose Sensor (FREESTYLE LIBRE 3 PLUS SENSOR) MISC Change sensor every 15 days. 2 each 2   donepezil (ARICEPT) 5 MG tablet Take 5 mg by mouth daily.     Insulin  Asp Prot &  Asp FlexPen (NOVOLOG  70/30 MIX) (70-30) 100 UNIT/ML FlexPen Inject 10 units with breakfast and 10 units with supper as directed by provider. 30 mL 0   JARDIANCE  10 MG TABS tablet TAKE 1 TABLET BY MOUTH EVERY DAY 90 tablet 3   Lancets Misc. (ACCU-CHEK FASTCLIX LANCET) KIT 1 each by Does not apply route 2 (two) times daily. 1 kit 2   pantoprazole  (PROTONIX ) 20 MG tablet Take 1 tablet (20 mg total) by mouth daily. 14 tablet 0   potassium chloride  SA (KLOR-CON  M) 20 MEQ tablet Take 1 tablet (20 mEq total) by mouth daily with lunch. 9 tablet 1   sacubitril -valsartan  (ENTRESTO ) 49-51 MG TAKE 1 TABLET BY MOUTH TWICE A DAY 180 tablet 3   spironolactone  (ALDACTONE ) 25 MG tablet TAKE 1/2 TABLET BY MOUTH EVERY DAY 45 tablet 11   No current facility-administered medications for this visit.     Physical Exam BP 121/64   Pulse 62   The patient is well developed well nourished and well groomed.  Orientation to person place and time is normal  Mood is pleasant.  Ambulatory status normal gait and stance   Left upper extremity examination reveals the following:  SWELLING no TENDERNESS OVER THE CARPAL TUNNEL yes this Range of motion of the wrist normal  Motor exam normal   Wrist joint is stable  Provocative tests for carpal tunnel Phalen's test it if Carpal tunnel compression  test positive Tinel's test negative  Pulses are normal in the radial and ulnar artery with a normal Allen's test.  SENSORY EXAM: All 5 digits involved sensory loss  Opposite extremity noncontributory

## 2023-11-18 NOTE — Telephone Encounter (Signed)
 Pt is stating she needs Novolog  sent into [facility], states she will not have enough to get her to her appt 11/23/23, I know this is Nida's pt

## 2023-11-18 NOTE — Progress Notes (Signed)
  Intake history:  Chief Complaint  Patient presents with   Hand Pain    L wrist and hand, numbness in the hand for over 2 mos.      BP 121/64   Pulse 62  There is no height or weight on file to calculate BMI.  [facility]  WHAT ARE WE SEEING YOU FOR TODAY?   bilateral wrist(s), bilateral hand(s) L > R  How long has this bothered you? (DOI?DOS?WS?)  Over 2 mos  Was there an injury? No  Anticoag.  No   Any ALLERGIES ______________________________________________   Treatment:  Have you taken:  Tylenol  No  Advil No  Had PT No  Had injection No  Other  _________________________

## 2023-11-18 NOTE — Patient Instructions (Signed)
 Your surgery will be at [facility] by Dr Margrette  The hospital will contact you with a preoperative appointment to discuss Anesthesia.  Please arrive on time or 15 minutes early for the preoperative appointment, they have a very tight schedule if you are late or do not come in your surgery will be cancelled.  The phone number is [phone]. Please bring your medications with you for the appointment. They will tell you the arrival time and medication instructions when you have your preoperative evaluation. Do not wear nail polish the day of your surgery and if you take Phentermine you need to stop this medication ONE WEEK prior to your surgery. If you take Invokana, Farxiga, Jardiance, or Steglatro) - Hold 72 hours before the procedure.  If you take Ozempic,  Mounjaro, Bydureon or Trulicity do not take for 8 days before your surgery. If you take Victoza, Rybelsis, Saxenda or Adlyxi stop 24 hours before the procedure.  Please arrive at the hospital 2 hours before procedure if scheduled at 9:30 or later in the day or at the time the nurse tells you at your preoperative visit.   If you have my chart do not use the time given in my chart use the time given to you by the nurse during your preoperative visit.   Your surgery  time may change. Please be available for phone calls the day of your surgery and the day before. The Short Stay department may need to discuss changes about your surgery time. Not reaching the you could lead to procedure delays and possible cancellation.  You must have a ride home and someone to stay with you for 24 to 48 hours. The person taking you home will receive and sign for the your discharge instructions.  Please be prepared to give your support person's name and telephone number to [facility] [employer]. Dr Margrette will need that name and phone number post procedure.

## 2023-11-18 NOTE — Telephone Encounter (Signed)
I just sent in a refill for her.

## 2023-11-23 ENCOUNTER — Encounter: Payer: Self-pay | Admitting: "Endocrinology

## 2023-11-23 ENCOUNTER — Ambulatory Visit: Admitting: "Endocrinology

## 2023-11-23 VITALS — BP 124/66 | HR 60 | Ht 66.0 in | Wt 192.6 lb

## 2023-11-23 DIAGNOSIS — E782 Mixed hyperlipidemia: Secondary | ICD-10-CM

## 2023-11-23 DIAGNOSIS — I10 Essential (primary) hypertension: Secondary | ICD-10-CM | POA: Diagnosis not present

## 2023-11-23 DIAGNOSIS — E119 Type 2 diabetes mellitus without complications: Secondary | ICD-10-CM

## 2023-11-23 DIAGNOSIS — E66811 Obesity, class 1: Secondary | ICD-10-CM

## 2023-11-23 DIAGNOSIS — E6609 Other obesity due to excess calories: Secondary | ICD-10-CM

## 2023-11-23 DIAGNOSIS — Z794 Long term (current) use of insulin: Secondary | ICD-10-CM | POA: Diagnosis not present

## 2023-11-23 DIAGNOSIS — Z6832 Body mass index (BMI) 32.0-32.9, adult: Secondary | ICD-10-CM

## 2023-11-23 LAB — ENHANCED LIVER FIBROSIS (ELF)

## 2023-11-23 LAB — COMPREHENSIVE METABOLIC PANEL WITH GFR
ALT: 8 IU/L (ref 0–32)
AST: 15 IU/L (ref 0–40)
Albumin: 4 g/dL (ref 3.8–4.8)
Alkaline Phosphatase: 85 IU/L (ref 49–135)
BUN/Creatinine Ratio: 14 (ref 12–28)
BUN: 15 mg/dL (ref 8–27)
Bilirubin Total: 0.6 mg/dL (ref 0.0–1.2)
CO2: 26 mmol/L (ref 20–29)
Calcium: 9.6 mg/dL (ref 8.7–10.3)
Chloride: 105 mmol/L (ref 96–106)
Creatinine, Ser: 1.05 mg/dL — ABNORMAL HIGH (ref 0.57–1.00)
Globulin, Total: 2.7 g/dL (ref 1.5–4.5)
Glucose: 112 mg/dL — ABNORMAL HIGH (ref 70–99)
Potassium: 3.6 mmol/L (ref 3.5–5.2)
Sodium: 144 mmol/L (ref 134–144)
Total Protein: 6.7 g/dL (ref 6.0–8.5)
eGFR: 54 mL/min/1.73 — ABNORMAL LOW (ref >59)

## 2023-11-23 LAB — LIPID PANEL
Chol/HDL Ratio: 2.3 ratio (ref 0.0–4.4)
Cholesterol, Total: 145 mg/dL (ref 100–199)
HDL: 64 mg/dL (ref >39)
LDL Chol Calc (NIH): 70 mg/dL (ref 0–99)
Triglycerides: 49 mg/dL (ref 0–149)
VLDL Cholesterol Cal: 11 mg/dL (ref 5–40)

## 2023-11-23 LAB — POCT GLYCOSYLATED HEMOGLOBIN (HGB A1C): HbA1c, POC (controlled diabetic range): 7.8 % — AB (ref 0.0–7.0)

## 2023-11-23 LAB — FIB-4 W/REFLEX TO ELF
FIB-4 Index: 1.64 (ref 0.00–2.67)
Platelets: 256 x10E3/uL (ref 150–450)

## 2023-11-23 MED ORDER — INSULIN ASP PROT & ASP FLEXPEN (70-30) 100 UNIT/ML ~~LOC~~ SUPN: PEN_INJECTOR | SUBCUTANEOUS | 1 refills | Status: AC

## 2023-11-23 NOTE — Progress Notes (Signed)
 11/23/2023  Endocrinology follow-up note  Subjective:    Patient ID: Diana Campbell, female    DOB: Jan 28, 1945, PCP Patient, No Pcp Per   Past Medical History:  Diagnosis Date   Arthritis    Automatic implantable cardioverter-defibrillator in situ 07/2010   Cardiac abnormality    with defibrilator   Chronic combined systolic and diastolic CHF (congestive heart failure) (HCC) 09/04/2015   Diabetes mellitus    H/O cardiac arrest 2006   Hypercholesteremia    Hypertension    Neuropathy    Past Surgical History:  Procedure Laterality Date   CARDIAC CATHETERIZATION  06/22/2003   Severe nonischemic cardiomyopathy, 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   CARDIAC CATHETERIZATION  04/14/2000   Normal coronary arteries.   CARDIAC DEFIBRILLATOR PLACEMENT  07/21/2010   Medtronic Protecta XT DR, model#[id number], serial#[id number]   CARDIOVASCULAR STRESS TEST  04/05/2000   Scintigraphic evidence of mild LV dilatation and global LV dysfunction with an EF 41% with mild inferolateral ischemia.   CARPAL TUNNEL RELEASE Right 01/23/2016   Procedure: CARPAL TUNNEL RELEASE;  Surgeon: Taft FORBES Minerva, MD;  Location: [facility];  Service: Orthopedics;  Laterality: Right;  pt knows to arrive at 6:15   COLONOSCOPY  03/30/2011   Procedure: COLONOSCOPY;  Surgeon: Oneil DELENA Budge, MD;  Location: [facility];  Service: Gastroenterology;  Laterality: N/A;   DORSAL COMPARTMENT RELEASE Right 08/11/2013   Procedure: RELEASE DORSAL COMPARTMENT (DEQUERVAIN);  Surgeon: Taft FORBES Minerva, MD;  Location: [facility];  Service: Orthopedics;  Laterality: Right;   ICD GENERATOR CHANGEOUT N/A 10/29/2017   Procedure: ICD GENERATOR CHANGEOUT;  Surgeon: Francyne Headland, MD;  Location: [facility];  Service: Cardiovascular;  Laterality: N/A;   TRANSTHORACIC ECHOCARDIOGRAM  08/03/2011   EF 45-50%, mildly reduced LV systolic function.   Social  History   Socioeconomic History   Marital status: Married    Spouse name: Not on file   Number of children: Not on file   Years of education: Not on file   Highest education level: Not on file  Occupational History   Not on file  Tobacco Use   Smoking status: Never   Smokeless tobacco: Current    Types: Snuff   Tobacco comments:    3 dips per day  Vaping Use   Vaping status: Never Used  Substance and Sexual Activity   Alcohol use: No   Drug use: No   Sexual activity: Yes    Birth control/protection: None  Other Topics Concern   Not on file  Social History Narrative   Not on file   Social Drivers of Health   Financial Resource Strain: Not on file  Food Insecurity: Low Risk  (11/04/2023)   Received from [facility]   Hunger Vital Sign    Within the past 12 months, you worried that your food would run out before you got money to buy more: Never true    Within the past 12 months, the food you bought just didn't last and you didn't have money to get more. : Never true  Transportation Needs: No Transportation Needs (11/04/2023)   Received from [facility]    In the past 12 months, has lack of reliable transportation kept you from medical appointments, meetings, work or from getting things needed for daily living? : No  Physical Activity: Not on file  Stress: Not on file  Social Connections: Not on  file   Outpatient Encounter Medications as of 11/23/2023  Medication Sig   [DISCONTINUED] Insulin  Asp Prot & Asp FlexPen (NOVOLOG  70/30 MIX) (70-30) 100 UNIT/ML FlexPen Inject 10 units with breakfast and 10 units with supper as directed by provider. (Patient taking differently: Inject 10-20 Units into the skin 2 (two) times daily.)   ACCU-CHEK FASTCLIX LANCETS MISC 1 each by Does not apply route 2 (two) times daily.   ACCU-CHEK GUIDE test strip USE AS INSTRUCTED TO TEST BLOOD GLUCOSE FOUR TIMES DAILY   acetaminophen  (TYLENOL ) 325 MG tablet Take 650 mg by mouth  in the morning and at bedtime.   aprepitant (EMEND) 125 MG capsule Take by mouth daily.   aspirin  EC 81 MG tablet Take 1 tablet (81 mg total) by mouth daily.   atorvastatin  (LIPITOR) 20 MG tablet Take 1 tablet (20 mg total) by mouth daily.   Blood Glucose Monitoring Suppl (ACCU-CHEK GUIDE) w/Device KIT Use as directed to check blood glucose four times daily   carvedilol  (COREG ) 3.125 MG tablet Take 1 tablet (3.125 mg total) by mouth 2 (two) times daily.   Continuous Glucose Receiver (FREESTYLE LIBRE 3 READER) DEVI 1 Piece by Does not apply route once as needed for up to 1 dose.   Continuous Glucose Sensor (FREESTYLE LIBRE 3 PLUS SENSOR) MISC Change sensor every 15 days.   donepezil (ARICEPT) 5 MG tablet Take 5 mg by mouth daily.   Insulin  Asp Prot & Asp FlexPen (NOVOLOG  70/30 MIX) (70-30) 100 UNIT/ML FlexPen Inject 15 units with breakfast and 15 units with supper as directed by provider.   JARDIANCE  10 MG TABS tablet TAKE 1 TABLET BY MOUTH EVERY DAY   Lancets Misc. (ACCU-CHEK FASTCLIX LANCET) KIT 1 each by Does not apply route 2 (two) times daily.   pantoprazole  (PROTONIX ) 20 MG tablet Take 1 tablet (20 mg total) by mouth daily.   potassium chloride  SA (KLOR-CON  M) 20 MEQ tablet Take 1 tablet (20 mEq total) by mouth daily with lunch.   sacubitril -valsartan  (ENTRESTO ) 49-51 MG TAKE 1 TABLET BY MOUTH TWICE A DAY   spironolactone  (ALDACTONE ) 25 MG tablet TAKE 1/2 TABLET BY MOUTH EVERY DAY   No facility-administered encounter medications on file as of 11/23/2023.   ALLERGIES: No Known Allergies VACCINATION STATUS: Immunization History  Administered Date(s) Administered   Influenza-Unspecified 11/19/2017    Diabetes She is returns for follow-up in the management of her currently uncontrolled type 2 diabetes, hyperlipidemia, hypertension.   Onset time: She was diagnosed with diabetes at approximately 78 years of age.  She is accompanied by her daughter to this clinic today.  She was given the  readjusted dose of NovoLog  70/30 during her last visit.  She presents with improved glycemic profile averaging between 157 and 160 3 mg per DL for the most recent 30 days.  Her point-of-care A1c is 7.8% improving from 8.4% during her last visit.  She did not document or report any significant hypoglycemia.     She remains on Jardiance  10 mg p.o. daily at breakfast along with her NovoLog  70/30.   Jardiance  was started by her cardiologist.  She has cardiac defibrillator in place.   Diabetic complications include nephropathy. Risk factors for coronary artery disease include diabetes mellitus, dyslipidemia, hypertension, sedentary lifestyle and tobacco exposure. Her weight is fluctuating minimally, complains of poor appetite.   She  regularly eats  junk food.  She has not had a previous visit with a dietitian (She declined a referral to CDE.)  She has  controlled her lipid panel currently on Lipitor 20 mg p.o. nightly.     An ACE inhibitor/angiotensin II receptor blocker is being taken. Eye exam is current.    Hyperlipidemia This is a chronic problem. The current episode started more than 1 year ago. Pertinent negatives include no chest pain, myalgias or shortness of breath. Current antihyperlipidemic treatment includes statins. Risk factors for coronary artery disease include dyslipidemia, diabetes mellitus and a sedentary lifestyle.  Her previsit labs show controlled LDL.    Hypertension This is a chronic problem. The current episode started more than 1 year ago. The problem is controlled. Pertinent negatives include no chest pain, headaches, palpitations or shortness of breath. Past treatments include ACE inhibitors. Hypertensive end-organ damage includes kidney disease.    Review of systems: Limited as above.   Objective:    BP 124/66   Pulse 60   Ht 5' 6 (1.676 m)   Wt 192 lb 9.6 oz (87.4 kg)   BMI 31.09 kg/m   Wt Readings from Last 3 Encounters:  11/23/23 192 lb 9.6 oz (87.4 kg)   08/11/23 190 lb 9.6 oz (86.5 kg)  07/07/23 193 lb 6.4 oz (87.7 kg)     Physical Exam- Limited  Constitutional:  Body mass index is 31.09 kg/m. , not in acute distress, normal state of mind Eyes:  EOMI, no exophthalmos       Latest Ref Rng & Units 11/08/2023    8:25 AM 06/29/2023   10:57 AM 03/02/2023   10:09 AM  CMP  Glucose 70 - 99 mg/dL 887  738  89   BUN 8 - 27 mg/dL 15  11  10    Creatinine 0.57 - 1.00 mg/dL 8.94  9.01  9.12   Sodium 134 - 144 mmol/L 144  143  146   Potassium 3.5 - 5.2 mmol/L 3.6  3.7  3.4   Chloride 96 - 106 mmol/L 105  105  106   CO2 20 - 29 mmol/L 26  24  26    Calcium  8.7 - 10.3 mg/dL 9.6  9.3  9.4   Total Protein 6.0 - 8.5 g/dL 6.7  6.1  6.5   Total Bilirubin 0.0 - 1.2 mg/dL 0.6  0.3  0.4   Alkaline Phos 49 - 135 IU/L 85  78  77   AST 0 - 40 IU/L 15  11  16    ALT 0 - 32 IU/L 8  9  10      Lab Results  Component Value Date   HGBA1C 7.8 (A) 11/23/2023   HGBA1C 8.4 07/07/2023   HGBA1C 6.8 03/08/2023   MICROALBUR 0.7 08/28/2019   MICROALBUR 0.3 04/14/2016   Lipid Panel     Component Value Date/Time   CHOL 145 11/08/2023 0825   TRIG 49 11/08/2023 0825   HDL 64 11/08/2023 0825   CHOLHDL 2.3 11/08/2023 0825   CHOLHDL 3.5 08/28/2019 0851   VLDL 14 09/26/2015 0856   LDLCALC 70 11/08/2023 0825   LDLCALC 141 (H) 08/28/2019 0851     Assessment & Plan:   1. Type 2 diabetes mellitus with long-term current use of insulin  (HCC)  -Her diabetes is complicated by cardiomyopathy and she remains at a high risk for more acute and chronic complications of diabetes which include CAD, CVA, CKD, retinopathy, and neuropathy. These are all discussed in detail with the patient.  Mrs. Vanvalkenburgh presents with better blood glucose profile averaging 159 mg per DL with no hypoglycemia.     - I have  re-counseled the patient on diet management and weight loss  by adopting a carbohydrate restricted / protein rich  Diet.  She did not engage with lifestyle medicine  optimally.  - Suggestion is made for her to avoid simple carbohydrates  from her diet including Cakes, Sweet Desserts, Ice Cream, Soda (diet and regular), Sweet Tea, Candies, Chips, Cookies, Store Bought Juices, Alcohol , Artificial Sweeteners,  Coffee Creamer, and Sugar-free Products, Lemonade. This will help patient to have more stable blood glucose profile and potentially avoid unintended weight gain.  - Patient is advised to stick to a routine mealtimes to eat 3 meals a day and avoid unnecessary snacks specially with ultra processed foods.    - She declined referral to a  CDE for individualized DM education.  - I have approached patient with the following individualized plan to manage diabetes and patient agrees.   - Patient  worries a lot about hypoglycemia.  Her daughter recently moved in with her and is offering to help. - All efforts will be to avoid hypoglycemia in this patient.   - She is still did not get her CGM, largely because she prefers to stay on her current monitoring options with glucometer.    - She is advised to increase her NovoLog  70/30 to 15 units with breakfast and 15 units with supper  for Premeal blood glucose readings above 90 mg per DL.    She is advised to continue Jardiance  10 mg p.o. daily at breakfast.   -She is advised to continue monitoring blood glucose 4 times a day-daily before meals and at bedtime.    -Patient is encouraged to call clinic for blood glucose levels less than 70 or above 200 mg /dl.  -Her most recent labs show normal renal function.  She would have benefited from low-dose metformin, however she wishes to avoid it at this time.    - Patient specific target  for A1c; LDL, HDL, Triglycerides,were discussed in detail.  2) BP/HTN:  -Her blood pressure is controlled., advised to continue  carvedilol  12.5 mg p.o. twice daily, spironolactone  25 mg p.o. once a day.   3) Lipids/HPL: Recent lipid panel showed improved lipid panel with LDL at  70, progressively improving.  She is advised to continue Lipitor 20 mg p.o. nightly.          4)  Weight/Diet: Her BMI 31.09 kg/m -   This is complicating her diabetes care.  She is a candidate for modest weight loss.    She declined CDE consult . exercise, and carbohydrates information provided.  5) Chronic Care/Health Maintenance:  -Patient is on ACEI/ARB and Statin medications and encouraged to continue to follow up with Ophthalmology, Podiatrist at least yearly or according to recommendations, and advised to  stay away from smoking. I have recommended yearly flu vaccine and pneumonia vaccination at least every 5 years; moderate intensity exercise for up to 150 minutes weekly; and  sleep for at least 7 hours a day.  -She was not ready for foot exam today.  She will be approached again.   - I advised patient to maintain close follow up with Patient, No Pcp Per for primary care needs.  I spent  26  minutes in the care of the patient today including review of labs from CMP, Lipids, Thyroid  Function, Hematology (current and previous including abstractions from other facilities); face-to-face time discussing  her blood glucose readings/logs, discussing hypoglycemia and hyperglycemia episodes and symptoms, medications doses, her options of short and long  term treatment based on the latest standards of care / guidelines;  discussion about incorporating lifestyle medicine;  and documenting the encounter. Risk reduction counseling performed per USPSTF guidelines to reduce  obesity and cardiovascular risk factors.     Please refer to Patient Instructions for Blood Glucose Monitoring and Insulin /Medications Dosing Guide  in media tab for additional information. Please  also refer to  Patient Self Inventory in the Media  tab for reviewed elements of pertinent patient history.  Diana Campbell participated in the discussions, expressed understanding, and voiced agreement with the above plans.  All  questions were answered to her satisfaction. she is encouraged to contact clinic should she have any questions or concerns prior to her return visit.     Follow up plan: -Return in about 5 months (around 04/22/2024) for Bring Meter/CGM Device/Logs- A1c in Office.  Ranny Earl, MD Phone: [phone]  Fax: [phone]  -  This note was partially dictated with voice recognition software. Similar sounding words can be transcribed inadequately or may not  be corrected upon review.  11/23/2023, 1:46 PM

## 2023-11-23 NOTE — Patient Instructions (Signed)

## 2023-12-02 NOTE — Patient Instructions (Signed)
 Diana Campbell  12/02/2023     @[email or web address] @   Your procedure is scheduled on 12/07/2023.   Report to [facility] at  [phone] A.M.   Call this number if you have problems the morning of surgery:  [phone]  If you experience any cold or flu symptoms such as cough, fever, chills, shortness of breath, etc. between now and your scheduled surgery, please notify us  at the above number.   Remember:         Your last dose of jardiance  should be on 12/03/2023.        Take 1/2 of your usual night time insulin  the night before your procedure.        DO NOT take any medications for diabetes the morning of your procedure.   Do not eat after midnight.   You may drink clear liquids until 0445 am on 12/07/2023.       Clear liquids allowed are:                    Water , Carbonated beverages (diabetics please choose diet or no sugar options), Black Coffee Only (No creamer, milk or cream, including half & half and powdered creamer), and Clear Sports drink (No red color; diabetics please choose diet or no sugar options)          At 0445 am on 12/07/2023, drink 1-12 G-zero or 12 oz of water . You can have nothing else after this.    Take these medicines the morning of surgery with A SIP OF WATER                   carvedilol , donepezil, pantoprazole , entresto .    Do not wear jewelry, make-up or nail polish, including gel polish,  artificial nails, or any other type of covering on natural nails (fingers and  toes).  Do not wear lotions, powders, or perfumes, or deodorant.  Do not shave 48 hours prior to surgery.  Men may shave face and neck.  Do not bring valuables to the hospital.  [facility] is not responsible for any belongings or valuables.  Contacts, dentures or bridgework may not be worn into surgery.  Leave your suitcase in the car.  After surgery it may be brought to your room.  For patients admitted to the hospital, discharge time will be determined by  your treatment team.  Patients discharged the day of surgery will not be allowed to drive home and must  have someone with them for 24 hours.    Special instructions:   DO NOT smoke tobacco or vape for 24 hours before your procedure.  Please read over the following fact sheets that you were given. Coughing and Deep Breathing, Surgical Site Infection Prevention, Anesthesia Post-op Instructions, and Care and Recovery After Surgery      Open Carpal Tunnel Release: What to Expect Open carpal tunnel release is a surgery to relieve symptoms caused by carpal tunnel syndrome (CTS). CTS causes swelling in a narrow space in your wrist. The swelling pinches the median nerve, causing pain, numbness, and weakness in your hand. You may need this surgery if other treatments haven't helped your symptoms. The surgery involves cutting a ligament in your wrist to relieve pressure on the median nerve. Tell a health care provider about: Any allergies you have. All medicines you take. These include vitamins, herbs, eye drops, and creams. Any problems you or family members have had with  anesthesia. Any bleeding problems you have. Any surgeries you've had. Any medical problems you have. Whether you're pregnant or may be pregnant. What are the risks? Your health care provider will talk with you about risks. These may include: Infection. Bleeding. Allergic reactions to medicines. Damage to the nerve, a blood vessel, or other nearby structures. Failed treatment. The surgery fails to treat your symptoms or make your symptoms worse. Long-term weakness of the hand. What happens before? When to stop eating and drinking Eat and drink only as you've been told. You may be told this: 8 hours before your surgery Stop eating most foods. Do not eat meat, fried foods, or fatty foods. Eat only light foods, such as toast or crackers. All liquids are OK except energy drinks and alcohol. 6 hours before your  surgery Stop eating. Drink only clear liquids, such as water , clear fruit juice, black coffee, plain tea, and sports drinks. Do not drink energy drinks or alcohol. 2 hours before your surgery Stop drinking all liquids. You may be allowed to take medicines with small sips of water . If you do not eat and drink as told, your surgery may be delayed or canceled. Medicines Ask about changing or stopping: Any medicines you take. Any vitamins, herbs, or supplements you take. Do not take aspirin  or ibuprofen unless you're told to. Surgery safety For your safety, you may: Need to wash your skin with a soap that kills germs. Get antibiotics. Have your surgery site marked. Have hair removed at the surgery site. General instructions Ask if you'll be staying overnight in the hospital. If you'll be going home right after the surgery, plan to have a responsible adult: Drive you home from the hospital or clinic. You won't be allowed to drive. Stay with you for the time you're told. Do not smoke, vape, or use nicotine or tobacco for at least 4 weeks before the surgery. What happens during open carpal tunnel release?  An IV will be put into a vein in your hand or arm. You may be given: A sedative to help you relax. Anesthesia to keep you from feeling pain. A cut will be made in your wrist, on the same side as your palm. The skin of your wrist will be spread to expose the transverse carpal ligament. The ligament will be cut to make more room in the carpal tunnel space. Your cut will be closed with stitches or staples. A compression bandage will be wrapped around your hand and wrist. These steps may vary. Ask what you can expect. What happens after? You'll be watched closely until you leave. This includes checking your pain level, blood pressure, heart rate, and breathing rate. You'll be given pain medicine as needed. A splint or brace may be placed over your bandage. This will hold your hand and  wrist in place while you heal. This information is not intended to replace advice given to you by your health care provider. Make sure you discuss any questions you have with your health care provider. Document Revised: 10/05/2022 Document Reviewed: 10/05/2022 Elsevier Patient Education  2024 [employer]General Anesthesia, Adult, Care After The following information offers guidance on how to care for yourself after your procedure. Your health care provider may also give you more specific instructions. If you have problems or questions, contact your health care provider. What can I expect after the procedure? After the procedure, it is common for people to: Have pain or discomfort at the IV site. Have nausea or vomiting. Have  a sore throat or hoarseness. Have trouble concentrating. Feel cold or chills. Feel weak, sleepy, or tired (fatigue). Have soreness and body aches. These can affect parts of the body that were not involved in surgery. Follow these instructions at home: For the time period you were told by your health care provider:  Rest. Do not participate in activities where you could fall or become injured. Do not drive or use machinery. Do not drink alcohol. Do not take sleeping pills or medicines that cause drowsiness. Do not make important decisions or sign legal documents. Do not take care of children on your own. General instructions Drink enough fluid to keep your urine pale yellow. If you have sleep apnea, surgery and certain medicines can increase your risk for breathing problems. Follow instructions from your health care provider about wearing your sleep device: Anytime you are sleeping, including during daytime naps. While taking prescription pain medicines, sleeping medicines, or medicines that make you drowsy. Return to your normal activities as told by your health care provider. Ask your health care provider what activities are safe for you. Take over-the-counter  and prescription medicines only as told by your health care provider. Do not use any products that contain nicotine or tobacco. These products include cigarettes, chewing tobacco, and vaping devices, such as e-cigarettes. These can delay incision healing after surgery. If you need help quitting, ask your health care provider. Contact a health care provider if: You have nausea or vomiting that does not get better with medicine. You vomit every time you eat or drink. You have pain that does not get better with medicine. You cannot urinate or have bloody urine. You develop a skin rash. You have a fever. Get help right away if: You have trouble breathing. You have chest pain. You vomit blood. These symptoms may be an emergency. Get help right away. Call 911. Do not wait to see if the symptoms will go away. Do not drive yourself to the hospital. Summary After the procedure, it is common to have a sore throat, hoarseness, nausea, vomiting, or to feel weak, sleepy, or fatigue. For the time period you were told by your health care provider, do not drive or use machinery. Get help right away if you have difficulty breathing, have chest pain, or vomit blood. These symptoms may be an emergency. This information is not intended to replace advice given to you by your health care provider. Make sure you discuss any questions you have with your health care provider. Document Revised: 04/04/2021 Document Reviewed: 04/04/2021 Elsevier Patient Education  2024 [employer].How to Use Chlorhexidine  at Home in the Shower Chlorhexidine  gluconate (CHG) is a germ-killing (antiseptic) wash that's used to clean the skin. It can get rid of the germs that normally live on the skin and can keep them away for about 24 hours. If you're having surgery, you may be told to shower with CHG at home the night before surgery. This can help lower your risk for infection. To use CHG wash in the shower, follow the steps  below. Supplies needed: CHG body wash. Clean washcloth. Clean towel. How to use CHG in the shower Follow these steps unless you're told to use CHG in a different way: Start the shower. Use your normal soap and shampoo to wash your face and hair. Turn off the shower or move out of the shower stream. Pour CHG onto a clean washcloth. Do not use any type of brush or rough sponge. Start at your neck, washing  your body down to your toes. Make sure you: Wash the part of your body where the surgery will be done for at least 1 minute. Do not scrub. Do not use CHG on your head or face unless your health care provider tells you to. If it gets into your ears or eyes, rinse them well with water . Do not wash your genitals with CHG. Wash your back and under your arms. Make sure to wash skin folds. Let the CHG sit on your skin for 1-2 minutes or as long as told. Rinse your entire body in the shower, including all body creases and folds. Turn off the shower. Dry off with a clean towel. Do not put anything on your skin afterward, such as powder, lotion, or perfume. Put on clean clothes or pajamas. If it's the night before surgery, sleep in clean sheets. General tips Use CHG only as told, and follow the instructions on the label. Use the full amount of CHG as told. This is often one bottle. Do not smoke and stay away from flames after using CHG. Your skin may feel sticky after using CHG. This is normal. The sticky feeling will go away as the CHG dries. Do not use CHG: If you have a chlorhexidine  allergy or have reacted to chlorhexidine  in the past. On open wounds or areas of skin that have broken skin, cuts, or scrapes. On babies younger than 57 months of age. Contact a health care provider if: You have questions about using CHG. Your skin gets irritated or itchy. You have a rash after using CHG. You swallow any CHG. Call your local poison control center [phone] in the [place] Your eyes itch  badly, or they become very red or swollen. Your hearing changes. You have trouble seeing. If you can't reach your provider, go to an urgent care or emergency room. Do not drive yourself. Get help right away if: You have swelling or tingling in your mouth or throat. You make high-pitched whistling sounds when you breathe, most often when you breathe out (wheeze). You have trouble breathing. These symptoms may be an emergency. Call 911 right away. Do not wait to see if the symptoms will go away. Do not drive yourself to the hospital. This information is not intended to replace advice given to you by your health care provider. Make sure you discuss any questions you have with your health care provider. Document Revised: 07/21/2022 Document Reviewed: 07/17/2021 Elsevier Patient Education  2024 [employer].How to Use an Incentive Spirometer An incentive spirometer is a tool that measures how well you are filling your lungs with each breath. Learning to take long, deep breaths using this tool can help you keep your lungs clear and active. This may help to reverse or lessen your chance of developing breathing (pulmonary) problems, especially infection. You may be asked to use a spirometer: After a surgery. If you have a lung problem or a history of smoking. After a long period of time when you have been unable to move or be active. If the spirometer includes an indicator to show the highest number that you have reached, your health care provider or respiratory therapist will help you set a goal. Keep a log of your progress as told by your health care provider. What are the risks? Breathing too quickly may cause dizziness or cause you to pass out. Take your time so you do not get dizzy or light-headed. If you are in pain, you may need to take pain  medicine before doing incentive spirometry. It is harder to take a deep breath if you are having pain. How to use your incentive spirometer  Sit up on  the edge of your bed or on a chair. Hold the incentive spirometer so that it is in an upright position. Before you use the spirometer, breathe out normally. Place the mouthpiece in your mouth. Make sure your lips are closed tightly around it. Breathe in slowly and as deeply as you can through your mouth, causing the piston or the ball to rise toward the top of the chamber. Hold your breath for 3-5 seconds, or for as long as possible. If the spirometer includes a coach indicator, use this to guide you in breathing. Slow down your breathing if the indicator goes above the marked areas. Remove the mouthpiece from your mouth and breathe out normally. The piston or ball will return to the bottom of the chamber. Rest for a few seconds, then repeat the steps 10 or more times. Take your time and take a few normal breaths between deep breaths so that you do not get dizzy or light-headed. Do this every 1-2 hours when you are awake. If the spirometer includes a goal marker to show the highest number you have reached (best effort), use this as a goal to work toward during each repetition. After each set of 10 deep breaths, cough a few times. This will help to make sure that your lungs are clear. If you have an incision on your chest or abdomen from surgery, place a pillow or a rolled-up towel firmly against the incision when you cough. This can help to reduce pain while taking deep breaths and coughing. General tips When you are able to get out of bed: Walk around often. Continue to take deep breaths and cough in order to clear your lungs. Keep using the incentive spirometer until your health care provider says it is okay to stop using it. If you have been in the hospital, you may be told to keep using the spirometer at home. Contact a health care provider if: You are having difficulty using the spirometer. You have trouble using the spirometer as often as instructed. Your pain medicine is not giving  enough relief for you to use the spirometer as told. You have a fever. Get help right away if: You develop shortness of breath. You develop a cough with bloody mucus from the lungs. You have fluid or blood coming from an incision site after you cough. Summary An incentive spirometer is a tool that can help you learn to take long, deep breaths to keep your lungs clear and active. You may be asked to use a spirometer after a surgery, if you have a lung problem or a history of smoking, or if you have been inactive for a long period of time. Use your incentive spirometer as instructed every 1-2 hours while you are awake. If you have an incision on your chest or abdomen, place a pillow or a rolled-up towel firmly against your incision when you cough. This will help to reduce pain. Get help right away if you have shortness of breath, you cough up bloody mucus, or blood comes from your incision when you cough. This information is not intended to replace advice given to you by your health care provider. Make sure you discuss any questions you have with your health care provider. Document Revised: 11/13/2022 Document Reviewed: 11/13/2022 Elsevier Patient Education  2024 [employer].

## 2023-12-03 ENCOUNTER — Encounter (HOSPITAL_COMMUNITY): Payer: Self-pay

## 2023-12-03 ENCOUNTER — Other Ambulatory Visit: Payer: Self-pay

## 2023-12-03 ENCOUNTER — Encounter (HOSPITAL_COMMUNITY)
Admission: RE | Admit: 2023-12-03 | Discharge: 2023-12-03 | Disposition: A | Discharge status: Home / Self Care | Source: Ambulatory Visit | Attending: Orthopedic Surgery | Admitting: Orthopedic Surgery

## 2023-12-03 VITALS — BP 125/70 | HR 62 | Temp 97.8°F | Resp 18 | Ht 66.0 in | Wt 192.6 lb

## 2023-12-03 DIAGNOSIS — Z01818 Encounter for other preprocedural examination: Secondary | ICD-10-CM | POA: Insufficient documentation

## 2023-12-03 DIAGNOSIS — I10 Essential (primary) hypertension: Secondary | ICD-10-CM | POA: Diagnosis not present

## 2023-12-03 DIAGNOSIS — Z95810 Presence of automatic (implantable) cardiac defibrillator: Secondary | ICD-10-CM

## 2023-12-03 DIAGNOSIS — G5602 Carpal tunnel syndrome, left upper limb: Secondary | ICD-10-CM | POA: Insufficient documentation

## 2023-12-03 LAB — CBC WITH DIFFERENTIAL/PLATELET
Abs Immature Granulocytes: 0.02 K/uL (ref 0.00–0.07)
Basophils Absolute: 0.1 K/uL (ref 0.0–0.1)
Basophils Relative: 1 %
Eosinophils Absolute: 0.1 K/uL (ref 0.0–0.5)
Eosinophils Relative: 1 %
HCT: 40.3 % (ref 36.0–46.0)
Hemoglobin: 12.7 g/dL (ref 12.0–15.0)
Immature Granulocytes: 0 %
Lymphocytes Relative: 37 %
Lymphs Abs: 3.1 K/uL (ref 0.7–4.0)
MCH: 25.4 pg — ABNORMAL LOW (ref 26.0–34.0)
MCHC: 31.5 g/dL (ref 30.0–36.0)
MCV: 80.6 fL (ref 80.0–100.0)
Monocytes Absolute: 0.6 K/uL (ref 0.1–1.0)
Monocytes Relative: 7 %
Neutro Abs: 4.5 K/uL (ref 1.7–7.7)
Neutrophils Relative %: 54 %
Platelets: 239 K/uL (ref 150–400)
RBC: 5 MIL/uL (ref 3.87–5.11)
RDW: 13.3 % (ref 11.5–15.5)
WBC: 8.3 K/uL (ref 4.0–10.5)
nRBC: 0 % (ref 0.0–0.2)

## 2023-12-03 LAB — BASIC METABOLIC PANEL WITH GFR
Anion gap: 10 (ref 5–15)
BUN: 12 mg/dL (ref 8–23)
CO2: 27 mmol/L (ref 22–32)
Calcium: 9.3 mg/dL (ref 8.9–10.3)
Chloride: 105 mmol/L (ref 98–111)
Creatinine, Ser: 0.83 mg/dL (ref 0.44–1.00)
GFR, Estimated: >60 mL/min (ref >60)
Glucose, Bld: 215 mg/dL — ABNORMAL HIGH (ref 70–99)
Potassium: 3 mmol/L — ABNORMAL LOW (ref 3.5–5.1)
Sodium: 142 mmol/L (ref 135–145)

## 2023-12-06 ENCOUNTER — Ambulatory Visit: Attending: Cardiovascular Disease

## 2023-12-06 DIAGNOSIS — Z95810 Presence of automatic (implantable) cardiac defibrillator: Secondary | ICD-10-CM

## 2023-12-06 DIAGNOSIS — I5042 Chronic combined systolic (congestive) and diastolic (congestive) heart failure: Secondary | ICD-10-CM | POA: Diagnosis not present

## 2023-12-06 NOTE — H&P (Signed)
 Office Visit Note              Patient: Diana Campbell                                   Date of Birth: 12/17/1945                                                   MRN: [id number] Visit Date: 11/18/2023 Requested by: Leonce Lucie PARAS, PA-C [address] [place],  [place] [address] PCP: Leonce Lucie PARAS, PA-C   Subjective:     Chief Complaint  Patient presents with   Hand Pain      L wrist and hand, numbness in the hand for over 2 mos.       HPI: 78 year old female who has a defibrillator stable heart rate at present presents with 55-month history of pain associated with numbness and tingling of all 5 digits of the left hand.  She cannot tolerate wearing a brace it made the symptoms worse   She also complains of inability to hold onto things she has trouble pricking her hand for her diabetic glucose checks   She has weakness on grip                                                                     ROS: Normal   Assessment & Plan:   Images personally read and my interpretation :  no images   Visit Diagnoses:  1. Carpal tunnel syndrome, left upper limb       Plan: left carpal tunnel release       PLAN:    Surgical release of the left carpal tunnel   The patient is well aware of the risks and benefits of carpal tunnel release surgery and the postoperative outcome as she had a carpal tunnel release of the right wrist back in 2017   The procedure has been fully reviewed with the patient; The risks and benefits of surgery have been discussed and explained and understood. Alternative treatment has also been reviewed, questions were encouraged and answered. The postoperative plan is also been reviewed.         Follow-Up Instructions: No follow-ups on file.    Orders:  No orders of the defined types were placed in this encounter.         Objective: Vital Signs: BP 121/64   Pulse 62      Allergies  No Known Allergies  '       Current  Outpatient Medications  Medication Instructions   ACCU-CHEK FASTCLIX LANCETS MISC 1 each, Does not apply, 2 times daily   ACCU-CHEK GUIDE test strip USE AS INSTRUCTED TO TEST BLOOD GLUCOSE FOUR TIMES DAILY   acetaminophen  (TYLENOL ) 650 mg, 2 times daily   aprepitant (EMEND) 125 MG capsule Daily   aspirin  EC 81 mg, Oral, Daily   atorvastatin  (LIPITOR) 20 mg, Oral, Daily   Blood Glucose Monitoring Suppl (ACCU-CHEK GUIDE)  w/Device KIT Use as directed to check blood glucose four times daily   carvedilol  (COREG ) 3.125 mg, Oral, 2 times daily   Continuous Glucose Receiver (FREESTYLE LIBRE 3 READER) DEVI 1 Piece, Does not apply, Once PRN   Continuous Glucose Sensor (FREESTYLE LIBRE 3 PLUS SENSOR) MISC Change sensor every 15 days.   donepezil (ARICEPT) 5 mg, Daily   Insulin  Asp Prot & Asp FlexPen (NOVOLOG  70/30 MIX) (70-30) 100 UNIT/ML FlexPen Inject 10 units with breakfast and 10 units with supper as directed by provider.   Jardiance  10 mg, Oral, Daily   Lancets Misc. (ACCU-CHEK FASTCLIX LANCET) KIT 1 each, Does not apply, 2 times daily   pantoprazole  (PROTONIX ) 20 mg, Oral, Daily   potassium chloride  SA (KLOR-CON  M) 20 MEQ tablet 20 mEq, Oral, Daily with lunch   sacubitril -valsartan  (ENTRESTO ) 49-51 MG 1 tablet, Oral, 2 times daily   spironolactone  (ALDACTONE ) 12.5 mg, Oral, Daily      Review of Systems Review of Systems Currently no chest pain arrhythmias shortness of breath or other joint maladies       Past Medical History:  Diagnosis Date   Arthritis     Automatic implantable cardioverter-defibrillator in situ 07/2010   Cardiac abnormality      with defibrilator   Chronic combined systolic and diastolic CHF (congestive heart failure) (HCC) 09/04/2015   Diabetes mellitus     H/O cardiac arrest 2006   Hypercholesteremia     Hypertension     Neuropathy                 Past Surgical History:  Procedure Laterality Date   CARDIAC CATHETERIZATION   06/22/2003    Severe nonischemic  cardiomyopathy, 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   CARDIAC CATHETERIZATION   04/14/2000    Normal coronary arteries.   CARDIAC DEFIBRILLATOR PLACEMENT   07/21/2010    Medtronic Protecta XT DR, model#[id number], serial#[id number] h   CARDIOVASCULAR STRESS TEST   04/05/2000    Scintigraphic evidence of mild LV dilatation and global LV dysfunction with an EF 41% with mild inferolateral ischemia.   CARPAL TUNNEL RELEASE Right 01/23/2016    Procedure: CARPAL TUNNEL RELEASE;  Surgeon: Taft FORBES Minerva, MD;  Location: [facility];  Service: Orthopedics;  Laterality: Right;  pt knows to arrive at 6:15   COLONOSCOPY   03/30/2011    Procedure: COLONOSCOPY;  Surgeon: Oneil DELENA Budge, MD;  Location: [facility];  Service: Gastroenterology;  Laterality: N/A;   DORSAL COMPARTMENT RELEASE Right 08/11/2013    Procedure: RELEASE DORSAL COMPARTMENT (DEQUERVAIN);  Surgeon: Taft FORBES Minerva, MD;  Location: [facility];  Service: Orthopedics;  Laterality: Right;   ICD GENERATOR CHANGEOUT N/A 10/29/2017    Procedure: ICD GENERATOR CHANGEOUT;  Surgeon: Francyne Headland, MD;  Location: [facility];  Service: Cardiovascular;  Laterality: N/A;   TRANSTHORACIC ECHOCARDIOGRAM   08/03/2011    EF 45-50%, mildly reduced LV systolic function.          History reviewed. No pertinent family history.         Social History  Social History         Tobacco Use   Smoking status: Never   Smokeless tobacco: Current      Types: Snuff   Tobacco comments:      3 dips per day  Vaping Use   Vaping status: Never Used  Substance Use Topics   Alcohol use: No  Drug use: No        Allergies  No Known Allergies           Current Outpatient Medications  Medication Sig Dispense Refill   ACCU-CHEK FASTCLIX LANCETS MISC 1 each by Does not apply route 2 (two) times daily. 200 each 5   ACCU-CHEK GUIDE test strip USE AS INSTRUCTED TO TEST  BLOOD GLUCOSE FOUR TIMES DAILY 400 strip 1   acetaminophen  (TYLENOL ) 325 MG tablet Take 650 mg by mouth in the morning and at bedtime.       aprepitant (EMEND) 125 MG capsule Take by mouth daily.       aspirin  EC 81 MG tablet Take 1 tablet (81 mg total) by mouth daily. 90 tablet 3   atorvastatin  (LIPITOR) 20 MG tablet Take 1 tablet (20 mg total) by mouth daily. 90 tablet 2   Blood Glucose Monitoring Suppl (ACCU-CHEK GUIDE) w/Device KIT Use as directed to check blood glucose four times daily 1 kit 0   carvedilol  (COREG ) 3.125 MG tablet Take 1 tablet (3.125 mg total) by mouth 2 (two) times daily. 180 tablet 3   Continuous Glucose Receiver (FREESTYLE LIBRE 3 READER) DEVI 1 Piece by Does not apply route once as needed for up to 1 dose. 1 each 0   Continuous Glucose Sensor (FREESTYLE LIBRE 3 PLUS SENSOR) MISC Change sensor every 15 days. 2 each 2   donepezil (ARICEPT) 5 MG tablet Take 5 mg by mouth daily.       Insulin  Asp Prot & Asp FlexPen (NOVOLOG  70/30 MIX) (70-30) 100 UNIT/ML FlexPen Inject 10 units with breakfast and 10 units with supper as directed by provider. 30 mL 0   JARDIANCE  10 MG TABS tablet TAKE 1 TABLET BY MOUTH EVERY DAY 90 tablet 3   Lancets Misc. (ACCU-CHEK FASTCLIX LANCET) KIT 1 each by Does not apply route 2 (two) times daily. 1 kit 2   pantoprazole  (PROTONIX ) 20 MG tablet Take 1 tablet (20 mg total) by mouth daily. 14 tablet 0   potassium chloride  SA (KLOR-CON  M) 20 MEQ tablet Take 1 tablet (20 mEq total) by mouth daily with lunch. 9 tablet 1   sacubitril -valsartan  (ENTRESTO ) 49-51 MG TAKE 1 TABLET BY MOUTH TWICE A DAY 180 tablet 3   spironolactone  (ALDACTONE ) 25 MG tablet TAKE 1/2 TABLET BY MOUTH EVERY DAY 45 tablet 11      No current facility-administered medications for this visit.          Physical Exam BP 121/64   Pulse 62    The patient is well developed well nourished and well groomed.  Orientation to person place and time is normal  Mood is pleasant.    Ambulatory status normal gait and stance    Left upper extremity examination reveals the following:   SWELLING no TENDERNESS OVER THE CARPAL TUNNEL yes this Range of motion of the wrist normal   Motor exam normal    Wrist joint is stable   Provocative tests for carpal tunnel Phalen's test it if Carpal tunnel compression test positive Tinel's test negative   Pulses are normal in the radial and ulnar artery with a normal Allen's test.   SENSORY EXAM: All 5 digits involved sensory loss   Opposite extremity noncontributory

## 2023-12-07 ENCOUNTER — Ambulatory Visit (HOSPITAL_COMMUNITY): Admitting: Anesthesiology

## 2023-12-07 ENCOUNTER — Encounter (HOSPITAL_COMMUNITY): Payer: Self-pay | Admitting: Orthopedic Surgery

## 2023-12-07 ENCOUNTER — Ambulatory Visit (HOSPITAL_COMMUNITY)
Admission: RE | Admit: 2023-12-07 | Discharge: 2023-12-07 | Disposition: A | Discharge status: Home / Self Care | Attending: Orthopedic Surgery | Admitting: Orthopedic Surgery

## 2023-12-07 ENCOUNTER — Encounter (HOSPITAL_COMMUNITY)
Admission: RE | Disposition: A | Discharge status: Home / Self Care | Payer: Self-pay | Source: Home / Self Care | Attending: Orthopedic Surgery

## 2023-12-07 ENCOUNTER — Other Ambulatory Visit: Payer: Self-pay

## 2023-12-07 DIAGNOSIS — G5602 Carpal tunnel syndrome, left upper limb: Secondary | ICD-10-CM | POA: Insufficient documentation

## 2023-12-07 DIAGNOSIS — E119 Type 2 diabetes mellitus without complications: Secondary | ICD-10-CM | POA: Insufficient documentation

## 2023-12-07 DIAGNOSIS — Z7984 Long term (current) use of oral hypoglycemic drugs: Secondary | ICD-10-CM | POA: Insufficient documentation

## 2023-12-07 DIAGNOSIS — Z95810 Presence of automatic (implantable) cardiac defibrillator: Secondary | ICD-10-CM | POA: Diagnosis not present

## 2023-12-07 DIAGNOSIS — F17220 Nicotine dependence, chewing tobacco, uncomplicated: Secondary | ICD-10-CM | POA: Diagnosis not present

## 2023-12-07 DIAGNOSIS — I2510 Atherosclerotic heart disease of native coronary artery without angina pectoris: Secondary | ICD-10-CM | POA: Diagnosis not present

## 2023-12-07 DIAGNOSIS — I110 Hypertensive heart disease with heart failure: Secondary | ICD-10-CM

## 2023-12-07 DIAGNOSIS — I5042 Chronic combined systolic (congestive) and diastolic (congestive) heart failure: Secondary | ICD-10-CM | POA: Insufficient documentation

## 2023-12-07 DIAGNOSIS — Z794 Long term (current) use of insulin: Secondary | ICD-10-CM | POA: Diagnosis not present

## 2023-12-07 HISTORY — PX: CARPAL TUNNEL RELEASE: SHX101

## 2023-12-07 LAB — GLUCOSE, CAPILLARY: Glucose-Capillary: 121 mg/dL — ABNORMAL HIGH (ref 70–99)

## 2023-12-07 SURGERY — CARPAL TUNNEL RELEASE
Anesthesia: Monitor Anesthesia Care | Site: Hand | Laterality: Left

## 2023-12-07 MED ORDER — CEFAZOLIN SODIUM-DEXTROSE 2-4 GM/100ML-% IV SOLN
INTRAVENOUS | Status: AC
  Filled 2023-12-07: qty 100

## 2023-12-07 MED ORDER — OXYCODONE HCL 5 MG/5ML PO SOLN: 5.0000 mg | Freq: Once | ORAL | Status: DC | PRN

## 2023-12-07 MED ORDER — ACETAMINOPHEN-CODEINE 300-30 MG PO TABS
1.0000 | ORAL_TABLET | Freq: Four times a day (QID) | ORAL | 0 refills | Status: AC | PRN
Start: 2023-12-07 — End: 2023-12-10

## 2023-12-07 MED ORDER — CHLORHEXIDINE GLUCONATE 0.12 % MT SOLN
15.0000 mL | Freq: Once | OROMUCOSAL | Status: AC
  Administered 2023-12-07: 15 mL via OROMUCOSAL

## 2023-12-07 MED ORDER — LIDOCAINE 2% (20 MG/ML) 5 ML SYRINGE
INTRAMUSCULAR | Status: DC | PRN
  Administered 2023-12-07: 60 mg via INTRAVENOUS

## 2023-12-07 MED ORDER — PROPOFOL 500 MG/50ML IV EMUL
INTRAVENOUS | Status: DC | PRN
  Administered 2023-12-07: 100 ug/kg/min via INTRAVENOUS

## 2023-12-07 MED ORDER — LACTATED RINGERS IV SOLN: INTRAVENOUS | Status: DC

## 2023-12-07 MED ORDER — BUPIVACAINE HCL (PF) 0.5 % IJ SOLN
INTRAMUSCULAR | Status: DC | PRN
  Administered 2023-12-07: 10 mL

## 2023-12-07 MED ORDER — BUPIVACAINE HCL (PF) 0.5 % IJ SOLN
INTRAMUSCULAR | Status: AC
  Filled 2023-12-07: qty 30

## 2023-12-07 MED ORDER — FENTANYL CITRATE (PF) 50 MCG/ML IJ SOSY: 25.0000 ug | PREFILLED_SYRINGE | INTRAMUSCULAR | Status: DC | PRN

## 2023-12-07 MED ORDER — 0.9 % SODIUM CHLORIDE (POUR BTL) OPTIME
TOPICAL | Status: DC | PRN
  Administered 2023-12-07: 1000 mL

## 2023-12-07 MED ORDER — OXYCODONE HCL 5 MG PO TABS: 5.0000 mg | ORAL_TABLET | Freq: Once | ORAL | Status: DC | PRN

## 2023-12-07 MED ORDER — LIDOCAINE HCL (PF) 1 % IJ SOLN
INTRAMUSCULAR | Status: AC
Start: 2023-12-07 — End: 2023-12-07
  Filled 2023-12-07: qty 30

## 2023-12-07 MED ORDER — CEFAZOLIN SODIUM-DEXTROSE 2-4 GM/100ML-% IV SOLN
2.0000 g | INTRAVENOUS | Status: AC
  Administered 2023-12-07: 2 g via INTRAVENOUS

## 2023-12-07 MED ORDER — FENTANYL CITRATE (PF) 100 MCG/2ML IJ SOLN
INTRAMUSCULAR | Status: DC | PRN
  Administered 2023-12-07: 25 ug via INTRAVENOUS

## 2023-12-07 MED ORDER — FENTANYL CITRATE (PF) 100 MCG/2ML IJ SOLN
INTRAMUSCULAR | Status: AC
  Filled 2023-12-07: qty 2

## 2023-12-07 SURGICAL SUPPLY — 31 items
BANDAGE ESMARK 4X12 BL STRL LF (DISPOSABLE) ×1 IMPLANT
BLADE SURG 15 STRL LF DISP TIS (BLADE) ×1 IMPLANT
BNDG ELASTIC 2X5.8 VLCR NS LF (GAUZE/BANDAGES/DRESSINGS) IMPLANT
BNDG ELASTIC 3X5.8 VLCR NS LF (GAUZE/BANDAGES/DRESSINGS) ×1 IMPLANT
BNDG GAUZE DERMACEA FLUFF 4 (GAUZE/BANDAGES/DRESSINGS) ×1 IMPLANT
CHLORAPREP W/TINT 26 (MISCELLANEOUS) ×1 IMPLANT
CLOTH BEACON ORANGE TIMEOUT ST (SAFETY) ×1 IMPLANT
COVER LIGHT HANDLE STERIS (MISCELLANEOUS) ×2 IMPLANT
CUFF TOURN SGL QUICK 18X4 (TOURNIQUET CUFF) ×1 IMPLANT
DRAPE HALF SHEET 40X57 (DRAPES) ×1 IMPLANT
ELECT NDL TIP 2.8 STRL (NEEDLE) IMPLANT
ELECT NEEDLE TIP 2.8 STRL (NEEDLE) ×1 IMPLANT
ELECTRODE REM PT RTRN 9FT ADLT (ELECTROSURGICAL) ×1 IMPLANT
GAUZE SPONGE 4X4 12PLY STRL (GAUZE/BANDAGES/DRESSINGS) ×1 IMPLANT
GAUZE XEROFORM 1X8 LF (GAUZE/BANDAGES/DRESSINGS) ×1 IMPLANT
GLOVE BIOGEL PI IND STRL 7.0 (GLOVE) ×2 IMPLANT
GLOVE BIOGEL PI IND STRL 8.5 (GLOVE) ×1 IMPLANT
GLOVE SKINSENSE STRL SZ8.0 LF (GLOVE) ×1 IMPLANT
GOWN STRL REUS W/TWL LRG LVL3 (GOWN DISPOSABLE) ×1 IMPLANT
GOWN STRL REUS W/TWL XL LVL3 (GOWN DISPOSABLE) ×1 IMPLANT
KIT TURNOVER KIT A (KITS) ×1 IMPLANT
MANIFOLD NEPTUNE II (INSTRUMENTS) ×1 IMPLANT
NDL HYPO 21X1.5 SAFETY (NEEDLE) ×1 IMPLANT
NEEDLE HYPO 21X1.5 SAFETY (NEEDLE) ×1 IMPLANT
PACK BASIC LIMB (CUSTOM PROCEDURE TRAY) ×1 IMPLANT
PAD ARMBOARD POSITIONER FOAM (MISCELLANEOUS) ×1 IMPLANT
POSITIONER HAND ALUMI XLG (MISCELLANEOUS) ×1 IMPLANT
SET BASIN LINEN APH (SET/KITS/TRAYS/PACK) ×1 IMPLANT
SOLN 0.9% NACL POUR BTL 1000ML (IV SOLUTION) ×1 IMPLANT
SUT 3-0 BLK 1X30 PSL (SUTURE) ×1 IMPLANT
SYR CONTROL 10ML LL (SYRINGE) ×1 IMPLANT

## 2023-12-07 NOTE — Op Note (Signed)
 Operative report  Carpal tunnel release left wrist   Indications pain and paresthesias left upper extremity failed conservative management  Preop diagnosis carpal tunnel syndrome left wrist  Postop diagnosis same  Procedure open carpal tunnel release left wrist  Surgeon Margrette  There were no assistants  General anesthesia with sedation  Findings extensive synovitis and compression of the median nerve Color gray Shape apple core  Details of procedure The patient was identified in the preop area the site was confirmed and marked the chart was reviewed  The patient was taken to the operating room where she had sedation IV for general anesthesia  The left upper extremities prepped and draped sterilely  Timeout was completed  The incision was made over the carpal tunnel in line with the radial border of the ring finger at the subcutaneous tissue was divided the distal aspect of the carpal tunnel was identified and blunt dissection was carried out beneath the transverse carpal ligament which was then divided sharply  The nerve was inspected the findings are noted above there were no other space-occupying lesions the wound was irrigated and closed with interrupted horizontal mattress sutures using 3-0 nylon  We injected each side of the wound with 5 cc of Marcaine   The tourniquet was released and a sterile dressing was applied  Good color capillary refill return to the fingertips  The patient was taken to the recovery room in stable condition  Postop plan sutures out in 15 days  She can start immediate range of motion exercises Remove dressing In in 2 days  Use a glove to take a shower

## 2023-12-07 NOTE — Interval H&P Note (Signed)
 History and Physical Interval Note:  12/07/2023 7:34 AM  Diana Campbell  has presented today for surgery, with the diagnosis of left carpal tunnel syndrome.  The various methods of treatment have been discussed with the patient and family. After consideration of risks, benefits and other options for treatment, the patient has consented to  Procedure(s): CARPAL TUNNEL RELEASE (Left) as a surgical intervention.  The patient's history has been reviewed, patient examined, no change in status, stable for surgery.  I have reviewed the patient's chart and labs.  Questions were answered to the patient's satisfaction.     Taft Minerva

## 2023-12-07 NOTE — Anesthesia Postprocedure Evaluation (Signed)
 Anesthesia Post Note  Patient: Diana Campbell  Procedure(s) Performed: CARPAL TUNNEL RELEASE (Left: Hand)  Patient location during evaluation: PACU Anesthesia Type: MAC Level of consciousness: awake and alert Pain management: pain level controlled Vital Signs Assessment: post-procedure vital signs reviewed and stable Respiratory status: spontaneous breathing, nonlabored ventilation and respiratory function stable Cardiovascular status: blood pressure returned to baseline and stable Postop Assessment: no apparent nausea or vomiting Anesthetic complications: no   There were no known notable events for this encounter.   Last Vitals:  Vitals:   12/07/23 0852 12/07/23 0855  BP:  99/69  Pulse: 60 (!) 59  Resp: 14 12  Temp:    SpO2: 99% 98%    Last Pain:  Vitals:   12/07/23 0852  TempSrc: Oral  PainSc: 0-No pain                 Clorene Nerio L Auri Jahnke

## 2023-12-07 NOTE — Transfer of Care (Signed)
 Immediate Anesthesia Transfer of Care Note  Patient: Diana Campbell  Procedure(s) Performed: CARPAL TUNNEL RELEASE (Left: Hand)  Patient Location: PACU  Anesthesia Type:General  Level of Consciousness: awake  Airway & Oxygen Therapy: Patient Spontanous Breathing  Post-op Assessment: Report given to RN  Post vital signs: Reviewed and stable  Last Vitals:  Vitals Value Taken Time  BP 123/70 12/07/23 08:26  Temp    Pulse 60 12/07/23 08:28  Resp 16 12/07/23 08:28  SpO2 97 % 12/07/23 08:28  Vitals shown include unfiled device data.  Last Pain:  Vitals:   12/07/23 0728  TempSrc: Oral  PainSc: 0-No pain      Patients Stated Pain Goal: 8 (12/07/23 0728)  Complications: No notable events documented.

## 2023-12-07 NOTE — Anesthesia Preprocedure Evaluation (Addendum)
 Anesthesia Evaluation  Patient identified by MRN, date of birth, ID band Patient awake    Reviewed: Allergy & Precautions, H&P , NPO status , Patient's Chart, lab work & pertinent test results, reviewed documented beta blocker date and time   Airway Mallampati: III  TM Distance: >3 FB Neck ROM: Full    Dental  (+) Implants, Dental Advisory Given, Poor Dentition, Missing,    Pulmonary sleep apnea    Pulmonary exam normal breath sounds clear to auscultation       Cardiovascular hypertension, Pt. on medications and Pt. on home beta blockers + CAD and +CHF  Normal cardiovascular exam+ Cardiac Defibrillator  Rhythm:Regular Rate:Normal  EF 35%. Grade 1 diastolic dysfunction   Neuro/Psych    GI/Hepatic ,neg GERD  ,,  Endo/Other  diabetes, Type 2, Insulin  Dependent, Oral Hypoglycemic Agents    Renal/GU      Musculoskeletal   Abdominal   Peds  Hematology   Anesthesia Other Findings   Reproductive/Obstetrics                              Anesthesia Physical Anesthesia Plan  ASA: 4  Anesthesia Plan: MAC   Post-op Pain Management: Minimal or no pain anticipated   Induction: Intravenous  PONV Risk Score and Plan: Propofol  infusion  Airway Management Planned: Nasal Cannula and Natural Airway  Additional Equipment: None  Intra-op Plan:   Post-operative Plan:   Informed Consent: I have reviewed the patients History and Physical, chart, labs and discussed the procedure including the risks, benefits and alternatives for the proposed anesthesia with the patient or authorized representative who has indicated his/her understanding and acceptance.     Dental advisory given  Plan Discussed with: CRNA and Surgeon  Anesthesia Plan Comments: (ICD. We will put a magnet on it for the procedure)         Anesthesia Quick Evaluation

## 2023-12-08 ENCOUNTER — Telehealth: Payer: Self-pay

## 2023-12-08 ENCOUNTER — Encounter (HOSPITAL_COMMUNITY): Payer: Self-pay | Admitting: Orthopedic Surgery

## 2023-12-08 NOTE — Telephone Encounter (Signed)
 Remote ICM transmission received.  Attempted call to patient regarding ICM remote transmission and no answer.

## 2023-12-08 NOTE — Progress Notes (Signed)
 EPIC Encounter for ICM Monitoring  Patient Name: Diana Campbell is a 78 y.o. female Date: 12/08/2023 Primary Care Physican: Patient, No Pcp Per Primary Cardiologist: Burnard Electrophysiologist: Croitoru 12/23/2022 Office Weight: 202 lbs 03/08/2023 Office Weight: 200 lbs 05/25/2023 Weight: 200 lbs at home 06/10/2023 Office Weight: 187 lbs 08/11/2023 Office Weight: 190 lbs 11/23/2023 Office Weight: 192 lbs   Clinical Status Since 01-Nov-2023 Time in AT/AF      0.0 hr/day (0.0%)                                                  Attempted call to patient and unable to reach.    Transmission results reviewed.    Since 11/01/2023 ICM Remote Transmission: [employer] thoracic impedance suggesting normal fluid levels.   Prescribed:  No diuretic (Previously on Furosemide  40 mg 1 tablet as needed) Potassium 20 mEq take 1 tablet by mouth at lunch daily Spironolactone  25 mg take 0.5 tablet (12.5 mg total) by mouth every other day.   Labs: 12/03/2023 Creatinine 0.83, BUN 12, Potassium 3.0, Sodium 142, GFR >60 11/08/2023 Creatinine 1.05, BUN 15, Potassium 3.6, Sodium 144 06/29/2023 Creatinine 0.98, BUN 11, Potassium 3.7, Sodium 143, GFR 59 03/02/2023 Creatinine 0.87, BUN 10, Potassium 3.4, Sodium 146, GFR 68 A complete set of results can be found in Results Review.   Recommendations:  Unable to reach.     Follow-up plan: ICM clinic phone appointment on 01/17/2024.   91 day device clinic remote transmission 01/26/2024.     EP/Cardiology Office Visits:  Recall 06/09/2024 with Dr Francyne.     12/21/2023 with Dr Darron (yearly).   Copy of ICM check sent to Dr. Francyne.    Remote monitoring is medically necessary for Heart Failure Management.    Daily Thoracic Impedance ICM trend: 09/06/2023 through 12/06/2023.    12-14 Month Thoracic Impedance ICM trend:     [name], RN 12/08/2023 8:42 AM

## 2023-12-14 ENCOUNTER — Encounter: Payer: Self-pay | Admitting: Orthopedic Surgery

## 2023-12-14 ENCOUNTER — Ambulatory Visit: Admitting: Orthopedic Surgery

## 2023-12-14 DIAGNOSIS — G5602 Carpal tunnel syndrome, left upper limb: Secondary | ICD-10-CM

## 2023-12-14 NOTE — Progress Notes (Signed)
 Orthopaedic Postop Note  Assessment: Diana Campbell is a 78 y.o. female s/p left open carpal tunnel release (Dr. Margrette)  DOS: 12/07/2023  Plan: Evaluated her left hand, and there is some swelling, localized to the thumb.  I think this is a result of the Ace wrap being on too tight.  There is some skin irritation in the first webspace, also consistent with an Ace wrap on too tight.  Provided reassurance.  Sutures to remain.  I have recommended covering the incision with a Band-Aid.  This was done in clinic today.  Ace wrap as needed.  If she continues to have pain and swelling, can consider a prednisone Dosepak.  She will follow-up with Dr. Margrette as previously planned.  Follow-up: Return Return to see Dr. Margrette as previously scheduled. XR at next visit: None  Subjective:  Chief Complaint  Patient presents with   Follow-up    CTR left hand surgery nov 18, having swelling at the thumb area    History of Present Illness: Diana Campbell is a 78 y.o. female who presents following the above stated procedure.  She had surgery with Dr. Margrette last week.  Left open carpal tunnel release.  She noted some swelling in the left hand.  She was concerned.  She returns to clinic earlier than scheduled.  She took the pain medicine that was provided, but she did not like the way it made her feel.  No fevers or chills.  She does have a history of right open carpal tunnel release, and the left hand was not responding the same as the right.  Review of Systems: No fevers or chills + numbness or tingling No Chest Pain No shortness of breath   Objective: There were no vitals taken for this visit.  Physical Exam:  Surgical incision is healing well.  Sutures are intact.  No surrounding erythema or drainage.  There is some tenderness in line with the incision.  Decreased sensation within the median nerve distribution.  Within the first webspace, there is irritation, with some pressure sores  from the Ace wrap.  In addition, there is swelling around the left thumb, especially on the dorsal aspect, with an outline consistent with the Ace wrap.  Dorsal aspect of the fingers are also diffusely swollen.  She is having some problems making a full fist.  IMAGING: I personally ordered and reviewed the following images:   Oneil DELENA Horde, MD 12/14/2023 1:35 PM

## 2023-12-21 ENCOUNTER — Encounter: Payer: Self-pay | Admitting: Cardiovascular Disease

## 2023-12-21 ENCOUNTER — Ambulatory Visit: Attending: Cardiovascular Disease | Admitting: Cardiovascular Disease

## 2023-12-22 ENCOUNTER — Ambulatory Visit: Admitting: Orthopedic Surgery

## 2023-12-22 DIAGNOSIS — G5602 Carpal tunnel syndrome, left upper limb: Secondary | ICD-10-CM

## 2023-12-22 NOTE — Progress Notes (Signed)
    12/22/2023   Chief Complaint  Patient presents with   Routine Post Op    Left CTR DOS 12/07/23    Encounter Diagnosis  Name Primary?   Carpal tunnel syndrome, left upper limb s/p release 12/07/23 Yes    DOI/DOS/ Date: 12/07/23 -Left CTR  Postop day 15 carpal tunnel release left wrist  The patient had severe disease  She has pain in the palm of the hand which is intermittent runs through it intermittently she had a lot of swelling postoperatively which has since resolved but it did leave her with some stiffness in terms of making a full fist but that is improving  Thumb sensation is normal index finger and long finger tips are still numb  Ring finger and small finger normal  Incision looks good  Continue to work on range of motion return in 4 weeks  Expect long recovery to get full sensation back  Night pain resolved

## 2023-12-22 NOTE — Progress Notes (Signed)
    12/22/2023   Chief Complaint  Patient presents with   Routine Post Op    Left CTR DOS 12/07/23    Encounter Diagnosis  Name Primary?   Carpal tunnel syndrome, left upper limb s/p release 12/07/23 Yes    DOI/DOS/ Date: 12/07/23 -Left CTR

## 2023-12-31 ENCOUNTER — Encounter (HOSPITAL_COMMUNITY): Payer: Self-pay | Admitting: *Deleted

## 2023-12-31 ENCOUNTER — Ambulatory Visit (HOSPITAL_COMMUNITY)
Admission: EM | Admit: 2023-12-31 | Discharge: 2023-12-31 | Disposition: A | Discharge status: Home / Self Care | Attending: Internal Medicine | Admitting: Internal Medicine

## 2023-12-31 DIAGNOSIS — M62838 Other muscle spasm: Secondary | ICD-10-CM

## 2023-12-31 DIAGNOSIS — M25511 Pain in right shoulder: Secondary | ICD-10-CM | POA: Diagnosis not present

## 2023-12-31 MED ORDER — DEXAMETHASONE SOD PHOSPHATE PF 10 MG/ML IJ SOLN
5.0000 mg | Freq: Once | INTRAMUSCULAR | Status: AC
  Administered 2023-12-31: 5 mg via INTRAMUSCULAR

## 2023-12-31 NOTE — Discharge Instructions (Signed)
 We gave you an injection of steroid today which will help reduce inflammation to your shoulder/neck.  Continue tylenol  1,000mg  every 6 hours as needed for pain.  Apply heat 20 minutes on 20 minutes off to the right shoulder/neck and perform gentle exercises/stretches to the neck to relieve stiffness and pain.   Follow-up with the orthopedic provider listed on your paperwork if your pain does not improve in the next 3-4 days.   If you develop any new or worsening symptoms or if your symptoms do not start to improve, please return here or follow-up with your primary care provider. If your symptoms are severe, please go to the emergency room.

## 2023-12-31 NOTE — ED Provider Notes (Signed)
 [facility]    CSN: [id number] Arrival date & time: 12/31/23  1107      History   Chief Complaint Chief Complaint  Patient presents with   Shoulder Pain    HPI Diana Campbell is a 78 y.o. female.   Diana Campbell is a 78 y.o. female with past medical history of hypertension, diabetes, paroxysmal atrial fibrillation, and CHF presenting for chief complaint of right shoulder pain that started 2 days ago.  2 days before right shoulder/neck pain started, she was helping her son lift wood onto the porch to prepare for the snow.  She was able to move the wood without difficulty and did not experience immediate pain to the right neck.  She states the pain starts at the right neck and radiates into the right shoulder.  He does not go down the right arm and does not go into the right chest or head.  Pain is triggered by movement of the right upper extremity and improves with rest.  She has had a difficult time finding a position of comfort her sleep over the last 1-2 nights.  Denies paresthesias to the extremities, headache, ear pain, dizziness, and nausea/vomiting. Taking tylenol  without much relief of pain.    Shoulder Pain   Past Medical History:  Diagnosis Date   Arthritis    Automatic implantable cardioverter-defibrillator in situ 07/2010   Cardiac abnormality    with defibrilator   Chronic combined systolic and diastolic CHF (congestive heart failure) (HCC) 09/04/2015   Diabetes mellitus    H/O cardiac arrest 2006   Hypercholesteremia    Hypertension    Neuropathy     Patient Active Problem List   Diagnosis Date Noted   Carpal tunnel syndrome, left upper limb 12/07/2023   Hypokalemia 03/08/2023   Insulin  long-term use (HCC) 09/04/2022   Class 1 obesity due to excess calories with serious comorbidity and body mass index (BMI) of 32.0 to 32.9 in adult 01/01/2022   Type 2 diabetes mellitus without complication, with long-term current use of insulin  (HCC) 05/21/2021    VF (ventricular fibrillation) (HCC) 08/22/2018   PAF (paroxysmal atrial fibrillation) (HCC) 08/22/2018   UTI (urinary tract infection) 01/30/2018   Acute pyelonephritis 01/28/2018   Sepsis due to urinary tract infection (HCC) 01/28/2018   ICD (implantable cardioverter-defibrillator) in place    Carpal tunnel syndrome of right wrist    Chronic combined systolic and diastolic CHF (congestive heart failure) (HCC) 09/04/2015   Mixed hyperlipidemia 12/24/2014   Essential hypertension, benign 12/24/2014   Everitt Curt disease (radial styloid tenosynovitis) 08/11/2013   Witnessed recurrent apnea, suspected obstructive sleep apnea 03/22/2013   Cardiomyopathy, nonischemic (HCC) 12/29/2012   Uncontrolled type 2 diabetes mellitus with complication, with long-term current use of insulin  12/29/2012   Automatic implantable cardioverter-defibrillator in situ 12/29/2012   Ankle fracture 06/21/2012   Lateral malleolar fracture 04/19/2012    Past Surgical History:  Procedure Laterality Date   CARDIAC CATHETERIZATION  06/22/2003   Severe nonischemic cardiomyopathy, 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   CARDIAC CATHETERIZATION  04/14/2000   Normal coronary arteries.   CARDIAC DEFIBRILLATOR PLACEMENT  07/21/2010   Medtronic Protecta XT DR, model#[id number], serial#[id number]   CARDIOVASCULAR STRESS TEST  04/05/2000   Scintigraphic evidence of mild LV dilatation and global LV dysfunction with an EF 41% with mild inferolateral ischemia.   CARPAL TUNNEL RELEASE Right 01/23/2016   Procedure: CARPAL TUNNEL RELEASE;  Surgeon:  Taft FORBES Minerva, MD;  Location: [facility];  Service: Orthopedics;  Laterality: Right;  pt knows to arrive at 6:15   CARPAL TUNNEL RELEASE Left 12/07/2023   Procedure: CARPAL TUNNEL RELEASE;  Surgeon: Minerva Taft FORBES, MD;  Location: [facility];  Service: Orthopedics;  Laterality: Left;   COLONOSCOPY   03/30/2011   Procedure: COLONOSCOPY;  Surgeon: Oneil DELENA Budge, MD;  Location: [facility];  Service: Gastroenterology;  Laterality: N/A;   DORSAL COMPARTMENT RELEASE Right 08/11/2013   Procedure: RELEASE DORSAL COMPARTMENT (DEQUERVAIN);  Surgeon: Taft FORBES Minerva, MD;  Location: [facility];  Service: Orthopedics;  Laterality: Right;   ICD GENERATOR CHANGEOUT N/A 10/29/2017   Procedure: ICD GENERATOR CHANGEOUT;  Surgeon: Francyne Headland, MD;  Location: [facility];  Service: Cardiovascular;  Laterality: N/A;   TRANSTHORACIC ECHOCARDIOGRAM  08/03/2011   EF 45-50%, mildly reduced LV systolic function.    OB History   No obstetric history on file.      Home Medications    Prior to Admission medications  Medication Sig Start Date End Date Taking? Authorizing Provider  acetaminophen  (TYLENOL ) 325 MG tablet Take 650 mg by mouth 3 (three) times daily as needed (pain/headaches.).   Yes [provider]  aspirin  EC 81 MG tablet Take 1 tablet (81 mg total) by mouth daily. 08/22/18  Yes Kilroy, Luke K, PA-C  atorvastatin  (LIPITOR) 20 MG tablet Take 1 tablet (20 mg total) by mouth daily. Patient taking differently: Take 20 mg by mouth every evening. 09/17/23  Yes Croitoru, Mihai, MD  carvedilol  (COREG ) 3.125 MG tablet Take 1 tablet (3.125 mg total) by mouth 2 (two) times daily. 06/10/23 11/30/26 Yes Croitoru, Mihai, MD  donepezil (ARICEPT) 5 MG tablet Take 5 mg by mouth in the morning. 08/03/23  Yes [provider]  Insulin  Asp Prot & Asp FlexPen (NOVOLOG  70/30 MIX) (70-30) 100 UNIT/ML FlexPen Inject 15 units with breakfast and 15 units with supper as directed by provider. 11/23/23  Yes Nida, Ethelle ORN, MD  JARDIANCE  10 MG TABS tablet TAKE 1 TABLET BY MOUTH EVERY DAY 07/26/23  Yes Burnard Debby DELENA, MD  pantoprazole  (PROTONIX ) 20 MG tablet Take 1 tablet (20 mg total) by mouth daily. 10/22/23  Yes Raspet, Erin K, PA-C  pantoprazole  (PROTONIX ) 40 MG tablet Take 40 mg by mouth in the  morning and at bedtime.   Yes [provider]  sacubitril -valsartan  (ENTRESTO ) 49-51 MG TAKE 1 TABLET BY MOUTH TWICE A DAY 01/14/23  Yes Cindie Nest K, PA-C  ACCU-CHEK FASTCLIX LANCETS MISC 1 each by Does not apply route 2 (two) times daily. 09/07/17   Nida, Gebreselassie W, MD  ACCU-CHEK GUIDE test strip USE AS INSTRUCTED TO TEST BLOOD GLUCOSE FOUR TIMES DAILY 07/22/22   Nida, Gebreselassie W, MD  Blood Glucose Monitoring Suppl (ACCU-CHEK GUIDE) w/Device KIT Use as directed to check blood glucose four times daily 10/30/19   Nida, Gebreselassie W, MD  Continuous Glucose Receiver (FREESTYLE LIBRE 3 READER) DEVI 1 Piece by Does not apply route once as needed for up to 1 dose. 07/07/23   Nida, Gebreselassie W, MD  Continuous Glucose Sensor (FREESTYLE LIBRE 3 PLUS SENSOR) MISC Change sensor every 15 days. 07/07/23   Nida, Gebreselassie W, MD  Lancets Misc. (ACCU-CHEK FASTCLIX LANCET) KIT 1 each by Does not apply route 2 (two) times daily. 09/07/17   Lenis Ethelle ORN, MD    Family History History reviewed. No pertinent family history.  Social History Social History[1]   Allergies  Patient has no known allergies.   Review of Systems Review of Systems Per HPI  Physical Exam Triage Vital Signs ED Triage Vitals  Encounter Vitals Group     BP 12/31/23 1235 (!) 121/55     Girls Systolic BP Percentile --      Girls Diastolic BP Percentile --      Boys Systolic BP Percentile --      Boys Diastolic BP Percentile --      Pulse Rate 12/31/23 1235 73     Resp 12/31/23 1235 20     Temp 12/31/23 1235 98.1 F (36.7 C)     Temp src --      SpO2 12/31/23 1235 94 %     Weight --      Height --      Head Circumference --      Peak Flow --      Pain Score 12/31/23 1231 10     Pain Loc --      Pain Education --      Exclude from Growth Chart --    No data found.  Updated Vital Signs BP (!) 121/55   Pulse 73   Temp 98.1 F (36.7 C)   Resp 20   SpO2 94%   Visual  Acuity Right Eye Distance:   Left Eye Distance:   Bilateral Distance:    Right Eye Near:   Left Eye Near:    Bilateral Near:     Physical Exam Vitals and nursing note reviewed.  Constitutional:      Appearance: She is not ill-appearing or toxic-appearing.  HENT:     Head: Normocephalic and atraumatic.     Right Ear: Hearing and external ear normal.     Left Ear: Hearing and external ear normal.     Nose: Nose normal.     Mouth/Throat:     Lips: Pink.  Eyes:     General: Lids are normal. Vision grossly intact. Gaze aligned appropriately.     Extraocular Movements: Extraocular movements intact.     Conjunctiva/sclera: Conjunctivae normal.  Neck:     Comments: Mildly limited range of motion with right lateral neck extension.  Sensation normal to the face.  No signs of cranial nerve abnormality. Pulmonary:     Effort: Pulmonary effort is normal.  Musculoskeletal:     Cervical back: Neck supple. Pain with movement and muscular tenderness (Tender multiple points of palpation over the right cervical paraspinous muscles and the right trapezius musculature.) present. No spinous process tenderness.  Skin:    General: Skin is warm and dry.     Capillary Refill: Capillary refill takes less than 2 seconds.     Findings: No rash.  Neurological:     General: No focal deficit present.     Mental Status: She is alert and oriented to person, place, and time. Mental status is at baseline.     Cranial Nerves: No dysarthria or facial asymmetry.  Psychiatric:        Mood and Affect: Mood normal.        Speech: Speech normal.        Behavior: Behavior normal.        Thought Content: Thought content normal.        Judgment: Judgment normal.      UC Treatments / Results  Labs (all labs ordered are listed, but only abnormal results are displayed) Labs Reviewed - No data to display  EKG   Radiology No  results found.  Procedures Procedures (including critical care time)  Medications  Ordered in UC Medications  dexamethasone  (DECADRON ) injection 5 mg (has no administration in time range)    Initial Impression / Assessment and Plan / UC Course  I have reviewed the triage vital signs and the nursing notes.  Pertinent labs & imaging results that were available during my care of the patient were reviewed by me and considered in my medical decision making (see chart for details).   1.  Acute pain of right shoulder, cervical paraspinal muscle spasm Presentation consistent with acute muscle spasm of the cervical paraspinous musculature leading to pain of the right shoulder. Low suspicion for acute bony abnormality given atraumatic mechanism of injury.  Reviewed recent cervical spine x-ray performed in October 2025 showing degenerative changes at C4-C5 and C5-C6. Patient is not a candidate for NSAIDs or oral airway aids due to history of hypertension, CHF, paroxysmal atrial fibrillation, and diabetes. I would like to give her 5 mg dexamethasone  IM to treat acute inflammation today. She may continue taking Tylenol  1000 mg every 6 hours as needed for pain at home. We discussed the importance of nonpharmacologic methods of pain relief such as gentle range of motion stretches, heat, and massage to prevent stiffness of the neck. Recommend follow-up with orthopedics as needed for new or worsening symptoms or if symptoms fail to improve in the next 3 to 4 days via walking referral.  Information given for [facility] on paperwork.  Counseled patient on potential for adverse effects with medications prescribed/recommended today, strict ER and return-to-clinic precautions discussed, patient verbalized understanding.    Final Clinical Impressions(s) / UC Diagnoses   Final diagnoses:  Acute pain of right shoulder  Cervical paraspinal muscle spasm     Discharge Instructions      We gave you an injection of steroid today which will help reduce inflammation to your  shoulder/neck.  Continue tylenol  1,000mg  every 6 hours as needed for pain.  Apply heat 20 minutes on 20 minutes off to the right shoulder/neck and perform gentle exercises/stretches to the neck to relieve stiffness and pain.   Follow-up with the orthopedic provider listed on your paperwork if your pain does not improve in the next 3-4 days.   If you develop any new or worsening symptoms or if your symptoms do not start to improve, please return here or follow-up with your primary care provider. If your symptoms are severe, please go to the emergency room.     ED Prescriptions   None    PDMP not reviewed this encounter.    [1]  Social History Tobacco Use   Smoking status: Never   Smokeless tobacco: Current    Types: Snuff   Tobacco comments:    3 dips per day  Vaping Use   Vaping status: Never Used  Substance Use Topics   Alcohol use: No   Drug use: No     Enedelia Dorna HERO, FNP 12/31/23 1340

## 2023-12-31 NOTE — ED Triage Notes (Signed)
 PT reports she was helping her son put wood on her front porch . The RT shoulder started to hurt a couple days later.. Pt reports she has pain when wearing a bar and when she moves her shoulder.

## 2024-01-11 ENCOUNTER — Encounter: Payer: Self-pay | Admitting: Cardiovascular Disease

## 2024-01-17 ENCOUNTER — Ambulatory Visit: Attending: Cardiovascular Disease

## 2024-01-17 DIAGNOSIS — I5042 Chronic combined systolic (congestive) and diastolic (congestive) heart failure: Secondary | ICD-10-CM

## 2024-01-17 DIAGNOSIS — Z95810 Presence of automatic (implantable) cardiac defibrillator: Secondary | ICD-10-CM | POA: Diagnosis not present

## 2024-01-19 ENCOUNTER — Encounter: Payer: Self-pay | Admitting: Orthopedic Surgery

## 2024-01-19 ENCOUNTER — Ambulatory Visit: Admitting: Orthopedic Surgery

## 2024-01-19 DIAGNOSIS — G5602 Carpal tunnel syndrome, left upper limb: Secondary | ICD-10-CM

## 2024-01-19 DIAGNOSIS — M7551 Bursitis of right shoulder: Secondary | ICD-10-CM | POA: Diagnosis not present

## 2024-01-19 DIAGNOSIS — M654 Radial styloid tenosynovitis [de Quervain]: Secondary | ICD-10-CM | POA: Diagnosis not present

## 2024-01-19 MED ORDER — METHYLPREDNISOLONE ACETATE 40 MG/ML IJ SUSP
40.0000 mg | Freq: Once | INTRAMUSCULAR | Status: AC
  Administered 2024-01-19: 40 mg via INTRA_ARTICULAR

## 2024-01-19 NOTE — Progress Notes (Signed)
" ° °  Patient: Diana Campbell           Date of Birth: [birth year]           MRN: [id number] Visit Date: 01/19/2024 Requested by: No referring provider defined for this encounter. PCP: Patient, No Pcp Per  Encounter Diagnoses  Name Primary?   Carpal tunnel syndrome, left upper limb s/p release 12/07/23 Yes   De Quervain's disease (radial styloid tenosynovitis) LEFT    Bursitis of right shoulder     Assessment and plan:  78 year old female with acute bursitis right shoulder, status post left carpal tunnel release with decreased pain and some residual stiffness, extensor compartment tenosynovitis de Quervain's syndrome  Recommend splinting left thumb, injection and sling right shoulder  Recheck 4 weeks.  Meds ordered this encounter  Medications   methylPREDNISolone  acetate (DEPO-MEDROL ) injection 40 mg    Procedure note the subacromial injection shoulder RIGHT    Verbal consent was obtained to inject the  RIGHT   Shoulder  Timeout was completed to confirm the injection site is a subacromial space of the  RIGHT  shoulder   Medication used Depo-Medrol  40 mg and lidocaine  1% 3 cc  Anesthesia was provided by ethyl chloride  The injection was performed in the RIGHT  posterior subacromial space. After pinning the skin with alcohol and anesthetized the skin with ethyl chloride the subacromial space was injected using a 20-gauge needle. There were no complications  Sterile dressing was applied.    Chief Complaint  Patient presents with   Post-op Follow-up    12/07/23 left CTR    History:  Diana Campbell came in today for follow-up regarding her left carpal tunnel release.  She is having some pain in her left thumb and some mild pain over the carpal tunnel incision.  She says the pain in her wrist is much less but she is having some stiffness and trouble getting her hand into a full fist  She also alerts as to the fact that she is having pain in the left first extensor compartment  and acute onset of pain about 2 months ago in the right shoulder with no history of trauma    Focused exam findings:  Left hand carpal tunnel incision healed mild tenderness no pain with wrist flexion extension.  First extensor compartment tenderness positive Finkelstein's test.  Right shoulder painful abduction and painful flexion at 120 degrees of motion passively she is tender over the anterior joint line and rotator interval and she has weakness when trying to elevate her arm to a horizontal position    "

## 2024-01-19 NOTE — Progress Notes (Signed)
" ° ° °  01/19/2024   Chief Complaint  Patient presents with   Post-op Follow-up    12/07/23 left CTR    Encounter Diagnosis  Name Primary?   Carpal tunnel syndrome, left upper limb s/p release 12/07/23 Yes    What pharmacy do you use ? _____[facility]  DOI/DOS/ Date: 12/07/23  Did you get better, worse or no change (Answer below)   Unchanged Painful to carry pocket book; is weak and painful      "

## 2024-01-19 NOTE — Progress Notes (Signed)
 EPIC Encounter for ICM Monitoring  Patient Name: Diana Campbell is a 78 y.o. female Date: 01/19/2024 Primary Care Physican: Patient, No Pcp Per Primary Cardiologist: Burnard Electrophysiologist: Croitoru 12/23/2022 Office Weight: 202 lbs 03/08/2023 Office Weight: 200 lbs 05/25/2023 Weight: 200 lbs at home 06/10/2023 Office Weight: 187 lbs 08/11/2023 Office Weight: 190 lbs 11/23/2023 Office Weight: 192 lbs   Clinical Status Since 06-Dec-2023 Time in AT/AF      <0.1 hr/day (<0.1%)  Longest AT/AF  52 seconds                                                Transmission results reviewed.    Since 12/06/2023 ICM Remote Transmission: Optivol thoracic impedance suggesting normal fluid levels with the exception of possible fluid accumulation from 01/10/2024-01/14/2024.   Prescribed:  No diuretic (Previously on Furosemide  40 mg 1 tablet as needed)   Labs: 12/03/2023 Creatinine 0.83, BUN 12, Potassium 3.0, Sodium 142, GFR >60 11/08/2023 Creatinine 1.05, BUN 15, Potassium 3.6, Sodium 144 06/29/2023 Creatinine 0.98, BUN 11, Potassium 3.7, Sodium 143, GFR 59 03/02/2023 Creatinine 0.87, BUN 10, Potassium 3.4, Sodium 146, GFR 68 A complete set of results can be found in Results Review.   Recommendations:  No changes.   Follow-up plan: ICM clinic phone appointment on 02/21/2024.   91 day device clinic remote transmission 01/26/2024.     EP/Cardiology Office Visits:  Recall 06/09/2024 with Dr Francyne.   Missed 12/21/2023 with Dr Darron (yearly) and has not rescheduled.   Copy of ICM check sent to Dr. Francyne.     Remote monitoring is medically necessary for Heart Failure Management.    Daily Thoracic Impedance ICM trend: 01/17/2024 through 01/17/2024.    12-14 Month Thoracic Impedance ICM trend:     [name], RN 01/19/2024 8:30 AM

## 2024-01-26 ENCOUNTER — Ambulatory Visit

## 2024-01-26 DIAGNOSIS — I5042 Chronic combined systolic (congestive) and diastolic (congestive) heart failure: Secondary | ICD-10-CM

## 2024-01-27 LAB — CUP PACEART REMOTE DEVICE CHECK
Battery Remaining Longevity: 30 mo
Battery Voltage: 2.94 V
Brady Statistic AP VP Percent: 0.03 %
Brady Statistic AP VS Percent: 84.54 %
Brady Statistic AS VP Percent: 0 %
Brady Statistic AS VS Percent: 15.43 %
Brady Statistic RA Percent Paced: 83.95 %
Brady Statistic RV Percent Paced: 0.04 %
Date Time Interrogation Session: 20260107033522
HighPow Impedance: 47 Ohm
HighPow Impedance: 61 Ohm
Implantable Lead Connection Status: 753985
Implantable Lead Connection Status: 753985
Implantable Lead Implant Date: 20120702
Implantable Lead Implant Date: 20120702
Implantable Lead Location: 753859
Implantable Lead Location: 753860
Implantable Lead Model: 5076
Implantable Lead Model: 6947
Implantable Pulse Generator Implant Date: 20191011
Lead Channel Impedance Value: 342 Ohm
Lead Channel Impedance Value: 437 Ohm
Lead Channel Impedance Value: 494 Ohm
Lead Channel Pacing Threshold Amplitude: 0.75 V
Lead Channel Pacing Threshold Amplitude: 0.875 V
Lead Channel Pacing Threshold Pulse Width: 0.4 ms
Lead Channel Pacing Threshold Pulse Width: 0.4 ms
Lead Channel Sensing Intrinsic Amplitude: 10.25 mV
Lead Channel Sensing Intrinsic Amplitude: 10.25 mV
Lead Channel Sensing Intrinsic Amplitude: 2.375 mV
Lead Channel Sensing Intrinsic Amplitude: 2.375 mV
Lead Channel Setting Pacing Amplitude: 1.5 V
Lead Channel Setting Pacing Amplitude: 2 V
Lead Channel Setting Pacing Pulse Width: 0.4 ms
Lead Channel Setting Sensing Sensitivity: 0.3 mV
Zone Setting Status: 755011
Zone Setting Status: 755011

## 2024-01-31 NOTE — Progress Notes (Signed)
 Remote ICD Transmission

## 2024-02-01 ENCOUNTER — Ambulatory Visit: Payer: Self-pay | Admitting: Cardiovascular Disease

## 2024-02-07 ENCOUNTER — Telehealth: Payer: Self-pay

## 2024-02-07 NOTE — Telephone Encounter (Signed)
 Attempted return call to patient as requested by voice mail message.  Left message with number for call back.

## 2024-02-08 ENCOUNTER — Telehealth: Payer: Self-pay | Admitting: Cardiovascular Disease

## 2024-02-08 NOTE — Telephone Encounter (Signed)
 Pt c/o Shortness Of Breath: STAT if SOB developed within the last 24 hours or pt is noticeably SOB on the phone  1. Are you currently SOB (can you hear that pt is SOB on the phone)? No   2. How long have you been experiencing SOB? Couple months, but symptoms are worsening   3. Are you SOB when sitting or when up moving around? Up and moving around   4. Are you currently experiencing any other symptoms? Pt has been experiencing episodes of severe fatigue and sob when doing normal activities. She is very concerned. Please advise.

## 2024-02-08 NOTE — Telephone Encounter (Signed)
 She does monthly Optivol downloads, most recently 01/20/2024 that have not shown evidence of volume overload. While this is not a perfect tool, I think it makes it less likely that symptoms going on for 2 months are due to CHF. Will ask our HF device nurse Mitzie Garner to please do another download before the 01/28 appointment to get updated info. Thanks

## 2024-02-08 NOTE — Telephone Encounter (Signed)
 Patient reports for the past couple of months she has been experiencing SOB and fatigue, but they have progressively gotten worse.  She gets very tired and winded performing her daily activities.  She denies any CP or SOB at rest. She requested appt for evaluation on 02/16/24. Offered appt with DOD Dr. Kriste, she accepted.

## 2024-02-09 NOTE — Telephone Encounter (Signed)
 Returned call to patient as requested by voice mail message.  She was unable to hear what the [employer] tech was saying and not sure what to do.  Advised I will call [facility] Tech Support to see check if a scheduled call her to her can be done.

## 2024-02-09 NOTE — Telephone Encounter (Signed)
 Pt returned call and attempted to assist with sending manual remote transmission.  Pt received error code for battery.  Provided [facility] tech support number and advised to call and may need a replacement hand piece.

## 2024-02-09 NOTE — Telephone Encounter (Signed)
 Spoke with patient.  Advised I called the [facility] Support number and it is an automated process to troubleshoot monitor problems and unable to get a live representative.  Advised ordered a new monitor for her through [employer] and she should get it in 7-10 days.  Verified her home address to ship monitor.  She appreciated the assistance.  Advised also updated Dr Francyne that unable to get a remote transmission.  Pt confirmed and aware she has Cardiology appt on 02/16/2024 to address her sx.

## 2024-02-09 NOTE — Telephone Encounter (Signed)
Thank you for trying [name].

## 2024-02-09 NOTE — Telephone Encounter (Signed)
 Attempted call to [facility] number and unable to speak with live representative.  Tech support is automated.   Dr Francyne, I am not able to provide a remote transmission today due to error on home monitor and unable to get through to tech support at this time. The error code is likely that the hand held reader will need replacing.  I will continue to attempt to get through to tech support to provide assistance.  Pt unable to go through the steps of automated troubleshooting at this time.

## 2024-02-09 NOTE — Telephone Encounter (Signed)
 Attempted call to patient to request manual remote transmission to check fluid levels.  Left message to return call with ICM number.

## 2024-02-16 ENCOUNTER — Ambulatory Visit: Admitting: Orthopedic Surgery

## 2024-02-16 ENCOUNTER — Ambulatory Visit (INDEPENDENT_AMBULATORY_CARE_PROVIDER_SITE_OTHER): Payer: Self-pay

## 2024-02-16 ENCOUNTER — Encounter: Payer: Self-pay | Admitting: Orthopedic Surgery

## 2024-02-16 ENCOUNTER — Encounter: Payer: Self-pay | Admitting: Cardiovascular Disease

## 2024-02-16 ENCOUNTER — Ambulatory Visit: Admitting: Internal Medicine

## 2024-02-16 ENCOUNTER — Other Ambulatory Visit: Payer: Self-pay

## 2024-02-16 ENCOUNTER — Ambulatory Visit: Admitting: Cardiovascular Disease

## 2024-02-16 VITALS — BP 122/72 | HR 64 | Ht 67.0 in | Wt 187.8 lb

## 2024-02-16 DIAGNOSIS — I428 Other cardiomyopathies: Secondary | ICD-10-CM

## 2024-02-16 DIAGNOSIS — M654 Radial styloid tenosynovitis [de Quervain]: Secondary | ICD-10-CM

## 2024-02-16 DIAGNOSIS — I10 Essential (primary) hypertension: Secondary | ICD-10-CM | POA: Diagnosis not present

## 2024-02-16 DIAGNOSIS — G5602 Carpal tunnel syndrome, left upper limb: Secondary | ICD-10-CM

## 2024-02-16 DIAGNOSIS — E782 Mixed hyperlipidemia: Secondary | ICD-10-CM | POA: Diagnosis not present

## 2024-02-16 DIAGNOSIS — M7551 Bursitis of right shoulder: Secondary | ICD-10-CM | POA: Diagnosis not present

## 2024-02-16 DIAGNOSIS — M79645 Pain in left finger(s): Secondary | ICD-10-CM

## 2024-02-16 DIAGNOSIS — I5042 Chronic combined systolic (congestive) and diastolic (congestive) heart failure: Secondary | ICD-10-CM

## 2024-02-16 DIAGNOSIS — Z95810 Presence of automatic (implantable) cardiac defibrillator: Secondary | ICD-10-CM

## 2024-02-16 MED ORDER — METHYLPREDNISOLONE ACETATE 40 MG/ML IJ SUSP
40.0000 mg | Freq: Once | INTRAMUSCULAR | Status: AC
  Administered 2024-02-16: 40 mg via INTRA_ARTICULAR

## 2024-02-16 NOTE — Assessment & Plan Note (Signed)
 History of essential hypertension blood pressure measured today at 122/72.  She is on low-dose carvedilol  and Entresto .

## 2024-02-16 NOTE — Progress Notes (Signed)
" ° ° °  02/16/2024   Chief Complaint  Patient presents with   Post-op Follow-up    CTR 12/07/23    Encounter Diagnoses  Name Primary?   Bursitis of right shoulder Yes   De Quervain's disease (radial styloid tenosynovitis) LEFT    Carpal tunnel syndrome, left upper limb s/p release 12/07/23     [facility]  DOI/DOS/ Date: 12/07/23    "

## 2024-02-16 NOTE — Progress Notes (Signed)
 "     02/16/2024 Diana Campbell   [birth year]  [id number]  Primary Physician Patient, No Pcp Per Primary Cardiologist: Dorn Diana Lesches MD [facility], [place], [place]  HPI:  Diana Campbell is a 79 y.o. thin-appearing widowed African-American female mother of 4 children, grandmother of 9 grandchildren is accompanied by one of her daughters Diana Campbell today.  She was previously a patient of Dr. Burnard who is since retired.  Dr. Francyne follows her implantable defibrillator.  She does have a history of diabetes and treated hyperlipidemia.  She has never had heart attack or stroke.  She did have a witnessed cardiac arrest June 2005 secondary to VF and has documented nonischemic cardiomyopathy with minimal nonobstructive coronary disease by angiography.  She has had an ICD placed (Medtronic defibrillator) followed by Dr. Francyne.  Her last echo performed 11/20/2022 revealed EF of 30 to 35% with grade 1 diastolic dysfunction.  She saw Dr. Francyne in the office 06/10/2023 complaining mostly of fatigue with very little shortness of breath.  Her volume status is checked remotely by OptiVol and she gets as needed furosemide .  Again her major complaint today is of fatigue.   Active Medications[1]   Allergies[2]  Social History   Socioeconomic History   Marital status: Married    Spouse name: Not on file   Number of children: Not on file   Years of education: Not on file   Highest education level: Not on file  Occupational History   Not on file  Tobacco Use   Smoking status: Never   Smokeless tobacco: Current    Types: Snuff   Tobacco comments:    3 dips per day  Vaping Use   Vaping status: Never Used  Substance and Sexual Activity   Alcohol use: No   Drug use: No   Sexual activity: Yes    Birth control/protection: None  Other Topics Concern   Not on file  Social History Narrative   Not on file   Social Drivers of Health   Tobacco Use: High Risk (02/16/2024)   Patient History    Smoking  Tobacco Use: Never    Smokeless Tobacco Use: Current    Passive Exposure: Not on file  Financial Resource Strain: Not on file  Food Insecurity: Low Risk (11/04/2023)   Received from [facility]   Epic    Within the past 12 months, you worried that your food would run out before you got money to buy more: Never true    Within the past 12 months, the food you bought just didn't last and you didn't have money to get more. : Never true  Transportation Needs: No Transportation Needs (11/04/2023)   Received from [facility]    In the past 12 months, has lack of reliable transportation kept you from medical appointments, meetings, work or from getting things needed for daily living? : No  Physical Activity: Not on file  Stress: Not on file  Social Connections: Not on file  Intimate Partner Violence: Not on file  Depression (EYV7-0): Not on file  Alcohol Screen: Not on file  Housing: Low Risk (11/04/2023)   Received from [facility]   Epic    What is your living situation today?: I have a steady place to live    Think about the place you live. Do you have problems with any of the following? Choose all that apply:: None/None on this list  Utilities: Low Risk (11/04/2023)   Received  from [facility]   Utilities    In the past 12 months has the electric, gas, oil, or water  company threatened to shut off services in your home? : No  Health Literacy: Not on file     Review of Systems: General: negative for chills, fever, night sweats or weight changes.  Cardiovascular: negative for chest pain, dyspnea on exertion, edema, orthopnea, palpitations, paroxysmal nocturnal dyspnea or shortness of breath Dermatological: negative for rash Respiratory: negative for cough or wheezing Urologic: negative for hematuria Abdominal: negative for nausea, vomiting, diarrhea, bright red blood per rectum, melena, or hematemesis Neurologic: negative for visual changes, syncope, or  dizziness All other systems reviewed and are otherwise negative except as noted above.    Blood pressure 122/72, pulse 64, height 5' 7 (1.702 m), weight 187 lb 12.8 oz (85.2 kg), SpO2 98%.  General appearance: alert and no distress Neck: no adenopathy, no carotid bruit, no JVD, supple, symmetrical, trachea midline, and thyroid  not enlarged, symmetric, no tenderness/mass/nodules Lungs: clear to auscultation bilaterally Heart: regular rate and rhythm, S1, S2 normal, no murmur, click, rub or gallop Extremities: extremities normal, atraumatic, no cyanosis or edema Pulses: 2+ and symmetric Skin: Skin color, texture, turgor normal. No rashes or lesions Neurologic: Grossly normal  EKG EKG Interpretation Date/Time:  Wednesday February 16 2024 11:39:50 EST Ventricular Rate:  64 PR Interval:  182 QRS Duration:  134 QT Interval:  476 QTC Calculation: 491 R Axis:   -26  Text Interpretation: Atrial-paced rhythm with Premature ventricular complexes or Fusion complexes Non-specific intra-ventricular conduction block Cannot rule out Septal infarct (cited on or before 16-Feb-2024) T wave abnormality, consider anterolateral ischemia When compared with ECG of 23-Dec-2022 09:53, Fusion complexes are now Present Premature ventricular complexes are now Present QRS axis Shifted left Criteria for Lateral infarct are no longer Present Serial changes of Septal infarct Present Confirmed by Court Carrier [phone]) on 02/16/2024 11:48:02 AM    ASSESSMENT AND PLAN:   Cardiomyopathy, nonischemic (HCC) History of nonischemic cardiomyopathy with minor nonobstructive coronary disease by cath and echo performed 11/20/2022 revealing EF of 30 to 35% with global hypokinesia and mild LV dilatation, and grade 1 diastolic dysfunction with normal valvular function.  She has had an ICD placed because of an episode of VF arrest back in June 2005 followed by Dr. Francyne.  She has OptiVol measurements periodically and gets as needed  furosemide .  She is on GDMT.  Mixed hyperlipidemia History of hyperlipidemia on atorvastatin  lipid profile performed 11/08/2023 revealing total cholesterol 145, LDL 78 and HDL 64.  Essential hypertension, benign History of essential hypertension blood pressure measured today at 122/72.  She is on low-dose carvedilol  and Entresto .     Carrier DOROTHA Court MD FACP,FACC,FAHA, FSCAI 02/16/2024 12:02 PM    [1]  Current Meds  Medication Sig   ACCU-CHEK FASTCLIX LANCETS MISC 1 each by Does not apply route 2 (two) times daily.   ACCU-CHEK GUIDE test strip USE AS INSTRUCTED TO TEST BLOOD GLUCOSE FOUR TIMES DAILY   acetaminophen  (TYLENOL ) 325 MG tablet Take 650 mg by mouth 3 (three) times daily as needed (pain/headaches.).   aspirin  EC 81 MG tablet Take 1 tablet (81 mg total) by mouth daily.   atorvastatin  (LIPITOR) 20 MG tablet Take 1 tablet (20 mg total) by mouth daily. (Patient taking differently: Take 20 mg by mouth every evening.)   Blood Glucose Monitoring Suppl (ACCU-CHEK GUIDE) w/Device KIT Use as directed to check blood glucose four times daily   carvedilol  (COREG ) 3.125  MG tablet Take 1 tablet (3.125 mg total) by mouth 2 (two) times daily.   Continuous Glucose Receiver (FREESTYLE LIBRE 3 READER) DEVI 1 Piece by Does not apply route once as needed for up to 1 dose.   Continuous Glucose Sensor (FREESTYLE LIBRE 3 PLUS SENSOR) MISC Change sensor every 15 days.   donepezil (ARICEPT) 5 MG tablet Take 5 mg by mouth in the morning.   Insulin  Asp Prot & Asp FlexPen (NOVOLOG  70/30 MIX) (70-30) 100 UNIT/ML FlexPen Inject 15 units with breakfast and 15 units with supper as directed by provider.   JARDIANCE  10 MG TABS tablet TAKE 1 TABLET BY MOUTH EVERY DAY   Lancets Misc. (ACCU-CHEK FASTCLIX LANCET) KIT 1 each by Does not apply route 2 (two) times daily.   pantoprazole  (PROTONIX ) 20 MG tablet Take 1 tablet (20 mg total) by mouth daily.   pantoprazole  (PROTONIX ) 40 MG tablet Take 40 mg by mouth in  the morning and at bedtime.   sacubitril -valsartan  (ENTRESTO ) 49-51 MG TAKE 1 TABLET BY MOUTH TWICE A DAY  [2] No Known Allergies  "

## 2024-02-16 NOTE — Assessment & Plan Note (Signed)
 History of hyperlipidemia on atorvastatin  lipid profile performed 11/08/2023 revealing total cholesterol 145, LDL 78 and HDL 64.

## 2024-02-16 NOTE — Assessment & Plan Note (Signed)
 History of nonischemic cardiomyopathy with minor nonobstructive coronary disease by cath and echo performed 11/20/2022 revealing EF of 30 to 35% with global hypokinesia and mild LV dilatation, and grade 1 diastolic dysfunction with normal valvular function.  She has had an ICD placed because of an episode of VF arrest back in June 2005 followed by Dr. Francyne.  She has OptiVol measurements periodically and gets as needed furosemide .  She is on GDMT.

## 2024-02-16 NOTE — Patient Instructions (Signed)
 Medication Instructions:  Your physician recommends that you continue on your current medications as directed. Please refer to the Current Medication list given to you today.  *If you need a refill on your cardiac medications before your next appointment, please call your pharmacy*   Follow-Up: At [facility], you and your health needs are our priority.  As part of our continuing mission to provide you with exceptional heart care, our providers are all part of one team.  This team includes your primary Cardiologist (physician) and Advanced Practice Providers or APPs (Physician Assistants and Nurse Practitioners) who all work together to provide you with the care you need, when you need it.  Your next appointment:   6 month(s)  Provider:   Georganna Archer, MD    We recommend signing up for the patient portal called MyChart.  Sign up information is provided on this After Visit Summary.  MyChart is used to connect with patients for Virtual Visits (Telemedicine).  Patients are able to view lab/test results, encounter notes, upcoming appointments, etc.  Non-urgent messages can be sent to your provider as well.   To learn more about what you can do with MyChart, go to [email or web address].   Other Instructions

## 2024-02-16 NOTE — Progress Notes (Signed)
 FOLLOW-UP OFFICE VISIT   Patient: Diana Campbell           Date of Birth: 1945-02-03           MRN: [id number] Visit Date: 02/16/2024 Requested by: No referring provider defined for this encounter. PCP: Patient, No Pcp Per    Encounter Diagnoses  Name Primary?   Bursitis of right shoulder Yes   De Quervain's disease (radial styloid tenosynovitis) LEFT    Carpal tunnel syndrome, left upper limb s/p release 12/07/23    Thumb pain, left     Chief Complaint  Patient presents with   Post-op Follow-up    CTR 12/07/23    Diana Campbell had a carpal tunnel release on November 24 got good relief of her symptoms except for the fingertips, she did have some pain in the central portion of the hand yesterday but nothing before that  We injected her right shoulder for presumed bursitis she did not improve  She still having pain in her left thumb near the base of the joint and over the first extensor compartment  Reexamination reveals tenderness at the [facility] joint as well as the first extensor compartment and a positive Finkelstein's test  X-rays were obtained  DG Finger Thumb Left Result Date: 02/16/2024 Report of images taking of the left thumb patient having pain in the base of the left thumb and also over the first extensor compartment For the most part the hand looks normal maybe a little osteopenic mild degenerative changes in the [facility] joint Impression mild OA CMC joint      ASSESSMENT AND PLAN  #1 right shoulder pain refractory to injection  #2 continued pain in the left thumb with CMC arthritis and de Quervain's syndrome -- Injection was performed in the [facility] joint  Follow-up in 3 weeks to readdress the right shoulder and to see if the injection helped her thumb   Procedure note injection left thumb  Injection left CMC joint Medication Depo-Medrol  40 mg and lidocaine  1% 2 cc The patient gave verbal consent Timeout confirmed the site of injection left CMC joint  Alcohol and  ethyl chloride was used to repair the skin and then a 25-gauge needle was used to inject the [facility] joint of the left thumb  There were no complications a sterile Band-Aid was applied

## 2024-02-17 ENCOUNTER — Telehealth: Payer: Self-pay

## 2024-02-17 ENCOUNTER — Other Ambulatory Visit: Payer: Self-pay | Admitting: Student in an Organized Health Care Education/Training Program

## 2024-02-17 NOTE — Telephone Encounter (Signed)
 Copied from CRM [id number]. Topic: Clinical - Medication Question >> Feb 17, 2024 12:06 PM Jasmin G wrote: Reason for CRM: Pt would like to see if her upcoming PCP, Ms. Aletha could refill donepezil (ARICEPT) 5 MG tablet since she's out of med. Call pt back at [phone] to discuss.

## 2024-02-17 NOTE — Telephone Encounter (Signed)
 Returned call to patient as requested per voice mail. Pt wanted to know who to call to get a Aricept refill.  Advised to call PCP to get refill.   Discussed also calling Dr Croitoru's office if she has any trouble with fluid symptoms.  Explained her remote transmissions will be reviewed every 3 months instead of monthly. She verbalized understanding

## 2024-02-17 NOTE — Progress Notes (Signed)
 31 day ICM Remote transmission canceled due to [facility] clinic is on hold until further notice.  91 day remote monitoring will continue per protocol.

## 2024-02-18 ENCOUNTER — Encounter: Payer: Self-pay | Admitting: Family Medicine

## 2024-02-18 ENCOUNTER — Ambulatory Visit: Admitting: Family Medicine

## 2024-02-18 VITALS — BP 120/72 | HR 85 | Ht 67.0 in | Wt 185.0 lb

## 2024-02-18 DIAGNOSIS — Z7689 Persons encountering health services in other specified circumstances: Secondary | ICD-10-CM

## 2024-02-18 DIAGNOSIS — E119 Type 2 diabetes mellitus without complications: Secondary | ICD-10-CM

## 2024-02-18 DIAGNOSIS — Z794 Long term (current) use of insulin: Secondary | ICD-10-CM

## 2024-02-18 DIAGNOSIS — E782 Mixed hyperlipidemia: Secondary | ICD-10-CM | POA: Diagnosis not present

## 2024-02-18 DIAGNOSIS — I10 Essential (primary) hypertension: Secondary | ICD-10-CM

## 2024-02-18 DIAGNOSIS — R413 Other amnesia: Secondary | ICD-10-CM | POA: Diagnosis not present

## 2024-02-18 DIAGNOSIS — I428 Other cardiomyopathies: Secondary | ICD-10-CM

## 2024-02-18 DIAGNOSIS — I480 Paroxysmal atrial fibrillation: Secondary | ICD-10-CM

## 2024-02-18 MED ORDER — DONEPEZIL HCL 5 MG PO TABS: 5.0000 mg | ORAL_TABLET | Freq: Every morning | ORAL | 1 refills | Status: AC

## 2024-02-18 NOTE — Progress Notes (Signed)
 "  Patient Office Visit  Assessment & Plan:  Encounter to establish care  Type 2 diabetes mellitus without complication, with long-term current use of insulin  (HCC) -     Ambulatory referral to Ophthalmology -     Hemoglobin A1c -     Microalbumin / creatinine urine ratio  Memory changes -     Donepezil  HCl; Take 1 tablet (5 mg total) by mouth in the morning.  Dispense: 90 tablet; Refill: 1  Essential hypertension, benign -     CBC with Differential/Platelet -     Comprehensive metabolic panel with GFR  PAF (paroxysmal atrial fibrillation) (HCC) -     TSH  Cardiomyopathy, nonischemic (HCC)  Mixed hyperlipidemia -     Lipid panel   Assessment and Plan    Type 2 diabetes mellitus Long-standing diabetes with glucose levels 100-300 mg/dL. Occasional high readings post-sugary drinks. - Ordered blood work for glucose control, kidney, and liver function. - Continue current diabetes medications. - Encouraged reduction of sugary drink consumption.  Nonischemic cardiomyopathy with reduced ejection fraction Ejection fraction 30-35%. Occasional shortness of breath. No new blockages per recent cardiology follow-up. - Continue Entresto  as prescribed. - Encouraged use of a pill box for medication adherence.  Paroxysmal atrial fibrillation Brief episode in 2020. Managed with carvedilol . - Continue carvedilol  as prescribed.  Essential hypertension Managed with carvedilol . - Continue carvedilol  as prescribed.  Mixed hyperlipidemia Managed with Lipitor. Occasional non-adherence due to fluid retention concerns. - Continue Lipitor daily, either in the morning or evening based on preference.  Memory changes Reports memory changes. On Aricept  with some improvement. No formal dementia diagnosis. - Continue Aricept  as prescribed.  General health maintenance Declined tetanus and pneumonia vaccinations. Last eye exam over a year ago. Unclear mammogram records. No recent colonoscopy,  over 67 years old. - Ordered eye exam with ophthalmologist. - Encouraged consideration of pneumonia and shingles vaccinations. - Will schedule follow-up appointments every three months.      Test results were reviewed and analyzed as part of the medical decision making of this visit.  Previous notes from cardiology and lab work done previously during the office visit.  Ophthalmology consult ordered.  Patient declined pneumococcal vaccine. Recommend healthy diet i.e mediterranean/DASH diet, gradual exercise - 30 minutes 5 day per week Return in about 3 months (around 05/18/2024), or if symptoms worsen or fail to improve.   Subjective:    Patient ID: Diana Campbell, female    DOB: 1945-08-21  Age: 79 y.o. MRN: [id number]  Chief Complaint  Patient presents with   Medical Management of Chronic Issues   Establish Care    HPI Discussed the use of AI scribe software for clinical note transcription with the patient, who gave verbal consent to proceed.  History of Present Illness       Diana Campbell is an 79 year old female with diabetes and cardiomyopathy who presents for a follow-up visit/establish primary care in our office  She has a long-standing history of diabetes, managed for over 30 years. Blood sugar levels are checked twice daily, with recent readings sometimes reaching 300 mg/dL, particularly after consuming sugary drinks. She is on Jardiance .  She reports memory issues and has been on Aricept  for approximately three months, which she believes is helping.  She has a history of cardiomyopathy with an ejection fraction of 30-35% and has a defibrillator implanted. She reports some shortness of breath and has a history of ventricular fibrillation and paroxysmal atrial fibrillation. She takes carvedilol  for  blood pressure and Entresto  for heart failure, along with Lipitor for cholesterol, although she sometimes skips it when experiencing ankle swelling.  She has not had an eye exam  in nearly two years. She has not had a recent mammogram and is unsure about her last colonoscopy.  She uses a cane for mobility but has not experienced any recent falls. She does not drive and relies on her daughter for transportation. She is mindful of her diet, trying to limit salt intake, and enjoys cooking vegetables like cabbage and collards.  Physical Exam MEASUREMENTS: Weight- 185.  Results Diagnostic Cardiac catheterization (2024): No significant coronary artery disease; ejection fraction 30-35%  Assessment and Plan Type 2 diabetes mellitus on insulin - patient does see Endocrinology in [place] Long-standing diabetes with glucose levels 100-300 mg/dL. Occasional high readings post-sugary drinks. - Ordered blood work for glucose control, kidney, and liver function. - Continue current diabetes medications. Ophthalmology consult ordered in [place] - Encouraged reduction of sugary drink consumption.  Nonischemic cardiomyopathy with reduced ejection fraction-sees Dr. Court Ejection fraction 30-35%. Occasional shortness of breath. No new blockages per recent cardiology follow-up. - Continue Entresto  as prescribed. - Encouraged use of a pill box for medication adherence.  Paroxysmal atrial fibrillation Brief episode in 2020. Managed with carvedilol . - Continue carvedilol  as prescribed.  Essential hypertension Managed with carvedilol . - Continue carvedilol  as prescribed.  Mixed hyperlipidemia Managed with Lipitor. Occasional non-adherence due to fluid retention concerns. - Continue Lipitor daily, either in the morning or evening based on preference.  Memory changes Reports memory changes. On Aricept  with some improvement. No formal dementia diagnosis. - Continue Aricept  as prescribed.  General health maintenance Declined tetanus and pneumonia vaccinations. Last eye exam over a year ago. Unclear mammogram records. No recent colonoscopy, over 29 years old. - Ordered eye exam with  ophthalmologist. - Encouraged consideration of pneumonia and shingles vaccinations. - Will schedule follow-up appointments every three months.    The ASCVD Risk score (Arnett DK, et al., 2019) failed to calculate for the following reasons:   The 2019 ASCVD risk score is only valid for ages 42 to 7   * - Cholesterol units were assumed  Past Medical History:  Diagnosis Date   Arthritis    Automatic implantable cardioverter-defibrillator in situ 07/2010   Cardiac abnormality    with defibrilator   Chronic combined systolic and diastolic CHF (congestive heart failure) (HCC) 09/04/2015   Diabetes mellitus    H/O cardiac arrest 2006   Hypercholesteremia    Hypertension    Neuropathy    Past Surgical History:  Procedure Laterality Date   CARDIAC CATHETERIZATION  06/22/2003   Severe nonischemic cardiomyopathy, 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   CARDIAC CATHETERIZATION  04/14/2000   Normal coronary arteries.   CARDIAC DEFIBRILLATOR PLACEMENT  07/21/2010   Medtronic Protecta XT DR, model#[id number], serial#[id number]   CARDIOVASCULAR STRESS TEST  04/05/2000   Scintigraphic evidence of mild LV dilatation and global LV dysfunction with an EF 41% with mild inferolateral ischemia.   CARPAL TUNNEL RELEASE Right 01/23/2016   Procedure: CARPAL TUNNEL RELEASE;  Surgeon: Taft FORBES Minerva, MD;  Location: [facility];  Service: Orthopedics;  Laterality: Right;  pt knows to arrive at 6:15   CARPAL TUNNEL RELEASE Left 12/07/2023   Procedure: CARPAL TUNNEL RELEASE;  Surgeon: Minerva Taft FORBES, MD;  Location: [facility];  Service: Orthopedics;  Laterality: Left;   COLONOSCOPY  03/30/2011   Procedure: COLONOSCOPY;  Surgeon:  Oneil DELENA Budge, MD;  Location: [facility];  Service: Gastroenterology;  Laterality: N/A;   DORSAL COMPARTMENT RELEASE Right 08/11/2013   Procedure: RELEASE DORSAL COMPARTMENT (DEQUERVAIN);  Surgeon: Taft FORBES Minerva, MD;  Location: [facility];  Service: Orthopedics;  Laterality: Right;   ICD GENERATOR CHANGEOUT N/A 10/29/2017   Procedure: ICD GENERATOR CHANGEOUT;  Surgeon: Francyne Headland, MD;  Location: [facility];  Service: Cardiovascular;  Laterality: N/A;   TRANSTHORACIC ECHOCARDIOGRAM  08/03/2011   EF 45-50%, mildly reduced LV systolic function.   Social History[1] Family History  Problem Relation Age of Onset   Cancer Brother    Allergies[2]  ROS    Objective:    BP 120/72   Pulse 85   Ht 5' 7 (1.702 m)   Wt 185 lb (83.9 kg)   SpO2 97%   BMI 28.98 kg/m  BP Readings from Last 3 Encounters:  02/18/24 120/72  02/16/24 122/72  12/31/23 (!) 121/55   Wt Readings from Last 3 Encounters:  02/18/24 185 lb (83.9 kg)  02/16/24 187 lb 12.8 oz (85.2 kg)  12/07/23 192 lb 9.6 oz (87.4 kg)    Physical Exam Vitals and nursing note reviewed.  Constitutional:      General: She is not in acute distress.    Appearance: Normal appearance.     Comments: Using cane, comes in on her own  HENT:     Head: Normocephalic.     Right Ear: Tympanic membrane, ear canal and external ear normal.     Left Ear: Tympanic membrane, ear canal and external ear normal.  Eyes:     Extraocular Movements: Extraocular movements intact.     Conjunctiva/sclera: Conjunctivae normal.     Pupils: Pupils are equal, round, and reactive to light.  Cardiovascular:     Rate and Rhythm: Normal rate and regular rhythm.     Heart sounds: Normal heart sounds.  Pulmonary:     Effort: Pulmonary effort is normal.     Breath sounds: Normal breath sounds.  Musculoskeletal:     Right lower leg: No edema.     Left lower leg: No edema.  Neurological:     General: No focal deficit present.     Mental Status: She is alert and oriented to person, place, and time.  Psychiatric:        Mood and Affect: Mood normal.        Behavior: Behavior normal.        Thought Content: Thought content normal.        Judgment:  Judgment normal.      No results found for any visits on 02/18/24.          [1]  Social History Tobacco Use   Smoking status: Never   Smokeless tobacco: Current    Types: Snuff   Tobacco comments:    3 dips per day  Vaping Use   Vaping status: Never Used  Substance Use Topics   Alcohol use: No   Drug use: No  [2] No Known Allergies  "

## 2024-02-21 ENCOUNTER — Ambulatory Visit

## 2024-02-22 ENCOUNTER — Ambulatory Visit: Payer: Self-pay | Admitting: Family Medicine

## 2024-02-22 LAB — CBC WITH DIFFERENTIAL/PLATELET
Absolute Lymphocytes: 3646 {cells}/uL (ref 850–3900)
Absolute Monocytes: 666 {cells}/uL (ref 200–950)
Basophils Absolute: 69 {cells}/uL (ref 0–200)
Basophils Relative: 0.7 %
Eosinophils Absolute: 147 {cells}/uL (ref 15–500)
Eosinophils Relative: 1.5 %
HCT: 43.7 % (ref 35.9–46.0)
Hemoglobin: 13.7 g/dL (ref 11.7–15.5)
MCH: 25.4 pg — ABNORMAL LOW (ref 27.0–33.0)
MCHC: 31.4 g/dL — ABNORMAL LOW (ref 31.6–35.4)
MCV: 81.1 fL — ABNORMAL LOW (ref 81.4–101.7)
MPV: 9.3 fL (ref 7.5–12.5)
Monocytes Relative: 6.8 %
Neutro Abs: 5272 {cells}/uL (ref 1500–7800)
Neutrophils Relative %: 53.8 %
Platelets: 264 10*3/uL (ref 140–400)
RBC: 5.39 Million/uL — ABNORMAL HIGH (ref 3.80–5.10)
RDW: 13.7 % (ref 11.0–15.0)
Total Lymphocyte: 37.2 %
WBC: 9.8 10*3/uL (ref 3.8–10.8)

## 2024-02-22 LAB — COMPREHENSIVE METABOLIC PANEL WITH GFR
AG Ratio: 1.5 (calc) (ref 1.0–2.5)
ALT: 10 U/L (ref 6–29)
AST: 12 U/L (ref 10–35)
Albumin: 4.1 g/dL (ref 3.6–5.1)
Alkaline phosphatase (APISO): 78 U/L (ref 37–153)
BUN: 13 mg/dL (ref 7–25)
CO2: 29 mmol/L (ref 20–32)
Calcium: 9.6 mg/dL (ref 8.6–10.4)
Chloride: 105 mmol/L (ref 98–110)
Creat: 0.91 mg/dL (ref 0.60–0.95)
Globulin: 2.8 g/dL (ref 1.9–3.7)
Glucose, Bld: 205 mg/dL — ABNORMAL HIGH (ref 65–99)
Potassium: 3.6 mmol/L (ref 3.5–5.3)
Sodium: 142 mmol/L (ref 135–146)
Total Bilirubin: 0.5 mg/dL (ref 0.2–1.2)
Total Protein: 6.9 g/dL (ref 6.1–8.1)
eGFR: 64 mL/min/{1.73_m2}

## 2024-02-22 LAB — LIPID PANEL
Cholesterol: 147 mg/dL
HDL: 73 mg/dL
LDL Cholesterol (Calc): 60 mg/dL
Non-HDL Cholesterol (Calc): 74 mg/dL
Total CHOL/HDL Ratio: 2 (calc)
Triglycerides: 65 mg/dL

## 2024-02-22 LAB — HEMOGLOBIN A1C
Hgb A1c MFr Bld: 8.5 % — ABNORMAL HIGH
Mean Plasma Glucose: 197 mg/dL
eAG (mmol/L): 10.9 mmol/L

## 2024-02-22 LAB — TSH: TSH: 0.91 m[IU]/L (ref 0.40–4.50)

## 2024-02-22 LAB — MICROALBUMIN / CREATININE URINE RATIO

## 2024-02-22 MED ORDER — SACUBITRIL-VALSARTAN 49-51 MG PO TABS: 1.0000 | ORAL_TABLET | Freq: Two times a day (BID) | ORAL | 3 refills | Status: AC

## 2024-02-22 NOTE — Telephone Encounter (Signed)
 Labs on 02/18/24 Normal

## 2024-03-08 ENCOUNTER — Ambulatory Visit: Admitting: Orthopedic Surgery

## 2024-04-21 ENCOUNTER — Ambulatory Visit: Admitting: "Endocrinology

## 2024-04-26 ENCOUNTER — Encounter

## 2024-05-17 ENCOUNTER — Ambulatory Visit: Admitting: Family Medicine

## 2024-07-26 ENCOUNTER — Encounter

## 2024-10-25 ENCOUNTER — Encounter
